# Patient Record
Sex: Male | Born: 1955 | Race: White | Hispanic: No | Marital: Married | Smoking: Former smoker
Health system: Southern US, Community
[De-identification: ages and names within clinical notes are randomized; demographics above are authoritative.]

## PROBLEM LIST (undated history)

## (undated) DIAGNOSIS — C189 Malignant neoplasm of colon, unspecified: Secondary | ICD-10-CM

## (undated) HISTORY — PX: ESOPHAGOGASTRODUODENOSCOPY: SHX1529

## (undated) HISTORY — DX: Malignant neoplasm of colon, unspecified: C18.9

---

## 2000-06-17 ENCOUNTER — Inpatient Hospital Stay (HOSPITAL_COMMUNITY): Admission: EM | Admit: 2000-06-17 | Discharge: 2000-06-20 | Payer: Self-pay | Admitting: *Deleted

## 2000-09-05 ENCOUNTER — Ambulatory Visit (HOSPITAL_BASED_OUTPATIENT_CLINIC_OR_DEPARTMENT_OTHER): Admission: RE | Admit: 2000-09-05 | Discharge: 2000-09-05 | Payer: Self-pay | Admitting: *Deleted

## 2001-08-07 ENCOUNTER — Emergency Department (HOSPITAL_COMMUNITY): Admission: EM | Admit: 2001-08-07 | Discharge: 2001-08-07 | Payer: Self-pay

## 2014-07-25 ENCOUNTER — Emergency Department (HOSPITAL_BASED_OUTPATIENT_CLINIC_OR_DEPARTMENT_OTHER): Payer: Self-pay

## 2014-07-25 ENCOUNTER — Emergency Department (HOSPITAL_BASED_OUTPATIENT_CLINIC_OR_DEPARTMENT_OTHER)
Admission: EM | Admit: 2014-07-25 | Discharge: 2014-07-25 | Disposition: A | Discharge status: Home / Self Care | Payer: Self-pay | Attending: Emergency Medicine | Admitting: Emergency Medicine

## 2014-07-25 ENCOUNTER — Encounter (HOSPITAL_BASED_OUTPATIENT_CLINIC_OR_DEPARTMENT_OTHER): Payer: Self-pay

## 2014-07-25 DIAGNOSIS — E785 Hyperlipidemia, unspecified: Secondary | ICD-10-CM | POA: Insufficient documentation

## 2014-07-25 DIAGNOSIS — D649 Anemia, unspecified: Secondary | ICD-10-CM

## 2014-07-25 DIAGNOSIS — M79621 Pain in right upper arm: Secondary | ICD-10-CM

## 2014-07-25 DIAGNOSIS — Z79899 Other long term (current) drug therapy: Secondary | ICD-10-CM | POA: Insufficient documentation

## 2014-07-25 DIAGNOSIS — R52 Pain, unspecified: Secondary | ICD-10-CM

## 2014-07-25 DIAGNOSIS — I10 Essential (primary) hypertension: Secondary | ICD-10-CM | POA: Insufficient documentation

## 2014-07-25 DIAGNOSIS — F419 Anxiety disorder, unspecified: Secondary | ICD-10-CM | POA: Insufficient documentation

## 2014-07-25 DIAGNOSIS — Z720 Tobacco use: Secondary | ICD-10-CM | POA: Insufficient documentation

## 2014-07-25 LAB — HEPATIC FUNCTION PANEL
ALK PHOS: 99 U/L (ref 39–117)
ALT: 11 U/L (ref 0–53)
AST: 17 U/L (ref 0–37)
Albumin: 3.9 g/dL (ref 3.5–5.2)
Total Bilirubin: 0.4 mg/dL (ref 0.3–1.2)
Total Protein: 7.5 g/dL (ref 6.0–8.3)

## 2014-07-25 LAB — CBC WITH DIFFERENTIAL/PLATELET
BASOS PCT: 0 % (ref 0–1)
Basophils Absolute: 0.1 10*3/uL (ref 0.0–0.1)
Eosinophils Absolute: 0.2 10*3/uL (ref 0.0–0.7)
Eosinophils Relative: 2 % (ref 0–5)
HCT: 27.2 % — ABNORMAL LOW (ref 39.0–52.0)
Hemoglobin: 8.2 g/dL — ABNORMAL LOW (ref 13.0–17.0)
LYMPHS ABS: 2 10*3/uL (ref 0.7–4.0)
Lymphocytes Relative: 17 % (ref 12–46)
MCH: 23.4 pg — AB (ref 26.0–34.0)
MCHC: 30.1 g/dL (ref 30.0–36.0)
MCV: 77.5 fL — AB (ref 78.0–100.0)
MONOS PCT: 9 % (ref 3–12)
Monocytes Absolute: 1 10*3/uL (ref 0.1–1.0)
Neutro Abs: 8.4 10*3/uL — ABNORMAL HIGH (ref 1.7–7.7)
Neutrophils Relative %: 72 % (ref 43–77)
Platelets: 483 10*3/uL — ABNORMAL HIGH (ref 150–400)
RBC: 3.51 MIL/uL — ABNORMAL LOW (ref 4.22–5.81)
RDW: 14.9 % (ref 11.5–15.5)
WBC: 11.6 10*3/uL — ABNORMAL HIGH (ref 4.0–10.5)

## 2014-07-25 LAB — BASIC METABOLIC PANEL
Anion gap: 7 (ref 5–15)
BUN: 10 mg/dL (ref 6–23)
CO2: 30 mmol/L (ref 19–32)
Calcium: 8.8 mg/dL (ref 8.4–10.5)
Chloride: 100 mmol/L (ref 96–112)
Creatinine, Ser: 0.73 mg/dL (ref 0.50–1.35)
GFR calc non Af Amer: >90 mL/min (ref >90)
GLUCOSE: 91 mg/dL (ref 70–99)
Potassium: 2.9 mmol/L — ABNORMAL LOW (ref 3.5–5.1)
SODIUM: 137 mmol/L (ref 135–145)

## 2014-07-25 LAB — BRAIN NATRIURETIC PEPTIDE: B Natriuretic Peptide: 93 pg/mL (ref 0.0–100.0)

## 2014-07-25 LAB — TROPONIN I: Troponin I: <0.03 ng/mL (ref <0.031)

## 2014-07-25 MED ORDER — PREDNISONE 50 MG PO TABS: 50.0000 mg | ORAL_TABLET | Freq: Every day | ORAL | Status: AC

## 2014-07-25 MED ORDER — TRAMADOL HCL 50 MG PO TABS: 50.0000 mg | ORAL_TABLET | Freq: Four times a day (QID) | ORAL | Status: DC | PRN

## 2014-07-25 MED ORDER — PREDNISONE 50 MG PO TABS
60.0000 mg | ORAL_TABLET | ORAL | Status: AC
  Administered 2014-07-25: 60 mg via ORAL
  Filled 2014-07-25 (×2): qty 1

## 2014-07-25 MED ORDER — CENTRUM PO CHEW: 1.0000 | CHEWABLE_TABLET | Freq: Every day | ORAL | Status: DC

## 2014-07-25 MED ORDER — POTASSIUM CHLORIDE CRYS ER 20 MEQ PO TBCR
40.0000 meq | EXTENDED_RELEASE_TABLET | Freq: Once | ORAL | Status: AC
  Administered 2014-07-25: 40 meq via ORAL
  Filled 2014-07-25: qty 2

## 2014-07-25 NOTE — ED Provider Notes (Signed)
CSN: [id number]     Arrival date & time 07/25/14  5462 History   First MD Initiated Contact with Patient 07/25/14 0805     Chief Complaint  Patient presents with  . Shortness of Breath     (Consider location/radiation/quality/duration/timing/severity/associated sxs/prior Treatment) HPI Patient presents with concern of ongoing right-sided chest pain.  Symptoms began slightly more than 1 week ago.  Since onset symptoms of been persistent. The pain is focally in the right inferior axilla, nonradiating. The pain is sharp, severe, worse with supine positioning, and also with motion. Mild associated dyspnea, though no near-syncope, syncope, other chest pain, fever, chills. Mild cough occasionally. Patient smokes, and I counseled the patient on the need for smoking cessation.  Patient denies other substantial medical issues. No relief with anything, no medication used thus far.  Past Medical History  Diagnosis Date  . Hypertension   . High cholesterol   . Anxiety    History reviewed. No pertinent past surgical history. No family history on file. History  Substance Use Topics  . Smoking status: Current Every Day Smoker -- 0.50 packs/day    Types: Cigarettes  . Smokeless tobacco: Not on file  . Alcohol Use: Not on file    Review of Systems  Constitutional:       Per HPI, otherwise negative  HENT:       Per HPI, otherwise negative  Respiratory:       Per HPI, otherwise negative  Cardiovascular:       Per HPI, otherwise negative  Gastrointestinal: Negative for vomiting.  Endocrine:       Negative aside from HPI  Genitourinary:       Neg aside from HPI   Musculoskeletal:       Per HPI, otherwise negative  Skin: Negative.   Neurological: Negative for syncope.      Allergies  Review of patient's allergies indicates not on file.  Home Medications   Prior to Admission medications   Medication Sig Start Date End Date Taking? Authorizing Provider  ALPRAZolam Duanne Moron) 1  MG tablet Take 1 mg by mouth 3 (three) times daily.   Yes Historical Provider, MD  atenolol-chlorthalidone (TENORETIC) 100-25 MG per tablet Take 1 tablet by mouth daily.   Yes Historical Provider, MD  lovastatin (MEVACOR) 40 MG tablet Take 40 mg by mouth at bedtime.   Yes Historical Provider, MD  multivitamin-iron-minerals-folic acid (CENTRUM) chewable tablet Chew 1 tablet by mouth daily.   Yes Historical Provider, MD   BP 134/74 mmHg  Pulse 62  Temp(Src) 97.7 F (36.5 C) (Oral)  Resp 18  Ht 5\' 9"  (1.753 m)  Wt 165 lb (74.844 kg)  BMI 24.36 kg/m2  SpO2 100% Physical Exam  Constitutional: He is oriented to person, place, and time. He appears well-developed. No distress.  HENT:  Head: Normocephalic and atraumatic.  Eyes: Conjunctivae and EOM are normal.  Cardiovascular: Normal rate and regular rhythm.   Pulmonary/Chest: Effort normal. No stridor. No respiratory distress.  Minimal chest wall tenderness on the right inferior axilla  Abdominal: He exhibits no distension.  Musculoskeletal: He exhibits no edema.  Neurological: He is alert and oriented to person, place, and time.  Skin: Skin is warm and dry.  Psychiatric: He has a normal mood and affect.  Nursing note and vitals reviewed.   ED Course  Procedures (including critical care time) Labs Review Labs Reviewed  BASIC METABOLIC PANEL - Abnormal; Notable for the following:    Potassium 2.9 (*)  All other components within normal limits  CBC WITH DIFFERENTIAL/PLATELET - Abnormal; Notable for the following:    WBC 11.6 (*)    RBC 3.51 (*)    Hemoglobin 8.2 (*)    HCT 27.2 (*)    MCV 77.5 (*)    MCH 23.4 (*)    Platelets 483 (*)    Neutro Abs 8.4 (*)    All other components within normal limits  BRAIN NATRIURETIC PEPTIDE  TROPONIN I  HEPATIC FUNCTION PANEL    Imaging Review Dg Chest 2 View  07/25/2014   CLINICAL DATA:  Acute shortness of breath with right chest and rib pain for 1 week. lifting injury.  EXAM: CHEST   2 VIEW  COMPARISON:  None.  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 Normal heart size and vascularity. Minor central bronchitic change without focal pneumonia, collapse or consolidation. No edema, effusion or pneumothorax. Trachea midline. No acute osseous finding.  IMPRESSION: Mild central bronchitic change.  Anterior eventration of the right hemidiaphragm  No superimposed acute process.   Electronically Signed   By: Jerilynn Mages.  Shick M.D.   On: 07/25/2014 09:03     EKG Interpretation   Date/Time:  Sunday July 25 2014 08:44:40 EST Ventricular Rate:  61 PR Interval:  148 QRS Duration: 106 QT Interval:  444 QTC Calculation: 446 R Axis:   72 Text Interpretation:  Normal sinus rhythm Normal ECG Sinus rhythm Normal  ECG Confirmed by Carmin Muskrat  MD ([id number]) on 07/25/2014 9:04:19 AM     Pulse oximetry 99% room air normal  10:12 AM Family not present. There was a specific concern of gallbladder pathology, point out that the patient has had pain in the right upper quadrant. Patient states that this pain has been present, slightly different from his initial history. Ultrasound will be performed.   I discussed all findings this far, including anemia, hypokalemia, concern for bronchitis with the patient and his family.  1:36 PM Patient in no distress. I discussed all findings with the patient and his wife.   Gallstones, but no gallbladder infection.  MDM  Patient presents initially with concern of dyspnea, right lower axillary pain. Here the patient is hemodynamically stable, labs are notable for anemia and hypokalemia. Ultrasound demonstrates gallstones, no cholecystitis. Patient had potassium repletion here, received analgesics,fluids. No evidence for heart failure, ACS, occult infection. Pain is secondary to bronchitis, though the cysts from the liver may also be causing some diaphragmatic irritation. Patient was discharged in stable condition to follow-up  with primary care after initiation of therapy for bronchitis, analgesics for his pain.    Carmin Muskrat, MD 07/25/14 1339

## 2014-07-25 NOTE — Discharge Instructions (Signed)
As discussed, today's evaluation has been somewhat reassuring. There is no evidence for life-threatening condition, but you should monitor your condition carefully, wall you arrange appropriate ongoing evaluation with your primary care physician. Several things were demonstrated on today's evaluation.  There is evidence for anemia, likely due to iron deficiency.  There is evidence for hypokalemia. (You should have your blood levels checked with your physician this week.)  There is evidence for bronchitis. ( please take all medication as directed, try to stop smoking.)  There is evidence for gallstones. ( please discussed this with your primary care physician).  Return here for concerning changes in your condition.

## 2014-07-25 NOTE — ED Notes (Signed)
Patient here with 1 week of right side pain points to ribs and shortness of breath with same. Reports that he smokes and denies trauma. No cough or congestion

## 2014-07-27 ENCOUNTER — Encounter: Payer: Self-pay | Admitting: Gastroenterology

## 2014-07-28 ENCOUNTER — Encounter (HOSPITAL_BASED_OUTPATIENT_CLINIC_OR_DEPARTMENT_OTHER): Payer: Self-pay | Admitting: *Deleted

## 2014-07-28 ENCOUNTER — Inpatient Hospital Stay (HOSPITAL_BASED_OUTPATIENT_CLINIC_OR_DEPARTMENT_OTHER)
Admission: EM | Admit: 2014-07-28 | Discharge: 2014-08-16 | DRG: 329 | Disposition: A | Discharge status: Home Health | Payer: Medicaid Other | Attending: Internal Medicine | Admitting: Internal Medicine

## 2014-07-28 ENCOUNTER — Emergency Department (HOSPITAL_BASED_OUTPATIENT_CLINIC_OR_DEPARTMENT_OTHER): Payer: Medicaid Other

## 2014-07-28 DIAGNOSIS — C7989 Secondary malignant neoplasm of other specified sites: Secondary | ICD-10-CM | POA: Diagnosis present

## 2014-07-28 DIAGNOSIS — Z7952 Long term (current) use of systemic steroids: Secondary | ICD-10-CM | POA: Diagnosis not present

## 2014-07-28 DIAGNOSIS — I10 Essential (primary) hypertension: Secondary | ICD-10-CM | POA: Diagnosis present

## 2014-07-28 DIAGNOSIS — K921 Melena: Secondary | ICD-10-CM | POA: Diagnosis not present

## 2014-07-28 DIAGNOSIS — R609 Edema, unspecified: Secondary | ICD-10-CM | POA: Diagnosis present

## 2014-07-28 DIAGNOSIS — Z803 Family history of malignant neoplasm of breast: Secondary | ICD-10-CM

## 2014-07-28 DIAGNOSIS — D62 Acute posthemorrhagic anemia: Secondary | ICD-10-CM | POA: Diagnosis present

## 2014-07-28 DIAGNOSIS — K838 Other specified diseases of biliary tract: Secondary | ICD-10-CM | POA: Diagnosis not present

## 2014-07-28 DIAGNOSIS — F17210 Nicotine dependence, cigarettes, uncomplicated: Secondary | ICD-10-CM | POA: Diagnosis present

## 2014-07-28 DIAGNOSIS — R1011 Right upper quadrant pain: Secondary | ICD-10-CM | POA: Diagnosis present

## 2014-07-28 DIAGNOSIS — Z79891 Long term (current) use of opiate analgesic: Secondary | ICD-10-CM

## 2014-07-28 DIAGNOSIS — Z6824 Body mass index (BMI) 24.0-24.9, adult: Secondary | ICD-10-CM | POA: Diagnosis not present

## 2014-07-28 DIAGNOSIS — C772 Secondary and unspecified malignant neoplasm of intra-abdominal lymph nodes: Secondary | ICD-10-CM | POA: Diagnosis present

## 2014-07-28 DIAGNOSIS — Z720 Tobacco use: Secondary | ICD-10-CM

## 2014-07-28 DIAGNOSIS — Z800 Family history of malignant neoplasm of digestive organs: Secondary | ICD-10-CM

## 2014-07-28 DIAGNOSIS — Z801 Family history of malignant neoplasm of trachea, bronchus and lung: Secondary | ICD-10-CM

## 2014-07-28 DIAGNOSIS — E876 Hypokalemia: Secondary | ICD-10-CM | POA: Diagnosis not present

## 2014-07-28 DIAGNOSIS — J189 Pneumonia, unspecified organism: Secondary | ICD-10-CM | POA: Diagnosis not present

## 2014-07-28 DIAGNOSIS — K859 Acute pancreatitis, unspecified: Secondary | ICD-10-CM | POA: Diagnosis not present

## 2014-07-28 DIAGNOSIS — K5730 Diverticulosis of large intestine without perforation or abscess without bleeding: Secondary | ICD-10-CM | POA: Diagnosis present

## 2014-07-28 DIAGNOSIS — R109 Unspecified abdominal pain: Secondary | ICD-10-CM

## 2014-07-28 DIAGNOSIS — D509 Iron deficiency anemia, unspecified: Secondary | ICD-10-CM | POA: Diagnosis not present

## 2014-07-28 DIAGNOSIS — J449 Chronic obstructive pulmonary disease, unspecified: Secondary | ICD-10-CM | POA: Diagnosis present

## 2014-07-28 DIAGNOSIS — J90 Pleural effusion, not elsewhere classified: Secondary | ICD-10-CM | POA: Diagnosis not present

## 2014-07-28 DIAGNOSIS — K922 Gastrointestinal hemorrhage, unspecified: Secondary | ICD-10-CM | POA: Diagnosis not present

## 2014-07-28 DIAGNOSIS — R0600 Dyspnea, unspecified: Secondary | ICD-10-CM | POA: Diagnosis not present

## 2014-07-28 DIAGNOSIS — J9601 Acute respiratory failure with hypoxia: Secondary | ICD-10-CM | POA: Diagnosis not present

## 2014-07-28 DIAGNOSIS — C189 Malignant neoplasm of colon, unspecified: Secondary | ICD-10-CM

## 2014-07-28 DIAGNOSIS — F329 Major depressive disorder, single episode, unspecified: Secondary | ICD-10-CM | POA: Diagnosis present

## 2014-07-28 DIAGNOSIS — E780 Pure hypercholesterolemia: Secondary | ICD-10-CM | POA: Diagnosis present

## 2014-07-28 DIAGNOSIS — R509 Fever, unspecified: Secondary | ICD-10-CM | POA: Diagnosis not present

## 2014-07-28 DIAGNOSIS — Z7982 Long term (current) use of aspirin: Secondary | ICD-10-CM | POA: Diagnosis not present

## 2014-07-28 DIAGNOSIS — K839 Disease of biliary tract, unspecified: Secondary | ICD-10-CM | POA: Diagnosis not present

## 2014-07-28 DIAGNOSIS — C183 Malignant neoplasm of hepatic flexure: Secondary | ICD-10-CM | POA: Diagnosis not present

## 2014-07-28 DIAGNOSIS — K8020 Calculus of gallbladder without cholecystitis without obstruction: Secondary | ICD-10-CM | POA: Diagnosis present

## 2014-07-28 DIAGNOSIS — D500 Iron deficiency anemia secondary to blood loss (chronic): Secondary | ICD-10-CM | POA: Diagnosis present

## 2014-07-28 DIAGNOSIS — C779 Secondary and unspecified malignant neoplasm of lymph node, unspecified: Secondary | ICD-10-CM | POA: Diagnosis not present

## 2014-07-28 DIAGNOSIS — Z452 Encounter for adjustment and management of vascular access device: Secondary | ICD-10-CM

## 2014-07-28 DIAGNOSIS — K929 Disease of digestive system, unspecified: Secondary | ICD-10-CM | POA: Diagnosis not present

## 2014-07-28 DIAGNOSIS — F411 Generalized anxiety disorder: Secondary | ICD-10-CM | POA: Diagnosis not present

## 2014-07-28 DIAGNOSIS — Y95 Nosocomial condition: Secondary | ICD-10-CM | POA: Diagnosis not present

## 2014-07-28 DIAGNOSIS — C184 Malignant neoplasm of transverse colon: Secondary | ICD-10-CM | POA: Diagnosis present

## 2014-07-28 DIAGNOSIS — J9801 Acute bronchospasm: Secondary | ICD-10-CM | POA: Diagnosis not present

## 2014-07-28 DIAGNOSIS — K9189 Other postprocedural complications and disorders of digestive system: Secondary | ICD-10-CM

## 2014-07-28 DIAGNOSIS — Z79899 Other long term (current) drug therapy: Secondary | ICD-10-CM | POA: Diagnosis not present

## 2014-07-28 DIAGNOSIS — E46 Unspecified protein-calorie malnutrition: Secondary | ICD-10-CM | POA: Diagnosis present

## 2014-07-28 DIAGNOSIS — R1030 Lower abdominal pain, unspecified: Secondary | ICD-10-CM

## 2014-07-28 DIAGNOSIS — Z95828 Presence of other vascular implants and grafts: Secondary | ICD-10-CM

## 2014-07-28 DIAGNOSIS — E785 Hyperlipidemia, unspecified: Secondary | ICD-10-CM | POA: Diagnosis present

## 2014-07-28 DIAGNOSIS — K6389 Other specified diseases of intestine: Secondary | ICD-10-CM | POA: Diagnosis not present

## 2014-07-28 DIAGNOSIS — K769 Liver disease, unspecified: Secondary | ICD-10-CM | POA: Diagnosis not present

## 2014-07-28 LAB — CBC WITH DIFFERENTIAL/PLATELET
Basophils Absolute: 0.1 10*3/uL (ref 0.0–0.1)
Basophils Relative: 0 % (ref 0–1)
EOS ABS: 0.1 10*3/uL (ref 0.0–0.7)
EOS PCT: 1 % (ref 0–5)
HCT: 26.8 % — ABNORMAL LOW (ref 39.0–52.0)
Hemoglobin: 7.9 g/dL — ABNORMAL LOW (ref 13.0–17.0)
Lymphocytes Relative: 9 % — ABNORMAL LOW (ref 12–46)
Lymphs Abs: 1.6 10*3/uL (ref 0.7–4.0)
MCH: 23.1 pg — ABNORMAL LOW (ref 26.0–34.0)
MCHC: 29.5 g/dL — ABNORMAL LOW (ref 30.0–36.0)
MCV: 78.4 fL (ref 78.0–100.0)
MONO ABS: 1.3 10*3/uL — AB (ref 0.1–1.0)
MONOS PCT: 7 % (ref 3–12)
Neutro Abs: 15.6 10*3/uL — ABNORMAL HIGH (ref 1.7–7.7)
Neutrophils Relative %: 83 % — ABNORMAL HIGH (ref 43–77)
PLATELETS: 526 10*3/uL — AB (ref 150–400)
RBC: 3.42 MIL/uL — ABNORMAL LOW (ref 4.22–5.81)
RDW: 15.6 % — ABNORMAL HIGH (ref 11.5–15.5)
WBC: 18.7 10*3/uL — ABNORMAL HIGH (ref 4.0–10.5)

## 2014-07-28 LAB — COMPREHENSIVE METABOLIC PANEL
ALT: 10 U/L (ref 0–53)
AST: 16 U/L (ref 0–37)
Albumin: 3.4 g/dL — ABNORMAL LOW (ref 3.5–5.2)
Alkaline Phosphatase: 78 U/L (ref 39–117)
Anion gap: 5 (ref 5–15)
BILIRUBIN TOTAL: 0.3 mg/dL (ref 0.3–1.2)
BUN: 17 mg/dL (ref 6–23)
CALCIUM: 8.5 mg/dL (ref 8.4–10.5)
CO2: 31 mmol/L (ref 19–32)
Chloride: 100 mmol/L (ref 96–112)
Creatinine, Ser: 0.7 mg/dL (ref 0.50–1.35)
GFR calc non Af Amer: >90 mL/min (ref >90)
Glucose, Bld: 103 mg/dL — ABNORMAL HIGH (ref 70–99)
Potassium: 3.1 mmol/L — ABNORMAL LOW (ref 3.5–5.1)
Sodium: 136 mmol/L (ref 135–145)
Total Protein: 6.7 g/dL (ref 6.0–8.3)

## 2014-07-28 LAB — URINALYSIS, ROUTINE W REFLEX MICROSCOPIC
Bilirubin Urine: NEGATIVE
Glucose, UA: NEGATIVE mg/dL
HGB URINE DIPSTICK: NEGATIVE
Ketones, ur: NEGATIVE mg/dL
Leukocytes, UA: NEGATIVE
Nitrite: NEGATIVE
PH: 7 (ref 5.0–8.0)
Protein, ur: NEGATIVE mg/dL
Specific Gravity, Urine: 1.006 (ref 1.005–1.030)
Urobilinogen, UA: 0.2 mg/dL (ref 0.0–1.0)

## 2014-07-28 LAB — ABO/RH: ABO/RH(D): A POS

## 2014-07-28 LAB — LIPASE, BLOOD: LIPASE: 20 U/L (ref 11–59)

## 2014-07-28 LAB — OCCULT BLOOD X 1 CARD TO LAB, STOOL: FECAL OCCULT BLD: POSITIVE — AB

## 2014-07-28 LAB — PREPARE RBC (CROSSMATCH)

## 2014-07-28 MED ORDER — SODIUM CHLORIDE 0.9 % IV SOLN
8.0000 mg/h | INTRAVENOUS | Status: DC
  Administered 2014-07-28: 8 mg/h via INTRAVENOUS

## 2014-07-28 MED ORDER — POTASSIUM CHLORIDE CRYS ER 20 MEQ PO TBCR
40.0000 meq | EXTENDED_RELEASE_TABLET | Freq: Once | ORAL | Status: AC
  Administered 2014-07-28: 40 meq via ORAL
  Filled 2014-07-28: qty 2

## 2014-07-28 MED ORDER — SODIUM CHLORIDE 0.9 % IV SOLN: 80.0000 mg | Freq: Once | INTRAVENOUS | Status: DC

## 2014-07-28 MED ORDER — PANTOPRAZOLE SODIUM 40 MG IV SOLR
INTRAVENOUS | Status: AC
  Filled 2014-07-28: qty 80

## 2014-07-28 MED ORDER — IOHEXOL 300 MG/ML  SOLN
100.0000 mL | Freq: Once | INTRAMUSCULAR | Status: AC | PRN
  Administered 2014-07-28: 100 mL via INTRAVENOUS

## 2014-07-28 MED ORDER — ATENOLOL 25 MG PO TABS
100.0000 mg | ORAL_TABLET | Freq: Every day | ORAL | Status: DC
  Filled 2014-07-28 (×2): qty 1

## 2014-07-28 MED ORDER — FERROUS SULFATE 325 (65 FE) MG PO TABS
325.0000 mg | ORAL_TABLET | Freq: Every day | ORAL | Status: DC
  Filled 2014-07-28 (×3): qty 1

## 2014-07-28 MED ORDER — SODIUM CHLORIDE 0.9 % IV SOLN
Freq: Once | INTRAVENOUS | Status: DC
Start: 2014-07-28 — End: 2014-07-30

## 2014-07-28 MED ORDER — SODIUM CHLORIDE 0.9 % IV BOLUS (SEPSIS)
1000.0000 mL | Freq: Once | INTRAVENOUS | Status: AC
  Administered 2014-07-28: 1000 mL via INTRAVENOUS

## 2014-07-28 MED ORDER — NICOTINE 21 MG/24HR TD PT24
21.0000 mg | MEDICATED_PATCH | Freq: Every day | TRANSDERMAL | Status: DC
  Administered 2014-07-28 – 2014-07-30 (×2): 21 mg via TRANSDERMAL
  Filled 2014-07-28 (×3): qty 1

## 2014-07-28 MED ORDER — IOHEXOL 300 MG/ML  SOLN
25.0000 mL | Freq: Once | INTRAMUSCULAR | Status: AC | PRN
  Administered 2014-07-28: 25 mL via ORAL

## 2014-07-28 MED ORDER — PEG-KCL-NACL-NASULF-NA ASC-C 100 G PO SOLR
0.5000 | Freq: Once | ORAL | Status: AC
  Administered 2014-07-28: 100 g via ORAL
  Filled 2014-07-28: qty 1

## 2014-07-28 MED ORDER — PRAVASTATIN SODIUM 20 MG PO TABS
40.0000 mg | ORAL_TABLET | Freq: Every day | ORAL | Status: DC
  Administered 2014-07-29 (×2): 40 mg via ORAL
  Filled 2014-07-28 (×4): qty 1

## 2014-07-28 MED ORDER — METOCLOPRAMIDE HCL 5 MG/ML IJ SOLN
10.0000 mg | INTRAMUSCULAR | Status: AC
  Administered 2014-07-28: 10 mg via INTRAVENOUS
  Filled 2014-07-28: qty 2

## 2014-07-28 MED ORDER — PEG-KCL-NACL-NASULF-NA ASC-C 100 G PO SOLR: 1.0000 | Freq: Once | ORAL | Status: DC

## 2014-07-28 MED ORDER — TRAMADOL HCL 50 MG PO TABS
50.0000 mg | ORAL_TABLET | Freq: Four times a day (QID) | ORAL | Status: DC | PRN
Start: 2014-07-28 — End: 2014-07-30
  Administered 2014-07-29 (×4): 50 mg via ORAL
  Filled 2014-07-28 (×4): qty 1

## 2014-07-28 MED ORDER — PANTOPRAZOLE SODIUM 40 MG IV SOLR
INTRAVENOUS | Status: AC
  Administered 2014-07-28: 80 mg via INTRAVENOUS
  Filled 2014-07-28: qty 80

## 2014-07-28 MED ORDER — ALPRAZOLAM 1 MG PO TABS
1.0000 mg | ORAL_TABLET | Freq: Three times a day (TID) | ORAL | Status: DC | PRN
  Administered 2014-07-28 – 2014-07-29 (×4): 1 mg via ORAL
  Filled 2014-07-28 (×4): qty 1

## 2014-07-28 MED ORDER — PEG-KCL-NACL-NASULF-NA ASC-C 100 G PO SOLR
0.5000 | Freq: Once | ORAL | Status: AC
  Administered 2014-07-29: 100 g via ORAL
  Filled 2014-07-28: qty 1

## 2014-07-28 MED ORDER — METOCLOPRAMIDE HCL 5 MG/5ML PO SOLN
10.0000 mg | Freq: Once | ORAL | Status: AC
  Administered 2014-07-29: 10 mg via ORAL
  Filled 2014-07-28: qty 10

## 2014-07-28 NOTE — ED Notes (Signed)
Patient was here a few days ago and was dx with bronchitis and gallstones. Saw PCP this week as well, but states he continues to have pain in his right side and upper right abd.

## 2014-07-28 NOTE — ED Provider Notes (Signed)
CSN: [id number]     Arrival date & time 07/28/14  0724 History   First MD Initiated Contact with Patient 07/28/14 [phone]     Chief Complaint  Patient presents with  . Abdominal Pain     Patient is a 59 y.o. male presenting with abdominal pain. The history is provided by the patient. No language interpreter was used.  Abdominal Pain  Mr. Diltz presents for evaluation of right flank pain. He reports he's had a week and a half pain was right flank and right upper quadrant. The pain is described as a constant sharp pain that intermittently worse. The pain is worse with laying down and bending over as well as meals. He reports frequent belching and nausea. He denies any fevers, vomiting, diarrhea, melena or hematochezia, dysuria. He was seen in the emergency department 3 days ago and was diagnosed with anemia and gallstones. He followed up with his family doctor 2 days ago and had labs rechecked at that time. He presents today because the side pain is worsening. Symptoms are moderate, constant. He takes a baby aspirin daily. He was started on iron supplements 2 days ago.  Past Medical History  Diagnosis Date  . Hypertension   . High cholesterol   . Anxiety    History reviewed. No pertinent past surgical history. No family history on file. History  Substance Use Topics  . Smoking status: Current Every Day Smoker -- 0.50 packs/day    Types: Cigarettes  . Smokeless tobacco: Not on file  . Alcohol Use: Not on file    Review of Systems  Gastrointestinal: Positive for abdominal pain.  All other systems reviewed and are negative.     Allergies  Review of patient's allergies indicates no known allergies.  Home Medications   Prior to Admission medications   Medication Sig Start Date End Date Taking? Authorizing Provider  atenolol (TENORMIN) 100 MG tablet Take 100 mg by mouth daily.   Yes Historical Provider, MD  ferrous sulfate 325 (65 FE) MG tablet Take 325 mg by mouth daily with  breakfast.   Yes Historical Provider, MD  ALPRAZolam (XANAX) 1 MG tablet Take 1 mg by mouth 3 (three) times daily.    Historical Provider, MD  atenolol-chlorthalidone (TENORETIC) 100-25 MG per tablet Take 1 tablet by mouth daily.    Historical Provider, MD  lovastatin (MEVACOR) 40 MG tablet Take 40 mg by mouth at bedtime.    Historical Provider, MD  multivitamin-iron-minerals-folic acid (CENTRUM) chewable tablet Chew 1 tablet by mouth daily. 07/25/14   Carmin Muskrat, MD  predniSONE (DELTASONE) 50 MG tablet Take 1 tablet (50 mg total) by mouth daily. 07/26/14 07/28/14  Carmin Muskrat, MD  traMADol (ULTRAM) 50 MG tablet Take 1 tablet (50 mg total) by mouth every 6 (six) hours as needed for severe pain. 07/25/14   Carmin Muskrat, MD   BP 122/61 mmHg  Pulse 62  Temp(Src) 97.5 F (36.4 C) (Oral)  Resp 16  Ht 5\' 9"  (1.753 m)  Wt 163 lb (73.936 kg)  BMI 24.06 kg/m2  SpO2 99% Physical Exam  Constitutional: He is oriented to person, place, and time. He appears well-developed and well-nourished.  HENT:  Head: Normocephalic and atraumatic.  Cardiovascular: Normal rate and regular rhythm.   No murmur heard. Pulmonary/Chest: Effort normal and breath sounds normal. No respiratory distress.  Abdominal: Soft. There is no rebound and no guarding.  Mild to moderate right upper quadrant and right side tenderness without guarding or rebound. Negative Murphy's.  Genitourinary:  Rectal exam is nontender with very dark stool and mucus mixed in. There is no gross blood.  Musculoskeletal: He exhibits no edema or tenderness.  Neurological: He is alert and oriented to person, place, and time.  Skin: Skin is warm and dry.  Psychiatric: He has a normal mood and affect. His behavior is normal.  Nursing note and vitals reviewed.   ED Course  Procedures (including critical care time) Labs Review Labs Reviewed  CBC WITH DIFFERENTIAL/PLATELET - Abnormal; Notable for the following:    WBC 18.7 (*)    RBC 3.42  (*)    Hemoglobin 7.9 (*)    HCT 26.8 (*)    MCH 23.1 (*)    MCHC 29.5 (*)    RDW 15.6 (*)    Platelets 526 (*)    Neutrophils Relative % 83 (*)    Neutro Abs 15.6 (*)    Lymphocytes Relative 9 (*)    Monocytes Absolute 1.3 (*)    All other components within normal limits  COMPREHENSIVE METABOLIC PANEL - Abnormal; Notable for the following:    Potassium 3.1 (*)    Glucose, Bld 103 (*)    Albumin 3.4 (*)    All other components within normal limits  OCCULT BLOOD X 1 CARD TO LAB, STOOL - Abnormal; Notable for the following:    Fecal Occult Bld POSITIVE (*)    All other components within normal limits  CBC WITH DIFFERENTIAL/PLATELET  URINALYSIS, ROUTINE W REFLEX MICROSCOPIC  LIPASE, BLOOD    Imaging Review Ct Abdomen Pelvis W Contrast  07/28/2014   ADDENDUM REPORT: 07/28/2014 11:16  ADDENDUM: Study discussed by telephone with Dr. Quintella Reichert on 07/28/2014 at 1105 hours.   Electronically Signed   By: Genevie Ann M.D.   On: 07/28/2014 11:16   07/28/2014   CLINICAL DATA:  59 year old male with 2 weeks of right side abdominal pain. Recently diagnosed with gallstones. Initial encounter.  EXAM: CT ABDOMEN AND PELVIS WITH CONTRAST  TECHNIQUE: Multidetector CT imaging of the abdomen and pelvis was performed using the standard protocol following bolus administration of intravenous contrast.  CONTRAST:  89mL OMNIPAQUE IOHEXOL 300 MG/ML SOLN, 14mL OMNIPAQUE IOHEXOL 300 MG/ML SOLN, 170mL OMNIPAQUE IOHEXOL 300 MG/ML SOLN  COMPARISON:  Abdomen ultrasound 07/25/2014.  FINDINGS: Minor atelectasis at the right lung base. No pericardial or pleural effusion.  No acute osseous abnormality identified.  No pelvic free fluid. Retained stool in the rectum. Negative bladder. Moderately redundant sigmoid colon with retained stool. Negative left colon. Moderately redundant splenic flexure.  Abnormal hepatic flexure with elongated circumferential wall thickening and luminal narrowing, but also extensive surrounding  hepatic flexure mesentery nodularity and irregular intermediate density. Discrete rounded adjacent mesenteric nodes measuring up to 9 mm diameter are present.  The abnormal pericolic fat continues to the gallbladder fossa. However, the gallbladder itself does not appear distended or definitely inflamed. There is cholelithiasis evident at the gallbladder fundus.  The upstream ascending colon and cecum than have a more normal appearance. There is a gas-filled laterally situated appendix which tracks cephalad. The terminal ileum is within normal limits. Oral contrast has not yet reached the distal small bowel. No dilated small bowel loops. Negative stomach. The second portion of the duodenum also is in close proximity to the abnormal hepatic flexure.  Trace perihepatic free fluid. Multiple nonspecific small low-density lesions in liver such as the 14 mm right hepatic lobe entity on series 2, image 27. Also 8 mm lesion in the left lobe on  image 27. Larger 14 mm lesion in the lateral left lobe on image 33.  Spleen, pancreas, right adrenal gland, portal venous system, and kidneys are within normal limits.  There is intermediate density nodularity adjacent to the medial lobe of the left adrenal gland which is nonspecific. To a degree this resembles an enlarged retroperitoneal lymph node. There is a similar prominent node situated between the cava and right corona of the diaphragm measuring 6 mm.  No pneumoperitoneum. Aortoiliac calcified atherosclerosis noted. Major arterial structures in the abdomen and pelvis are patent.  IMPRESSION: 1. Markedly abnormal hepatic flexure with wall thickening along a 10-12 cm length but extensive surrounding mesenteric nodularity, and increased regional lymph nodes. The appearance is atypical for acute colitis and highly suspicious for adenocarcinoma. GI consultation for followup colonoscopy recommended. 2. Small volume perihepatic fluid. Multiple up to 14 mm indeterminate low-density  lesions in the liver. Several prominent but indeterminate retroperitoneal lymph nodes in the upper abdomen. 3. Abnormal fat stranding/nodularity continues to the gallbladder fossa, but the gallbladder does not appear inflamed.  Electronically Signed: By: Genevie Ann M.D. On: 07/28/2014 11:00     EKG Interpretation None      MDM   Final diagnoses:  Lower GI bleed  Right sided abdominal pain    Patient here for evaluation of right-sided abdominal pain, recently diagnosed with gallstones and anemia. Patient does have some tenderness over the right side of the abdomen the clinical picture is not consistent with acute cholecystitis. CT scan is concerning for colon mass. Discussed with gastroenterologist, Dr. Fuller Plan recommends transfer to [facility] long for further evaluation. Discussed with hospitalist regarding admission for further management. Updated patient and family of the findings of lab results and CT scan and need for further workup. Protonix drip discontinued after CT scan findings were made available.    Quintella Reichert, MD 07/28/14 [phone]

## 2014-07-28 NOTE — ED Notes (Signed)
Patient transported to and from radiology. 

## 2014-07-28 NOTE — ED Notes (Signed)
Pt was allowed liquids per EDP-given 2nd diet coke per pt request

## 2014-07-28 NOTE — ED Notes (Signed)
Pt transported to [facility] with technician.

## 2014-07-28 NOTE — ED Notes (Signed)
Pt given Diet Coke per [place] of MD Ralene Bathe

## 2014-07-28 NOTE — H&P (Signed)
History and Physical    Donald Fry VEH:[id number] DOB: 1955/06/17 DOA: 07/28/2014  Referring physician: Dr. Ayesha Rumpf PCP: [name], MD  Specialists: GI, Dr. Fuller Plan  Chief Complaint: Right upper quadrant pain  HPI: Donald Fry is a 59 y.o. male has a past medical history significant for hypertension, hyperlipidemia, tobacco abuse, presents to the emergency room with a chief complaint of right upper quadrant pain. Patient has been having pain on the right side for number of days, he was evaluated in the emergency 3 days ago, and that time a right upper quadrant ultrasound showed cholelithiasis without evidence of cholecystitis. He was found to be mildly anemic with a hemoglobin of 8.2. Patient was sent to follow-up with his primary care provider, which she did, and had a repeat hemoglobin which was even lower. Patient has been having intermittent right upper quadrant abdominal pain which is persistent, and in the light of his new anemia, he presented back to the emergency room. He underwent a CT scan of the abdomen and pelvis which was concerning for malignancy near the hepatic flexure, with surrounding lymphadenopathy. His hemoglobin was found to be even lower at 7.9, and he was admitted to the hospital for further workup. Patient currently denies any chest pain or shortness of breath, however in the last few days he has been having increased dyspnea with exertion and fatigue with little activities. He denies any nausea or vomiting. He denies any diarrhea, denies any blood in his stools, denies any black tarry stools. He denies any fever or chills, no cough or chest congestion. He denies any significant weight loss, he has been losing about 4 pounds since last November, however he blames it on heavy physical activity.  Review of Systems: As per history of present illness, otherwise 10 point review of system negative  Past Medical History  Diagnosis Date  . Hypertension   . High cholesterol    . Anxiety    History reviewed. No pertinent past surgical history. Social History:  reports that he has been smoking Cigarettes.  He has been smoking about 0.50 packs per day. He has never used smokeless tobacco. He reports that he does not drink alcohol or use illicit drugs.  No Known Allergies  Family history positive for pancreatic cancer, lung cancer in the setting of smoking, breast cancer, however no history of colon cancer  Prior to Admission medications   Medication Sig Start Date End Date Taking? Authorizing Provider  ALPRAZolam Duanne Moron) 1 MG tablet Take 1 mg by mouth 3 (three) times daily.   Yes Historical Provider, MD  aspirin 81 MG tablet Take 81 mg by mouth daily.   Yes Historical Provider, MD  atenolol (TENORMIN) 100 MG tablet Take 100 mg by mouth daily.   Yes Historical Provider, MD  ferrous sulfate 325 (65 FE) MG tablet Take 325 mg by mouth daily with breakfast.   Yes Historical Provider, MD  lovastatin (MEVACOR) 40 MG tablet Take 40 mg by mouth at bedtime.   Yes Historical Provider, MD  multivitamin-iron-minerals-folic acid (CENTRUM) chewable tablet Chew 1 tablet by mouth daily. 07/25/14  Yes Carmin Muskrat, MD  traMADol (ULTRAM) 50 MG tablet Take 1 tablet (50 mg total) by mouth every 6 (six) hours as needed for severe pain. 07/25/14  Yes Carmin Muskrat, MD  atenolol-chlorthalidone (TENORETIC) 100-25 MG per tablet Take 1 tablet by mouth daily.    Historical Provider, MD  predniSONE (DELTASONE) 50 MG tablet Take 1 tablet (50 mg total) by mouth  daily. 07/26/14 07/28/14  Carmin Muskrat, MD   Physical Exam: Filed Vitals:   07/28/14 1147 07/28/14 1402 07/28/14 1535 07/28/14 1615  BP: 137/65 112/69 134/72 124/66  Pulse: 58 62 63 57  Temp:    98.6 F (37 C)  TempSrc:      Resp:  18 16 18   Height:      Weight:      SpO2: 98% 98% 94% 97%     General:  No apparent distress, pleasant Caucasian male  Eyes: PERRL, EOMI, no scleral icterus  ENT: moist oropharynx  Neck:  supple, no lymphadenopathy  Cardiovascular: regular rate without MRG; 2+ peripheral pulses, no JVD, no peripheral edema  Respiratory: CTA biL, good air movement without wheezing, rhonchi or crackled  Abdomen: soft, tender to palpation RUQ   Skin: no rashes  Musculoskeletal: normal bulk and tone, no joint swelling  Psychiatric: normal mood and affect  Neurologic: non focal  Labs on Admission:  Basic Metabolic Panel:  Recent Labs Lab 07/25/14 0850 07/28/14 0830  NA 137 136  K 2.9* 3.1*  CL 100 100  CO2 30 31  GLUCOSE 91 103*  BUN 10 17  CREATININE 0.73 0.70  CALCIUM 8.8 8.5   Liver Function Tests:  Recent Labs Lab 07/25/14 0850 07/28/14 0830  AST 17 16  ALT 11 10  ALKPHOS 99 78  BILITOT 0.4 0.3  PROT 7.5 6.7  ALBUMIN 3.9 3.4*    Recent Labs Lab 07/28/14 0830  LIPASE 20   CBC:  Recent Labs Lab 07/25/14 0850 07/28/14 0755 07/28/14 0830  WBC 11.6* SPECIMEN CLOTTED 18.7*  NEUTROABS 8.4* PENDING 15.6*  HGB 8.2* SPECIMEN CLOTTED 7.9*  HCT 27.2* SPECIMEN CLOTTED 26.8*  MCV 77.5* SPECIMEN CLOTTED 78.4  PLT 483* SPECIMEN CLOTTED 526*   Cardiac Enzymes:  Recent Labs Lab 07/25/14 0850  TROPONINI <0.03    BNP (last 3 results)  Recent Labs  07/25/14 0850  BNP 93.0    ProBNP (last 3 results) No results for input(s): PROBNP in the last 8760 hours.  CBG: No results for input(s): GLUCAP in the last 168 hours.  Radiological Exams on Admission: Ct Abdomen Pelvis W Contrast  07/28/2014   ADDENDUM REPORT: 07/28/2014 11:16  ADDENDUM: Study discussed by telephone with Dr. Quintella Reichert on 07/28/2014 at 1105 hours.   Electronically Signed   By: Genevie Ann M.D.   On: 07/28/2014 11:16   07/28/2014   CLINICAL DATA:  59 year old male with 2 weeks of right side abdominal pain. Recently diagnosed with gallstones. Initial encounter.  EXAM: CT ABDOMEN AND PELVIS WITH CONTRAST  TECHNIQUE: Multidetector CT imaging of the abdomen and pelvis was performed using the  standard protocol following bolus administration of intravenous contrast.  CONTRAST:  53mL OMNIPAQUE IOHEXOL 300 MG/ML SOLN, 83mL OMNIPAQUE IOHEXOL 300 MG/ML SOLN, 132mL OMNIPAQUE IOHEXOL 300 MG/ML SOLN  COMPARISON:  Abdomen ultrasound 07/25/2014.  FINDINGS: Minor atelectasis at the right lung base. No pericardial or pleural effusion.  No acute osseous abnormality identified.  No pelvic free fluid. Retained stool in the rectum. Negative bladder. Moderately redundant sigmoid colon with retained stool. Negative left colon. Moderately redundant splenic flexure.  Abnormal hepatic flexure with elongated circumferential wall thickening and luminal narrowing, but also extensive surrounding hepatic flexure mesentery nodularity and irregular intermediate density. Discrete rounded adjacent mesenteric nodes measuring up to 9 mm diameter are present.  The abnormal pericolic fat continues to the gallbladder fossa. However, the gallbladder itself does not appear distended or definitely inflamed. There is cholelithiasis  evident at the gallbladder fundus.  The upstream ascending colon and cecum than have a more normal appearance. There is a gas-filled laterally situated appendix which tracks cephalad. The terminal ileum is within normal limits. Oral contrast has not yet reached the distal small bowel. No dilated small bowel loops. Negative stomach. The second portion of the duodenum also is in close proximity to the abnormal hepatic flexure.  Trace perihepatic free fluid. Multiple nonspecific small low-density lesions in liver such as the 14 mm right hepatic lobe entity on series 2, image 27. Also 8 mm lesion in the left lobe on image 27. Larger 14 mm lesion in the lateral left lobe on image 33.  Spleen, pancreas, right adrenal gland, portal venous system, and kidneys are within normal limits.  There is intermediate density nodularity adjacent to the medial lobe of the left adrenal gland which is nonspecific. To a degree this  resembles an enlarged retroperitoneal lymph node. There is a similar prominent node situated between the cava and right corona of the diaphragm measuring 6 mm.  No pneumoperitoneum. Aortoiliac calcified atherosclerosis noted. Major arterial structures in the abdomen and pelvis are patent.  IMPRESSION: 1. Markedly abnormal hepatic flexure with wall thickening along a 10-12 cm length but extensive surrounding mesenteric nodularity, and increased regional lymph nodes. The appearance is atypical for acute colitis and highly suspicious for adenocarcinoma. GI consultation for followup colonoscopy recommended. 2. Small volume perihepatic fluid. Multiple up to 14 mm indeterminate low-density lesions in the liver. Several prominent but indeterminate retroperitoneal lymph nodes in the upper abdomen. 3. Abnormal fat stranding/nodularity continues to the gallbladder fossa, but the gallbladder does not appear inflamed.  Electronically Signed: By: Genevie Ann M.D. On: 07/28/2014 11:00   Assessment/Plan Active Problems:   GI bleed   Essential hypertension   Hyperlipidemia   Anxiety state   Hypokalemia   Acute blood loss anemia   Tobacco abuse   GI bleed / blood loss anemia - this seems to be a more of an acute on chronic GI bleed with a CT finding probability for colon cancer and progressive symptoms. Patient's hemoglobin is 7.9, not a significant drop from 3 days ago, however he is symptomatic with fatigue and dyspnea on exertion, we'll go ahead and transfuse 1 unit - I have consulted gastroenterology, I talked to Dr. Fuller Plan over the phone, he will see patient in the morning and likely colonoscopy tomorrow, we'll make patient nothing by mouth after midnight, allow clear liquids now, start prepping tonight - Patient never had a colonoscopy or GI evaluation in the past  Hypertension - resume his home atenolol  Hyperlipidemia -resume home statin   Anxiety - resume home medications  Tobacco abuse - counseled for  cessation; nicotine patch  Hypokalemia - replete  Leukocytosis - he is afebrile, no apparent infection, stress response/inflammation more likely   Diet: Clear liquids, nothing by mouth after midnight  Fluids: None  DVT Prophylaxis: SCDs   Code Status: Presumed Full  Family Communication: d/w extended family bedside  Disposition Plan: inpatient  Time spent: 14  Naima Veldhuizen M. Cruzita Lederer, MD [facility] Pager [phone]  If 7PM-7AM, please contact night-coverage [email or web address] Password [facility] 07/28/2014, 4:58 PM

## 2014-07-29 ENCOUNTER — Encounter (HOSPITAL_COMMUNITY): Payer: Self-pay | Admitting: Gastroenterology

## 2014-07-29 ENCOUNTER — Encounter (HOSPITAL_COMMUNITY)
Admission: EM | Disposition: A | Discharge status: Home Health | Payer: Self-pay | Source: Home / Self Care | Attending: Internal Medicine

## 2014-07-29 DIAGNOSIS — E876 Hypokalemia: Secondary | ICD-10-CM

## 2014-07-29 DIAGNOSIS — K922 Gastrointestinal hemorrhage, unspecified: Secondary | ICD-10-CM

## 2014-07-29 DIAGNOSIS — R109 Unspecified abdominal pain: Secondary | ICD-10-CM

## 2014-07-29 HISTORY — PX: COLONOSCOPY: SHX5424

## 2014-07-29 LAB — BASIC METABOLIC PANEL
ANION GAP: 8 (ref 5–15)
BUN: 19 mg/dL (ref 6–23)
CHLORIDE: 100 mmol/L (ref 96–112)
CO2: 29 mmol/L (ref 19–32)
Calcium: 8.5 mg/dL (ref 8.4–10.5)
Creatinine, Ser: 0.68 mg/dL (ref 0.50–1.35)
GFR calc Af Amer: >90 mL/min (ref >90)
GFR calc non Af Amer: >90 mL/min (ref >90)
Glucose, Bld: 98 mg/dL (ref 70–99)
Potassium: 4 mmol/L (ref 3.5–5.1)
Sodium: 137 mmol/L (ref 135–145)

## 2014-07-29 LAB — CBC
HEMATOCRIT: 27.5 % — AB (ref 39.0–52.0)
HEMOGLOBIN: 8 g/dL — AB (ref 13.0–17.0)
MCH: 23.2 pg — ABNORMAL LOW (ref 26.0–34.0)
MCHC: 29.1 g/dL — AB (ref 30.0–36.0)
MCV: 79.7 fL (ref 78.0–100.0)
Platelets: 488 10*3/uL — ABNORMAL HIGH (ref 150–400)
RBC: 3.45 MIL/uL — AB (ref 4.22–5.81)
RDW: 15.7 % — ABNORMAL HIGH (ref 11.5–15.5)
WBC: 15.5 10*3/uL — AB (ref 4.0–10.5)

## 2014-07-29 LAB — IRON AND TIBC
Iron: <10 ug/dL — ABNORMAL LOW (ref 42–165)
UIBC: 317 ug/dL (ref 125–400)

## 2014-07-29 LAB — PROTIME-INR
INR: 1.12 (ref 0.00–1.49)
Prothrombin Time: 14.5 seconds (ref 11.6–15.2)

## 2014-07-29 LAB — FERRITIN: FERRITIN: 8 ng/mL — AB (ref 22–322)

## 2014-07-29 SURGERY — COLONOSCOPY
Anesthesia: Moderate Sedation

## 2014-07-29 MED ORDER — MIDAZOLAM HCL 10 MG/2ML IJ SOLN
INTRAMUSCULAR | Status: AC
  Filled 2014-07-29: qty 2

## 2014-07-29 MED ORDER — MIDAZOLAM HCL 5 MG/5ML IJ SOLN
INTRAMUSCULAR | Status: DC | PRN
  Administered 2014-07-29 (×4): 2 mg via INTRAVENOUS

## 2014-07-29 MED ORDER — DIPHENHYDRAMINE HCL 50 MG/ML IJ SOLN
INTRAMUSCULAR | Status: DC | PRN
  Administered 2014-07-29 (×2): 25 mg via INTRAVENOUS

## 2014-07-29 MED ORDER — DIPHENHYDRAMINE HCL 50 MG/ML IJ SOLN
INTRAMUSCULAR | Status: AC
  Filled 2014-07-29: qty 1

## 2014-07-29 MED ORDER — PIPERACILLIN-TAZOBACTAM 3.375 G IVPB
3.3750 g | Freq: Three times a day (TID) | INTRAVENOUS | Status: DC
  Administered 2014-07-29 – 2014-07-30 (×3): 3.375 g via INTRAVENOUS
  Filled 2014-07-29 (×4): qty 50

## 2014-07-29 MED ORDER — PIPERACILLIN-TAZOBACTAM 3.375 G IVPB 30 MIN
3.3750 g | Freq: Once | INTRAVENOUS | Status: AC
  Administered 2014-07-29: 3.375 g via INTRAVENOUS
  Filled 2014-07-29: qty 50

## 2014-07-29 MED ORDER — FENTANYL CITRATE 0.05 MG/ML IJ SOLN
INTRAMUSCULAR | Status: AC
  Filled 2014-07-29: qty 2

## 2014-07-29 MED ORDER — FENTANYL CITRATE 0.05 MG/ML IJ SOLN
INTRAMUSCULAR | Status: DC | PRN
  Administered 2014-07-29 (×4): 25 ug via INTRAVENOUS

## 2014-07-29 NOTE — Op Note (Signed)
[facility] [place], [address]   COLONOSCOPY PROCEDURE REPORT  PATIENT: Donald Fry, Donald Fry  MR#: [id number] BIRTHDATE: 28-Jan-1956 , 58  yrs. old GENDER: male ENDOSCOPIST: Ladene Artist, MD, [facility] REFERRED NU:[id number] PROCEDURE DATE:  07/29/2014 PROCEDURE:   Colonoscopy with biopsy First Screening Colonoscopy - Avg.  risk and is 50 yrs.  old or older - No.  Prior Negative Screening - Now for repeat screening. N/A  History of Adenoma - Now for follow-up colonoscopy & has been > or = to 3 yrs.  N/A  Polyps Removed Today? Yes. ASA CLASS:   Class III INDICATIONS:iron deficiency anemia, hematochezia, an abnormal CT, and abdominal pain in the upper right quadrant. MEDICATIONS: Benadryl 50 mg IV, Fentanyl 100 mcg IV, and Versed 8 mg IV DESCRIPTION OF PROCEDURE:   After the risks benefits and alternatives of the procedure were thoroughly explained, informed consent was obtained.  The digital rectal exam revealed no abnormalities of the rectum.   The Pentax Ped Colon [id number] endoscope was introduced through the anus and advanced to the hepatic flexure. No adverse events experienced.   Limited by an obstruction at hepatic flexure.   The quality of the prep was good, using MoviPrep  The instrument was then slowly withdrawn as the colon was fully examined.    COLON FINDINGS: A large, circumferential, friable, firm, obstructing and ulcerated mass was found at the hepatic flexure.  Multiple biopsies of the lesion were performed.  There was mild diverticulosis noted in the sigmoid colon and descending colon. Old blood was noted in the colon. The examination was otherwise normal. Retroflexed views revealed no abnormalities. The time to cecum=4 minutes 00 seconds.  Withdrawal time=7 minutes 00 seconds.  The scope was withdrawn and the procedure completed. COMPLICATIONS: There were no immediate complications.  ENDOSCOPIC IMPRESSION: 1.   Large  circumferential obstructing friable mass at the hepatic flexure; multiple biopsies performed 2.   Mild diverticulosis was noted in the sigmoid colon and descending colon  RECOMMENDATIONS: 1.  Await pathology results 2.  Surgery consult  eSigned:  Ladene Artist, MD, [facility] 07/29/2014 10:42 AM   [C

## 2014-07-29 NOTE — Consult Note (Addendum)
Reason for Consult: Large circumferential obstructing friable mass at the hepatic flexure. PCP:  [name], MD Referring Physician: Dr. Kennedy Bucker   Donald Fry is an 59 y.o. male.  HPI: 59 y/o male presents with right abdominal pain for 4-5 days prior to admission on 07/28/14.  He had been seen on 07/25/14 with complaint of right sided chest pain.  The pain was focally in the right inferior axilla, nonradiating.  The pain is sharp, severe, worse with supine positioning, and also with motion. Mild associated dyspnea. He was also anemic.  Abdominal ultrasound showed some gallstones, but no cholecystitis.  Pt followed up with his PCP and anemia found was worse.  He was seen back in our ED and Hbg was 7.9.  CT scan showed Markedly abnormal hepatic flexure with wall thickening along a 10-12 cm length,  extensive surrounding mesenteric nodularity, and increased regional lymph nodes. The appearance is atypical for acute colitis and highly suspicious for adenocarcinoma. Small volume perihepatic fluid. Multiple up to 14 mm indeterminate low-density lesions in the liver. Several prominent but indeterminate retroperitoneal lymph nodes in the upper abdomen. There is also an 8 mm lesion left liver lobe.  Work up since admission by Dr. Fuller Plan with colonoscopy shows a large partially obstructing Large circumferential obstructing friable mass at the hepatic flexure; multiple biopsies performed Mild diverticulosis was noted in the sigmoid colon and descending colon. He is having rectal bleeding and weight loss and well as weakness at work.  Today he still has constant pain right side from xyphoid to right flank, it is all below his ribs.  We are ask to evaluate for the right colon mass.     Past Medical History  Diagnosis Date  Hypertension   High cholesterol   Tobacco use   Hx of ETOH use he quit 4 years ago   Anxiety        History reviewed. No pertinent past surgical history. None  History  reviewed. No pertinent family history.  Father died of small cell lung cancer with Metastasis. Mother died from pulmonary disease.  Siblings have no significant history.  Social History:  reports that he has been smoking Cigarettes.  He has been smoking about 0.50 packs per day. He has never used smokeless tobacco. He reports that he does not drink alcohol or use illicit drugs. Tobacco: 0.5-1.5 packs per day for 39 years ETOH:  Up to a case per week for 10+ years, quit 4 years ago. Drugs:  None Married Works as a Scientist, research (medical), with a truck route.   Allergies: No Known Allergies  Medications:  Prior to Admission:  Prescriptions prior to admission  Medication Sig Dispense Refill Last Dose  . ALPRAZolam (XANAX) 1 MG tablet Take 1 mg by mouth 3 (three) times daily.   07/28/2014 at Unknown time  . aspirin 81 MG tablet Take 81 mg by mouth daily.   07/28/2014 at Unknown time  . atenolol (TENORMIN) 100 MG tablet Take 100 mg by mouth daily.   07/28/2014 at 0500  . ferrous sulfate 325 (65 FE) MG tablet Take 325 mg by mouth daily with breakfast.   07/27/2014 at Unknown time  . lovastatin (MEVACOR) 40 MG tablet Take 40 mg by mouth at bedtime.   07/27/2014 at Unknown time  . multivitamin-iron-minerals-folic acid (CENTRUM) chewable tablet Chew 1 tablet by mouth daily. 30 tablet 0 07/28/2014 at Unknown time  . traMADol (ULTRAM) 50 MG tablet Take 1 tablet (50 mg total) by mouth every 6 (  six) hours as needed for severe pain. 15 tablet 0 07/28/2014 at Unknown time  . atenolol-chlorthalidone (TENORETIC) 100-25 MG per tablet Take 1 tablet by mouth daily.     . [EXPIRED] predniSONE (DELTASONE) 50 MG tablet Take 1 tablet (50 mg total) by mouth daily. 3 tablet 0    Scheduled: . sodium chloride   Intravenous Once  . atenolol  100 mg Oral Daily  . ferrous sulfate  325 mg Oral Q breakfast  . nicotine  21 mg Transdermal Daily  . pantoprazole (PROTONIX) IV  80 mg Intravenous Once  . pravastatin  40 mg Oral q1800    Continuous:  [id number], traMADol Anti-infectives    None      Results for orders placed or performed during the hospital encounter of 07/28/14 (from the past 48 hour(s))  CBC with Differential     Status: None (Preliminary result)   Collection Time: 07/28/14  7:55 AM  Result Value Ref Range   WBC SPECIMEN CLOTTED 4.0 - 10.5 K/uL   RBC SPECIMEN CLOTTED 4.22 - 5.81 MIL/uL   Hemoglobin SPECIMEN CLOTTED 13.0 - 17.0 g/dL   HCT SPECIMEN CLOTTED 39.0 - 52.0 %   MCV SPECIMEN CLOTTED 78.0 - 100.0 fL   MCH SPECIMEN CLOTTED 26.0 - 34.0 pg   MCHC SPECIMEN CLOTTED 30.0 - 36.0 g/dL   RDW SPECIMEN CLOTTED 11.5 - 15.5 %   Platelets SPECIMEN CLOTTED 150 - 400 K/uL   Neutrophils Relative % PENDING 43 - 77 %   Neutro Abs PENDING 1.7 - 7.7 K/uL   Band Neutrophils PENDING 0 - 10 %   Lymphocytes Relative PENDING 12 - 46 %   Lymphs Abs PENDING 0.7 - 4.0 K/uL   Monocytes Relative PENDING 3 - 12 %   Monocytes Absolute PENDING 0.1 - 1.0 K/uL   Eosinophils Relative PENDING 0 - 5 %   Eosinophils Absolute PENDING 0.0 - 0.7 K/uL   Basophils Relative PENDING 0 - 1 %   Basophils Absolute PENDING 0.0 - 0.1 K/uL   LUCs, % PENDING 0 - 4 %   LUC, Absolute PENDING 0.0 - 0.5 K/uL   WBC Morphology PENDING    RBC Morphology PENDING    Smear Review PENDING    Other PENDING %   Other 2 PENDING %   nRBC PENDING 0 /100 WBC   Metamyelocytes Relative PENDING %   Myelocytes PENDING %   Promyelocytes Absolute PENDING %   Blasts PENDING %  Occult blood card to lab, stool Provider will collect     Status: Abnormal   Collection Time: 07/28/14  8:00 AM  Result Value Ref Range   Fecal Occult Bld POSITIVE (A) NEGATIVE  Urinalysis, Routine w reflex microscopic     Status: None   Collection Time: 07/28/14  8:30 AM  Result Value Ref Range   Color, Urine YELLOW YELLOW   APPearance CLEAR CLEAR   Specific Gravity, Urine 1.006 1.005 - 1.030   pH 7.0 5.0 - 8.0   Glucose, UA NEGATIVE NEGATIVE mg/dL   Hgb urine  dipstick NEGATIVE NEGATIVE   Bilirubin Urine NEGATIVE NEGATIVE   Ketones, ur NEGATIVE NEGATIVE mg/dL   Protein, ur NEGATIVE NEGATIVE mg/dL   Urobilinogen, UA 0.2 0.0 - 1.0 mg/dL   Nitrite NEGATIVE NEGATIVE   Leukocytes, UA NEGATIVE NEGATIVE    Comment: MICROSCOPIC NOT DONE ON URINES WITH NEGATIVE PROTEIN, BLOOD, LEUKOCYTES, NITRITE, OR GLUCOSE <1000 mg/dL.  CBC with Differential     Status: Abnormal   Collection Time: 07/28/14  8:30 AM  Result Value Ref Range   WBC 18.7 (H) 4.0 - 10.5 K/uL   RBC 3.42 (L) 4.22 - 5.81 MIL/uL   Hemoglobin 7.9 (L) 13.0 - 17.0 g/dL   HCT 26.8 (L) 39.0 - 52.0 %   MCV 78.4 78.0 - 100.0 fL   MCH 23.1 (L) 26.0 - 34.0 pg   MCHC 29.5 (L) 30.0 - 36.0 g/dL   RDW 15.6 (H) 11.5 - 15.5 %   Platelets 526 (H) 150 - 400 K/uL   Neutrophils Relative % 83 (H) 43 - 77 %   Neutro Abs 15.6 (H) 1.7 - 7.7 K/uL   Lymphocytes Relative 9 (L) 12 - 46 %   Lymphs Abs 1.6 0.7 - 4.0 K/uL   Monocytes Relative 7 3 - 12 %   Monocytes Absolute 1.3 (H) 0.1 - 1.0 K/uL   Eosinophils Relative 1 0 - 5 %   Eosinophils Absolute 0.1 0.0 - 0.7 K/uL   Basophils Relative 0 0 - 1 %   Basophils Absolute 0.1 0.0 - 0.1 K/uL  Lipase, blood     Status: None   Collection Time: 07/28/14  8:30 AM  Result Value Ref Range   Lipase 20 11 - 59 U/L  Comprehensive metabolic panel     Status: Abnormal   Collection Time: 07/28/14  8:30 AM  Result Value Ref Range   Sodium 136 135 - 145 mmol/L   Potassium 3.1 (L) 3.5 - 5.1 mmol/L   Chloride 100 96 - 112 mmol/L   CO2 31 19 - 32 mmol/L   Glucose, Bld 103 (H) 70 - 99 mg/dL   BUN 17 6 - 23 mg/dL   Creatinine, Ser 0.70 0.50 - 1.35 mg/dL   Calcium 8.5 8.4 - 10.5 mg/dL   Total Protein 6.7 6.0 - 8.3 g/dL   Albumin 3.4 (L) 3.5 - 5.2 g/dL   AST 16 0 - 37 U/L   ALT 10 0 - 53 U/L   Alkaline Phosphatase 78 39 - 117 U/L   Total Bilirubin 0.3 0.3 - 1.2 mg/dL   GFR calc non Af Amer >90 >90 mL/min   GFR calc Af Amer >90 >90 mL/min    Comment: (NOTE) The eGFR has  been calculated using the CKD EPI equation. This calculation has not been validated in all clinical situations. eGFR's persistently <90 mL/min signify possible Chronic Kidney Disease.    Anion gap 5 5 - 15  Prepare RBC     Status: None   Collection Time: 07/28/14  5:00 PM  Result Value Ref Range   Order Confirmation ORDER PROCESSED BY BLOOD BANK   Type and screen     Status: None (Preliminary result)   Collection Time: 07/28/14  5:25 PM  Result Value Ref Range   ABO/RH(D) A POS    Antibody Screen NEG    Sample Expiration 07/31/2014    Unit Number [id number]    Blood Component Type RED CELLS,LR    Unit division 00    Status of Unit ISSUED    Transfusion Status OK TO TRANSFUSE    Crossmatch Result Compatible   ABO/Rh     Status: None   Collection Time: 07/28/14  5:25 PM  Result Value Ref Range   ABO/RH(D) A POS   CBC     Status: Abnormal   Collection Time: 07/29/14  5:55 AM  Result Value Ref Range   WBC 15.5 (H) 4.0 - 10.5 K/uL   RBC 3.45 (L) 4.22 - 5.81 MIL/uL  Hemoglobin 8.0 (L) 13.0 - 17.0 g/dL   HCT 27.5 (L) 39.0 - 52.0 %   MCV 79.7 78.0 - 100.0 fL   MCH 23.2 (L) 26.0 - 34.0 pg   MCHC 29.1 (L) 30.0 - 36.0 g/dL   RDW 15.7 (H) 11.5 - 15.5 %   Platelets 488 (H) 150 - 400 K/uL  Basic metabolic panel     Status: None   Collection Time: 07/29/14  5:55 AM  Result Value Ref Range   Sodium 137 135 - 145 mmol/L   Potassium 4.0 3.5 - 5.1 mmol/L   Chloride 100 96 - 112 mmol/L   CO2 29 19 - 32 mmol/L   Glucose, Bld 98 70 - 99 mg/dL   BUN 19 6 - 23 mg/dL   Creatinine, Ser 0.68 0.50 - 1.35 mg/dL   Calcium 8.5 8.4 - 10.5 mg/dL   GFR calc non Af Amer >90 >90 mL/min   GFR calc Af Amer >90 >90 mL/min    Comment: (NOTE) The eGFR has been calculated using the CKD EPI equation. This calculation has not been validated in all clinical situations. eGFR's persistently <90 mL/min signify possible Chronic Kidney Disease.    Anion gap 8 5 - 15    Ct Abdomen Pelvis W  Contrast  07/28/2014   ADDENDUM REPORT: 07/28/2014 11:16  ADDENDUM: Study discussed by telephone with Dr. Quintella Reichert on 07/28/2014 at 1105 hours.   Electronically Signed   By: Genevie Ann M.D.   On: 07/28/2014 11:16   07/28/2014   CLINICAL DATA:  59 year old male with 2 weeks of right side abdominal pain. Recently diagnosed with gallstones. Initial encounter.  EXAM: CT ABDOMEN AND PELVIS WITH CONTRAST  TECHNIQUE: Multidetector CT imaging of the abdomen and pelvis was performed using the standard protocol following bolus administration of intravenous contrast.  CONTRAST:  67m OMNIPAQUE IOHEXOL 300 MG/ML SOLN, 277mOMNIPAQUE IOHEXOL 300 MG/ML SOLN, 10039mMNIPAQUE IOHEXOL 300 MG/ML SOLN  COMPARISON:  Abdomen ultrasound 07/25/2014.  FINDINGS: Minor atelectasis at the right lung base. No pericardial or pleural effusion.  No acute osseous abnormality identified.  No pelvic free fluid. Retained stool in the rectum. Negative bladder. Moderately redundant sigmoid colon with retained stool. Negative left colon. Moderately redundant splenic flexure.  Abnormal hepatic flexure with elongated circumferential wall thickening and luminal narrowing, but also extensive surrounding hepatic flexure mesentery nodularity and irregular intermediate density. Discrete rounded adjacent mesenteric nodes measuring up to 9 mm diameter are present.  The abnormal pericolic fat continues to the gallbladder fossa. However, the gallbladder itself does not appear distended or definitely inflamed. There is cholelithiasis evident at the gallbladder fundus.  The upstream ascending colon and cecum than have a more normal appearance. There is a gas-filled laterally situated appendix which tracks cephalad. The terminal ileum is within normal limits. Oral contrast has not yet reached the distal small bowel. No dilated small bowel loops. Negative stomach. The second portion of the duodenum also is in close proximity to the abnormal hepatic flexure.  Trace  perihepatic free fluid. Multiple nonspecific small low-density lesions in liver such as the 14 mm right hepatic lobe entity on series 2, image 27. Also 8 mm lesion in the left lobe on image 27. Larger 14 mm lesion in the lateral left lobe on image 33.  Spleen, pancreas, right adrenal gland, portal venous system, and kidneys are within normal limits.  There is intermediate density nodularity adjacent to the medial lobe of the left adrenal gland which is nonspecific. To  a degree this resembles an enlarged retroperitoneal lymph node. There is a similar prominent node situated between the cava and right corona of the diaphragm measuring 6 mm.  No pneumoperitoneum. Aortoiliac calcified atherosclerosis noted. Major arterial structures in the abdomen and pelvis are patent.  IMPRESSION: 1. Markedly abnormal hepatic flexure with wall thickening along a 10-12 cm length but extensive surrounding mesenteric nodularity, and increased regional lymph nodes. The appearance is atypical for acute colitis and highly suspicious for adenocarcinoma. GI consultation for followup colonoscopy recommended. 2. Small volume perihepatic fluid. Multiple up to 14 mm indeterminate low-density lesions in the liver. Several prominent but indeterminate retroperitoneal lymph nodes in the upper abdomen. 3. Abnormal fat stranding/nodularity continues to the gallbladder fossa, but the gallbladder does not appear inflamed.  Electronically Signed: By: Genevie Ann M.D. On: 07/28/2014 11:00    Review of Systems  Constitutional: Positive for weight loss (4 pounds since september 2015) and malaise/fatigue. Negative for fever and diaphoresis.  HENT: Negative.        Reading glasses  Eyes: Negative.   Respiratory: Positive for shortness of breath (DOE with exertion is new and started2/15/16.  He is very clear about this.) and wheezing.   Cardiovascular: Negative.   Gastrointestinal: Positive for abdominal pain (Pain is just below the xyphoid going along  the borders fo the upper abdomen to his side.  it is constant and it hurts to lie now on his  right side.), constipation (He has had a low grade constipation for months, he has been on stool softners for this.  He has some days where he may go every other day, and sometimes where he does not go for 3-4 day.) and blood in stool (he has seen some in the past, and allot with bowel prep for colonoscopy.). Negative for heartburn, nausea, vomiting and diarrhea.  Genitourinary: Negative.   Musculoskeletal:       Some right shoulder discomfort.  Skin: Negative.   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 Negative for tingling, tremors, sensory change, speech change, seizures and loss of consciousness.  Endo/Heme/Allergies: Negative.   Psychiatric/Behavioral: The patient is nervous/anxious.    Blood pressure 117/59, pulse 53, temperature 97.7 F (36.5 C), temperature source Oral, resp. rate 16, height _0  (1.753 m), weight 73.936 kg (163 lb), SpO2 99 %. Physical Exam  Constitutional: He is oriented to person, place, and time. He appears well-developed and well-nourished. No distress.  HENT:  Head: Normocephalic and atraumatic.  Nose: Nose normal.  Eyes: Conjunctivae and EOM are normal. Pupils are equal, round, and reactive to light. Right eye exhibits no discharge. Left eye exhibits no discharge. No scleral icterus.  Neck: Normal range of motion. Neck supple. No JVD present. No tracheal deviation present. No thyromegaly present.  Cardiovascular: Normal rate, regular rhythm, normal heart sounds and intact distal pulses.   No murmur heard. Respiratory: Effort normal. No respiratory distress. He has wheezes. He has no rales. He exhibits no tenderness.  GI: Soft. Bowel sounds are normal. He exhibits no distension (Pain from xyphoid along upper chest wall to right flank.  Pain is constant.) and no mass.  There is tenderness. There is no rebound and no guarding.  Musculoskeletal: He exhibits no edema or tenderness.  Lymphadenopathy:    He has no cervical adenopathy.  Neurological: He is alert and oriented to person, place, and time. No cranial nerve deficit.  Skin: Skin is  warm and dry. No rash noted. He is not diaphoretic. No erythema. No pallor.  Psychiatric: He has a normal mood and affect. His behavior is normal. Judgment and thought content normal.    Assessment/Plan: 1. Large circumferential bleeding, partially obstructing friable mass at the Hepatic Flexure of the colon-suspicious for cancer; CT shows inflammatory changes concerning for a microperf.  He also has cholelithiasis 2.  Anemia/Leukocytosis 3.  Hypertension 4.  Tobacco use 39 years 5.  Hx of ETOH use quit 4 years ago 6.  Dyslipidemia    Plan:  Start IV Zosyn.  Laparoscopic assisted partial colectomy, cholecystectomy and possible liver biopsy tomorrow.  I have explained the procedure and risks of colon resection and galllbladder.  Risks include but are not limited to bleeding, infection, wound problems, anesthesia, anastomotic leak, need for colostomy, need for reoperative surgery,  injury to intraabominal organs (such as intestine, spleen, kidney, bladder, ureter, etc.),  irregular bowel habits.  He seems to understand and would like to proceed.  Donald Fry,Donald Fry 07/29/2014, 11:15 AM

## 2014-07-29 NOTE — Consult Note (Signed)
Referring Provider: [facility] Primary Care Physician:  [name], MD Primary Gastroenterologist:  unassigned  Reason for Consultation:   Right sided abdominal pain, GI bleed, blood loss anemia  HPI: Donald Fry is a 59 y.o. male who is followed medically by Dr. Doyle Askew of [facility]. He has past medical history of hyperlipidemia, hypertension, and tobacco abuse. He presented to the [facility] ED yesterday with complaint of right upper quadrant pain that had been present for 4 or 5 days. He had been seen in the emergency room last week at which time he had a right upper quadrant ultrasound that revealed cholelithiasis without evidence of cholecystitis. At that time he was found to have a hemoglobin of 8.2. He was seen in his primary care provider's office for follow-up and had a repeat CBC and was found to have a lower hemoglobin.  Patient states that he has felt tired and has had dyspnea on exertion for the past several months. He has had no change in his bowel habits or stool caliber. He has had no bloody or tarry stools. He has lost 4 pounds over the past several months unintentionally he has had no nausea or vomiting. His appetite has been good.Marland Kitchen He presented to the emergency room yesterday due to his anemia and abdominal pain, and underwent a CT scan of the abdomen and pelvis which revealed a possible malignancy near the hepatic flexure with surrounding lymphadenopathy. He has not had a colonoscopy in the past. He denies a family history of colon cancer, colon polyps, or inflammatory bowel disease. He says there is a family history of pancreatic cancer and lung cancer. Patient received one unit packed red blood cells last evening.   Past Medical History  Diagnosis Date  . Hypertension   . High cholesterol   . Anxiety     History reviewed. No pertinent past surgical history.  Prior to Admission medications   Medication Sig Start Date End Date Taking?  Authorizing Provider  ALPRAZolam Duanne Moron) 1 MG tablet Take 1 mg by mouth 3 (three) times daily.   Yes Historical Provider, MD  aspirin 81 MG tablet Take 81 mg by mouth daily.   Yes Historical Provider, MD  atenolol (TENORMIN) 100 MG tablet Take 100 mg by mouth daily.   Yes Historical Provider, MD  ferrous sulfate 325 (65 FE) MG tablet Take 325 mg by mouth daily with breakfast.   Yes Historical Provider, MD  lovastatin (MEVACOR) 40 MG tablet Take 40 mg by mouth at bedtime.   Yes Historical Provider, MD  multivitamin-iron-minerals-folic acid (CENTRUM) chewable tablet Chew 1 tablet by mouth daily. 07/25/14  Yes Carmin Muskrat, MD  traMADol (ULTRAM) 50 MG tablet Take 1 tablet (50 mg total) by mouth every 6 (six) hours as needed for severe pain. 07/25/14  Yes Carmin Muskrat, MD  atenolol-chlorthalidone (TENORETIC) 100-25 MG per tablet Take 1 tablet by mouth daily.    Historical Provider, MD    Current Facility-Administered Medications  Medication Dose Route Frequency Provider Last Rate Last Dose  . 0.9 %  sodium chloride infusion   Intravenous Once Costin Karlyne Greenspan, MD      . ALPRAZolam Duanne Moron) tablet 1 mg  1 mg Oral TID PRN Caren Griffins, MD   1 mg at 07/29/14 0547  . atenolol (TENORMIN) tablet 100 mg  100 mg Oral Daily Costin Karlyne Greenspan, MD      . ferrous sulfate tablet 325 mg  325 mg Oral Q breakfast Costin  Karlyne Greenspan, MD   325 mg at 07/29/14 0800  . nicotine (NICODERM CQ - dosed in mg/24 hours) patch 21 mg  21 mg Transdermal Daily Costin Karlyne Greenspan, MD   21 mg at 07/28/14 1803  . pantoprazole (PROTONIX) 80 mg in sodium chloride 0.9 % 100 mL IVPB  80 mg Intravenous Once Quintella Reichert, MD      . pravastatin (PRAVACHOL) tablet 40 mg  40 mg Oral q1800 Caren Griffins, MD   40 mg at 07/29/14 0020  . traMADol (ULTRAM) tablet 50 mg  50 mg Oral Q6H PRN Caren Griffins, MD   50 mg at 07/29/14 0546    Allergies as of 07/28/2014  . (No Known Allergies)    History reviewed. No pertinent family  history.  History   Social History  . Marital Status: Married    Spouse Name: N/A  . Number of Children: N/A  . Years of Education: N/A   Occupational History  . Not on file.   Social History Main Topics  . Smoking status: Current Every Day Smoker -- 0.50 packs/day    Types: Cigarettes  . Smokeless tobacco: Never Used  . Alcohol Use: No  . Drug Use: No  . Sexual Activity: No   Other Topics Concern  . Not on file   Social History Narrative    Review of Systems: Gen: Denies any fever, chills, sweats, anorexia, fatigue, weakness, malaise,  and sleep disorder. Has had weight loss. CV: Denies chest pain, angina, palpitations, syncope, orthopnea, PND, peripheral edema, and claudication. Resp: Denies dyspnea at rest, dyspnea with exercise, cough, sputum, wheezing, coughing up blood, and pleurisy. GI: Denies vomiting blood, jaundice, and fecal incontinence.   Denies dysphagia or odynophagia. GU : Denies urinary burning, blood in urine, urinary frequency, urinary hesitancy, nocturnal urination, and urinary incontinence. MS: Denies joint pain, limitation of movement, and swelling, stiffness, low back pain, extremity pain. Denies muscle weakness, cramps, atrophy.  Derm: Denies rash, itching, dry skin, hives, moles, warts, or unhealing ulcers.  Psych: Denies depression, anxiety, memory loss, suicidal ideation, hallucinations, paranoia, and confusion. Heme: Denies bruising, bleeding, and enlarged lymph nodes. Neuro:  Denies any headaches, dizziness, paresthesias. Endo:  Denies any problems with DM, thyroid, adrenal function.  Physical Exam: Vital signs in last 24 hours: Temp:  [97.4 F (36.3 C)-98.8 F (37.1 C)] 97.4 F (36.3 C) (02/25 0600) Pulse Rate:  [52-63] 55 (02/25 0600) Resp:  [16-18] 16 (02/25 0600) BP: (112-148)/(56-76) 148/76 mmHg (02/25 0600) SpO2:  [94 %-100 %] 100 % (02/25 0600) Last BM Date: 07/28/14 General:   Alert,  Well-developed, well-nourished, pleasant and  cooperative in NAD Head:  Normocephalic and atraumatic. Eyes:  Sclera clear, no icterus.   Conjunctiva pink. Ears:  Normal auditory acuity. Nose:  No deformity, discharge,  or lesions. Mouth:  No deformity or lesions.   Neck:  Supple; no masses or thyromegaly. Lungs:  Clear throughout to auscultation. Scattered wheezes cleared with cough  Heart:  Regular rate and rhythm; no murmurs, clicks, rubs,  or gallops. Abdomen:  Soft,tender RUQ with no rebound or guarding BS active,nonpalp mass or hsm.   Rectal:  Deferred  Msk:  Symmetrical without gross deformities. . Pulses:  Normal pulses noted. Extremities:  Without clubbing or edema. Neurologic:  Alert and  oriented x4;  grossly normal neurologically. Skin:  Intact without significant lesions or rashes.. Psych:  Alert and cooperative. Normal mood and affect.  Intake/Output from previous day: 02/24 0701 - 02/25 0700 In: 330 [  Blood:330] Out: 3875 [Urine:1125] Intake/Output this shift:    Lab Results:  Recent Labs  07/28/14 0755 07/28/14 0830 07/29/14 0555  WBC SPECIMEN CLOTTED 18.7* 15.5*  HGB SPECIMEN CLOTTED 7.9* 8.0*  HCT SPECIMEN CLOTTED 26.8* 27.5*  PLT SPECIMEN CLOTTED 526* 488*   BMET  Recent Labs  07/28/14 0830 07/29/14 0555  NA 136 137  K 3.1* 4.0  CL 100 100  CO2 31 29  GLUCOSE 103* 98  BUN 17 19  CREATININE 0.70 0.68  CALCIUM 8.5 8.5   LFT  Recent Labs  07/28/14 0830  PROT 6.7  ALBUMIN 3.4*  AST 16  ALT 10  ALKPHOS 78  BILITOT 0.3      Studies/Results: Ct Abdomen Pelvis W Contrast  07/28/2014   ADDENDUM REPORT: 07/28/2014 11:16  ADDENDUM: Study discussed by telephone with Dr. Quintella Reichert on 07/28/2014 at 1105 hours.   Electronically Signed   By: Genevie Ann M.D.   On: 07/28/2014 11:16   07/28/2014   CLINICAL DATA:  59 year old male with 2 weeks of right side abdominal pain. Recently diagnosed with gallstones. Initial encounter.  EXAM: CT ABDOMEN AND PELVIS WITH CONTRAST  TECHNIQUE: Multidetector  CT imaging of the abdomen and pelvis was performed using the standard protocol following bolus administration of intravenous contrast.  CONTRAST:  16mL OMNIPAQUE IOHEXOL 300 MG/ML SOLN, 46mL OMNIPAQUE IOHEXOL 300 MG/ML SOLN, 178mL OMNIPAQUE IOHEXOL 300 MG/ML SOLN  COMPARISON:  Abdomen ultrasound 07/25/2014.  FINDINGS: Minor atelectasis at the right lung base. No pericardial or pleural effusion.  No acute osseous abnormality identified.  No pelvic free fluid. Retained stool in the rectum. Negative bladder. Moderately redundant sigmoid colon with retained stool. Negative left colon. Moderately redundant splenic flexure.  Abnormal hepatic flexure with elongated circumferential wall thickening and luminal narrowing, but also extensive surrounding hepatic flexure mesentery nodularity and irregular intermediate density. Discrete rounded adjacent mesenteric nodes measuring up to 9 mm diameter are present.  The abnormal pericolic fat continues to the gallbladder fossa. However, the gallbladder itself does not appear distended or definitely inflamed. There is cholelithiasis evident at the gallbladder fundus.  The upstream ascending colon and cecum than have a more normal appearance. There is a gas-filled laterally situated appendix which tracks cephalad. The terminal ileum is within normal limits. Oral contrast has not yet reached the distal small bowel. No dilated small bowel loops. Negative stomach. The second portion of the duodenum also is in close proximity to the abnormal hepatic flexure.  Trace perihepatic free fluid. Multiple nonspecific small low-density lesions in liver such as the 14 mm right hepatic lobe entity on series 2, image 27. Also 8 mm lesion in the left lobe on image 27. Larger 14 mm lesion in the lateral left lobe on image 33.  Spleen, pancreas, right adrenal gland, portal venous system, and kidneys are within normal limits.  There is intermediate density nodularity adjacent to the medial lobe of the  left adrenal gland which is nonspecific. To a degree this resembles an enlarged retroperitoneal lymph node. There is a similar prominent node situated between the cava and right corona of the diaphragm measuring 6 mm.  No pneumoperitoneum. Aortoiliac calcified atherosclerosis noted. Major arterial structures in the abdomen and pelvis are patent.  IMPRESSION: 1. Markedly abnormal hepatic flexure with wall thickening along a 10-12 cm length but extensive surrounding mesenteric nodularity, and increased regional lymph nodes. The appearance is atypical for acute colitis and highly suspicious for adenocarcinoma. GI consultation for followup colonoscopy recommended. 2. Small volume perihepatic  fluid. Multiple up to 14 mm indeterminate low-density lesions in the liver. Several prominent but indeterminate retroperitoneal lymph nodes in the upper abdomen. 3. Abnormal fat stranding/nodularity continues to the gallbladder fossa, but the gallbladder does not appear inflamed.  Electronically Signed: By: Genevie Ann M.D. On: 07/28/2014 11:00    IMPRESSION/PLAN: 59 year old man with a one-week history of right-sided abdominal pain, fatigue, and dyspnea on exertion, found to be anemic and admitted for evaluation. CT scan has revealed marked abnormal thickening of the colon along the hepatic flexure with extensive surrounding mesenteric nodularity concerning for possible adenocarcinoma. Patient is to undergo a colonoscopy for further evaluation of the area today.The risks, benefits, and alternatives to colonoscopy with possible biopsy and possible polypectomy were discussed with the patient and they consent to proceed.    Hvozdovic, Deloris Ping 07/29/2014,  Pager [phone]     Attending physician's note   I have taken a history, examined the patient and reviewed the chart. I agree with the Advanced Practitioner's note, impression and recommendations. RUQ/R flank pain, hematochezia, microcytic anemia, abnormal CT of hepatic  flexure. 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 Send Fe studies. Colonoscopy today. The risks (including bleeding, perforation, infection, missed lesions, medication reactions and possible hospitalization or surgery if complications occur), benefits, and alternatives to colonoscopy with possible biopsy and possible polypectomy were discussed with the patient and they consent to proceed.    Ladene Artist, MD Marval Regal

## 2014-07-29 NOTE — Progress Notes (Signed)
Progress Note   Donald Fry RJJ:[id number] DOB: [birth year] DOA: 07/28/2014 PCP: [name], MD   Brief Narrative:   Donald Fry is an 59 y.o. male with a PMH of hyperlipidemia, anemia, hypertension and tobacco abuse who was admitted 07/28/14 with chief complaint of 4-5 day history of right upper quadrant pain. He also has had a several month history of dyspnea on exertion, unintentional weight loss. A CT scan of the abdomen and pelvis was done on admission which showed a possible colon cancer near the hepatic flexure with surrounding lymphadenopathy. He has not had a colonoscopy in the past. GI has been consulted.  Assessment/Plan:   Active Problems: GI bleed / blood loss anemia - Seems to be a more of an acute on chronic GI bleed with a CT finding probability for colon cancer and progressive symptoms.  - S/P 1 unit of PRBCs 07/28/14. - Being followed by Dr. Fuller Plan, s/p colonoscopy showing a large, circumferential, friable, firm, obstructing and ulcerated mass at the hepatic flexure. Multiple biopsies of the lesion were performed. - Surgery consulted.  Hypertension  - Continue atenolol.  Hyperlipidemia  - Continue home statin.   Anxiety  - Continue home medications.  Tobacco abuse  - Counseled for cessation; continue nicotine patch.  Hypokalemia  - Repleted.  Leukocytosis  - Start on empiric Zosyn given rising WBC and possibility of microperforation from colonic mass.   DVT Prophylaxis -SCDs ordered.  Code Status: Full. Family Communication: Wife and sister updated at the bedside. Disposition Plan: Home when stable.   IV Access:    Peripheral IV   Procedures and diagnostic studies:   Ct Abdomen Pelvis W Contrast 07/28/14: 1. Markedly abnormal hepatic flexure with wall thickening along a 10-12 cm length but extensive surrounding mesenteric nodularity, and increased regional lymph nodes. The appearance is atypical for acute colitis and highly  suspicious for adenocarcinoma. GI consultation for followup colonoscopy recommended. 2. Small volume perihepatic fluid. Multiple up to 14 mm indeterminate low-density lesions in the liver. Several prominent but indeterminate retroperitoneal lymph nodes in the upper abdomen. 3. Abnormal fat stranding/nodularity continues to the gallbladder fossa, but the gallbladder does not appear inflamed.    Colonoscopy 07/29/14: 1. Large circumferential obstructing friable mass at the hepaticflexure; multiple biopsies performed2. Mild diverticulosis was noted in the sigmoid colon anddescending colon.  Medical Consultants:    Dr. Lucio Edward, GI.  Dr. Jackolyn Confer, Surgery  Anti-Infectives:    Zosyn 07/29/14--->  Subjective:   Donald Fry is a bit anxious post colonoscopy. He reports having had worsening left-sided abdominal pain and dark stools for several days to up to 2 weeks. No nausea or vomiting.  Objective:    Filed Vitals:   07/29/14 0040 07/29/14 0055 07/29/14 0325 07/29/14 0600  BP: 139/72 121/66 119/56 148/76  Pulse: 52 58 63 55  Temp:  98.8 F (37.1 C) 98.2 F (36.8 C) 97.4 F (36.3 C)  TempSrc: Oral Oral Oral Oral  Resp: 18 18 18 16   Height:      Weight:      SpO2: 100% 100% 98% 100%    Intake/Output Summary (Last 24 hours) at 07/29/14 0847 Last data filed at 07/29/14 0325  Gross per 24 hour  Intake    330 ml  Output   1125 ml  Net   -795 ml    Exam: Gen:  NAD Cardiovascular:  RRR, No M/R/G Respiratory:  Lungs CTAB Gastrointestinal:  Abdomen soft, tender right upper quadrant without  rebound or guarding and positive bowel sounds Extremities:  No C/E/C   Data Reviewed:    Labs: Basic Metabolic Panel:  Recent Labs Lab 07/25/14 0850 07/28/14 0830 07/29/14 0555  NA 137 136 137  K 2.9* 3.1* 4.0  CL 100 100 100  CO2 30 31 29   GLUCOSE 91 103* 98  BUN 10 17 19   CREATININE 0.73 0.70 0.68  CALCIUM 8.8 8.5 8.5   GFR Estimated Creatinine  Clearance: 98.2 mL/min (by C-G formula based on Cr of 0.68). Liver Function Tests:  Recent Labs Lab 07/25/14 0850 07/28/14 0830  AST 17 16  ALT 11 10  ALKPHOS 99 78  BILITOT 0.4 0.3  PROT 7.5 6.7  ALBUMIN 3.9 3.4*    Recent Labs Lab 07/28/14 0830  LIPASE 20   CBC:  Recent Labs Lab 07/25/14 0850 07/28/14 0755 07/28/14 0830 07/29/14 0555  WBC 11.6* SPECIMEN CLOTTED 18.7* 15.5*  NEUTROABS 8.4* PENDING 15.6*  --   HGB 8.2* SPECIMEN CLOTTED 7.9* 8.0*  HCT 27.2* SPECIMEN CLOTTED 26.8* 27.5*  MCV 77.5* SPECIMEN CLOTTED 78.4 79.7  PLT 483* SPECIMEN CLOTTED 526* 488*   Cardiac Enzymes:  Recent Labs Lab 07/25/14 0850  TROPONINI <0.03    Microbiology No results found for this or any previous visit (from the past 240 hour(s)).   Medications:   . sodium chloride   Intravenous Once  . atenolol  100 mg Oral Daily  . ferrous sulfate  325 mg Oral Q breakfast  . nicotine  21 mg Transdermal Daily  . pantoprazole (PROTONIX) IV  80 mg Intravenous Once  . pravastatin  40 mg Oral q1800   Continuous Infusions:   Time spent: 35 minutes with > 50% of time discussing current diagnostic test results, clinical impression and plan of care.    LOS: 1 day   Neria Procter  [facility] Pager [phone]. If unable to reach me by pager, please call my cell phone at [phone].  *Please refer to [email or web address], password [email or web address] to get updated schedule on who will round on this patient, as hospitalists switch teams weekly. If 7PM-7AM, please contact night-coverage at [email or web address], password [email or web address] for any overnight needs.  07/29/2014, 8:47 AM

## 2014-07-29 NOTE — Interval H&P Note (Signed)
History and Physical Interval Note:  07/29/2014 9:45 AM  Donald Fry  has presented today for surgery, with the diagnosis of abnormal radiologic study  The various methods of treatment have been discussed with the patient and family. After consideration of risks, benefits and other options for treatment, the patient has consented to  Procedure(s): COLONOSCOPY (N/A) as a surgical intervention .  The patient's history has been reviewed, patient examined, no change in status, stable for surgery.  I have reviewed the patient's chart and labs.  Questions were answered to the patient's satisfaction.     Pricilla Riffle. Fuller Plan MD

## 2014-07-29 NOTE — Progress Notes (Signed)
ANTIBIOTIC CONSULT NOTE - INITIAL  Pharmacy Consult for Zosyn Indication:  Intra-abdominal infection  No Known Allergies  Patient Measurements: Height: 5\' 9"  (175.3 cm) Weight: 163 lb (73.936 kg) IBW/kg (Calculated) : 70.7   Vital Signs: Temp: 97.7 F (36.5 C) (02/25 1326) Temp Source: Oral (02/25 1326) BP: 142/66 mmHg (02/25 1326) Pulse Rate: 61 (02/25 1326) Intake/Output from previous day: 02/24 0701 - 02/25 0700 In: 330 [Blood:330] Out: 1125 [[id number]] Intake/Output from this shift:    Labs:  Recent Labs  07/28/14 0755 07/28/14 0830 07/29/14 0555  WBC SPECIMEN CLOTTED 18.7* 15.5*  HGB SPECIMEN CLOTTED 7.9* 8.0*  PLT SPECIMEN CLOTTED 526* 488*  CREATININE  --  0.70 0.68   Estimated Creatinine Clearance: 98.2 mL/min (by C-G formula based on Cr of 0.68).    Medical History: Past Medical History  Diagnosis Date  . Hypertension   . High cholesterol   . Anxiety     Microbiology: No cultures order yet for current admission  Antimicrobials: 2/25>>Zosyn>>  Assessment: 59 y/o M presented to ED with abdominal pain, recent rectal bleeding, and weight loss.  CT suspicious for adenocarcinoma, and subsequent colonoscopy revealed large circumferential obstructing friable mass; biopsies are pending.  Surgery is following.  Orders received to begin Zosyn with pharmacy dosing assistance for intra-abdominal infection.  Goal of Therapy:  Appropriate antibiotic dosing for indication and renal function; eradication of infection.   Plan:  1. Zosyn 3.375 grams IV x 1 over 30 minutes, then 3.375 grams IV q8h by extended infusion (each dose over 4 hours). 2. Follow clinical course, renal function, any available culture results.  Clayburn Pert, PharmD, BCPS Pager: [phone] 07/29/2014  1:37 PM

## 2014-07-29 NOTE — Progress Notes (Signed)
Pt returned from colonoscopy. Vitals taken and will continue to be taken per protocol. Pt currently resting well with no issues noted. Will continue to monitor.

## 2014-07-29 NOTE — H&P (View-Only) (Signed)
Referring Provider: [facility] Primary Care Physician:  [name], MD Primary Gastroenterologist:  unassigned  Reason for Consultation:   Right sided abdominal pain, GI bleed, blood loss anemia  HPI: Donald Fry is a 59 y.o. male who is followed medically by Dr. Doyle Askew of [facility]. He has past medical history of hyperlipidemia, hypertension, and tobacco abuse. He presented to the [place] ED yesterday with complaint of right upper quadrant pain that had been present for 4 or 5 days. He had been seen in the emergency room last week at which time he had a right upper quadrant ultrasound that revealed cholelithiasis without evidence of cholecystitis. At that time he was found to have a hemoglobin of 8.2. He was seen in his primary care provider's office for follow-up and had a repeat CBC and was found to have a lower hemoglobin.  Patient states that he has felt tired and has had dyspnea on exertion for the past several months. He has had no change in his bowel habits or stool caliber. He has had no bloody or tarry stools. He has lost 4 pounds over the past several months unintentionally he has had no nausea or vomiting. His appetite has been good.Marland Kitchen He presented to the emergency room yesterday due to his anemia and abdominal pain, and underwent a CT scan of the abdomen and pelvis which revealed a possible malignancy near the hepatic flexure with surrounding lymphadenopathy. He has not had a colonoscopy in the past. He denies a family history of colon cancer, colon polyps, or inflammatory bowel disease. He says there is a family history of pancreatic cancer and lung cancer. Patient received one unit packed red blood cells last evening.   Past Medical History  Diagnosis Date  . Hypertension   . High cholesterol   . Anxiety     History reviewed. No pertinent past surgical history.  Prior to Admission medications   Medication Sig Start Date End Date Taking?  Authorizing Provider  ALPRAZolam Duanne Moron) 1 MG tablet Take 1 mg by mouth 3 (three) times daily.   Yes Historical Provider, MD  aspirin 81 MG tablet Take 81 mg by mouth daily.   Yes Historical Provider, MD  atenolol (TENORMIN) 100 MG tablet Take 100 mg by mouth daily.   Yes Historical Provider, MD  ferrous sulfate 325 (65 FE) MG tablet Take 325 mg by mouth daily with breakfast.   Yes Historical Provider, MD  lovastatin (MEVACOR) 40 MG tablet Take 40 mg by mouth at bedtime.   Yes Historical Provider, MD  multivitamin-iron-minerals-folic acid (CENTRUM) chewable tablet Chew 1 tablet by mouth daily. 07/25/14  Yes Carmin Muskrat, MD  traMADol (ULTRAM) 50 MG tablet Take 1 tablet (50 mg total) by mouth every 6 (six) hours as needed for severe pain. 07/25/14  Yes Carmin Muskrat, MD  atenolol-chlorthalidone (TENORETIC) 100-25 MG per tablet Take 1 tablet by mouth daily.    Historical Provider, MD    Current Facility-Administered Medications  Medication Dose Route Frequency Provider Last Rate Last Dose  . 0.9 %  sodium chloride infusion   Intravenous Once Costin Karlyne Greenspan, MD      . ALPRAZolam Duanne Moron) tablet 1 mg  1 mg Oral TID PRN Caren Griffins, MD   1 mg at 07/29/14 0547  . atenolol (TENORMIN) tablet 100 mg  100 mg Oral Daily Costin Karlyne Greenspan, MD      . ferrous sulfate tablet 325 mg  325 mg Oral Q breakfast Costin  Karlyne Greenspan, MD   325 mg at 07/29/14 0800  . nicotine (NICODERM CQ - dosed in mg/24 hours) patch 21 mg  21 mg Transdermal Daily Costin Karlyne Greenspan, MD   21 mg at 07/28/14 1803  . pantoprazole (PROTONIX) 80 mg in sodium chloride 0.9 % 100 mL IVPB  80 mg Intravenous Once Quintella Reichert, MD      . pravastatin (PRAVACHOL) tablet 40 mg  40 mg Oral q1800 Caren Griffins, MD   40 mg at 07/29/14 0020  . traMADol (ULTRAM) tablet 50 mg  50 mg Oral Q6H PRN Caren Griffins, MD   50 mg at 07/29/14 0546    Allergies as of 07/28/2014  . (No Known Allergies)    History reviewed. No pertinent family  history.  History   Social History  . Marital Status: Married    Spouse Name: N/A  . Number of Children: N/A  . Years of Education: N/A   Occupational History  . Not on file.   Social History Main Topics  . Smoking status: Current Every Day Smoker -- 0.50 packs/day    Types: Cigarettes  . Smokeless tobacco: Never Used  . Alcohol Use: No  . Drug Use: No  . Sexual Activity: No   Other Topics Concern  . Not on file   Social History Narrative    Review of Systems: Gen: Denies any fever, chills, sweats, anorexia, fatigue, weakness, malaise,  and sleep disorder. Has had weight loss. CV: Denies chest pain, angina, palpitations, syncope, orthopnea, PND, peripheral edema, and claudication. Resp: Denies dyspnea at rest, dyspnea with exercise, cough, sputum, wheezing, coughing up blood, and pleurisy. GI: Denies vomiting blood, jaundice, and fecal incontinence.   Denies dysphagia or odynophagia. GU : Denies urinary burning, blood in urine, urinary frequency, urinary hesitancy, nocturnal urination, and urinary incontinence. MS: Denies joint pain, limitation of movement, and swelling, stiffness, low back pain, extremity pain. Denies muscle weakness, cramps, atrophy.  Derm: Denies rash, itching, dry skin, hives, moles, warts, or unhealing ulcers.  Psych: Denies depression, anxiety, memory loss, suicidal ideation, hallucinations, paranoia, and confusion. Heme: Denies bruising, bleeding, and enlarged lymph nodes. Neuro:  Denies any headaches, dizziness, paresthesias. Endo:  Denies any problems with DM, thyroid, adrenal function.  Physical Exam: Vital signs in last 24 hours: Temp:  [97.4 F (36.3 C)-98.8 F (37.1 C)] 97.4 F (36.3 C) (02/25 0600) Pulse Rate:  [52-63] 55 (02/25 0600) Resp:  [16-18] 16 (02/25 0600) BP: (112-148)/(56-76) 148/76 mmHg (02/25 0600) SpO2:  [94 %-100 %] 100 % (02/25 0600) Last BM Date: 07/28/14 General:   Alert,  Well-developed, well-nourished, pleasant and  cooperative in NAD Head:  Normocephalic and atraumatic. Eyes:  Sclera clear, no icterus.   Conjunctiva pink. Ears:  Normal auditory acuity. Nose:  No deformity, discharge,  or lesions. Mouth:  No deformity or lesions.   Neck:  Supple; no masses or thyromegaly. Lungs:  Clear throughout to auscultation. Scattered wheezes cleared with cough  Heart:  Regular rate and rhythm; no murmurs, clicks, rubs,  or gallops. Abdomen:  Soft,tender RUQ with no rebound or guarding BS active,nonpalp mass or hsm.   Rectal:  Deferred  Msk:  Symmetrical without gross deformities. . Pulses:  Normal pulses noted. Extremities:  Without clubbing or edema. Neurologic:  Alert and  oriented x4;  grossly normal neurologically. Skin:  Intact without significant lesions or rashes.. Psych:  Alert and cooperative. Normal mood and affect.  Intake/Output from previous day: 02/24 0701 - 02/25 0700 In: 330 [  Blood:330] Out: 4268 [Urine:1125] Intake/Output this shift:    Lab Results:  Recent Labs  07/28/14 0755 07/28/14 0830 07/29/14 0555  WBC SPECIMEN CLOTTED 18.7* 15.5*  HGB SPECIMEN CLOTTED 7.9* 8.0*  HCT SPECIMEN CLOTTED 26.8* 27.5*  PLT SPECIMEN CLOTTED 526* 488*   BMET  Recent Labs  07/28/14 0830 07/29/14 0555  NA 136 137  K 3.1* 4.0  CL 100 100  CO2 31 29  GLUCOSE 103* 98  BUN 17 19  CREATININE 0.70 0.68  CALCIUM 8.5 8.5   LFT  Recent Labs  07/28/14 0830  PROT 6.7  ALBUMIN 3.4*  AST 16  ALT 10  ALKPHOS 78  BILITOT 0.3      Studies/Results: Ct Abdomen Pelvis W Contrast  07/28/2014   ADDENDUM REPORT: 07/28/2014 11:16  ADDENDUM: Study discussed by telephone with Dr. Quintella Reichert on 07/28/2014 at 1105 hours.   Electronically Signed   By: Genevie Ann M.D.   On: 07/28/2014 11:16   07/28/2014   CLINICAL DATA:  59 year old male with 2 weeks of right side abdominal pain. Recently diagnosed with gallstones. Initial encounter.  EXAM: CT ABDOMEN AND PELVIS WITH CONTRAST  TECHNIQUE: Multidetector  CT imaging of the abdomen and pelvis was performed using the standard protocol following bolus administration of intravenous contrast.  CONTRAST:  65mL OMNIPAQUE IOHEXOL 300 MG/ML SOLN, 29mL OMNIPAQUE IOHEXOL 300 MG/ML SOLN, 131mL OMNIPAQUE IOHEXOL 300 MG/ML SOLN  COMPARISON:  Abdomen ultrasound 07/25/2014.  FINDINGS: Minor atelectasis at the right lung base. No pericardial or pleural effusion.  No acute osseous abnormality identified.  No pelvic free fluid. Retained stool in the rectum. Negative bladder. Moderately redundant sigmoid colon with retained stool. Negative left colon. Moderately redundant splenic flexure.  Abnormal hepatic flexure with elongated circumferential wall thickening and luminal narrowing, but also extensive surrounding hepatic flexure mesentery nodularity and irregular intermediate density. Discrete rounded adjacent mesenteric nodes measuring up to 9 mm diameter are present.  The abnormal pericolic fat continues to the gallbladder fossa. However, the gallbladder itself does not appear distended or definitely inflamed. There is cholelithiasis evident at the gallbladder fundus.  The upstream ascending colon and cecum than have a more normal appearance. There is a gas-filled laterally situated appendix which tracks cephalad. The terminal ileum is within normal limits. Oral contrast has not yet reached the distal small bowel. No dilated small bowel loops. Negative stomach. The second portion of the duodenum also is in close proximity to the abnormal hepatic flexure.  Trace perihepatic free fluid. Multiple nonspecific small low-density lesions in liver such as the 14 mm right hepatic lobe entity on series 2, image 27. Also 8 mm lesion in the left lobe on image 27. Larger 14 mm lesion in the lateral left lobe on image 33.  Spleen, pancreas, right adrenal gland, portal venous system, and kidneys are within normal limits.  There is intermediate density nodularity adjacent to the medial lobe of the  left adrenal gland which is nonspecific. To a degree this resembles an enlarged retroperitoneal lymph node. There is a similar prominent node situated between the cava and right corona of the diaphragm measuring 6 mm.  No pneumoperitoneum. Aortoiliac calcified atherosclerosis noted. Major arterial structures in the abdomen and pelvis are patent.  IMPRESSION: 1. Markedly abnormal hepatic flexure with wall thickening along a 10-12 cm length but extensive surrounding mesenteric nodularity, and increased regional lymph nodes. The appearance is atypical for acute colitis and highly suspicious for adenocarcinoma. GI consultation for followup colonoscopy recommended. 2. Small volume perihepatic  fluid. Multiple up to 14 mm indeterminate low-density lesions in the liver. Several prominent but indeterminate retroperitoneal lymph nodes in the upper abdomen. 3. Abnormal fat stranding/nodularity continues to the gallbladder fossa, but the gallbladder does not appear inflamed.  Electronically Signed: By: Genevie Ann M.D. On: 07/28/2014 11:00    IMPRESSION/PLAN: 59 year old man with a one-week history of right-sided abdominal pain, fatigue, and dyspnea on exertion, found to be anemic and admitted for evaluation. CT scan has revealed marked abnormal thickening of the colon along the hepatic flexure with extensive surrounding mesenteric nodularity concerning for possible adenocarcinoma. Patient is to undergo a colonoscopy for further evaluation of the area today.The risks, benefits, and alternatives to colonoscopy with possible biopsy and possible polypectomy were discussed with the patient and they consent to proceed.    Hvozdovic, Deloris Ping 07/29/2014,  Pager [phone]     Attending physician's note   I have taken a history, examined the patient and reviewed the chart. I agree with the Advanced Practitioner's note, impression and recommendations. RUQ/R flank pain, hematochezia, microcytic anemia, abnormal CT of hepatic  flexure. 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 Send Fe studies. Colonoscopy today. The risks (including bleeding, perforation, infection, missed lesions, medication reactions and possible hospitalization or surgery if complications occur), benefits, and alternatives to colonoscopy with possible biopsy and possible polypectomy were discussed with the patient and they consent to proceed.    Ladene Artist, MD Marval Regal

## 2014-07-30 ENCOUNTER — Inpatient Hospital Stay (HOSPITAL_COMMUNITY): Payer: Medicaid Other | Admitting: Anesthesiology

## 2014-07-30 ENCOUNTER — Encounter (HOSPITAL_COMMUNITY)
Admission: EM | Disposition: A | Discharge status: Home Health | Payer: Self-pay | Source: Home / Self Care | Attending: Internal Medicine

## 2014-07-30 ENCOUNTER — Encounter (HOSPITAL_COMMUNITY): Payer: Self-pay | Admitting: Gastroenterology

## 2014-07-30 ENCOUNTER — Inpatient Hospital Stay (HOSPITAL_COMMUNITY): Payer: Medicaid Other

## 2014-07-30 DIAGNOSIS — E785 Hyperlipidemia, unspecified: Secondary | ICD-10-CM

## 2014-07-30 DIAGNOSIS — K6389 Other specified diseases of intestine: Secondary | ICD-10-CM

## 2014-07-30 HISTORY — PX: CHOLECYSTECTOMY: SHX55

## 2014-07-30 HISTORY — PX: LAPAROSCOPIC PARTIAL COLECTOMY: SHX5907

## 2014-07-30 LAB — POCT I-STAT 4, (NA,K, GLUC, HGB,HCT)
Glucose, Bld: 150 mg/dL — ABNORMAL HIGH (ref 70–99)
HCT: 27 % — ABNORMAL LOW (ref 39.0–52.0)
HEMOGLOBIN: 9.2 g/dL — AB (ref 13.0–17.0)
POTASSIUM: 3.7 mmol/L (ref 3.5–5.1)
SODIUM: 140 mmol/L (ref 135–145)

## 2014-07-30 LAB — BASIC METABOLIC PANEL
ANION GAP: 7 (ref 5–15)
BUN: 14 mg/dL (ref 6–23)
CO2: 29 mmol/L (ref 19–32)
CREATININE: 0.68 mg/dL (ref 0.50–1.35)
Calcium: 8.1 mg/dL — ABNORMAL LOW (ref 8.4–10.5)
Chloride: 103 mmol/L (ref 96–112)
GFR calc Af Amer: >90 mL/min (ref >90)
Glucose, Bld: 100 mg/dL — ABNORMAL HIGH (ref 70–99)
POTASSIUM: 3.4 mmol/L — AB (ref 3.5–5.1)
Sodium: 139 mmol/L (ref 135–145)

## 2014-07-30 LAB — CBC
HEMATOCRIT: 26.2 % — AB (ref 39.0–52.0)
Hemoglobin: 7.7 g/dL — ABNORMAL LOW (ref 13.0–17.0)
MCH: 23.2 pg — ABNORMAL LOW (ref 26.0–34.0)
MCHC: 29.4 g/dL — ABNORMAL LOW (ref 30.0–36.0)
MCV: 78.9 fL (ref 78.0–100.0)
Platelets: 427 10*3/uL — ABNORMAL HIGH (ref 150–400)
RBC: 3.32 MIL/uL — AB (ref 4.22–5.81)
RDW: 16.2 % — ABNORMAL HIGH (ref 11.5–15.5)
WBC: 13.2 10*3/uL — AB (ref 4.0–10.5)

## 2014-07-30 LAB — PREPARE RBC (CROSSMATCH)

## 2014-07-30 LAB — APTT: APTT: 35 s (ref 24–37)

## 2014-07-30 LAB — GLUCOSE, CAPILLARY: Glucose-Capillary: 150 mg/dL — ABNORMAL HIGH (ref 70–99)

## 2014-07-30 SURGERY — LAPAROSCOPIC PARTIAL COLECTOMY
Anesthesia: General

## 2014-07-30 MED ORDER — PROMETHAZINE HCL 25 MG/ML IJ SOLN: 6.2500 mg | INTRAMUSCULAR | Status: DC | PRN

## 2014-07-30 MED ORDER — LIDOCAINE HCL (CARDIAC) 20 MG/ML IV SOLN
INTRAVENOUS | Status: DC | PRN
  Administered 2014-07-30: 80 mg via INTRAVENOUS

## 2014-07-30 MED ORDER — BUPIVACAINE-EPINEPHRINE 0.5% -1:200000 IJ SOLN
INTRAMUSCULAR | Status: DC | PRN
  Administered 2014-07-30: 30 mL

## 2014-07-30 MED ORDER — NALOXONE HCL 0.4 MG/ML IJ SOLN: 0.4000 mg | INTRAMUSCULAR | Status: DC | PRN

## 2014-07-30 MED ORDER — HYDROMORPHONE HCL 1 MG/ML IJ SOLN
INTRAMUSCULAR | Status: AC
  Filled 2014-07-30: qty 1

## 2014-07-30 MED ORDER — HYDROMORPHONE HCL 1 MG/ML IJ SOLN
INTRAMUSCULAR | Status: DC | PRN
  Administered 2014-07-30 (×2): 1 mg via INTRAVENOUS

## 2014-07-30 MED ORDER — SODIUM CHLORIDE 0.9 % IV SOLN: Freq: Once | INTRAVENOUS | Status: DC

## 2014-07-30 MED ORDER — GLYCOPYRROLATE 0.2 MG/ML IJ SOLN
INTRAMUSCULAR | Status: AC
  Filled 2014-07-30: qty 3

## 2014-07-30 MED ORDER — SUCCINYLCHOLINE CHLORIDE 20 MG/ML IJ SOLN
INTRAMUSCULAR | Status: DC | PRN
  Administered 2014-07-30: 100 mg via INTRAVENOUS

## 2014-07-30 MED ORDER — MORPHINE SULFATE (PF) 1 MG/ML IV SOLN
INTRAVENOUS | Status: AC
  Filled 2014-07-30: qty 25

## 2014-07-30 MED ORDER — SODIUM CHLORIDE 0.9 % IJ SOLN: 9.0000 mL | INTRAMUSCULAR | Status: DC | PRN

## 2014-07-30 MED ORDER — LACTATED RINGERS IR SOLN
Status: DC | PRN
  Administered 2014-07-30: 3000 mL

## 2014-07-30 MED ORDER — LACTATED RINGERS IV SOLN: INTRAVENOUS | Status: DC

## 2014-07-30 MED ORDER — ONDANSETRON HCL 4 MG/2ML IJ SOLN: 4.0000 mg | Freq: Four times a day (QID) | INTRAMUSCULAR | Status: DC | PRN

## 2014-07-30 MED ORDER — DIPHENHYDRAMINE HCL 50 MG/ML IJ SOLN: 12.5000 mg | Freq: Four times a day (QID) | INTRAMUSCULAR | Status: DC | PRN

## 2014-07-30 MED ORDER — ROCURONIUM BROMIDE 100 MG/10ML IV SOLN
INTRAVENOUS | Status: DC | PRN
  Administered 2014-07-30 (×2): 10 mg via INTRAVENOUS
  Administered 2014-07-30: 50 mg via INTRAVENOUS
  Administered 2014-07-30: 20 mg via INTRAVENOUS
  Administered 2014-07-30 (×2): 10 mg via INTRAVENOUS

## 2014-07-30 MED ORDER — DEXTROSE 5 % IV SOLN
2.0000 g | Freq: Two times a day (BID) | INTRAVENOUS | Status: AC
  Administered 2014-07-31: 2 g via INTRAVENOUS
  Filled 2014-07-30: qty 2

## 2014-07-30 MED ORDER — LACTATED RINGERS IV SOLN
INTRAVENOUS | Status: DC
  Administered 2014-07-30: 1000 mL via INTRAVENOUS

## 2014-07-30 MED ORDER — FENTANYL CITRATE 0.05 MG/ML IJ SOLN
INTRAMUSCULAR | Status: DC | PRN
  Administered 2014-07-30 (×5): 50 ug via INTRAVENOUS

## 2014-07-30 MED ORDER — FENTANYL CITRATE 0.05 MG/ML IJ SOLN
INTRAMUSCULAR | Status: AC
  Filled 2014-07-30: qty 5

## 2014-07-30 MED ORDER — GLYCOPYRROLATE 0.2 MG/ML IJ SOLN
INTRAMUSCULAR | Status: DC | PRN
  Administered 2014-07-30: 0.6 mg via INTRAVENOUS

## 2014-07-30 MED ORDER — OXYCODONE HCL 5 MG PO TABS: 5.0000 mg | ORAL_TABLET | Freq: Once | ORAL | Status: DC | PRN

## 2014-07-30 MED ORDER — SODIUM CHLORIDE 0.9 % IV SOLN
INTRAVENOUS | Status: DC | PRN
  Administered 2014-07-30: 16:00:00 via INTRAVENOUS

## 2014-07-30 MED ORDER — DEXTROSE 5 % IV SOLN
10.0000 mg | INTRAVENOUS | Status: DC | PRN
  Administered 2014-07-30: 40 ug/min via INTRAVENOUS

## 2014-07-30 MED ORDER — ROCURONIUM BROMIDE 100 MG/10ML IV SOLN
INTRAVENOUS | Status: AC
  Filled 2014-07-30: qty 1

## 2014-07-30 MED ORDER — DEXAMETHASONE SODIUM PHOSPHATE 10 MG/ML IJ SOLN
INTRAMUSCULAR | Status: DC | PRN
  Administered 2014-07-30: 10 mg via INTRAVENOUS

## 2014-07-30 MED ORDER — ONDANSETRON HCL 4 MG/2ML IJ SOLN
INTRAMUSCULAR | Status: AC
  Filled 2014-07-30: qty 2

## 2014-07-30 MED ORDER — POTASSIUM CHLORIDE IN NACL 20-0.9 MEQ/L-% IV SOLN
INTRAVENOUS | Status: DC
  Filled 2014-07-30 (×2): qty 1000

## 2014-07-30 MED ORDER — MIDAZOLAM HCL 2 MG/2ML IJ SOLN
INTRAMUSCULAR | Status: AC
  Filled 2014-07-30: qty 2

## 2014-07-30 MED ORDER — HYDROMORPHONE HCL 2 MG/ML IJ SOLN
INTRAMUSCULAR | Status: AC
  Filled 2014-07-30: qty 1

## 2014-07-30 MED ORDER — NEOSTIGMINE METHYLSULFATE 10 MG/10ML IV SOLN
INTRAVENOUS | Status: AC
  Filled 2014-07-30: qty 1

## 2014-07-30 MED ORDER — BUPIVACAINE-EPINEPHRINE (PF) 0.5% -1:200000 IJ SOLN
INTRAMUSCULAR | Status: AC
  Filled 2014-07-30: qty 30

## 2014-07-30 MED ORDER — MORPHINE SULFATE (PF) 1 MG/ML IV SOLN
INTRAVENOUS | Status: DC
  Administered 2014-07-30: 1.5 mg via INTRAVENOUS
  Administered 2014-07-30: 4.5 mg via INTRAVENOUS
  Administered 2014-07-31: 12 mg via INTRAVENOUS
  Administered 2014-07-31 (×2): via INTRAVENOUS
  Administered 2014-07-31: 7.5 mg via INTRAVENOUS
  Administered 2014-07-31: 17.39 mg via INTRAVENOUS
  Administered 2014-07-31: 27.4 mg via INTRAVENOUS
  Administered 2014-07-31: 0 mg via INTRAVENOUS
  Administered 2014-07-31: 23:00:00 via INTRAVENOUS
  Administered 2014-08-01: 7.5 mg via INTRAVENOUS
  Administered 2014-08-01: 4.5 mg via INTRAVENOUS
  Administered 2014-08-01: 9.98 mg via INTRAVENOUS
  Administered 2014-08-01: 19.5 mg via INTRAVENOUS
  Administered 2014-08-01: 21:00:00 via INTRAVENOUS
  Administered 2014-08-02: 18.97 mg via INTRAVENOUS
  Administered 2014-08-02: 0 mg via INTRAVENOUS
  Administered 2014-08-02: 3 mg via INTRAVENOUS
  Administered 2014-08-02: 18:00:00 via INTRAVENOUS
  Administered 2014-08-02: 9 mg via INTRAVENOUS
  Administered 2014-08-02: 10.47 mg via INTRAVENOUS
  Administered 2014-08-02: 7.5 mg via INTRAVENOUS
  Administered 2014-08-03: 16:00:00 via INTRAVENOUS
  Administered 2014-08-03: 10.47 mg via INTRAVENOUS
  Administered 2014-08-03: 9.45 mg via INTRAVENOUS
  Administered 2014-08-03: 0 mg via INTRAVENOUS
  Administered 2014-08-03: 06:00:00 via INTRAVENOUS
  Administered 2014-08-04: 3 mg via INTRAVENOUS
  Administered 2014-08-04: 5.73 mg via INTRAVENOUS
  Administered 2014-08-04: 1 mg via INTRAVENOUS
  Administered 2014-08-04: 13.5 mg via INTRAVENOUS
  Administered 2014-08-04: 10.5 mg via INTRAVENOUS
  Administered 2014-08-04: 4.5 mg via INTRAVENOUS
  Administered 2014-08-04: 10.5 mg via INTRAVENOUS
  Administered 2014-08-05: 3 mg via INTRAVENOUS
  Administered 2014-08-05: 10:00:00 via INTRAVENOUS
  Administered 2014-08-05 (×2): 7.5 mg via INTRAVENOUS
  Administered 2014-08-05: 22.19 mg via INTRAVENOUS
  Administered 2014-08-05: 17:00:00 via INTRAVENOUS
  Administered 2014-08-05: 0 mg via INTRAVENOUS
  Administered 2014-08-05: 10.5 mg via INTRAVENOUS
  Administered 2014-08-06: 20:00:00 via INTRAVENOUS
  Administered 2014-08-06: 6 mg via INTRAVENOUS
  Administered 2014-08-06: 1.5 mg via INTRAVENOUS
  Administered 2014-08-06: 0 mg via INTRAVENOUS
  Administered 2014-08-07 (×3): 7.5 mg via INTRAVENOUS
  Filled 2014-07-30 (×15): qty 25

## 2014-07-30 MED ORDER — PIPERACILLIN-TAZOBACTAM 3.375 G IVPB
INTRAVENOUS | Status: AC
  Filled 2014-07-30: qty 50

## 2014-07-30 MED ORDER — PANTOPRAZOLE SODIUM 40 MG IV SOLR
40.0000 mg | INTRAVENOUS | Status: DC
  Administered 2014-07-30 – 2014-08-07 (×9): 40 mg via INTRAVENOUS
  Filled 2014-07-30 (×9): qty 40

## 2014-07-30 MED ORDER — PROPOFOL 10 MG/ML IV BOLUS
INTRAVENOUS | Status: AC
  Filled 2014-07-30: qty 20

## 2014-07-30 MED ORDER — OXYCODONE HCL 5 MG/5ML PO SOLN: 5.0000 mg | Freq: Once | ORAL | Status: DC | PRN

## 2014-07-30 MED ORDER — HYDROMORPHONE HCL 1 MG/ML IJ SOLN
0.2500 mg | INTRAMUSCULAR | Status: DC | PRN
  Administered 2014-07-30: 0.5 mg via INTRAVENOUS
  Administered 2014-07-30: 0.25 mg via INTRAVENOUS
  Administered 2014-07-30: 0.5 mg via INTRAVENOUS

## 2014-07-30 MED ORDER — NEOSTIGMINE METHYLSULFATE 10 MG/10ML IV SOLN
INTRAVENOUS | Status: DC | PRN
  Administered 2014-07-30: 3.5 mg via INTRAVENOUS

## 2014-07-30 MED ORDER — ONDANSETRON HCL 4 MG/2ML IJ SOLN
4.0000 mg | INTRAMUSCULAR | Status: DC | PRN
  Administered 2014-08-09 (×2): 4 mg via INTRAVENOUS
  Filled 2014-07-30 (×2): qty 2

## 2014-07-30 MED ORDER — ONDANSETRON HCL 4 MG PO TABS: 4.0000 mg | ORAL_TABLET | Freq: Four times a day (QID) | ORAL | Status: DC | PRN

## 2014-07-30 MED ORDER — LIDOCAINE HCL (CARDIAC) 20 MG/ML IV SOLN
INTRAVENOUS | Status: AC
  Filled 2014-07-30: qty 5

## 2014-07-30 MED ORDER — DIPHENHYDRAMINE HCL 12.5 MG/5ML PO ELIX: 12.5000 mg | ORAL_SOLUTION | Freq: Four times a day (QID) | ORAL | Status: DC | PRN

## 2014-07-30 MED ORDER — MORPHINE SULFATE 2 MG/ML IJ SOLN
1.0000 mg | INTRAMUSCULAR | Status: DC | PRN
  Administered 2014-07-30 (×2): 2 mg via INTRAVENOUS
  Filled 2014-07-30 (×2): qty 1

## 2014-07-30 MED ORDER — LACTATED RINGERS IV SOLN
INTRAVENOUS | Status: DC | PRN
  Administered 2014-07-30 (×3): via INTRAVENOUS

## 2014-07-30 MED ORDER — KCL IN DEXTROSE-NACL 20-5-0.9 MEQ/L-%-% IV SOLN
INTRAVENOUS | Status: DC
  Administered 2014-07-30 – 2014-08-08 (×15): via INTRAVENOUS
  Filled 2014-07-30 (×23): qty 1000

## 2014-07-30 MED ORDER — LORAZEPAM 2 MG/ML IJ SOLN
1.0000 mg | Freq: Once | INTRAMUSCULAR | Status: AC
  Administered 2014-07-30: 1 mg via INTRAVENOUS
  Filled 2014-07-30: qty 1

## 2014-07-30 MED ORDER — 0.9 % SODIUM CHLORIDE (POUR BTL) OPTIME
TOPICAL | Status: DC | PRN
  Administered 2014-07-30: 5000 mL

## 2014-07-30 MED ORDER — PROPOFOL 10 MG/ML IV BOLUS
INTRAVENOUS | Status: DC | PRN
  Administered 2014-07-30: 150 mg via INTRAVENOUS
  Administered 2014-07-30: 20 mg via INTRAVENOUS

## 2014-07-30 SURGICAL SUPPLY — 92 items
APPLIER CLIP 5 13 M/L LIGAMAX5 (MISCELLANEOUS) ×3
APPLIER CLIP ROT 10 11.4 M/L (STAPLE) ×3
BENZOIN TINCTURE PRP APPL 2/3 (GAUZE/BANDAGES/DRESSINGS) ×3 IMPLANT
BLADE EXTENDED COATED 6.5IN (ELECTRODE) ×3 IMPLANT
BLADE HEX COATED 2.75 (ELECTRODE) IMPLANT
CABLE HIGH FREQUENCY MONO STRZ (ELECTRODE) ×3 IMPLANT
CELLS DAT CNTRL 66122 CELL SVR (MISCELLANEOUS) IMPLANT
CHLORAPREP W/TINT 26ML (MISCELLANEOUS) IMPLANT
CLIP APPLIE 5 13 M/L LIGAMAX5 (MISCELLANEOUS) ×1 IMPLANT
CLIP APPLIE ROT 10 11.4 M/L (STAPLE) ×1 IMPLANT
CLOSURE WOUND 1/2 X4 (GAUZE/BANDAGES/DRESSINGS) ×1
COUNTER NEEDLE 20 DBL MAG RED (NEEDLE) ×3 IMPLANT
COVER BACK TABLE 60X90IN (DRAPES) ×3 IMPLANT
COVER MAYO STAND STRL (DRAPES) ×3 IMPLANT
DECANTER SPIKE VIAL GLASS SM (MISCELLANEOUS) IMPLANT
DEVICE SUTURE ENDOST 10MM (ENDOMECHANICALS) ×3 IMPLANT
DISSECTOR BLUNT TIP ENDO 5MM (MISCELLANEOUS) IMPLANT
DRAIN CHANNEL 19F RND (DRAIN) ×3 IMPLANT
DRAPE C-ARM 42X120 X-RAY (DRAPES) IMPLANT
DRAPE LAPAROSCOPIC ABDOMINAL (DRAPES) ×3 IMPLANT
DRAPE UTILITY XL STRL (DRAPES) IMPLANT
DRSG OPSITE POSTOP 4X10 (GAUZE/BANDAGES/DRESSINGS) IMPLANT
DRSG OPSITE POSTOP 4X6 (GAUZE/BANDAGES/DRESSINGS) IMPLANT
DRSG OPSITE POSTOP 4X8 (GAUZE/BANDAGES/DRESSINGS) IMPLANT
DRSG TEGADERM 2-3/8X2-3/4 SM (GAUZE/BANDAGES/DRESSINGS) IMPLANT
ELECT REM PT RETURN 9FT ADLT (ELECTROSURGICAL) ×3
ELECTRODE REM PT RTRN 9FT ADLT (ELECTROSURGICAL) ×1 IMPLANT
ENDOLOOP SUT PDS II  0 18 (SUTURE)
ENDOLOOP SUT PDS II 0 18 (SUTURE) IMPLANT
EVACUATOR SILICONE 100CC (DRAIN) ×3 IMPLANT
FILTER SMOKE EVAC LAPAROSHD (FILTER) IMPLANT
GAUZE SPONGE 2X2 8PLY STRL LF (GAUZE/BANDAGES/DRESSINGS) ×1 IMPLANT
GAUZE SPONGE 4X4 12PLY STRL (GAUZE/BANDAGES/DRESSINGS) ×3 IMPLANT
GLOVE ECLIPSE 8.0 STRL XLNG CF (GLOVE) ×18 IMPLANT
GLOVE INDICATOR 8.0 STRL GRN (GLOVE) ×18 IMPLANT
GOWN STRL REUS W/TWL XL LVL3 (GOWN DISPOSABLE) ×24 IMPLANT
HEMOSTAT SNOW SURGICEL 2X4 (HEMOSTASIS) IMPLANT
HEMOSTAT SURGICEL 4X8 (HEMOSTASIS) ×3 IMPLANT
KIT BASIN OR (CUSTOM PROCEDURE TRAY) IMPLANT
LEGGING LITHOTOMY PAIR STRL (DRAPES) ×3 IMPLANT
LIGASURE IMPACT 36 18CM CVD LR (INSTRUMENTS) ×3 IMPLANT
LOOP VESSEL MAXI BLUE (MISCELLANEOUS) ×3 IMPLANT
MANIFOLD NEPTUNE II (INSTRUMENTS) ×3 IMPLANT
PACK COLON (CUSTOM PROCEDURE TRAY) ×3 IMPLANT
POUCH SPECIMEN RETRIEVAL 10MM (ENDOMECHANICALS) IMPLANT
RELOAD PROXIMATE 75MM BLUE (ENDOMECHANICALS) ×6 IMPLANT
RTRCTR WOUND ALEXIS 18CM MED (MISCELLANEOUS)
SCISSORS LAP 5X35 DISP (ENDOMECHANICALS) ×3 IMPLANT
SET CHOLANGIOGRAPH MIX (MISCELLANEOUS) ×3 IMPLANT
SET IRRIG TUBING LAPAROSCOPIC (IRRIGATION / IRRIGATOR) ×3 IMPLANT
SHEARS HARMONIC ACE PLUS 36CM (ENDOMECHANICALS) IMPLANT
SHEARS HARMONIC ACE PLUS 45CM (MISCELLANEOUS) IMPLANT
SLEEVE XCEL OPT CAN 5 100 (ENDOMECHANICALS) ×6 IMPLANT
SOLUTION ANTI FOG 6CC (MISCELLANEOUS) IMPLANT
SPONGE DRAIN TRACH 4X4 STRL 2S (GAUZE/BANDAGES/DRESSINGS) ×3 IMPLANT
SPONGE GAUZE 2X2 STER 10/PKG (GAUZE/BANDAGES/DRESSINGS) ×2
SPONGE LAP 18X18 X RAY DECT (DISPOSABLE) ×6 IMPLANT
STAPLER 90 3.5 STAND SLIM (STAPLE) ×3
STAPLER 90 3.5 STD SLIM (STAPLE) ×1 IMPLANT
STAPLER PROXIMATE 75MM BLUE (STAPLE) ×3 IMPLANT
STAPLER VISISTAT 35W (STAPLE) ×3 IMPLANT
STRIP CLOSURE SKIN 1/2X4 (GAUZE/BANDAGES/DRESSINGS) ×2 IMPLANT
SUCTION POOLE TIP (SUCTIONS) IMPLANT
SUT ETHILON 3 0 PS 1 (SUTURE) ×3 IMPLANT
SUT MNCRL AB 4-0 PS2 18 (SUTURE) IMPLANT
SUT PDS AB 1 CTX 36 (SUTURE) IMPLANT
SUT PDS AB 1 TP1 96 (SUTURE) ×6 IMPLANT
SUT PROLENE 2 0 KS (SUTURE) IMPLANT
SUT PROLENE 2 0 SH DA (SUTURE) IMPLANT
SUT PROLENE 4 0 RB 1 (SUTURE) ×8
SUT PROLENE 4-0 RB1 .5 CRCL 36 (SUTURE) ×4 IMPLANT
SUT SILK 2 0 (SUTURE) ×2
SUT SILK 2 0 SH CR/8 (SUTURE) ×3 IMPLANT
SUT SILK 2-0 18XBRD TIE 12 (SUTURE) ×1 IMPLANT
SUT SILK 3 0 (SUTURE) ×2
SUT SILK 3 0 SH CR/8 (SUTURE) ×6 IMPLANT
SUT SILK 3-0 18XBRD TIE 12 (SUTURE) ×1 IMPLANT
SUT SURGIDAC NAB ES-9 0 48 120 (SUTURE) ×9 IMPLANT
SUT VICRYL 0 UR6 27IN ABS (SUTURE) ×3 IMPLANT
SUT VICRYL 2 0 18  UND BR (SUTURE)
SUT VICRYL 2 0 18 UND BR (SUTURE) IMPLANT
SYS LAPSCP GELPORT 120MM (MISCELLANEOUS)
SYSTEM LAPSCP GELPORT 120MM (MISCELLANEOUS) IMPLANT
TAPE CLOTH SURG 4X10 WHT LF (GAUZE/BANDAGES/DRESSINGS) ×3 IMPLANT
TOWEL OR 17X26 10 PK STRL BLUE (TOWEL DISPOSABLE) ×6 IMPLANT
TOWEL OR NON WOVEN STRL DISP B (DISPOSABLE) IMPLANT
TRAY FOLEY CATH 14FRSI W/METER (CATHETERS) IMPLANT
TRAY LAPAROSCOPIC (CUSTOM PROCEDURE TRAY) IMPLANT
TROCAR BLADELESS OPT 5 100 (ENDOMECHANICALS) ×3 IMPLANT
TROCAR XCEL BLUNT TIP 100MML (ENDOMECHANICALS) ×3 IMPLANT
TROCAR XCEL NON-BLD 11X100MML (ENDOMECHANICALS) IMPLANT
TUBING INSUFFLATION 10FT LAP (TUBING) ×3 IMPLANT

## 2014-07-30 NOTE — Transfer of Care (Signed)
Immediate Anesthesia Transfer of Care Note  Patient: Donald Fry  Procedure(s) Performed: Procedure(s) (LRB): LAPAROSCOPY CONVERTED TO EXPLORATORY LAPAROTOMY EXTENDED RIGHT COLECTOMY BIOPSY OF DIAPHRAGMATIC IMPLANT CLOSURE OF DIAPHRAGM CHOLECYSTECTOMY (N/A) LAPAROSCOPIC CHOLECYSTECTOMY WITH INTRAOPERATIVE CHOLANGIOGRAM (N/A)  Patient Location: PACU  Anesthesia Type: General  Level of Consciousness: sedated, patient cooperative and responds to stimulation  Airway & Oxygen Therapy: Patient Spontanous Breathing and Patient connected to face mask oxgen  Post-op Assessment: Report given to PACU RN and Post -op Vital signs reviewed and stable  Post vital signs: Reviewed and stable  Complications: No apparent anesthesia complications

## 2014-07-30 NOTE — Progress Notes (Signed)
1 Day Post-Op  Subjective: He is a nervous wreck.  He takes his Xanax 1 mg TID at home.  So I am going to give him some now IV.  We plan surgery later this AM.  Objective: Vital signs in last 24 hours: Temp:  [97.3 F (36.3 C)-99 F (37.2 C)] 98.3 F (36.8 C) (02/26 0610) Pulse Rate:  [51-66] 66 (02/26 0610) Resp:  [11-21] 16 (02/26 0610) BP: (108-147)/(46-71) 113/54 mmHg (02/26 0610) SpO2:  [96 %-100 %] 97 % (02/26 0610) Weight:  [163 lb (73.936 kg)] 163 lb (73.936 kg) (02/25 0945) Last BM Date: 07/29/14 660 Po yesterday, no BM recorded. Afebrile, VSS K+ 3.4 WBC is better at 13.2 H/H 7.7/26.2 INR 1.12 PTT 35 Intake/Output from previous day: 02/25 0701 - 02/26 0700 In: 660 [P.O.:660] Out: 1550 [Urine:1550] Intake/Output this shift: Total I/O In: -  Out: 300 [Urine:300]  General appearance: alert, cooperative and Very anxious Resp: clear to auscultation bilaterally GI: soft a little tender, + BS.   Lab Results:   Recent Labs  07/29/14 0555 07/30/14 0540  WBC 15.5* 13.2*  HGB 8.0* 7.7*  HCT 27.5* 26.2*  PLT 488* 427*    BMET  Recent Labs  07/29/14 0555 07/30/14 0540  NA 137 139  K 4.0 3.4*  CL 100 103  CO2 29 29  GLUCOSE 98 100*  BUN 19 14  CREATININE 0.68 0.68  CALCIUM 8.5 8.1*   PT/INR  Recent Labs  07/29/14 1325  LABPROT 14.5  INR 1.12     Recent Labs Lab 07/25/14 0850 07/28/14 0830  AST 17 16  ALT 11 10  ALKPHOS 99 78  BILITOT 0.4 0.3  PROT 7.5 6.7  ALBUMIN 3.9 3.4*     Lipase     Component Value Date/Time   LIPASE 20 07/28/2014 0830     Studies/Results: Ct Abdomen Pelvis W Contrast  07/28/2014   ADDENDUM REPORT: 07/28/2014 11:16  ADDENDUM: Study discussed by telephone with Dr. Quintella Reichert on 07/28/2014 at 1105 hours.   Electronically Signed   By: Genevie Ann M.D.   On: 07/28/2014 11:16   07/28/2014   CLINICAL DATA:  59 year old male with 2 weeks of right side abdominal pain. Recently diagnosed with gallstones. Initial  encounter.  EXAM: CT ABDOMEN AND PELVIS WITH CONTRAST  TECHNIQUE: Multidetector CT imaging of the abdomen and pelvis was performed using the standard protocol following bolus administration of intravenous contrast.  CONTRAST:  42mL OMNIPAQUE IOHEXOL 300 MG/ML SOLN, 68mL OMNIPAQUE IOHEXOL 300 MG/ML SOLN, 115mL OMNIPAQUE IOHEXOL 300 MG/ML SOLN  COMPARISON:  Abdomen ultrasound 07/25/2014.  FINDINGS: Minor atelectasis at the right lung base. No pericardial or pleural effusion.  No acute osseous abnormality identified.  No pelvic free fluid. Retained stool in the rectum. Negative bladder. Moderately redundant sigmoid colon with retained stool. Negative left colon. Moderately redundant splenic flexure.  Abnormal hepatic flexure with elongated circumferential wall thickening and luminal narrowing, but also extensive surrounding hepatic flexure mesentery nodularity and irregular intermediate density. Discrete rounded adjacent mesenteric nodes measuring up to 9 mm diameter are present.  The abnormal pericolic fat continues to the gallbladder fossa. However, the gallbladder itself does not appear distended or definitely inflamed. There is cholelithiasis evident at the gallbladder fundus.  The upstream ascending colon and cecum than have a more normal appearance. There is a gas-filled laterally situated appendix which tracks cephalad. The terminal ileum is within normal limits. Oral contrast has not yet reached the distal small bowel. No dilated small bowel  loops. Negative stomach. The second portion of the duodenum also is in close proximity to the abnormal hepatic flexure.  Trace perihepatic free fluid. Multiple nonspecific small low-density lesions in liver such as the 14 mm right hepatic lobe entity on series 2, image 27. Also 8 mm lesion in the left lobe on image 27. Larger 14 mm lesion in the lateral left lobe on image 33.  Spleen, pancreas, right adrenal gland, portal venous system, and kidneys are within normal limits.   There is intermediate density nodularity adjacent to the medial lobe of the left adrenal gland which is nonspecific. To a degree this resembles an enlarged retroperitoneal lymph node. There is a similar prominent node situated between the cava and right corona of the diaphragm measuring 6 mm.  No pneumoperitoneum. Aortoiliac calcified atherosclerosis noted. Major arterial structures in the abdomen and pelvis are patent.  IMPRESSION: 1. Markedly abnormal hepatic flexure with wall thickening along a 10-12 cm length but extensive surrounding mesenteric nodularity, and increased regional lymph nodes. The appearance is atypical for acute colitis and highly suspicious for adenocarcinoma. GI consultation for followup colonoscopy recommended. 2. Small volume perihepatic fluid. Multiple up to 14 mm indeterminate low-density lesions in the liver. Several prominent but indeterminate retroperitoneal lymph nodes in the upper abdomen. 3. Abnormal fat stranding/nodularity continues to the gallbladder fossa, but the gallbladder does not appear inflamed.  Electronically Signed: By: Genevie Ann M.D. On: 07/28/2014 11:00    Medications: . sodium chloride   Intravenous Once  . atenolol  100 mg Oral Daily  . ferrous sulfate  325 mg Oral Q breakfast  . nicotine  21 mg Transdermal Daily  . piperacillin-tazobactam (ZOSYN)  IV  3.375 g Intravenous 3 times per day  . pravastatin  40 mg Oral q1800   . 0.9 % NaCl with KCl 20 mEq / L  100 Ml/hr   Prior to Admission medications   Medication Sig Start Date End Date Taking? Authorizing Provider  ALPRAZolam Duanne Moron) 1 MG tablet Take 1 mg by mouth 3 (three) times daily.   Yes Historical Provider, MD  aspirin 81 MG tablet Take 81 mg by mouth daily.   Yes Historical Provider, MD  atenolol (TENORMIN) 100 MG tablet Take 100 mg by mouth daily.   Yes Historical Provider, MD  ferrous sulfate 325 (65 FE) MG tablet Take 325 mg by mouth daily with breakfast.   Yes Historical Provider, MD   lovastatin (MEVACOR) 40 MG tablet Take 40 mg by mouth at bedtime.   Yes Historical Provider, MD  multivitamin-iron-minerals-folic acid (CENTRUM) chewable tablet Chew 1 tablet by mouth daily. 07/25/14  Yes Carmin Muskrat, MD  traMADol (ULTRAM) 50 MG tablet Take 1 tablet (50 mg total) by mouth every 6 (six) hours as needed for severe pain. 07/25/14  Yes Carmin Muskrat, MD  atenolol-chlorthalidone (TENORETIC) 100-25 MG per tablet Take 1 tablet by mouth daily.    Historical Provider, MD     Assessment/Plan 1. Large circumferential bleeding, partially obstructing friable mass at the Hepatic Flexure of the colon-suspicious for cancer; CT shows inflammatory changes concerning for a microperf. He also has cholelithiasis Path is still pending   2. Anemia/Leukocytosis 3. Hypertension 4. Tobacco use 39 years 5. Hx of ETOH use quit 4 years ago 6. Dyslipidemia  7.  DVT prophylaxis  -  SCD 8.  He had two doses of Zosyn day 1 -  07/29/14. 9.  Chronic benzodiazapine use - very anxious NPO without the drug.  Plan:  Surgery later  this AM, Xanax for chronic use to prevent withdrawal symptoms.  He has blood being set up in labs for use later as needed.    LOS: 2 days    Zakiyyah Savannah 07/30/2014

## 2014-07-30 NOTE — Op Note (Signed)
Operative Note  Donald Fry male 59 y.o. 07/30/2014  PREOPERATIVE DX:  Bleeding right colon mass, cholelithiasis  POSTOPERATIVE DX:  Right colon tumor, peritoneal implants and right upper quadrant, right lateral abdominal wall, and on gallbladder, cholelithiasis  PROCEDURE:   Laparoscopy converted to exploratory laparotomy, extended right colectomy, biopsy of peritoneal nodules on diaphragm with closure of diaphragm, cholecystectomy.         Surgeon: [name]   Assistants: Leighton Ruff, MD  Anesthesia: General endotracheal anesthesia  Indications:   This is a 59 year old male male with right upper quadrant pain and anemia. CT scan demonstrated a large tumor-like mass in the hepatic flexure area. Colonoscopy was performed and demonstrated a partially obstructing tumor at the hepatic flexure. Biopsy was taken but is pending. The lesion is bleeding. He also has cholelithiasis. He is now brought to the operating room.    Procedure Detail:  He was brought to the operating room placed supine on the operating table and a general anesthetic was given. A Foley catheter was inserted. A nasogastric tube was inserted. The hair and the abdominal wall was clipped. The abdominal wall was sterilely prepped and draped.  A small subumbilical incision was made through the skin, subcutaneous tissue, fascia, and peritoneum. The peritoneal cavity was entered under direct vision. A pursestring suture of 0 Vicryl was placed around the edges of the fascia. A Hassan trocar was introduced into the peritoneal cavity and a pneumoperitoneum was created by insufflation of CO2 gas.  Laparoscope was introduced and there is no evidence of underlying bleeding or organ injury. I inspected the surface the liver and no obvious lesions were noted. There are multiple peritoneal implants in involving the diaphragm and the right upper quadrant area as well as the right lateral abdominal wall. There were multiple  omental implants noted as well. There is a firm relatively fixed lesion noted in the right upper quadrant. A 5 mm trochars placed in the right lower quadrant. I was able to successfully manipulate this lesion or mobilize the colon. Thus, I decided to proceed with exploratory laparotomy.  Midline incision was made dividing all layers into the peritoneal cavity. Bookwalter retractor was used for exposure. Examining the mass appeared to be adherent to posterior structures with serosal involvement of the colon as well as omental involvement of that site. I mobilized the distal ileum by dividing attachments in the right lower quadrant area. I divided the distal ileum with the stapler. The right colon was mobilized by dividing lateral attachments. I then carefully began to try to mobilize the tumor free from the duodenum. Part of the tumor was adherent to the serosa of the duodenum and I mobilized it free from this. There is a small defect in the serosa and this was closed with interrupted 3-0 silk sutures. The tumor was adherent to the anterior aspect of the pancreas. I identified transverse colon distally that was not involved with the process. At the mid transverse colon area I divided the transverse colon with the stapler. I then used the LigaSure to mobilize and divide the omentum. I continued to mobilize the tumor off the pancreas gland and there is some bleeding from small vessels on the pancreas gland. I ran into area of significant venous bleeding appeared to be middle colic branches off the superior mesenteric vein. These were oversewn. I use a vascular clamp to occlude these and noted that the bile became slightly pale. I removed the vascular clamp and the bowel was pink and  viable looking. Direct pressure was held at the area and the rest of the bleeding stopped. I then continued to mobilize the tumor off the anterior pancreas and then divided the mesentery until I had freed up the specimen. I then handed  off the specimen which consisted of the distal ileum, right colon, and the proximal half of the transverse colon. Omentum was with this as well.  There appeared to be some tumor left behind on the anterior pancreas. There is no bleeding from this area.  All the bowel appeared pink and viable. I subsequently removed some of the distal ileum that appeared to be slightly thickened. A side-to-side anastomosis between the ileum and transverse colon was then performed with the linear cutting stapler. There is no bleeding from the staple line. The common defect was closed with a linear noncutting stapler. A crotch stitch was placed of 3-0 silk. The anastomosis was patent, viable, and under no tension.  There were tumor implants on the gallbladder. Thus a cholecystectomy was done. Beginning at the fundus side mobilize the gallbladder free from the liver. I then identified the cystic artery and created a window around it. It was divided. The cystic duct was identified and isolated. It was clipped and divided. The gallbladder was then handed off the field. A laparotomy pad was placed in the right upper quadrant and subsequently removed when hemostasis was noted to be adequate in the gallbladder fossa.  The abdominal cavity was then copiously irrigated. I biopsied some of the diaphragm implants in the right upper quadrant then repaired the defect with interrupted 0 Ethibond sutures.the 5 mm trocar from the right lower quadrant was removed and a drain was passed through the trocar site. It was placed anterior to the pancreas gland. Inspection of the abdominal cavity demonstrated no evidence of bleeding or organ injury. Laparotomy sponge counts were reported to be correct.    The midline fascia was then closed with running double looped #1 PDS. The subcutaneous tissue was irrigated and skin was closed with staples. The drain was hooked to bulb suction and anchored to the skin with nylon suture. Sterile dressings were  applied.  He tolerated the procedures well without apparent complications. He was taken the recovery room in satisfactory condition.  Findings:  There is a large bulky tumor adherent to the second portion of duodenum and anterior pancreas as well as the middle colic vein. There were peritoneal implants on the gallbladder in the right upper quadrant and right mid abdominal wall areas. There were multiple omental implants as well  Estimated Blood Loss:  400 ml         Drains: #19 round Blake drain lying anterior to pancreas  Blood Given: 2 units PRBCs          Specimens: right colon and terminal ileum, gallbladder, diaphragm peritoneal implant.        Complications:  * No complications entered in OR log *         Disposition: PACU - hemodynamically stable.         Condition: stable

## 2014-07-30 NOTE — Anesthesia Procedure Notes (Addendum)
Procedure Name: Intubation Date/Time: 07/30/2014 2:13 PM Performed by: Montel Clock Pre-anesthesia Checklist: Patient identified, Emergency Drugs available, Suction available, Patient being monitored and Timeout performed Patient Re-evaluated:Patient Re-evaluated prior to inductionOxygen Delivery Method: Circle system utilized Preoxygenation: Pre-oxygenation with 100% oxygen Intubation Type: IV induction and Rapid sequence Laryngoscope Size: Mac and 3 Grade View: Grade II Tube type: Oral Tube size: 7.5 mm Number of attempts: 1 Airway Equipment and Method: Stylet Placement Confirmation: ETT inserted through vocal cords under direct vision,  positive ETCO2 and breath sounds checked- equal and bilateral Secured at: 23 cm Tube secured with: Tape Dental Injury: Teeth and Oropharynx as per pre-operative assessment    The patient was identified and consent obtained.  TO was performed, and full barrier precautions were used.  The skin was anesthetized with lidocaine.  Once the vein was located with the 22 ga., the wire was inserted into the vein. The insertion site was dilated and the CVL was carefully inserted and sutured in place. The patient tolerated the procedure well.  CXR was ordered for PACU. Start: 3568 End: 6168 J. Tedra Senegal, MD

## 2014-07-30 NOTE — Progress Notes (Addendum)
Progress Note   Donald Fry EVO:[id number] DOB: 06/06/1955 DOA: 07/28/2014 PCP: [name], MD   Brief Narrative:   Donald Fry is an 59 y.o. male with a PMH of hyperlipidemia, anemia, hypertension and tobacco abuse who was admitted 07/28/14 with chief complaint of 4-5 day history of right upper quadrant pain. He also has had a several month history of dyspnea on exertion, unintentional weight loss. A CT scan of the abdomen and pelvis was done on admission which showed a possible colon cancer near the hepatic flexure with surrounding lymphadenopathy. He has not had a colonoscopy in the past. GI has been consulted.  Assessment/Plan:   Principal problem: Chronic lower GI bleed / chronic blood loss anemia secondary to bleeding colonic mass - Seems to be a more of an acute on chronic GI bleed with a CT finding probability for colon cancer and progressive symptoms.  - S/P 1 unit of PRBCs 07/28/14. - Being followed by Dr. Fuller Plan, s/p colonoscopy showing a large, circumferential, friable, firm, obstructing and ulcerated mass at the hepatic flexure. Multiple biopsies of the lesion were performed. - For surgical resection today.  Active problems:  Hypertension  - Continue atenolol.  Hyperlipidemia  - Continue home statin.   Anxiety  - Continue home medications.  Tobacco abuse  - Counseled for cessation; continue nicotine patch.  Hypokalemia  - Add potassium to IV fluids.  Leukocytosis  - Continue empiric Zosyn given rising WBC and possibility of microperforation from colonic mass.   DVT Prophylaxis -SCDs ordered.  Code Status: Full. Family Communication: Wife and sister updated at the bedside. Disposition Plan: Home when stable.   IV Access:    Peripheral IV   Procedures and diagnostic studies:   Ct Abdomen Pelvis W Contrast 07/28/14: 1. Markedly abnormal hepatic flexure with wall thickening along a 10-12 cm length but extensive surrounding mesenteric  nodularity, and increased regional lymph nodes. The appearance is atypical for acute colitis and highly suspicious for adenocarcinoma. GI consultation for followup colonoscopy recommended. 2. Small volume perihepatic fluid. Multiple up to 14 mm indeterminate low-density lesions in the liver. Several prominent but indeterminate retroperitoneal lymph nodes in the upper abdomen. 3. Abnormal fat stranding/nodularity continues to the gallbladder fossa, but the gallbladder does not appear inflamed.    Colonoscopy 07/29/14: 1. Large circumferential obstructing friable mass at the hepaticflexure; multiple biopsies performed2. Mild diverticulosis was noted in the sigmoid colon anddescending colon.  Medical Consultants:    Dr. Lucio Edward, GI.  Dr. Jackolyn Confer, Surgery  Anti-Infectives:    Zosyn 07/29/14--->  Subjective:   Donald Fry is sedated after being given anxiolytics pre-operatively.  He was quite anxious earlier.  Having abdominal pain, per family.  Objective:    Filed Vitals:   07/29/14 1419 07/29/14 1818 07/29/14 2205 07/30/14 0610  BP: 141/68 125/67 108/57 113/54  Pulse: 55 63 61 66  Temp: 97.8 F (36.6 C) 97.7 F (36.5 C) 99 F (37.2 C) 98.3 F (36.8 C)  TempSrc: Oral Oral Oral Oral  Resp: 16 16 16 16   Height:      Weight:      SpO2: 100% 100% 100% 97%    Intake/Output Summary (Last 24 hours) at 07/30/14 2993 Last data filed at 07/30/14 0800  Gross per 24 hour  Intake    660 ml  Output   1850 ml  Net  -1190 ml    Exam: Gen:  NAD Cardiovascular:  RRR, No M/R/G Respiratory:  Lungs CTAB Gastrointestinal:  Abdomen soft, tender, +BS Extremities:  No C/E/C   Data Reviewed:    Labs: Basic Metabolic Panel:  Recent Labs Lab 07/25/14 0850 07/28/14 0830 07/29/14 0555 07/30/14 0540  NA 137 136 137 139  K 2.9* 3.1* 4.0 3.4*  CL 100 100 100 103  CO2 30 31 29 29   GLUCOSE 91 103* 98 100*  BUN 10 17 19 14   CREATININE 0.73 0.70 0.68 0.68  CALCIUM  8.8 8.5 8.5 8.1*   GFR Estimated Creatinine Clearance: 98.2 mL/min (by C-G formula based on Cr of 0.68). Liver Function Tests:  Recent Labs Lab 07/25/14 0850 07/28/14 0830  AST 17 16  ALT 11 10  ALKPHOS 99 78  BILITOT 0.4 0.3  PROT 7.5 6.7  ALBUMIN 3.9 3.4*    Recent Labs Lab 07/28/14 0830  LIPASE 20   CBC:  Recent Labs Lab 07/25/14 0850 07/28/14 0755 07/28/14 0830 07/29/14 0555 07/30/14 0540  WBC 11.6* SPECIMEN CLOTTED 18.7* 15.5* 13.2*  NEUTROABS 8.4* PENDING 15.6*  --   --   HGB 8.2* SPECIMEN CLOTTED 7.9* 8.0* 7.7*  HCT 27.2* SPECIMEN CLOTTED 26.8* 27.5* 26.2*  MCV 77.5* SPECIMEN CLOTTED 78.4 79.7 78.9  PLT 483* SPECIMEN CLOTTED 526* 488* 427*   Cardiac Enzymes:  Recent Labs Lab 07/25/14 0850  TROPONINI <0.03    Microbiology No results found for this or any previous visit (from the past 240 hour(s)).   Medications:   . sodium chloride   Intravenous Once  . sodium chloride   Intravenous Once  . atenolol  100 mg Oral Daily  . ferrous sulfate  325 mg Oral Q breakfast  . nicotine  21 mg Transdermal Daily  . piperacillin-tazobactam (ZOSYN)  IV  3.375 g Intravenous 3 times per day  . pravastatin  40 mg Oral q1800   Continuous Infusions:   Time spent: 25 minutes.    LOS: 2 days   RAMA,CHRISTINA  [facility] Pager [phone]. If unable to reach me by pager, please call my cell phone at [phone].  *Please refer to [email or web address], password [email or web address] to get updated schedule on who will round on this patient, as hospitalists switch teams weekly. If 7PM-7AM, please contact night-coverage at [email or web address], password [email or web address] for any overnight needs.  07/30/2014, 8:22 AM

## 2014-07-30 NOTE — Anesthesia Postprocedure Evaluation (Signed)
Anesthesia Post Note  Patient: Donald Fry  Procedure(s) Performed: Procedure(s) (LRB): LAPAROSCOPY CONVERTED TO EXPLORATORY LAPAROTOMY EXTENDED RIGHT COLECTOMY BIOPSY OF DIAPHRAGMATIC IMPLANT CLOSURE OF DIAPHRAGM CHOLECYSTECTOMY (N/A) LAPAROSCOPIC CHOLECYSTECTOMY WITH INTRAOPERATIVE CHOLANGIOGRAM (N/A)  Anesthesia type: general  Patient location: PACU  Post pain: Pain level controlled  Post assessment: Patient's Cardiovascular Status Stable  Last Vitals:  Filed Vitals:   07/30/14 1904  BP: 133/70  Pulse:   Temp: 36.8 C  Resp:     Post vital signs: Reviewed and stable  Level of consciousness: sedated  Complications: No apparent anesthesia complications

## 2014-07-30 NOTE — Anesthesia Preprocedure Evaluation (Addendum)
Anesthesia Evaluation  Patient identified by MRN, date of birth, ID band Patient awake    Reviewed: Allergy & Precautions, NPO status , Patient's Chart, lab work & pertinent test results  History of Anesthesia Complications (+) history of anesthetic complications  Airway Mallampati: II  TM Distance: >3 FB Neck ROM: Full    Dental  (+) Poor Dentition, Missing, Dental Advisory Given   Pulmonary Current Smoker,    Pulmonary exam normal       Cardiovascular hypertension,     Neuro/Psych PSYCHIATRIC DISORDERS Anxiety negative neurological ROS     GI/Hepatic negative GI ROS, Neg liver ROS,   Endo/Other  negative endocrine ROS  Renal/GU negative Renal ROS     Musculoskeletal   Abdominal   Peds  Hematology   Anesthesia Other Findings   Reproductive/Obstetrics                            Anesthesia Physical Anesthesia Plan  ASA: III  Anesthesia Plan: General   Post-op Pain Management:    Induction: Intravenous and Rapid sequence  Airway Management Planned: Oral ETT  Additional Equipment:   Intra-op Plan:   Post-operative Plan: Possible Post-op intubation/ventilation  Informed Consent: I have reviewed the patients History and Physical, chart, labs and discussed the procedure including the risks, benefits and alternatives for the proposed anesthesia with the patient or authorized representative who has indicated his/her understanding and acceptance.   Dental advisory given  Plan Discussed with: CRNA, Anesthesiologist and Surgeon  Anesthesia Plan Comments:        Anesthesia Quick Evaluation

## 2014-07-30 NOTE — Progress Notes (Signed)
Pathology called with verbal report on colon mass biopsy: poorly differentiated adenocarcinoma.

## 2014-07-31 LAB — CBC
HEMATOCRIT: 28.7 % — AB (ref 39.0–52.0)
Hemoglobin: 8.9 g/dL — ABNORMAL LOW (ref 13.0–17.0)
MCH: 24.4 pg — ABNORMAL LOW (ref 26.0–34.0)
MCHC: 31 g/dL (ref 30.0–36.0)
MCV: 78.6 fL (ref 78.0–100.0)
PLATELETS: 395 10*3/uL (ref 150–400)
RBC: 3.65 MIL/uL — AB (ref 4.22–5.81)
RDW: 16.2 % — ABNORMAL HIGH (ref 11.5–15.5)
WBC: 13.2 10*3/uL — ABNORMAL HIGH (ref 4.0–10.5)

## 2014-07-31 LAB — BASIC METABOLIC PANEL
Anion gap: 4 — ABNORMAL LOW (ref 5–15)
BUN: 10 mg/dL (ref 6–23)
CHLORIDE: 109 mmol/L (ref 96–112)
CO2: 27 mmol/L (ref 19–32)
CREATININE: 0.71 mg/dL (ref 0.50–1.35)
Calcium: 7.4 mg/dL — ABNORMAL LOW (ref 8.4–10.5)
GFR calc Af Amer: >90 mL/min (ref >90)
GLUCOSE: 154 mg/dL — AB (ref 70–99)
POTASSIUM: 3.8 mmol/L (ref 3.5–5.1)
Sodium: 140 mmol/L (ref 135–145)

## 2014-07-31 MED ORDER — LORAZEPAM 2 MG/ML IJ SOLN
0.5000 mg | Freq: Four times a day (QID) | INTRAMUSCULAR | Status: DC | PRN
  Administered 2014-07-31 – 2014-08-05 (×18): 0.5 mg via INTRAVENOUS
  Filled 2014-07-31 (×18): qty 1

## 2014-07-31 NOTE — Progress Notes (Signed)
Patient ID: Donald Fry, male   DOB: 1956-02-03, 59 y.o.   MRN: [id number] 1 Day Post-Op  Subjective: Having incisional pain this morning as he fell asleep and did not use PCA. No other complaints.  Objective: Vital signs in last 24 hours: Temp:  [98 F (36.7 C)-99.2 F (37.3 C)] 98 F (36.7 C) (02/27 0400) Pulse Rate:  [75-93] 79 (02/27 0700) Resp:  [12-22] 20 (02/27 0700) BP: (118-151)/(55-93) 138/57 mmHg (02/27 0700) SpO2:  [93 %-100 %] 97 % (02/27 0700) Arterial Line BP: (141-153)/(63-79) 141/63 mmHg (02/26 1900) Weight:  [167 lb 12.3 oz (76.1 kg)] 167 lb 12.3 oz (76.1 kg) (02/27 0400) Last BM Date: 07/29/14  Intake/Output from previous day: 02/26 0701 - 02/27 0700 In: 5895 [I.V.:5175; Blood:670; IV Piggyback:50] Out: 2525 [Urine:1720; Emesis/NG output:5; Drains:400; Blood:400] Intake/Output this shift:    General appearance: alert, cooperative and mild distress Resp: clear to auscultation bilaterally and no increased work of breathing GI: mild appropriate tenderness. Nondistended. JP drain has what appears to be bilious drainage. Incision/Wound: dressing clean and dry  Lab Results:   Recent Labs  07/30/14 0540 07/30/14 1652 07/31/14 0535  WBC 13.2*  --  13.2*  HGB 7.7* 9.2* 8.9*  HCT 26.2* 27.0* 28.7*  PLT 427*  --  395   BMET  Recent Labs  07/30/14 0540 07/30/14 1652 07/31/14 0535  NA 139 140 140  K 3.4* 3.7 3.8  CL 103  --  109  CO2 29  --  27  GLUCOSE 100* 150* 154*  BUN 14  --  10  CREATININE 0.68  --  0.71  CALCIUM 8.1*  --  7.4*     Studies/Results: Dg Chest Port 1 View  07/30/2014   CLINICAL DATA:  Evaluate PICC line placement  EXAM: PORTABLE CHEST - 1 VIEW  COMPARISON:  07/25/2014  FINDINGS: Low lung volumes. Mild right basilar atelectasis. No focal consolidation. No pleural effusion or pneumothorax.  The heart is normal in size.  Right IJ venous catheter terminates in the mid SVC.  Enteric tube courses into the proximal stomach.   Cholecystectomy clips.  IMPRESSION: Right IJ venous catheter terminates in the mid SVC. No pneumothorax.   Electronically Signed   By: Julian Hy M.D.   On: 07/30/2014 18:34    Anti-infectives: Anti-infectives    Start     Dose/Rate Route Frequency Ordered Stop   07/31/14 0200  cefoTEtan (CEFOTAN) 2 g in dextrose 5 % 50 mL IVPB     2 g 100 mL/hr over 30 Minutes Intravenous Every 12 hours 07/30/14 2109 07/31/14 0326   07/29/14 2200  piperacillin-tazobactam (ZOSYN) IVPB 3.375 g  Status:  Discontinued     3.375 g 12.5 mL/hr over 240 Minutes Intravenous 3 times per day 07/29/14 1328 07/30/14 2109   07/29/14 1500  piperacillin-tazobactam (ZOSYN) IVPB 3.375 g     3.375 g 100 mL/hr over 30 Minutes Intravenous  Once 07/29/14 1328 07/29/14 1549      Assessment/Plan: s/p Procedure(s): LAPAROSCOPY CONVERTED TO EXPLORATORY LAPAROTOMY EXTENDED RIGHT COLECTOMY BIOPSY OF DIAPHRAGMATIC IMPLANT CLOSURE OF DIAPHRAGM CHOLECYSTECTOMY LAPAROSCOPIC CHOLECYSTECTOMY WITH INTRAOPERATIVE CHOLANGIOGRAM Appears stable postoperatively. JP drainage may be bilious. Monitor for now. Pulmonary toilet encouraged. Continue NG.   LOS: 3 days    Donald Fry 07/31/2014

## 2014-07-31 NOTE — Progress Notes (Signed)
Progress Note   JAMEEL QUANT BEE:[id number] DOB: 1955/07/07 DOA: 07/28/2014 PCP: [name], MD   Brief Narrative:   Donald Fry is an 59 y.o. male with a PMH of hyperlipidemia, anemia, hypertension and tobacco abuse who was admitted 07/28/14 with chief complaint of 4-5 day history of right upper quadrant pain. He also has had a several month history of dyspnea on exertion, unintentional weight loss. A CT scan of the abdomen and pelvis was done on admission which showed a possible colon cancer near the hepatic flexure with surrounding lymphadenopathy. He has not had a colonoscopy in the past. Underwent colonoscopy 07/29/14 followed by surgical resection of colon cancer 07/30/14.  Assessment/Plan:   Principal problem: Chronic lower GI bleed / chronic blood loss anemia secondary to bleeding colonic mass - Chronic lower GI bleeding related to large colon mass, confirmed by colonoscopy done 07/29/14.  - S/P 1 unit of PRBCs 07/28/14 and 2 units of PRBCs intraoperatively on 07/30/14. Hemoglobin stable at 8.9 mg/dL. - Status post exploratory laparotomy, extended right colectomy, biopsy of peritoneal nodules and cholecystectomy on 07/30/14. 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 Will need oncology consultation.  Active problems:  Hypertension  - Continue atenolol.  Hyperlipidemia  - Continue home statin.   Anxiety  - Continue home medications.  Tobacco abuse  - Counseled for cessation; continue nicotine patch.  Hypokalemia  - Repleted.  Leukocytosis  - Treated with empiric Zosyn given rising WBC and possibility of microperforation from colonic mass. - Antibiotics discontinued post surgery.   DVT Prophylaxis -SCDs ordered.  Code Status: Full. Family Communication: Wife and sister updated at the bedside 07/30/14. Disposition Plan: Home when stable.   IV Access:    Peripheral IV   Procedures and diagnostic studies:     Ct Abdomen Pelvis W Contrast 07/28/14: 1. Markedly abnormal hepatic flexure with wall thickening along a 10-12 cm length but extensive surrounding mesenteric nodularity, and increased regional lymph nodes. The appearance is atypical for acute colitis and highly suspicious for adenocarcinoma. GI consultation for followup colonoscopy recommended. 2. Small volume perihepatic fluid. Multiple up to 14 mm indeterminate low-density lesions in the liver. Several prominent but indeterminate retroperitoneal lymph nodes in the upper abdomen. 3. Abnormal fat stranding/nodularity continues to the gallbladder fossa, but the gallbladder does not appear inflamed.    Colonoscopy 07/29/14: 1. Large circumferential obstructing friable mass at the hepaticflexure; multiple biopsies performed2. Mild diverticulosis was noted in the sigmoid colon anddescending colon.  Laparoscopy converted to exploratory laparotomy, extended right colectomy, biopsy of peritoneal nodules on diaphragm with closure of diaphragm, cholecystectomy 07/30/14: Done by Dr. Zella Richer.  Medical Consultants:    Dr. Lucio Edward, GI.  Dr. Jackolyn Confer, Surgery  Anti-Infectives:    Zosyn 07/29/14---> 07/30/14  Subjective:   Nicholos Johns is complaining of a good bit of abdominal discomfort.  No flatus.  No nausea/has NG tube in.  Mild dyspnea, no cough.  Objective:    Filed Vitals:   07/31/14 0400 07/31/14 0500 07/31/14 0600 07/31/14 0640  BP: 134/55 131/64 131/62   Pulse: 87 93 84   Temp: 98 F (36.7 C)     TempSrc: Oral     Resp: 19 19 20 20   Height:      Weight: 76.1 kg (167 lb 12.3 oz)     SpO2: 95% 95% 97% 97%    Intake/Output Summary (Last 24 hours) at 07/31/14 0729 Last data filed at 07/31/14 0600  Gross per 24 hour  Intake   5895 ml  Output   2525 ml  Net   3370 ml    Exam: Gen:  NAD, NG tube in Cardiovascular:  RRR, No M/R/G Respiratory:  Lungs CTAB Gastrointestinal:  Abdomen soft, tender,  quiet Extremities:  No C/E/C   Data Reviewed:    Labs: Basic Metabolic Panel:  Recent Labs Lab 07/25/14 0850 07/28/14 0830 07/29/14 0555 07/30/14 0540 07/30/14 1652 07/31/14 0535  NA 137 136 137 139 140 140  K 2.9* 3.1* 4.0 3.4* 3.7 3.8  CL 100 100 100 103  --  109  CO2 30 31 29 29   --  27  GLUCOSE 91 103* 98 100* 150* 154*  BUN 10 17 19 14   --  10  CREATININE 0.73 0.70 0.68 0.68  --  0.71  CALCIUM 8.8 8.5 8.5 8.1*  --  7.4*   GFR Estimated Creatinine Clearance: 98.2 mL/min (by C-G formula based on Cr of 0.71). Liver Function Tests:  Recent Labs Lab 07/25/14 0850 07/28/14 0830  AST 17 16  ALT 11 10  ALKPHOS 99 78  BILITOT 0.4 0.3  PROT 7.5 6.7  ALBUMIN 3.9 3.4*    Recent Labs Lab 07/28/14 0830  LIPASE 20   CBC:  Recent Labs Lab 07/25/14 0850 07/28/14 0755 07/28/14 0830 07/29/14 0555 07/30/14 0540 07/30/14 1652 07/31/14 0535  WBC 11.6* SPECIMEN CLOTTED 18.7* 15.5* 13.2*  --  13.2*  NEUTROABS 8.4* PENDING 15.6*  --   --   --   --   HGB 8.2* SPECIMEN CLOTTED 7.9* 8.0* 7.7* 9.2* 8.9*  HCT 27.2* SPECIMEN CLOTTED 26.8* 27.5* 26.2* 27.0* 28.7*  MCV 77.5* SPECIMEN CLOTTED 78.4 79.7 78.9  --  78.6  PLT 483* SPECIMEN CLOTTED 526* 488* 427*  --  395   Cardiac Enzymes:  Recent Labs Lab 07/25/14 0850  TROPONINI <0.03    Microbiology No results found for this or any previous visit (from the past 240 hour(s)).   Medications:   . morphine   Intravenous 6 times per day  . pantoprazole (PROTONIX) IV  40 mg Intravenous Q24H   Continuous Infusions: . dextrose 5 % and 0.9 % NaCl with KCl 20 mEq/L 125 mL/hr at 07/31/14 0600    Time spent: 35 minutes.  The patient is medically complex and requires high complexity decision making. .    LOS: 3 days   [place] [facility] Pager [phone]. If unable to reach me by pager, please call my cell phone at [phone].  *Please refer to [email or web address], password [email or web address] to get updated schedule on who  will round on this patient, as hospitalists switch teams weekly. If 7PM-7AM, please contact night-coverage at [email or web address], password [email or web address] for any overnight needs.  07/31/2014, 7:29 AM

## 2014-08-01 ENCOUNTER — Inpatient Hospital Stay (HOSPITAL_COMMUNITY): Payer: Medicaid Other

## 2014-08-01 DIAGNOSIS — J9601 Acute respiratory failure with hypoxia: Secondary | ICD-10-CM

## 2014-08-01 LAB — COMPREHENSIVE METABOLIC PANEL
ALK PHOS: 58 U/L (ref 39–117)
ALT: 23 U/L (ref 0–53)
AST: 25 U/L (ref 0–37)
Albumin: 2.2 g/dL — ABNORMAL LOW (ref 3.5–5.2)
Anion gap: 4 — ABNORMAL LOW (ref 5–15)
BUN: 9 mg/dL (ref 6–23)
CO2: 26 mmol/L (ref 19–32)
CREATININE: 0.53 mg/dL (ref 0.50–1.35)
Calcium: 7.2 mg/dL — ABNORMAL LOW (ref 8.4–10.5)
Chloride: 108 mmol/L (ref 96–112)
GFR calc Af Amer: >90 mL/min (ref >90)
GFR calc non Af Amer: >90 mL/min (ref >90)
Glucose, Bld: 138 mg/dL — ABNORMAL HIGH (ref 70–99)
POTASSIUM: 4 mmol/L (ref 3.5–5.1)
Sodium: 138 mmol/L (ref 135–145)
Total Bilirubin: 0.6 mg/dL (ref 0.3–1.2)
Total Protein: 5.2 g/dL — ABNORMAL LOW (ref 6.0–8.3)

## 2014-08-01 LAB — TYPE AND SCREEN
ABO/RH(D): A POS
Antibody Screen: NEGATIVE
UNIT DIVISION: 0
UNIT DIVISION: 0
UNIT DIVISION: 0
UNIT DIVISION: 0
Unit division: 0
Unit division: 0
Unit division: 0

## 2014-08-01 LAB — CBC
HCT: 26.8 % — ABNORMAL LOW (ref 39.0–52.0)
HEMOGLOBIN: 8.1 g/dL — AB (ref 13.0–17.0)
MCH: 24.6 pg — ABNORMAL LOW (ref 26.0–34.0)
MCHC: 30.2 g/dL (ref 30.0–36.0)
MCV: 81.5 fL (ref 78.0–100.0)
Platelets: 359 10*3/uL (ref 150–400)
RBC: 3.29 MIL/uL — ABNORMAL LOW (ref 4.22–5.81)
RDW: 16.8 % — ABNORMAL HIGH (ref 11.5–15.5)
WBC: 21 10*3/uL — AB (ref 4.0–10.5)

## 2014-08-01 LAB — LACTIC ACID, PLASMA: LACTIC ACID, VENOUS: 1.1 mmol/L (ref 0.5–2.0)

## 2014-08-01 MED ORDER — PIPERACILLIN-TAZOBACTAM 3.375 G IVPB
3.3750 g | Freq: Three times a day (TID) | INTRAVENOUS | Status: DC
  Administered 2014-08-01 – 2014-08-08 (×21): 3.375 g via INTRAVENOUS
  Filled 2014-08-01 (×21): qty 50

## 2014-08-01 MED ORDER — PIPERACILLIN-TAZOBACTAM 3.375 G IVPB 30 MIN
3.3750 g | Freq: Once | INTRAVENOUS | Status: AC
  Administered 2014-08-01: 3.375 g via INTRAVENOUS
  Filled 2014-08-01: qty 50

## 2014-08-01 NOTE — Progress Notes (Signed)
ANTIBIOTIC CONSULT NOTE - INITIAL  Pharmacy Consult for Zosyn Indication: intra-abdominal infection vs pneumonia  No Known Allergies  Patient Measurements: Height: 5\' 9"  (175.3 cm) Weight: 167 lb 12.3 oz (76.1 kg) IBW/kg (Calculated) : 70.7  Vital Signs: Temp: 98.3 F (36.8 C) (02/28 1200) Temp Source: Oral (02/28 1200) BP: 153/79 mmHg (02/28 1200) Pulse Rate: 96 (02/28 1200) Intake/Output from previous day: 02/27 0701 - 02/28 0700 In: 3072 [I.V.:3012; NG/GT:60] Out: 2055 [Urine:1460; Emesis/NG output:100; Drains:495] Intake/Output from this shift: Total I/O In: -  Out: 325 [Urine:275; Drains:50]  Labs:  Recent Labs  07/30/14 0540 07/30/14 1652 07/31/14 0535 08/01/14 0545  WBC 13.2*  --  13.2* 21.0*  HGB 7.7* 9.2* 8.9* 8.1*  PLT 427*  --  395 359  CREATININE 0.68  --  0.71 0.53   Estimated Creatinine Clearance: 98.2 mL/min (by C-G formula based on Cr of 0.53).  Medical History: Past Medical History  Diagnosis Date  . Hypertension   . High cholesterol   . Anxiety   . Colon cancer 07/30/2014    Medications:  Scheduled:  . morphine   Intravenous 6 times per day  . pantoprazole (PROTONIX) IV  40 mg Intravenous Q24H  . piperacillin-tazobactam (ZOSYN)  IV  3.375 g Intravenous Q8H   Infusions:  . dextrose 5 % and 0.9 % NaCl with KCl 20 mEq/L 125 mL/hr at 08/01/14 2979   Assessment: 76 yoF admitted 2/24 for evaluation of persistent R flank pain and anemia. Pt was seen in ED 2/21 for bronchitis and RLQ pain, found to have cholelithiasis without evidence of cholecystitis. Colonoscopy performed 2/25 revealed large circumferential obstructing bleeding friable mass at the hepatic flexure. Pt is s/p exploratory laparotomy, extended right colectomy, biopsy of peritoneal nodules and cholecystectomy on 07/30/14.   Pt was originally started on Zosyn per Pharmacy on 2/25 for intra-abdominal infection, which was discontinued post-surgery on 2/26. Pt now with new leukocytosis,  bilateral opacities on chest XRay and Pharmacy is asked to restart Zosyn for pneumonia vs intra-abdominal infection.  Antiinfectives  2/27 >> cefotetan x1 2/26 >> Zosyn >> 2/26 2/28 >> Zosyn restart >>  Labs / vitals Tmax: afebrile WBCs: 21.0, increased Renal: SCr 0.53 (baseline unknown), CrCl 98 ml/min CG, 100N  No microbiologic data available   Goal of Therapy:  Zosyn per indication and renal function  Plan:  - start Zosyn 3.375G IV q8h, each dose to be infused over 4 hours - follow-up clinical course, culture results, renal function - follow-up antibiotic de-escalation and length of therapy  Thank you for the consult.  Currie Paris, PharmD, BCPS Pager: [phone] Pharmacy: [phone] 08/01/2014 1:47 PM

## 2014-08-01 NOTE — Progress Notes (Addendum)
Progress Note   Donald Fry UYQ:[id number] DOB: April 16, 1956 DOA: 07/28/2014 PCP: [name], MD   Brief Narrative:   Donald Fry is an 59 y.o. male with a PMH of hyperlipidemia, anemia, hypertension and tobacco abuse who was admitted 07/28/14 with chief complaint of 4-5 day history of right upper quadrant pain. He also has had a several month history of dyspnea on exertion, unintentional weight loss. A CT scan of the abdomen and pelvis was done on admission which showed a possible colon cancer near the hepatic flexure with surrounding lymphadenopathy. He has not had a colonoscopy in the past. Underwent colonoscopy 07/29/14 followed by surgical resection of colon cancer 07/30/14.  Assessment/Plan:   Principal problem: Chronic lower GI bleed / chronic blood loss anemia secondary to bleeding colonic mass - Chronic lower GI bleeding related to large colon mass, confirmed by colonoscopy done 07/29/14.  - S/P 1 unit of PRBCs 07/28/14 and 2 units of PRBCs intraoperatively on 07/30/14. Hemoglobin stable at 8.9 mg/dL. - Status post exploratory laparotomy, extended right colectomy, biopsy of peritoneal nodules and cholecystectomy on 07/30/14. 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 Will need oncology consultation.  Active problems:  Hypertension  - Continue atenolol.  Hyperlipidemia  - Continue home statin.   Anxiety  - Continue home medications.  Tobacco abuse  - Counseled for cessation; continue nicotine patch.  Hypokalemia  - Repleted.  Leukocytosis /Acute respiratory failure with hypoxia / Probable HCAP  - Treated with empiric Zosyn given rising WBC and possibility of microperforation from colonic mass. - Antibiotics discontinued post surgery. Significant jump in WBC noted this morning. Will resume Zosyn. - CXR shows possible PNA versus fluid.  On empiric Zosyn. - HIDA scan ordered by surgery to assess for bile leak. -  Continue supplemental oxygen.   DVT Prophylaxis -SCDs ordered.  Code Status: Full. Family Communication: Wife and other family updated at bedside. Disposition Plan: Home when stable.   IV Access:    Peripheral IV   Procedures and diagnostic studies:   Ct Abdomen Pelvis W Contrast 07/28/14: 1. Markedly abnormal hepatic flexure with wall thickening along a 10-12 cm length but extensive surrounding mesenteric nodularity, and increased regional lymph nodes. The appearance is atypical for acute colitis and highly suspicious for adenocarcinoma. GI consultation for followup colonoscopy recommended. 2. Small volume perihepatic fluid. Multiple up to 14 mm indeterminate low-density lesions in the liver. Several prominent but indeterminate retroperitoneal lymph nodes in the upper abdomen. 3. Abnormal fat stranding/nodularity continues to the gallbladder fossa, but the gallbladder does not appear inflamed.    Colonoscopy 07/29/14: 1. Large circumferential obstructing friable mass at the hepaticflexure; multiple biopsies performed2. Mild diverticulosis was noted in the sigmoid colon anddescending colon.  Laparoscopy converted to exploratory laparotomy, extended right colectomy, biopsy of peritoneal nodules on diaphragm with closure of diaphragm, cholecystectomy 07/30/14: Done by Dr. Zella Richer.  Dg Chest Port 1 View 08/01/2014: Increased bilateral upper lobe and right middle lobe opacities are noted most consistent with pneumonia or possibly edema. Followup radiographs are recommended.    Medical Consultants:    Dr. Lucio Edward, GI.  Dr. Jackolyn Confer, Surgery  Anti-Infectives:    Zosyn 07/29/14---> 07/30/14, resumed 08/01/14  Subjective:   Donald Fry is having less abdominal discomfort, but is now on a NRM for low oxygen saturation and has had some cough.  He is beginning to pass some flatus.  Objective:    Filed Vitals:   08/01/14 0500  08/01/14 0600 08/01/14 0700 08/01/14 0801   BP: 148/59 143/56 148/59   Pulse: 92 92 91   Temp:      TempSrc:      Resp: 19 23 21 21   Height:      Weight:      SpO2: 92% 89% 90% 93%    Intake/Output Summary (Last 24 hours) at 08/01/14 0806 Last data filed at 08/01/14 0600  Gross per 24 hour  Intake   2792 ml  Output   1930 ml  Net    862 ml    Exam: Gen:  NAD, NG tube in Cardiovascular:  RRR, No M/R/G Respiratory:  Lungs diminished right with scattered rales Gastrointestinal:  Abdomen soft, tender, quiet Extremities:  No C/E/C   Data Reviewed:    Labs: Basic Metabolic Panel:  Recent Labs Lab 07/28/14 0830 07/29/14 0555 07/30/14 0540 07/30/14 1652 07/31/14 0535 08/01/14 0545  NA 136 137 139 140 140 138  K 3.1* 4.0 3.4* 3.7 3.8 4.0  CL 100 100 103  --  109 108  CO2 31 29 29   --  27 26  GLUCOSE 103* 98 100* 150* 154* 138*  BUN 17 19 14   --  10 9  CREATININE 0.70 0.68 0.68  --  0.71 0.53  CALCIUM 8.5 8.5 8.1*  --  7.4* 7.2*   GFR Estimated Creatinine Clearance: 98.2 mL/min (by C-G formula based on Cr of 0.53). Liver Function Tests:  Recent Labs Lab 07/25/14 0850 07/28/14 0830 08/01/14 0545  AST 17 16 25   ALT 11 10 23   ALKPHOS 99 78 58  BILITOT 0.4 0.3 0.6  PROT 7.5 6.7 5.2*  ALBUMIN 3.9 3.4* 2.2*    Recent Labs Lab 07/28/14 0830  LIPASE 20   CBC:  Recent Labs Lab 07/25/14 0850 07/28/14 0755 07/28/14 0830 07/29/14 0555 07/30/14 0540 07/30/14 1652 07/31/14 0535 08/01/14 0545  WBC 11.6* SPECIMEN CLOTTED 18.7* 15.5* 13.2*  --  13.2* 21.0*  NEUTROABS 8.4* PENDING 15.6*  --   --   --   --   --   HGB 8.2* SPECIMEN CLOTTED 7.9* 8.0* 7.7* 9.2* 8.9* 8.1*  HCT 27.2* SPECIMEN CLOTTED 26.8* 27.5* 26.2* 27.0* 28.7* 26.8*  MCV 77.5* SPECIMEN CLOTTED 78.4 79.7 78.9  --  78.6 81.5  PLT 483* SPECIMEN CLOTTED 526* 488* 427*  --  395 359   Cardiac Enzymes:  Recent Labs Lab 07/25/14 0850  TROPONINI <0.03    Microbiology No results found for this or any previous visit (from the past 240  hour(s)).   Medications:   . morphine   Intravenous 6 times per day  . pantoprazole (PROTONIX) IV  40 mg Intravenous Q24H   Continuous Infusions: . dextrose 5 % and 0.9 % NaCl with KCl 20 mEq/L 125 mL/hr at 08/01/14 0734    Time spent: 35 minutes.  The patient is medically complex and requires high complexity decision making.    LOS: 4 days   [place] [facility] Pager [phone]. If unable to reach me by pager, please call my cell phone at [phone].  *Please refer to [email or web address], password [email or web address] to get updated schedule on who will round on this patient, as hospitalists switch teams weekly. If 7PM-7AM, please contact night-coverage at [email or web address], password [email or web address] for any overnight needs.  08/01/2014, 8:06 AM

## 2014-08-01 NOTE — Progress Notes (Signed)
Patient ID: Donald Fry, male   DOB: 1955/11/18, 59 y.o.   MRN: [id number] 2 Days Post-Op  Subjective: Patient feels a little better today, less pain. He is requiring a little more oxygen to maintain saturations. Has some productive cough.  Objective: Vital signs in last 24 hours: Temp:  [98.5 F (36.9 C)-99.8 F (37.7 C)] 99.8 F (37.7 C) (02/28 0400) Pulse Rate:  [81-94] 91 (02/28 0700) Resp:  [12-28] 21 (02/28 0801) BP: (124-160)/(54-73) 148/59 mmHg (02/28 0700) SpO2:  [89 %-98 %] 93 % (02/28 0801) Last BM Date: 07/29/14  Intake/Output from previous day: 02/27 0701 - 02/28 0700 In: 3072 [I.V.:3012; NG/GT:60] Out: 2055 [Urine:1460; Emesis/NG output:100; Drains:495] Intake/Output this shift: Total I/O In: -  Out: 325 [Urine:275; Drains:50]  General appearance: alert, cooperative and no distress Resp: some decreased breath sounds at the bases and  some upper airway congestion Abdomen: Soft and nondistended. Minimal expected tenderness which seems improved from yesterday. Right upper quadrant drain now has clear bilious drainage Incision/Wound: dressing clean and dry  Lab Results:   Recent Labs  07/31/14 0535 08/01/14 0545  WBC 13.2* 21.0*  HGB 8.9* 8.1*  HCT 28.7* 26.8*  PLT 395 359   BMET  Recent Labs  07/31/14 0535 08/01/14 0545  NA 140 138  K 3.8 4.0  CL 109 108  CO2 27 26  GLUCOSE 154* 138*  BUN 10 9  CREATININE 0.71 0.53  CALCIUM 7.4* 7.2*     Studies/Results: Dg Chest Port 1 View  07/30/2014   CLINICAL DATA:  Evaluate PICC line placement  EXAM: PORTABLE CHEST - 1 VIEW  COMPARISON:  07/25/2014  FINDINGS: Low lung volumes. Mild right basilar atelectasis. No focal consolidation. No pleural effusion or pneumothorax.  The heart is normal in size.  Right IJ venous catheter terminates in the mid SVC.  Enteric tube courses into the proximal stomach.  Cholecystectomy clips.  IMPRESSION: Right IJ venous catheter terminates in the mid SVC. No pneumothorax.    Electronically Signed   By: Julian Hy M.D.   On: 07/30/2014 18:34    Anti-infectives: Anti-infectives    Start     Dose/Rate Route Frequency Ordered Stop   08/01/14 1400  piperacillin-tazobactam (ZOSYN) IVPB 3.375 g     3.375 g 12.5 mL/hr over 240 Minutes Intravenous Every 8 hours 08/01/14 0811     08/01/14 0900  piperacillin-tazobactam (ZOSYN) IVPB 3.375 g     3.375 g 100 mL/hr over 30 Minutes Intravenous  Once 08/01/14 0811     07/31/14 0200  cefoTEtan (CEFOTAN) 2 g in dextrose 5 % 50 mL IVPB     2 g 100 mL/hr over 30 Minutes Intravenous Every 12 hours 07/30/14 2109 07/31/14 0326   07/29/14 2200  piperacillin-tazobactam (ZOSYN) IVPB 3.375 g  Status:  Discontinued     3.375 g 12.5 mL/hr over 240 Minutes Intravenous 3 times per day 07/29/14 1328 07/30/14 2109   07/29/14 1500  piperacillin-tazobactam (ZOSYN) IVPB 3.375 g     3.375 g 100 mL/hr over 30 Minutes Intravenous  Once 07/29/14 1328 07/29/14 1549      Assessment/Plan: s/p Procedure(s): LAPAROSCOPY CONVERTED TO EXPLORATORY LAPAROTOMY EXTENDED RIGHT COLECTOMY BIOPSY OF DIAPHRAGMATIC IMPLANT CLOSURE OF DIAPHRAGM, CHOLECYSTECTOMY Has bile in right upper quadrant drain. Abdomen seems benign and less tender although he does have leukocytosis. I'm going to order a hydatid to characterize his bile leak. Continue Zosyn for now. Out of bed today. Pulmonary toilet encouraged. Check chest x-ray.     LOS:  4 days    Briany Aye T 08/01/2014

## 2014-08-02 ENCOUNTER — Inpatient Hospital Stay (HOSPITAL_COMMUNITY): Payer: Medicaid Other

## 2014-08-02 ENCOUNTER — Encounter (HOSPITAL_COMMUNITY): Payer: Self-pay | Admitting: General Surgery

## 2014-08-02 DIAGNOSIS — J9801 Acute bronchospasm: Secondary | ICD-10-CM

## 2014-08-02 DIAGNOSIS — C184 Malignant neoplasm of transverse colon: Principal | ICD-10-CM

## 2014-08-02 LAB — COMPREHENSIVE METABOLIC PANEL
ALBUMIN: 2.1 g/dL — AB (ref 3.5–5.2)
ALT: 17 U/L (ref 0–53)
ANION GAP: 5 (ref 5–15)
AST: 21 U/L (ref 0–37)
Alkaline Phosphatase: 60 U/L (ref 39–117)
BILIRUBIN TOTAL: 0.7 mg/dL (ref 0.3–1.2)
BUN: 11 mg/dL (ref 6–23)
CHLORIDE: 111 mmol/L (ref 96–112)
CO2: 27 mmol/L (ref 19–32)
Calcium: 7.4 mg/dL — ABNORMAL LOW (ref 8.4–10.5)
Creatinine, Ser: 0.55 mg/dL (ref 0.50–1.35)
GFR calc non Af Amer: >90 mL/min (ref >90)
Glucose, Bld: 126 mg/dL — ABNORMAL HIGH (ref 70–99)
Potassium: 3.9 mmol/L (ref 3.5–5.1)
Sodium: 143 mmol/L (ref 135–145)
Total Protein: 5.2 g/dL — ABNORMAL LOW (ref 6.0–8.3)

## 2014-08-02 LAB — CBC
HCT: 23.8 % — ABNORMAL LOW (ref 39.0–52.0)
HEMOGLOBIN: 7.1 g/dL — AB (ref 13.0–17.0)
MCH: 24.4 pg — ABNORMAL LOW (ref 26.0–34.0)
MCHC: 29.8 g/dL — ABNORMAL LOW (ref 30.0–36.0)
MCV: 81.8 fL (ref 78.0–100.0)
Platelets: 326 10*3/uL (ref 150–400)
RBC: 2.91 MIL/uL — AB (ref 4.22–5.81)
RDW: 17.2 % — AB (ref 11.5–15.5)
WBC: 14.1 10*3/uL — ABNORMAL HIGH (ref 4.0–10.5)

## 2014-08-02 LAB — PREPARE RBC (CROSSMATCH)

## 2014-08-02 MED ORDER — SODIUM CHLORIDE 0.9 % IV SOLN
Freq: Once | INTRAVENOUS | Status: AC
Start: 2014-08-02 — End: 2014-08-02
  Administered 2014-08-02: 15:00:00 via INTRAVENOUS

## 2014-08-02 MED ORDER — TECHNETIUM TC 99M MEBROFENIN IV KIT
5.5000 | PACK | Freq: Once | INTRAVENOUS | Status: AC | PRN
  Administered 2014-08-02: 6 via INTRAVENOUS

## 2014-08-02 MED ORDER — ALBUTEROL SULFATE (2.5 MG/3ML) 0.083% IN NEBU
2.5000 mg | INHALATION_SOLUTION | Freq: Four times a day (QID) | RESPIRATORY_TRACT | Status: DC
  Administered 2014-08-02 – 2014-08-05 (×14): 2.5 mg via RESPIRATORY_TRACT
  Filled 2014-08-02 (×14): qty 3

## 2014-08-02 NOTE — Progress Notes (Signed)
Temp pre transfusion 99.3, after 15 mins temp 100.7. Pt states he feels fine, no other indications for a reaction noted. Dr Rama notified. Order received to continue transfusion.

## 2014-08-02 NOTE — Progress Notes (Signed)
3 Days Post-Op  Subjective: HIDA ordered for today.  Drain is clearly bilious.  He is wheezing a good deal, hurts to cough.  His wife is concerned her is not getting enough Ativan.  He is on high dose Xanax, chronically at home.  Abdominal wound looks fine  Objective: Vital signs in last 24 hours: Temp:  [98.2 F (36.8 C)-99.4 F (37.4 C)] 98.2 F (36.8 C) (02/29 0825) Pulse Rate:  [77-96] 77 (02/29 0600) Resp:  [19-27] 19 (02/29 0600) BP: (124-167)/(53-79) 137/56 mmHg (02/29 0600) SpO2:  [92 %-97 %] 95 % (02/29 0600) FiO2 (%):  [50 %] 50 % (02/28 0900) Weight:  [169 lb 1.5 oz (76.7 kg)] 169 lb 1.5 oz (76.7 kg) (02/29 0400) Last BM Date: 07/29/14 400 from NG recorded yesterday ,  Drain:  90 ml recorded. NPO Afebrile, VSS WBC improving, H/H down to 7.1/23.8 Pneumonia/ edema on film yesterday; Increased bilateral upper lobe and right middle lobe opacities are noted most consistent with pneumonia or possibly edema.   Intake/Output from previous day: 02/28 0701 - 02/29 0700 In: 2445 [I.V.:2260; NG/GT:30; IV Piggyback:50] Out: 2255 [Urine:1765; Emesis/NG output:400; Drains:90] Intake/Output this shift:    General appearance: alert, cooperative, no distress and anxious, hurts to cough. Resp: wheezes bilaterally and rales at bases. GI: not distended, tender.  Dessing removed and wound looks good, Jp full of clear bilious fluid. No bowel sounds. Lab Results:   Recent Labs  08/01/14 0545 08/02/14 0500  WBC 21.0* 14.1*  HGB 8.1* 7.1*  HCT 26.8* 23.8*  PLT 359 326    BMET  Recent Labs  08/01/14 0545 08/02/14 0500  NA 138 143  K 4.0 3.9  CL 108 111  CO2 26 27  GLUCOSE 138* 126*  BUN 9 11  CREATININE 0.53 0.55  CALCIUM 7.2* 7.4*   PT/INR No results for input(s): LABPROT, INR in the last 72 hours.   Recent Labs Lab 07/28/14 0830 08/01/14 0545 08/02/14 0500  AST 16 25 21   ALT 10 23 17   ALKPHOS 78 58 60  BILITOT 0.3 0.6 0.7  PROT 6.7 5.2* 5.2*  ALBUMIN  3.4* 2.2* 2.1*     Lipase     Component Value Date/Time   LIPASE 20 07/28/2014 0830     Studies/Results: Dg Chest Port 1 View  08/01/2014   CLINICAL DATA:  Postoperative bile leak.  Hypertension.  EXAM: PORTABLE CHEST - 1 VIEW  COMPARISON:  July 30, 2014.  FINDINGS: Stable cardiomediastinal silhouette. Nasogastric tube is seen entering stomach. Right internal jugular catheter is stable in position with tip overlying expected position of the SVC. No pneumothorax is noted. Increased left perihilar opacity is noted concerning for pneumonia. Significantly increased opacity is noted in the right middle lobe concerning for pneumonia. Right upper lobe opacity is also noted concerning for pneumonia. No significant pleural effusion is noted. Bony thorax is intact.  IMPRESSION: Increased bilateral upper lobe and right middle lobe opacities are noted most consistent with pneumonia or possibly edema. Followup radiographs are recommended.   Electronically Signed   By: Marijo Conception, M.D.   On: 08/01/2014 10:35    Medications: . sodium chloride   Intravenous Once  . morphine   Intravenous 6 times per day  . pantoprazole (PROTONIX) IV  40 mg Intravenous Q24H  . piperacillin-tazobactam (ZOSYN)  IV  3.375 g Intravenous Q8H   . 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 STARK. Assessment/Plan 1.  Right colon tumor, peritoneal implants and right upper quadrant, right lateral abdominal wall, and on gallbladder, cholelithiasis 1A.  Laparoscopy converted to exploratory laparotomy, extended right colectomy, biopsy of peritoneal nodules on diaphragm with closure of diaphragm, cholecystectomy, 07/30/14 Dr. Zella Richer. 1B.  Bile leak 2. Anemia/Leukocytosis 3. Hypertension 4. Tobacco use 39 years 5. Hx of ETOH use quit 4 years ago 6.  Dyslipidemia  7. DVT prophylaxis - SCD 8. He had two doses of Zosyn day 1 - 07/29/14. 9. Chronic benzodiazapine use - very anxious   Plan:  He is set up for a HIDA this AM.  NG is working, will continue for now.  I have talked to GI and they will see again today.  Still on PCA, start dressing changes. Add flutter valve, OOB to chair today.  He still has an A line in, I will discuss with Dr. Redmond Pulling.       LOS: 5 days   Tajanay Hurley 08/02/2014

## 2014-08-02 NOTE — Consult Note (Signed)
Daily Rounding Note  08/02/2014, 9:47 AM  LOS: 5 days   SUBJECTIVE:       Pain persists is upper, right abdomen.  Mostly hurts when he coughs.  Ongoing SOB, on 6 liters [place] oxygen Bilious drainage per JP drain of 400cc on 2/26,  495cc on 2/27, 90cc on 2/28.  OBJECTIVE:         Vital signs in last 24 hours:    Temp:  [98.2 F (36.8 C)-99.4 F (37.4 C)] 98.2 F (36.8 C) (02/29 0825) Pulse Rate:  [75-96] 80 (02/29 0900) Resp:  [19-27] 24 (02/29 0900) BP: (124-167)/(53-79) 136/57 mmHg (02/29 0900) SpO2:  [91 %-97 %] 91 % (02/29 0900) Weight:  [169 lb 1.5 oz (76.7 kg)] 169 lb 1.5 oz (76.7 kg) (02/29 0400) Last BM Date: 07/29/14 Filed Weights   07/29/14 0945 07/31/14 0400 08/02/14 0400  Weight: 163 lb (73.936 kg) 167 lb 12.3 oz (76.1 kg) 169 lb 1.5 oz (76.7 kg)   General: looks pale, unwell.  Not in pain.    Heart: RRR Chest: crackles in right base, audible in front.   Abdomen: soft, JP drain is clear, medium brown/gold in color.   Extremities: no CCE Neuro/Psych:  Cooperative, relaxed, not confused.   Intake/Output from previous day: 02/28 0701 - 02/29 0700 In: 2445 [I.V.:2260; NG/GT:30; IV Piggyback:50] Out: 2255 [Urine:1765; Emesis/NG output:400; Drains:90]  Intake/Output this shift:    Lab Results:  Recent Labs  07/31/14 0535 08/01/14 0545 08/02/14 0500  WBC 13.2* 21.0* 14.1*  HGB 8.9* 8.1* 7.1*  HCT 28.7* 26.8* 23.8*  PLT 395 359 326   BMET  Recent Labs  07/31/14 0535 08/01/14 0545 08/02/14 0500  NA 140 138 143  K 3.8 4.0 3.9  CL 109 108 111  CO2 27 26 27   GLUCOSE 154* 138* 126*  BUN 10 9 11   CREATININE 0.71 0.53 0.55  CALCIUM 7.4* 7.2* 7.4*   LFT  Recent Labs  08/01/14 0545 08/02/14 0500  PROT 5.2* 5.2*  ALBUMIN 2.2* 2.1*  AST 25 21  ALT 23 17  ALKPHOS 58 60  BILITOT 0.6 0.7   PT/INR No results for input(s): LABPROT, INR in the last 72 hours. Hepatitis Panel No results for  input(s): HEPBSAG, HCVAB, HEPAIGM, HEPBIGM in the last 72 hours.  Studies/Results: Dg Chest Port 1 View  08/01/2014   CLINICAL DATA:  Postoperative bile leak.  Hypertension.  EXAM: PORTABLE CHEST - 1 VIEW  COMPARISON:  July 30, 2014.  FINDINGS: Stable cardiomediastinal silhouette. Nasogastric tube is seen entering stomach. Right internal jugular catheter is stable in position with tip overlying expected position of the SVC. No pneumothorax is noted. Increased left perihilar opacity is noted concerning for pneumonia. Significantly increased opacity is noted in the right middle lobe concerning for pneumonia. Right upper lobe opacity is also noted concerning for pneumonia. No significant pleural effusion is noted. Bony thorax is intact.  IMPRESSION: Increased bilateral upper lobe and right middle lobe opacities are noted most consistent with pneumonia or possibly edema. Followup radiographs are recommended.   Electronically Signed   By: Marijo Conception, M.D.   On: 08/01/2014 10:35   Scheduled Meds: . sodium chloride   Intravenous Once  . morphine   Intravenous 6 times per day  . pantoprazole (PROTONIX) IV  40 mg Intravenous Q24H  . piperacillin-tazobactam (ZOSYN)  IV  3.375 g Intravenous Q8H   Continuous Infusions: . dextrose 5 % and 0.9 % NaCl  with KCl 20 mEq/L 125 mL/hr at 08/02/14 0858   PRN Meds:.diphenhydrAMINE **OR** diphenhydrAMINE, LORazepam, naloxone **AND** sodium chloride, ondansetron **OR** ondansetron (ZOFRAN) IV, ondansetron (ZOFRAN) IV   ASSESMENT:   *  Hematochezia, IDA, abnormal CT 07/30/13 Colonoscopy: friable mass at hepatic flexure, sigmoid/descending tics.   *  S/P 07/30/14 right colectomy and TI resection, cholecystectomy, removal of diaphragmatic peritoneal implant.  "large bulky tumor adherent to the second portion of duodenum and anterior pancreas as well as the middle colic vein. There were peritoneal implants on the gallbladder in the right upper quadrant and right  mid abdominal wall areas. There were multiple omental implants as well".  No obvious liver lesions at surgery.   *  Postop bile leak?  For HIDA scan now.     *  Anemia due to chronic and acute  blood loss.  S/p 3 PRBCs thus far.   *  bil upper lobe, RML lobe opacities.  ? PNA vs edema. No echo in records and no CM on xray. On Zosyn.    PLAN   *  Await results of HIDA, if + for leak will need to discuss ERCP/stent placement with pt.   *  One unit blood ordered for today by Dr Rockne Menghini.     Azucena Freed  08/02/2014, 9:47 AM Pager: [phone]  GI Attending Note   Chart was reviewed and patient was examined. X-rays and lab were reviewed.   A bile leak was demonstrated by hepatobiliary scan.  This accounts for copious biliary drainage from the JP drain.  While he will require an ERCP with stent placement, I will defer until his pulmonary status improves a bit.  Sandy Salaam. Deatra Ina, M.D., [facility] Cell [phone]

## 2014-08-02 NOTE — Progress Notes (Signed)
PT demonstrated verbal and hands on understanding of Flutter device. 

## 2014-08-02 NOTE — Progress Notes (Signed)
RT visited PT to find him sleeping- will re-visit for Flutter instruction.

## 2014-08-02 NOTE — Progress Notes (Signed)
Progress Note   WESAM GEARHART TOI:[id number] DOB: [birth year] DOA: 07/28/2014 PCP: [name], MD   Brief Narrative:   Donald Fry is an 59 y.o. male with a PMH of hyperlipidemia, anemia, hypertension and tobacco abuse who was admitted 07/28/14 with chief complaint of 4-5 day history of right upper quadrant pain. He also has had a several month history of dyspnea on exertion, unintentional weight loss. A CT scan of the abdomen and pelvis was done on admission which showed a possible colon cancer near the hepatic flexure with surrounding lymphadenopathy. He has not had a colonoscopy in the past. Underwent colonoscopy 07/29/14 followed by surgical resection of colon cancer 07/30/14.  Assessment/Plan:   Principal problem: Chronic lower GI bleed / chronic blood loss anemia secondary to bleeding colonic mass - Chronic lower GI bleeding related to large colon mass, confirmed by colonoscopy done 07/29/14.  - S/P a total of 3 units of PRBCs. Hemoglobin 7.1 this morning. We'll give 1 additional unit today. - Status post exploratory laparotomy, extended right colectomy, biopsy of peritoneal nodules and cholecystectomy on 07/30/14. 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 Will need oncology consultation when stable.  Active problems: Bile leak - HIDA confirms bile leak.  Surgery aware, consulted GI.   Hypertension  - Atenolol on hold while NPO.  Hyperlipidemia  - Statin on hold while NPO.   Anxiety  - Continue Ativan as needed.  Tobacco abuse  - Counseled for cessation; continue nicotine patch.  Hypokalemia  - Repleted.  Leukocytosis /Acute respiratory failure with hypoxia / Probable HCAP / bronchospasm - Treated with empiric Zosyn given rising WBC and possibility of microperforation from colonic mass. - Antibiotics discontinued post surgery. Significant jump in WBC noted 08/01/14, so Zosyn was resumed. WBC improved on empiric  antibiotics. - CXR shows possible PNA versus fluid. On Zosyn.   - HIDA scan ordered by surgery to assess for bile leak. - Continue supplemental oxygen. - Add albuterol for bronchospasm.   DVT Prophylaxis -SCDs ordered.  Code Status: Full. Family Communication: Wife and other family updated at bedside. Disposition Plan: Home when stable.   IV Access:    Peripheral IV   Procedures and diagnostic studies:   Ct Abdomen Pelvis W Contrast 07/28/14: 1. Markedly abnormal hepatic flexure with wall thickening along a 10-12 cm length but extensive surrounding mesenteric nodularity, and increased regional lymph nodes. The appearance is atypical for acute colitis and highly suspicious for adenocarcinoma. GI consultation for followup colonoscopy recommended. 2. Small volume perihepatic fluid. Multiple up to 14 mm indeterminate low-density lesions in the liver. Several prominent but indeterminate retroperitoneal lymph nodes in the upper abdomen. 3. Abnormal fat stranding/nodularity continues to the gallbladder fossa, but the gallbladder does not appear inflamed.    Colonoscopy 07/29/14: 1. Large circumferential obstructing friable mass at the hepaticflexure; multiple biopsies performed2. Mild diverticulosis was noted in the sigmoid colon anddescending colon.  Laparoscopy converted to exploratory laparotomy, extended right colectomy, biopsy of peritoneal nodules on diaphragm with closure of diaphragm, cholecystectomy 07/30/14: Done by Dr. Zella Richer.  Dg Chest Port 1 View 08/01/2014: Increased bilateral upper lobe and right middle lobe opacities are noted most consistent with pneumonia or possibly edema. Followup radiographs are recommended.    Nm Hepatobiliary Including Gb 08/02/2014: 1. Abnormal radiotracer collection adjacent to the inferior margin of the left hepatic lobe most concerning for a bile leak.   Medical Consultants:    Dr. Lucio Edward, GI.  Dr. Sherren Mocha  [place]osen[place],  Surgery  Anti-Infectives:    Zosyn 07/29/14---> 07/30/14, resumed 08/01/14  Subjective:   Nicholos Johns is still complaining of cough.  He feels like he has a lot of gas. No flatus.  Objective:    Filed Vitals:   08/02/14 0300 08/02/14 0400 08/02/14 0500 08/02/14 0600  BP: 143/68 135/67 124/56 137/56  Pulse: 82 81 82 77  Temp:  98.3 F (36.8 C)    TempSrc:      Resp: 25 24 23 19   Height:      Weight:  76.7 kg (169 lb 1.5 oz)    SpO2: 94% 96% 92% 95%    Intake/Output Summary (Last 24 hours) at 08/02/14 1761 Last data filed at 08/02/14 0600  Gross per 24 hour  Intake 2319.98 ml  Output   1930 ml  Net 389.98 ml    Exam: Gen:  NAD, NG tube in Cardiovascular:  RRR, No M/R/G Respiratory:  Lungs with expiratory wheezes Gastrointestinal:  Abdomen soft, tender, + sluggish bowel sounds Extremities:  No C/E/C   Data Reviewed:    Labs: Basic Metabolic Panel:  Recent Labs Lab 07/29/14 0555 07/30/14 0540 07/30/14 1652 07/31/14 0535 08/01/14 0545 08/02/14 0500  NA 137 139 140 140 138 143  K 4.0 3.4* 3.7 3.8 4.0 3.9  CL 100 103  --  109 108 111  CO2 29 29  --  27 26 27   GLUCOSE 98 100* 150* 154* 138* 126*  BUN 19 14  --  10 9 11   CREATININE 0.68 0.68  --  0.71 0.53 0.55  CALCIUM 8.5 8.1*  --  7.4* 7.2* 7.4*   GFR Estimated Creatinine Clearance: 98.2 mL/min (by C-G formula based on Cr of 0.55). Liver Function Tests:  Recent Labs Lab 07/28/14 0830 08/01/14 0545 08/02/14 0500  AST 16 25 21   ALT 10 23 17   ALKPHOS 78 58 60  BILITOT 0.3 0.6 0.7  PROT 6.7 5.2* 5.2*  ALBUMIN 3.4* 2.2* 2.1*    Recent Labs Lab 07/28/14 0830  LIPASE 20   CBC:  Recent Labs Lab 07/28/14 0755 07/28/14 0830 07/29/14 0555 07/30/14 0540 07/30/14 1652 07/31/14 0535 08/01/14 0545 08/02/14 0500  WBC SPECIMEN CLOTTED 18.7* 15.5* 13.2*  --  13.2* 21.0* 14.1*  NEUTROABS PENDING 15.6*  --   --   --   --   --   --   HGB SPECIMEN CLOTTED 7.9* 8.0* 7.7* 9.2* 8.9* 8.1* 7.1*   HCT SPECIMEN CLOTTED 26.8* 27.5* 26.2* 27.0* 28.7* 26.8* 23.8*  MCV SPECIMEN CLOTTED 78.4 79.7 78.9  --  78.6 81.5 81.8  PLT SPECIMEN CLOTTED 526* 488* 427*  --  395 359 326   Cardiac Enzymes: No results for input(s): CKTOTAL, CKMB, CKMBINDEX, TROPONINI in the last 168 hours.  Microbiology No results found for this or any previous visit (from the past 240 hour(s)).   Medications:   . morphine   Intravenous 6 times per day  . pantoprazole (PROTONIX) IV  40 mg Intravenous Q24H  . piperacillin-tazobactam (ZOSYN)  IV  3.375 g Intravenous Q8H   Continuous Infusions: . dextrose 5 % and 0.9 % NaCl with KCl 20 mEq/L 125 mL/hr at 08/01/14 2347    Time spent: 35 minutes.  The patient is medically complex and requires high complexity decision making.    LOS: 5 days   [facility] Pager [phone]. If unable to reach me by pager, please call my cell phone at [phone].  *Please refer to [email or web address], password  [email or web address] to get updated schedule on who will round on this patient, as hospitalists switch teams weekly. If 7PM-7AM, please contact night-coverage at [email or web address], password [email or web address] for any overnight needs.  08/02/2014, 8:12 AM

## 2014-08-02 NOTE — Care Management Note (Signed)
CARE MANAGEMENT NOTE 08/02/2014  Patient:  Donald Fry, Donald Fry   Account Number:  [id number]  Date Initiated:  08/02/2014  Documentation initiated by:  DAVIS,RHONDA  Subjective/Objective Assessment:   pod 3:Laparoscopy converted to exploratory laparotomy, extended right colectomy, biopsy of peritoneal nodules on diaphragm with closure of diaphragm, cholecystectomy, 07/30/14     Action/Plan:   tbd still has a. line in [id number]/pna on cxr-ng tube to drainage/remains BOF/BP10 at 50%   Anticipated DC Date:  08/05/2014   Anticipated DC Plan:  HOME/SELF CARE  In-house referral  NA      DC Planning Services  NA      [facility] Choice  NA   Choice offered to / List presented to:  NA   DME arranged  NA      DME agency  NA     Centre arranged  NA      [place] agency  NA   Status of service:  In process, will continue to follow Medicare Important Message given?   (If response is "NO", the following Medicare IM given date fields will be blank) Date Medicare IM given:   Medicare IM given by:   Date Additional Medicare IM given:   Additional Medicare IM given by:    Discharge Disposition:    Per UR Regulation:  Reviewed for med. necessity/level of care/duration of stay  If discussed at Accident of Stay Meetings, dates discussed:    Comments:  Feb. 29 2016/Rhonda L. Rosana Hoes, RN, BSN, CCM. Case Management [place] [phone] No discharge needs present of time of review.

## 2014-08-03 DIAGNOSIS — J189 Pneumonia, unspecified organism: Secondary | ICD-10-CM

## 2014-08-03 DIAGNOSIS — K929 Disease of digestive system, unspecified: Secondary | ICD-10-CM

## 2014-08-03 DIAGNOSIS — K838 Other specified diseases of biliary tract: Secondary | ICD-10-CM

## 2014-08-03 LAB — TYPE AND SCREEN
ABO/RH(D): A POS
Antibody Screen: NEGATIVE
UNIT DIVISION: 0

## 2014-08-03 LAB — COMPREHENSIVE METABOLIC PANEL
ALT: 17 U/L (ref 0–53)
ANION GAP: 5 (ref 5–15)
AST: 18 U/L (ref 0–37)
Albumin: 1.9 g/dL — ABNORMAL LOW (ref 3.5–5.2)
Alkaline Phosphatase: 93 U/L (ref 39–117)
BILIRUBIN TOTAL: 1.4 mg/dL — AB (ref 0.3–1.2)
BUN: 11 mg/dL (ref 6–23)
CALCIUM: 7.5 mg/dL — AB (ref 8.4–10.5)
CHLORIDE: 112 mmol/L (ref 96–112)
CO2: 25 mmol/L (ref 19–32)
CREATININE: 0.54 mg/dL (ref 0.50–1.35)
GFR calc Af Amer: >90 mL/min (ref >90)
GFR calc non Af Amer: >90 mL/min (ref >90)
GLUCOSE: 128 mg/dL — AB (ref 70–99)
Potassium: 4 mmol/L (ref 3.5–5.1)
Sodium: 142 mmol/L (ref 135–145)
Total Protein: 5.3 g/dL — ABNORMAL LOW (ref 6.0–8.3)

## 2014-08-03 LAB — CBC
HEMATOCRIT: 25.1 % — AB (ref 39.0–52.0)
Hemoglobin: 7.7 g/dL — ABNORMAL LOW (ref 13.0–17.0)
MCH: 24.8 pg — ABNORMAL LOW (ref 26.0–34.0)
MCHC: 30.7 g/dL (ref 30.0–36.0)
MCV: 80.7 fL (ref 78.0–100.0)
Platelets: 333 10*3/uL (ref 150–400)
RBC: 3.11 MIL/uL — AB (ref 4.22–5.81)
RDW: 17.6 % — ABNORMAL HIGH (ref 11.5–15.5)
WBC: 11.3 10*3/uL — AB (ref 4.0–10.5)

## 2014-08-03 NOTE — Progress Notes (Signed)
Progress Note   ZAVON HYSON ZOX:[id number] DOB: Feb 11, 1956 DOA: 07/28/2014 PCP: [name], MD   Brief Narrative:   Donald Fry is an 59 y.o. male with a PMH of hyperlipidemia, anemia, hypertension and tobacco abuse who was admitted 07/28/14 with chief complaint of 4-5 day history of right upper quadrant pain. He also has had a several month history of dyspnea on exertion, unintentional weight loss. A CT scan of the abdomen and pelvis was done on admission which showed a possible colon cancer near the hepatic flexure with surrounding lymphadenopathy. He has not had a colonoscopy in the past. Underwent colonoscopy 07/29/14 followed by surgical resection of colon cancer 07/30/14. His postoperative course has been complicated by a bile leak and healthcare associated pneumonia.  Assessment/Plan:   Principal problem: Chronic lower GI bleed / chronic blood loss anemia secondary to bleeding colonic mass - Chronic lower GI bleeding related to large colon mass, confirmed by colonoscopy done 07/29/14.  - S/P a total of 4 units of PRBCs. Hemoglobin 7.7 this morning.  - Status post exploratory laparotomy, extended right colectomy, biopsy of peritoneal nodules and cholecystectomy on 07/30/14. 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 Will need oncology consultation when stable, which can be done as an outpatient, given that he will need to recuperate from his surgery before any further therapy can be initiated. - NG tube being clamped today.  Active problems: Bile leak - HIDA confirms bile leak.  Surgery aware, consulted GI for ERCP/stent placement, but no immediate plans, hoping the bile leak seals on its own.   Hypertension  - Atenolol on hold while NPO.  Hyperlipidemia  - Statin on hold while NPO.   Anxiety  - Continue Ativan as needed.  Tobacco abuse  - Counseled for cessation; continue nicotine patch.  Hypokalemia  -  Repleted.  Leukocytosis /Acute respiratory failure with hypoxia / Probable HCAP / bronchospasm - Treated with empiric Zosyn on admission given rising WBC and possibility of microperforation from colonic mass. - Antibiotics discontinued post surgery. Significant jump in WBC noted 08/01/14, so Zosyn was resumed. WBC improved on empiric antibiotics. - CXR shows possible PNA versus fluid. On Zosyn.   - HIDA scan + for bile leak. - Continue supplemental oxygen. - Continue albuterol for bronchospasm.   DVT Prophylaxis -SCDs ordered.  Code Status: Full. Family Communication: Wife updated at bedside. Disposition Plan: Home when stable.   IV Access:    CVC double-lumen placed internal jugular 07/30/14   Procedures and diagnostic studies:   Ct Abdomen Pelvis W Contrast 07/28/14: 1. Markedly abnormal hepatic flexure with wall thickening along a 10-12 cm length but extensive surrounding mesenteric nodularity, and increased regional lymph nodes. The appearance is atypical for acute colitis and highly suspicious for adenocarcinoma. GI consultation for followup colonoscopy recommended. 2. Small volume perihepatic fluid. Multiple up to 14 mm indeterminate low-density lesions in the liver. Several prominent but indeterminate retroperitoneal lymph nodes in the upper abdomen. 3. Abnormal fat stranding/nodularity continues to the gallbladder fossa, but the gallbladder does not appear inflamed.    Colonoscopy 07/29/14: 1. Large circumferential obstructing friable mass at the hepaticflexure; multiple biopsies performed2. Mild diverticulosis was noted in the sigmoid colon anddescending colon.  Laparoscopy converted to exploratory laparotomy, extended right colectomy, biopsy of peritoneal nodules on diaphragm with closure of diaphragm, cholecystectomy 07/30/14: Done by Dr. Zella Richer.  Dg Chest Port 1 View 08/01/2014: Increased bilateral upper lobe and right middle lobe opacities are noted  most consistent with  pneumonia or possibly edema. Followup radiographs are recommended.    Nm Hepatobiliary Including Gb 08/02/2014: 1. Abnormal radiotracer collection adjacent to the inferior margin of the left hepatic lobe most concerning for a bile leak.    Medical Consultants:    Dr. Lucio Edward, GI.  Dr. Jackolyn Confer, Surgery  Anti-Infectives:    Zosyn 07/29/14---> 07/30/14, resumed 08/01/14  Subjective:   Nicholos Johns denies dyspnea at rest, but gets short of breath with activity.  Still complaining of cough, occasionally productive of white mucous.  No flatus today, but tells me "I did the day before yesterday".  Objective:    Filed Vitals:   08/03/14 0200 08/03/14 0400 08/03/14 0500 08/03/14 0600  BP: 142/70 124/53 132/55 135/64  Pulse: 97 86 82 81  Temp:  99.4 F (37.4 C)    TempSrc:  Oral    Resp: 22 26 23 25   Height:      Weight:      SpO2: 96% 90% 96% 98%    Intake/Output Summary (Last 24 hours) at 08/03/14 0719 Last data filed at 08/03/14 0600  Gross per 24 hour  Intake 2978.75 ml  Output   2675 ml  Net 303.75 ml    Exam: Gen:  NAD, NG tube in Cardiovascular:  RRR, No M/R/G Respiratory:  Lungs clear, wheezing has resolved Gastrointestinal:  Abdomen soft, tender, +bowel sounds Extremities:  Trace edema   Data Reviewed:    Labs: Basic Metabolic Panel:  Recent Labs Lab 07/30/14 0540 07/30/14 1652 07/31/14 0535 08/01/14 0545 08/02/14 0500 08/03/14 0530  NA 139 140 140 138 143 142  K 3.4* 3.7 3.8 4.0 3.9 4.0  CL 103  --  109 108 111 112  CO2 29  --  27 26 27 25   GLUCOSE 100* 150* 154* 138* 126* 128*  BUN 14  --  10 9 11 11   CREATININE 0.68  --  0.71 0.53 0.55 0.54  CALCIUM 8.1*  --  7.4* 7.2* 7.4* 7.5*   GFR Estimated Creatinine Clearance: 98.2 mL/min (by C-G formula based on Cr of 0.54). Liver Function Tests:  Recent Labs Lab 07/28/14 0830 08/01/14 0545 08/02/14 0500 08/03/14 0530  AST 16 25 21 18   ALT 10 23 17 17   ALKPHOS 78 58 60 93   BILITOT 0.3 0.6 0.7 1.4*  PROT 6.7 5.2* 5.2* 5.3*  ALBUMIN 3.4* 2.2* 2.1* 1.9*    Recent Labs Lab 07/28/14 0830  LIPASE 20   CBC:  Recent Labs Lab 07/28/14 0755 07/28/14 0830  07/30/14 0540 07/30/14 1652 07/31/14 0535 08/01/14 0545 08/02/14 0500 08/03/14 0530  WBC SPECIMEN CLOTTED 18.7*  < > 13.2*  --  13.2* 21.0* 14.1* 11.3*  NEUTROABS PENDING 15.6*  --   --   --   --   --   --   --   HGB SPECIMEN CLOTTED 7.9*  < > 7.7* 9.2* 8.9* 8.1* 7.1* 7.7*  HCT SPECIMEN CLOTTED 26.8*  < > 26.2* 27.0* 28.7* 26.8* 23.8* 25.1*  MCV SPECIMEN CLOTTED 78.4  < > 78.9  --  78.6 81.5 81.8 80.7  PLT SPECIMEN CLOTTED 526*  < > 427*  --  395 359 326 333  < > = values in this interval not displayed.  Microbiology No results found for this or any previous visit (from the past 240 hour(s)).   Medications:   . albuterol  2.5 mg Nebulization Q6H  . morphine   Intravenous 6 times per day  .  pantoprazole (PROTONIX) IV  40 mg Intravenous Q24H  . piperacillin-tazobactam (ZOSYN)  IV  3.375 g Intravenous Q8H   Continuous Infusions: . dextrose 5 % and 0.9 % NaCl with KCl 20 mEq/L 100 mL/hr at 08/03/14 0544    Time spent: 35 minutes.  The patient is medically complex and requires high complexity decision making.    LOS: 6 days   [facility] Pager [phone]. If unable to reach me by pager, please call my cell phone at [phone].  *Please refer to [email or web address], password [email or web address] to get updated schedule on who will round on this patient, as hospitalists switch teams weekly. If 7PM-7AM, please contact night-coverage at [email or web address], password [email or web address] for any overnight needs.  08/03/2014, 7:19 AM

## 2014-08-03 NOTE — Progress Notes (Signed)
Patient ID: Donald Fry, male   DOB: 1956-03-29, 59 y.o.   MRN: [id number]   HIDA scan was positive 2/29 , however JP drainage is decreasing steadily.   Surgery called and would like to hold off on ercp with stent  For a few days and see if leak will close on its own.  We will be available. Please call back if GI needed.  GI Attending Note  I have personally taken an interval history, reviewed the chart, and examined the patient.  I agree with the extender's note, impression and recommendations.  Sandy Salaam. Deatra Ina, MD, [facility] [phone]

## 2014-08-03 NOTE — Progress Notes (Signed)
4 Days Post-Op  Subjective: Up in the chair feels better some flatus yesterday, Not much from the NG, drain not recorded but PM nurse reports about 30 ml last shift, they do not know what the day staff had for drain output.  He looks better and isn't wheezing like yesterday.    Objective: Vital signs in last 24 hours: Temp:  [98.1 F (36.7 C)-100.9 F (38.3 C)] 99.4 F (37.4 C) (03/01 0400) Pulse Rate:  [79-97] 81 (03/01 0600) Resp:  [16-33] 25 (03/01 0700) BP: (124-152)/(47-70) 136/60 mmHg (03/01 0700) SpO2:  [90 %-98 %] 98 % (03/01 0600) Last BM Date: 07/29/14 250 from the NG Transfused 1 unit yesterday Tm 100.9, RR up, otherwise stable Labs OK, H/H 7.7/25 Intake/Output from previous day: 02/29 0701 - 03/01 0700 In: 3328.8 [I.V.:2378.8; Blood:335; NG/GT:380; IV Piggyback:150] Out: 2675 [Urine:2425; Emesis/NG output:250] Intake/Output this shift:    General appearance: alert, cooperative and no distress Resp: clear to auscultation bilaterally and he has some end expiratory wheezing but nothing like yesterday.  Much better. Cardio: regular rate and rhythm, S1, S2 normal, no murmur, click, rub or gallop GI: He is up in the chair, but comfortable, no distension noted in this position.  few BS, no BM and not much flatus. Extremities: no significant lower leg edema.  Lab Results:   Recent Labs  08/02/14 0500 08/03/14 0530  WBC 14.1* 11.3*  HGB 7.1* 7.7*  HCT 23.8* 25.1*  PLT 326 333    BMET  Recent Labs  08/02/14 0500 08/03/14 0530  NA 143 142  K 3.9 4.0  CL 111 112  CO2 27 25  GLUCOSE 126* 128*  BUN 11 11  CREATININE 0.55 0.54  CALCIUM 7.4* 7.5*   PT/INR No results for input(s): LABPROT, INR in the last 72 hours.   Recent Labs Lab 07/28/14 0830 08/01/14 0545 08/02/14 0500 08/03/14 0530  AST 16 25 21 18   ALT 10 23 17 17   ALKPHOS 78 58 60 93  BILITOT 0.3 0.6 0.7 1.4*  PROT 6.7 5.2* 5.2* 5.3*  ALBUMIN 3.4* 2.2* 2.1* 1.9*     Lipase      Component Value Date/Time   LIPASE 20 07/28/2014 0830     Studies/Results: Nm Hepatobiliary Including Gb  08/02/2014   CLINICAL DATA:  Status post cholecystectomy 07/30/2014  EXAM: NUCLEAR MEDICINE HEPATOBILIARY IMAGING  TECHNIQUE: Sequential images of the abdomen were obtained out to 60 minutes following intravenous administration of radiopharmaceutical.  RADIOPHARMACEUTICALS:  5.5 Millicurie [id number]-[id number] Choletec  COMPARISON:  None.  FINDINGS: Flow study shows uniform distribution of radioactivity in the liver. Sequential imaging of the liver and abdomen over a period of 60 minutes shows uniform uptake of the tracer in the liver.  Prior cholecystectomy. Filling of the common bile duct. Radiotracer uptake is present in the small bowel at 20 minutes. There is abnormal radiotracer collection adjacent to the inferior margin of the left hepatic lobe most concerning for a bile leak.  At the end of 1-hour, there is relatively poor clearance of activity from the liver.  IMPRESSION: 1. Abnormal radiotracer collection adjacent to the inferior margin of the left hepatic lobe most concerning for a bile leak. These results were called by telephone at the time of interpretation on 08/02/2014 at 12:01 pm to Dr. Jacquelynn Cree , who verbally acknowledged these results.   Electronically Signed   By: Kathreen Devoid   On: 08/02/2014 12:18   Dg Chest Port 1 View  08/01/2014   CLINICAL DATA:  Postoperative bile leak.  Hypertension.  EXAM: PORTABLE CHEST - 1 VIEW  COMPARISON:  July 30, 2014.  FINDINGS: Stable cardiomediastinal silhouette. Nasogastric tube is seen entering stomach. Right internal jugular catheter is stable in position with tip overlying expected position of the SVC. No pneumothorax is noted. Increased left perihilar opacity is noted concerning for pneumonia. Significantly increased opacity is noted in the right middle lobe concerning for pneumonia. Right upper lobe opacity is also noted concerning for pneumonia.  No significant pleural effusion is noted. Bony thorax is intact.  IMPRESSION: Increased bilateral upper lobe and right middle lobe opacities are noted most consistent with pneumonia or possibly edema. Followup radiographs are recommended.   Electronically Signed   By: Marijo Conception, M.D.   On: 08/01/2014 10:35    Medications: . albuterol  2.5 mg Nebulization Q6H  . morphine   Intravenous 6 times per day  . pantoprazole (PROTONIX) IV  40 mg Intravenous Q24H  . piperacillin-tazobactam (ZOSYN)  IV  3.375 g Intravenous Q8H    Assessment/Plan 1. Right colon tumor, peritoneal implants and right upper quadrant, right lateral abdominal wall, and on gallbladder, cholelithiasis 1A. Laparoscopy converted to exploratory laparotomy, extended right colectomy, biopsy of peritoneal nodules on diaphragm with closure of diaphragm, cholecystectomy, 07/30/14 Dr. Zella Richer. 1B. Bile leak 2. Anemia/Leukocytosis 3. Hypertension 4. Tobacco use 39 years 5. Hx of ETOH use quit 4 years ago 6. Dyslipidemia  7. DVT prophylaxis - SCD 8. Zosyn day 5 since admission. 9. Chronic benzodiazapine use - very anxious    Plan:  Dr. Redmond Pulling has seen him and would like to hold off on ERCP today.  He would like for this to have a chance to seal on it's own, if possible.  We are going to clamp the NG and see how he does, d/c foley and aline. Surgical path still pending.    LOS: 6 days    Donald Fry 08/03/2014

## 2014-08-04 ENCOUNTER — Telehealth: Payer: Self-pay | Admitting: Oncology

## 2014-08-04 LAB — AMYLASE: Amylase: 26 U/L (ref 0–105)

## 2014-08-04 LAB — BASIC METABOLIC PANEL
Anion gap: 6 (ref 5–15)
BUN: 12 mg/dL (ref 6–23)
CALCIUM: 7.9 mg/dL — AB (ref 8.4–10.5)
CHLORIDE: 112 mmol/L (ref 96–112)
CO2: 26 mmol/L (ref 19–32)
Creatinine, Ser: 0.44 mg/dL — ABNORMAL LOW (ref 0.50–1.35)
GFR calc Af Amer: >90 mL/min (ref >90)
GFR calc non Af Amer: >90 mL/min (ref >90)
GLUCOSE: 121 mg/dL — AB (ref 70–99)
POTASSIUM: 3.9 mmol/L (ref 3.5–5.1)
SODIUM: 144 mmol/L (ref 135–145)

## 2014-08-04 LAB — CBC
HCT: 24.7 % — ABNORMAL LOW (ref 39.0–52.0)
Hemoglobin: 7.7 g/dL — ABNORMAL LOW (ref 13.0–17.0)
MCH: 25.1 pg — AB (ref 26.0–34.0)
MCHC: 31.2 g/dL (ref 30.0–36.0)
MCV: 80.5 fL (ref 78.0–100.0)
PLATELETS: 340 10*3/uL (ref 150–400)
RBC: 3.07 MIL/uL — AB (ref 4.22–5.81)
RDW: 18.2 % — AB (ref 11.5–15.5)
WBC: 9.5 10*3/uL (ref 4.0–10.5)

## 2014-08-04 LAB — MRSA PCR SCREENING: MRSA BY PCR: NEGATIVE

## 2014-08-04 LAB — AMYLASE, PERITONEAL FLUID: AMYLASE, PERITONEAL FLUID: 9 U/L

## 2014-08-04 MED ORDER — ATENOLOL 50 MG PO TABS
100.0000 mg | ORAL_TABLET | Freq: Every day | ORAL | Status: DC
  Administered 2014-08-04 – 2014-08-16 (×12): 100 mg via ORAL
  Filled 2014-08-04 (×4): qty 2
  Filled 2014-08-04: qty 4
  Filled 2014-08-04 (×8): qty 2

## 2014-08-04 MED ORDER — HEPARIN SODIUM (PORCINE) 5000 UNIT/ML IJ SOLN
5000.0000 [IU] | Freq: Three times a day (TID) | INTRAMUSCULAR | Status: DC
  Administered 2014-08-04 – 2014-08-15 (×33): 5000 [IU] via SUBCUTANEOUS
  Filled 2014-08-04 (×33): qty 1

## 2014-08-04 MED ORDER — HYDRALAZINE HCL 20 MG/ML IJ SOLN: 10.0000 mg | Freq: Four times a day (QID) | INTRAMUSCULAR | Status: DC | PRN

## 2014-08-04 NOTE — Progress Notes (Addendum)
Patient ID: Donald Fry, male   DOB: 1956/02/28, 59 y.o.   MRN: [id number] [facility] PROGRESS NOTE  Donald Fry PPJ:[id number] DOB: [birth year] DOA: 07/28/2014 PCP: [name], MD  Brief narrative:    59 y.o. male with a PMH of hyperlipidemia, anemia, hypertension and tobacco abuse who was admitted 07/28/14 with right upper quadrant abdominal pain for couple of days prior to this admission. Patient also reported having ongoing dyspnea on exertion for several months prior to the admission and unintentional weight loss. Evaluation on the admission included CT abdomen which showed a possible colon cancer near the hepatic flexure with surrounding lymphadenopathy. Patient underwent colonoscopy 07/29/2014 followed by surgical resection of the colon cancer 07/30/2014. Postoperative course has been complicated with bile leak and healthcare associated pneumonia.   Assessment/Plan:    Principal problem: Chronic lower GI bleed / chronic blood loss anemia secondary to bleeding colonic mass - Patient found to have colon mass on CT abdomen on this admission. This was confirmed by colonoscopy on 07/29/2014. Bleeding thought to be secondary to this large colon mass. - Patient is status post 4 units of PRBC transfusion during this hospitalization. - Hemoglobin remains stable at 7.7.  Active problems: Colonic mass / colon cancer - Status post colonoscopy on 07/29/2014 followed by surgical resection of the colon cancer 07/30/2014 - Status post exploratory laparotomy, extended right colectomy, biopsy of peritoneal nodules and cholecystectomy on 07/30/14. 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 Will need oncology follow up on discharge.  - Pain controlled currently with morphine PCA  Bile leak - HIDA showed bile leak.  - GI has seen the patient in consultation. No immediate plans for ERCP or stent placement as hope is that bile leak seals on its  own.   Essential hypertension  - Will resume atenolol since blood pressure this morning 174/79.  Hyperlipidemia  - We'll continue to hold statins until patient is on regular diet.  Anxiety  - Continue Ativan as needed.  Tobacco abuse  - Counseled for cessation; continue nicotine patch.  Hypokalemia  - Secondary to GI losses. Supplemented. - Potassium is within normal limits.  Leukocytosis / Acute respiratory failure with hypoxia / Probable HCAP / bronchospasm - Patient was treated with empiric Zosyn on admission given rising WBC and possibility of microperforation from colonic mass. - Antibiotics were discontinued post surgery. Significant jump in WBC noted 08/01/14, so Zosyn was resumed. Chest x-ray showed possible pneumonia. - White blood cell count is within normal limits this morning.   - We will continue Zosyn for now, patient had low-grade fever overnight and 99.21F  - Respiratory status is stable.   DVT Prophylaxis  - Heparin subcutaneous ordered   Code Status: Full.  Family Communication:  plan of care discussed with the patient Disposition Plan: will transfer out of stepdown unit today    IV access:  Peripheral IV Central line   Procedures and diagnostic studies:    Ct Abdomen Pelvis W Contrast 07/28/14: 1. Markedly abnormal hepatic flexure with wall thickening along a 10-12 cm length but extensive surrounding mesenteric nodularity, and increased regional lymph nodes. The appearance is atypical for acute colitis and highly suspicious for adenocarcinoma. GI consultation for followup colonoscopy recommended. 2. Small volume perihepatic fluid. Multiple up to 14 mm indeterminate low-density lesions in the liver. Several prominent but indeterminate retroperitoneal lymph nodes in the upper abdomen. 3. Abnormal fat stranding/nodularity continues to the gallbladder fossa, but the gallbladder does not appear  inflamed.   Colonoscopy 07/29/14: 1. Large circumferential  obstructing friable mass at the hepaticflexure; multiple biopsies performed2. Mild diverticulosis was noted in the sigmoid colon anddescending colon.  Laparoscopy converted to exploratory laparotomy, extended right colectomy, biopsy of peritoneal nodules on diaphragm with closure of diaphragm, cholecystectomy 07/30/14: Done by Dr. Zella Richer.  Dg Chest Port 1 View 08/01/2014: Increased bilateral upper lobe and right middle lobe opacities are noted most consistent with pneumonia or possibly edema. Followup radiographs are recommended.   Nm Hepatobiliary Including Gb 08/02/2014: 1. Abnormal radiotracer collection adjacent to the inferior margin of the left hepatic lobe most concerning for a bile leak.    Medical Consultants:  Dr. Lucio Edward, GI. Dr. Jackolyn Confer, Surgery  Other Consultants:  None   IAnti-Infectives:   Zosyn 07/29/14---> 07/30/14, resumed 08/01/14   Leisa Lenz, MD  [facility] Pager [phone]  If 7PM-7AM, please contact night-coverage [email or web address] Password [email or web address] 08/04/2014, 9:34 AM   LOS: 7 days    HPI/Subjective: No acute overnight events.  Objective: Filed Vitals:   08/04/14 0555 08/04/14 0600 08/04/14 0800 08/04/14 0920  BP:  174/79    Pulse:  95 78   Temp: 99 F (37.2 C)     TempSrc: Oral     Resp:  31 25   Height:      Weight: 75 kg (165 lb 5.5 oz)     SpO2:  92% 95% 96%    Intake/Output Summary (Last 24 hours) at 08/04/14 0934 Last data filed at 08/04/14 0800  Gross per 24 hour  Intake  617.5 ml  Output   2000 ml  Net -1382.5 ml    Exam:   General:  Pt is alert, follows commands appropriately, not in acute distress; has central line in place   Cardiovascular: Regular rate and rhythm, S1/S2, no murmurs  Respiratory: Clear to auscultation bilaterally, no wheezing, no crackles, no rhonchi  Abdomen: Soft, non tender, non distended, bowel sounds present; JP in place with serosanguinous drainage   Extremities: No edema, pulses  DP and PT palpable bilaterally  Neuro: Grossly nonfocal  Data Reviewed: Basic Metabolic Panel:  Recent Labs Lab 07/31/14 0535 08/01/14 0545 08/02/14 0500 08/03/14 0530 08/04/14 0510  NA 140 138 143 142 144  K 3.8 4.0 3.9 4.0 3.9  CL 109 108 111 112 112  CO2 27 26 27 25 26   GLUCOSE 154* 138* 126* 128* 121*  BUN 10 9 11 11 12   CREATININE 0.71 0.53 0.55 0.54 0.44*  CALCIUM 7.4* 7.2* 7.4* 7.5* 7.9*   Liver Function Tests:  Recent Labs Lab 08/01/14 0545 08/02/14 0500 08/03/14 0530  AST 25 21 18   ALT 23 17 17   ALKPHOS 58 60 93  BILITOT 0.6 0.7 1.4*  PROT 5.2* 5.2* 5.3*  ALBUMIN 2.2* 2.1* 1.9*   No results for input(s): LIPASE, AMYLASE in the last 168 hours. No results for input(s): AMMONIA in the last 168 hours. CBC:  Recent Labs Lab 07/31/14 0535 08/01/14 0545 08/02/14 0500 08/03/14 0530 08/04/14 0510  WBC 13.2* 21.0* 14.1* 11.3* 9.5  HGB 8.9* 8.1* 7.1* 7.7* 7.7*  HCT 28.7* 26.8* 23.8* 25.1* 24.7*  MCV 78.6 81.5 81.8 80.7 80.5  PLT 395 359 326 333 340   Cardiac Enzymes: No results for input(s): CKTOTAL, CKMB, CKMBINDEX, TROPONINI in the last 168 hours. BNP: Invalid input(s): POCBNP CBG:  Recent Labs Lab 07/30/14 2130  GLUCAP 150*    Recent Results (from the past 240 hour(s))  MRSA PCR Screening  Status: None   Collection Time: 08/03/14 11:19 PM  Result Value Ref Range Status   MRSA by PCR NEGATIVE NEGATIVE Final     Scheduled Meds: . albuterol  2.5 mg Nebulization Q6H  . heparin subcutaneous  5,000 Units Subcutaneous 3 times per day  . morphine   Intravenous 6 times per day  . pantoprazole (PROTONIX) IV  40 mg Intravenous Q24H  . piperacillin-tazobactam (ZOSYN)  IV  3.375 g Intravenous Q8H   Continuous Infusions: . dextrose 5 % and 0.9 % NaCl with KCl 20 mEq/L 100 mL/hr at 08/03/14 0544

## 2014-08-04 NOTE — Progress Notes (Signed)
5 Days Post-Op  Subjective: He looks pretty good today.  Up in chair, he isn't wheezing at all this AM.  + BS, ? Flatus, NG is out. No nausea or distension. His drainage from the JP has changed from bile colored to a serosanguinous colored clear color.    Objective: Vital signs in last 24 hours: Temp:  [98.3 F (36.8 C)-99.1 F (37.3 C)] 99 F (37.2 C) (03/02 0555) Pulse Rate:  [74-106] 95 (03/02 0600) Resp:  [18-38] 31 (03/02 0600) BP: (125-180)/(54-79) 174/79 mmHg (03/02 0600) SpO2:  [88 %-97 %] 92 % (03/02 0600) Weight:  [165 lb 5.5 oz (75 kg)] 165 lb 5.5 oz (75 kg) (03/02 0555) Last BM Date: 07/29/14 250 from drain, nothing recorded from the NG 1750 urine Afebrile, TM 99.3 RR stays up im mid 20's up to 31 this AM 0300.Sats down some to 91-92 % then, on O2. Labs OK Amylase is  9.  I will check serum also. Intake/Output from previous day: 03/01 0701 - 03/02 0700 In: 605 [I.V.:500; NG/GT:30; IV Piggyback:75] Out: 2000 [Urine:1750; Drains:250] Intake/Output this shift:    General appearance: alert, cooperative and no distress Resp: clear to auscultation bilaterally and down a little in the bases, but the best he has been since I have seen him. GI: soft, sore, up in chair, but no distension. His wound looks fine, and he has bowel sounds.  Lab Results:   Recent Labs  08/03/14 0530 08/04/14 0510  WBC 11.3* 9.5  HGB 7.7* 7.7*  HCT 25.1* 24.7*  PLT 333 340    BMET  Recent Labs  08/03/14 0530 08/04/14 0510  NA 142 144  K 4.0 3.9  CL 112 112  CO2 25 26  GLUCOSE 128* 121*  BUN 11 12  CREATININE 0.54 0.44*  CALCIUM 7.5* 7.9*   PT/INR No results for input(s): LABPROT, INR in the last 72 hours.   Recent Labs Lab 07/28/14 0830 08/01/14 0545 08/02/14 0500 08/03/14 0530  AST 16 25 21 18   ALT 10 23 17 17   ALKPHOS 78 58 60 93  BILITOT 0.3 0.6 0.7 1.4*  PROT 6.7 5.2* 5.2* 5.3*  ALBUMIN 3.4* 2.2* 2.1* 1.9*     Lipase     Component Value Date/Time   LIPASE 20 07/28/2014 0830     Studies/Results: Nm Hepatobiliary Including Gb  08/02/2014   CLINICAL DATA:  Status post cholecystectomy 07/30/2014  EXAM: NUCLEAR MEDICINE HEPATOBILIARY IMAGING  TECHNIQUE: Sequential images of the abdomen were obtained out to 60 minutes following intravenous administration of radiopharmaceutical.  RADIOPHARMACEUTICALS:  5.5 Mill[employer]ie FG-18E Choletec  COMPARISON:  None.  FINDINGS: Flow study shows uniform distribution of radioactivity in the liver. Sequential imaging of the liver and abdomen over a period of 60 minutes shows uniform uptake of the tracer in the liver.  Prior cholecystectomy. Filling of the common bile duct. Radiotracer uptake is present in the small bowel at 20 minutes. There is abnormal radiotracer collection adjacent to the inferior margin of the left hepatic lobe most concerning for a bile leak.  At the end of 1-hour, there is relatively poor clearance of activity from the liver.  IMPRESSION: 1. Abnormal radiotracer collection adjacent to the inferior margin of the left hepatic lobe most concerning for a bile leak. These results were called by telephone at the time of interpretation on 08/02/2014 at 12:01 pm to Dr. Jacquelynn Cree , who verbally acknowledged these results.   Electronically Signed   By: Kathreen Devoid   On:  08/02/2014 12:18    Medications: . albuterol  2.5 mg Nebulization Q6H  . morphine   Intravenous 6 times per day  . pantoprazole (PROTONIX) IV  40 mg Intravenous Q24H  . piperacillin-tazobactam (ZOSYN)  IV  3.375 g Intravenous Q8H    Assessment/Plan 1. Right colon tumor, peritoneal implants and right upper quadrant, right lateral abdominal wall, and on gallbladder, cholelithiasis 1A. Laparoscopy converted to exploratory laparotomy, extended right colectomy, biopsy of peritoneal nodules on diaphragm with closure of diaphragm, cholecystectomy, 07/30/14 Dr. Zella Richer. 1B. Bile leak 2. Anemia/Leukocytosis 3. Hypertension 4.  Tobacco use 39 years 5. Hx of ETOH use quit 4 years ago 6. Dyslipidemia  7. DVT prophylaxis - SCD 8. Zosyn day 5 since admission. 9. Chronic benzodiazapine use - very anxious    Plan:  Mobilize more, clear liquids, serum amylase, he is making good progress.  Add Heparin for DVT.  PT and OT ordered.    LOS: 7 days    Donald Fry 08/04/2014

## 2014-08-04 NOTE — Telephone Encounter (Signed)
Chart delivered 08/04/14

## 2014-08-04 NOTE — Progress Notes (Signed)
NG tube fell out when patient stood to urinate. Forrest Moron, NP notified. No new orders to reinsert tube received.

## 2014-08-05 ENCOUNTER — Encounter: Payer: Self-pay | Admitting: *Deleted

## 2014-08-05 MED ORDER — ALPRAZOLAM 1 MG PO TABS
1.0000 mg | ORAL_TABLET | Freq: Three times a day (TID) | ORAL | Status: DC | PRN
  Administered 2014-08-05 – 2014-08-11 (×18): 1 mg via ORAL
  Filled 2014-08-05 (×18): qty 1

## 2014-08-05 MED ORDER — ALBUTEROL SULFATE (2.5 MG/3ML) 0.083% IN NEBU
2.5000 mg | INHALATION_SOLUTION | RESPIRATORY_TRACT | Status: DC | PRN
  Filled 2014-08-05: qty 3

## 2014-08-05 MED ORDER — OXYCODONE-ACETAMINOPHEN 5-325 MG PO TABS
1.0000 | ORAL_TABLET | ORAL | Status: DC | PRN
  Administered 2014-08-09 – 2014-08-11 (×4): 2 via ORAL
  Administered 2014-08-11: 1 via ORAL
  Administered 2014-08-11 – 2014-08-13 (×4): 2 via ORAL
  Administered 2014-08-14 – 2014-08-15 (×4): 1 via ORAL
  Filled 2014-08-05 (×2): qty 2
  Filled 2014-08-05 (×2): qty 1
  Filled 2014-08-05: qty 2
  Filled 2014-08-05: qty 1
  Filled 2014-08-05 (×3): qty 2
  Filled 2014-08-05: qty 1
  Filled 2014-08-05: qty 2
  Filled 2014-08-05: qty 1
  Filled 2014-08-05: qty 2

## 2014-08-05 MED ORDER — SODIUM CHLORIDE 0.9 % IV SOLN: INTRAVENOUS | Status: DC

## 2014-08-05 MED ORDER — ALBUTEROL SULFATE (2.5 MG/3ML) 0.083% IN NEBU
2.5000 mg | INHALATION_SOLUTION | Freq: Three times a day (TID) | RESPIRATORY_TRACT | Status: DC
  Administered 2014-08-06 – 2014-08-08 (×7): 2.5 mg via RESPIRATORY_TRACT
  Filled 2014-08-05 (×7): qty 3

## 2014-08-05 NOTE — Evaluation (Signed)
Occupational Therapy Evaluation Patient Details Name: Donald Fry MRN: [id number] DOB: Oct 26, 1955 Today's Date: 08/05/2014    History of Present Illness 59 yo male s/p exp lap, colectomy, cholecystectomy 07/30/14. Hx of HTN, anemia.    Clinical Impression   Pt underwent the above surgery due to cancer of transverse coloon with hemorrhage.  He was independent/mod I with adls prior to admission (extra time) and was working.  Pt now needs min A overall.  He will benefit from skilled OT to increase safety/independence with toilet transfers and further educate on energy conservation/work on activity tolerance.  Wife will assist with adls as needed.      Follow Up Recommendations  Supervision/Assistance - 24 hour    Equipment Recommendations  3 in 1 bedside comode (possibly)    Recommendations for Other Services       Precautions / Restrictions Precautions Precautions: Fall Precaution Comments: multiple lines/leads/O2 Restrictions Weight Bearing Restrictions: No      Mobility Bed Mobility Overal bed mobility: Needs Assistance Bed Mobility: Rolling;Sidelying to Sit Rolling: Min assist Sidelying to sit: Min assist       General bed mobility comments: small amount of assist. Increased time. VCs safety, technique.   Transfers Overall transfer level: Needs assistance Equipment used: Rolling walker (2 wheeled) Transfers: Sit to/from Stand Sit to Stand: Min assist;From elevated surface         General transfer comment: assist to rise, stabilize, control descent. VCs safety, hand placement    Balance                                            ADL Overall ADL's : Needs assistance/impaired                                       General ADL Comments: Pt needs overall min A for ADLs due to lines and for sit to stand; he is able to cross legs for ADLs but will needs min A to avoid straining.  He is not interested in AE--wife will assist as  needed.  Discussed possible 3:1 commode for use over toilet to make transfers easier.  Also educated on energy conservation/balancing tasks to regain endurance     Vision     Perception     Praxis      Pertinent Vitals/Pain Pain Assessment: 0-10 Pain Score: 5  Pain Location: abdomen Pain Descriptors / Indicators: Sore Pain Intervention(s): Monitored during session;PCA encouraged;Limited activity within patient's tolerance     Hand Dominance     Extremity/Trunk Assessment Upper Extremity Assessment Upper Extremity Assessment:  (appears wfls--limited by abdominal pain)      Cervical / Trunk Assessment Cervical / Trunk Assessment: Kyphotic   Communication Communication Communication: No difficulties   Cognition Arousal/Alertness: Awake/alert Behavior During Therapy: WFL for tasks assessed/performed Overall Cognitive Status: Within Functional Limits for tasks assessed                     General Comments       Exercises       Shoulder Instructions      Home Living Family/patient expects to be discharged to:: Private residence Living Arrangements: Spouse/significant other Available Help at Discharge: Family Type of Home: House       Home Layout: Two level;1/2  bath on main level         Bathroom Toilet: Standard     Home Equipment: None   Additional Comments: pt plans to put a bed on first floor and use 1/2 bath      Prior Functioning/Environment Level of Independence: Independent        Comments: was SOB last week prior to admission    OT Diagnosis: Generalized weakness   OT Problem List: Decreased strength;Decreased activity tolerance;Decreased knowledge of use of DME or AE;Pain   OT Treatment/Interventions: Self-care/ADL training;DME and/or AE instruction;Patient/family education;Balance training;Energy conservation    OT Goals(Current goals can be found in the care plan section) Acute Rehab OT Goals Patient Stated Goal: regain  independence. less pain OT Goal Formulation: With patient Time For Goal Achievement: 08/12/14 Potential to Achieve Goals: Good ADL Goals Pt Will Transfer to Toilet: with modified independence;ambulating;bedside commode Additional ADL Goal #1: Pt will perform bed mobility at mod I level in preparation for ADLs/toilet transfers Additional ADL Goal #2: Pt will verbalize 3 energy conservation techniques  OT Frequency: Min 2X/week   Barriers to D/C:            Co-evaluation PT/OT/SLP Co-Evaluation/Treatment: Yes Reason for Co-Treatment: For patient/therapist safety PT goals addressed during session: Mobility/safety with mobility OT goals addressed during session: ADL's and self-care      End of Session    Activity Tolerance: Patient tolerated treatment well Patient left: in bed;with call bell/phone within reach;with family/visitor present   Time: 0160-1093 OT Time Calculation (min): 23 min Charges:  OT General Charges $OT Visit: 1 Procedure OT Evaluation $Initial OT Evaluation Tier I: 1 Procedure G-Codes:    Gianny Sabino 08-18-2014, 3:29 PM  Lesle Chris, OTR/L [phone] 2014-08-18

## 2014-08-05 NOTE — Progress Notes (Addendum)
Patient ID: Donald Fry, male   DOB: [birth year], 59 y.o.   MRN: [id number] [facility] PROGRESS NOTE  [name] DOB: Nov 19, 1955 DOA: 07/28/2014 PCP: [name], MD  Brief narrative:    59 y.o. male with a PMH of hyperlipidemia, anemia, hypertension and tobacco abuse who was admitted 07/28/14 with right upper quadrant abdominal pain for couple of days prior to this admission. Patient also reported having ongoing dyspnea on exertion for several months prior to the admission and unintentional weight loss. Evaluation on the admission included CT abdomen which showed a possible colon cancer near the hepatic flexure with surrounding lymphadenopathy. Patient underwent colonoscopy 07/29/2014 followed by surgical resection of the colon cancer 07/30/2014. Postoperative course has been complicated with bile leak and healthcare associated pneumonia.  Assessment/Plan:    Principal problem: Chronic lower GI bleed / chronic blood loss anemia secondary to bleeding colonic mass - Patient found to have colon mass on CT abdomen on this admission. Colon mass confirmed by colonoscopy on 07/29/2014. Bleeding thought to be secondary to this large colon mass. - No further reports of bleeding. - During this hospitalization, patient has received 4 units of PRBC blood transfusion.  - Check CBC in the morning. Hemoglobin is 7.7 on 08/04/2014.   Active problems: Colonic mass / colon cancer - Status post colonoscopy on 07/29/2014 followed by exploratory laparotomy, extended right colectomy, biopsy of peritoneal nodules and cholecystectomy on 07/30/14. 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 Oncology nurse seen the patient at bedside. Patient knows what to follow with on discharge. - Pain controlled currently with morphine PCA. Per surgery we'll stop morphine PCA today and transition to by mouth pain meds.  Bile leak - HIDA showed bile leak.  -  GI has seen the patient in consultation.  - There are no immediate plans for ERCP or stent placement as hope is that bile leak will seal on its own.  Essential hypertension  - Continue atenolol 100 mg daily  Hyperlipidemia  - Statin therapy still on hold until by mouth intake improves.  Anxiety  - Resume Xanax.  Tobacco abuse  - Counseled for cessation; continue nicotine patch.  Hypokalemia  - Secondary to GI losses. Supplemented.  Leukocytosis / Acute respiratory failure with hypoxia / Probable HCAP / bronchospasm - Patient was treated with empiric Zosyn on admission given rising WBC and possibility of microperforation from colonic mass. - Antibiotics were discontinued post surgery. Significant jump in WBC was noted 08/01/14, so Zosyn was resumed. Chest x-ray 2/28 showed possible pneumonia. - White blood cell count is now normalized. Patient is still on Zosyn. We'll continue this antibiotic for now.   DVT Prophylaxis  - Heparin subcutaneous ordered while patient is in hospital.   Code Status: Full.  Family Communication:  plan of care discussed with the patient Disposition Plan: Plan for today is to discontinue PCA morphine and transition to by mouth pain meds, work with physical therapy, ambulate.  IV access:  Peripheral IV Central line   Procedures and diagnostic studies:    Ct Abdomen Pelvis W Contrast 07/28/14: 1. Markedly abnormal hepatic flexure with wall thickening along a 10-12 cm length but extensive surrounding mesenteric nodularity, and increased regional lymph nodes. The appearance is atypical for acute colitis and highly suspicious for adenocarcinoma. GI consultation for followup colonoscopy recommended. 2. Small volume perihepatic fluid. Multiple up to 14 mm indeterminate low-density lesions in the liver. Several prominent but indeterminate retroperitoneal lymph nodes in the  upper abdomen. 3. Abnormal fat stranding/nodularity continues to the gallbladder fossa,  but the gallbladder does not appear inflamed.   Colonoscopy 07/29/14: 1. Large circumferential obstructing friable mass at the hepaticflexure; multiple biopsies performed2. Mild diverticulosis was noted in the sigmoid colon anddescending colon.  Laparoscopy converted to exploratory laparotomy, extended right colectomy, biopsy of peritoneal nodules on diaphragm with closure of diaphragm, cholecystectomy 07/30/14: Done by Dr. Zella Richer.  Dg Chest Port 1 View 08/01/2014: Increased bilateral upper lobe and right middle lobe opacities are noted most consistent with pneumonia or possibly edema. Followup radiographs are recommended.   Nm Hepatobiliary Including Gb 08/02/2014: 1. Abnormal radiotracer collection adjacent to the inferior margin of the left hepatic lobe most concerning for a bile leak.    Medical Consultants:  Dr. Lucio Edward, GI. Dr. Jackolyn Confer, Surgery  Other Consultants:  None   IAnti-Infectives:   Zosyn 07/29/14---> 07/30/14, resumed 08/01/14    Leisa Lenz, MD  [facility] Pager [phone]  If 7PM-7AM, please contact night-coverage [email or web address] Password [facility] 08/05/2014, 10:38 AM   LOS: 8 days    HPI/Subjective: No acute overnight events.  Objective: Filed Vitals:   08/05/14 0759 08/05/14 0800 08/05/14 1010 08/05/14 1016  BP:    114/61  Pulse:    64  Temp:      TempSrc:      Resp:  24 22   Height:      Weight:      SpO2: 95% 95% 95%     Intake/Output Summary (Last 24 hours) at 08/05/14 1038 Last data filed at 08/05/14 1000  Gross per 24 hour  Intake   2250 ml  Output   1225 ml  Net   1025 ml    Exam:   General:  Pt is alert and awake, no distress  Cardiovascular: Regular rate and rhythm, S1/S2 appreciated  Respiratory: No wheezing, no rhonchi  Abdomen: Soft, non tender, JP drain in place with serosanguineous fluid drained  Extremities: No leg swelling, pulses palpable  Neuro: No focal deficits  Data Reviewed: Basic  Metabolic Panel:  Recent Labs Lab 07/31/14 0535 08/01/14 0545 08/02/14 0500 08/03/14 0530 08/04/14 0510  NA 140 138 143 142 144  K 3.8 4.0 3.9 4.0 3.9  CL 109 108 111 112 112  CO2 27 26 27 25 26   GLUCOSE 154* 138* 126* 128* 121*  BUN 10 9 11 11 12   CREATININE 0.71 0.53 0.55 0.54 0.44*  CALCIUM 7.4* 7.2* 7.4* 7.5* 7.9*   Liver Function Tests:  Recent Labs Lab 08/01/14 0545 08/02/14 0500 08/03/14 0530  AST 25 21 18   ALT 23 17 17   ALKPHOS 58 60 93  BILITOT 0.6 0.7 1.4*  PROT 5.2* 5.2* 5.3*  ALBUMIN 2.2* 2.1* 1.9*    Recent Labs Lab 08/04/14 0510  AMYLASE 26   No results for input(s): AMMONIA in the last 168 hours. CBC:  Recent Labs Lab 07/31/14 0535 08/01/14 0545 08/02/14 0500 08/03/14 0530 08/04/14 0510  WBC 13.2* 21.0* 14.1* 11.3* 9.5  HGB 8.9* 8.1* 7.1* 7.7* 7.7*  HCT 28.7* 26.8* 23.8* 25.1* 24.7*  MCV 78.6 81.5 81.8 80.7 80.5  PLT 395 359 326 333 340   Cardiac Enzymes: No results for input(s): CKTOTAL, CKMB, CKMBINDEX, TROPONINI in the last 168 hours. BNP: Invalid input(s): POCBNP CBG:  Recent Labs Lab 07/30/14 2130  GLUCAP 150*    Recent Results (from the past 240 hour(s))  MRSA PCR Screening     Status: None   Collection Time: 08/03/14 11:19 PM  Result Value Ref Range Status   MRSA by PCR NEGATIVE NEGATIVE Final    Comment:        The GeneXpert MRSA Assay (FDA approved for NASAL specimens only), is one component of a comprehensive MRSA colonization surveillance program. It is not intended to diagnose MRSA infection nor to guide or monitor treatment for MRSA infections.      Scheduled Meds: . albuterol  2.5 mg Nebulization Q6H  . atenolol  100 mg Oral Daily  . heparin subcutaneous  5,000 Units Subcutaneous 3 times per day  . morphine   Intravenous 6 times per day  . pantoprazole (PROTONIX) IV  40 mg Intravenous Q24H  . piperacillin-tazobactam (ZOSYN)  IV  3.375 g Intravenous Q8H   Continuous Infusions: . dextrose 5 % and  0.9 % NaCl with KCl 20 mEq/L 100 mL/hr at 08/05/14 0604

## 2014-08-05 NOTE — Progress Notes (Signed)
JP drainage has increase and is bile colored again. Plan ERCP tomorrow for Rx of a presumed biliary leak

## 2014-08-05 NOTE — Progress Notes (Signed)
6 Days Post-Op  Subjective: He actually looks pretty good.  He has had some flatus, hasn't walked outside the room.  It sounds like he's a bit anxious to do that.  He is doing OK with clear liquids.   Drain is serosanguinous I emptied 100 from it this AM.   He is still concerned about his Xanax. Objective: Vital signs in last 24 hours: Temp:  [98.8 F (37.1 C)-100.1 F (37.8 C)] 98.8 F (37.1 C) (03/03 0525) Pulse Rate:  [66-86] 71 (03/03 0525) Resp:  [18-26] 24 (03/03 0800) BP: (127-144)/(64-88) 127/64 mmHg (03/03 0525) SpO2:  [93 %-98 %] 95 % (03/03 0800) Last BM Date: 07/29/14 220 PO Drains 355 Tm 100.1, other VS stable. Serum amylase yesterday was 26/peritoneal fluid 9 No labs today Intake/Output from previous day: 03/02 0701 - 03/03 0700 In: 2682.5 [P.O.:220; I.V.:2300; IV Piggyback:162.5] Out: 8416 [Urine:1100; Drains:355] Intake/Output this shift:    General appearance: alert, cooperative and no distress Resp: clear to auscultation bilaterally and Not wheezing this AM either. GI: soft sore, few BS, some flatus, no BM, still a little distended.  Lab Results:   Recent Labs  08/03/14 0530 08/04/14 0510  WBC 11.3* 9.5  HGB 7.7* 7.7*  HCT 25.1* 24.7*  PLT 333 340    BMET  Recent Labs  08/03/14 0530 08/04/14 0510  NA 142 144  K 4.0 3.9  CL 112 112  CO2 25 26  GLUCOSE 128* 121*  BUN 11 12  CREATININE 0.54 0.44*  CALCIUM 7.5* 7.9*   PT/INR No results for input(s): LABPROT, INR in the last 72 hours.   Recent Labs Lab 08/01/14 0545 08/02/14 0500 08/03/14 0530  AST 25 21 18   ALT 23 17 17   ALKPHOS 58 60 93  BILITOT 0.6 0.7 1.4*  PROT 5.2* 5.2* 5.3*  ALBUMIN 2.2* 2.1* 1.9*     Lipase     Component Value Date/Time   LIPASE 20 07/28/2014 0830     Studies/Results: No results found.  Medications: . albuterol  2.5 mg Nebulization Q6H  . atenolol  100 mg Oral Daily  . heparin subcutaneous  5,000 Units Subcutaneous 3 times per day  .  morphine   Intravenous 6 times per day  . pantoprazole (PROTONIX) IV  40 mg Intravenous Q24H  . piperacillin-tazobactam (ZOSYN)  IV  3.375 g Intravenous Q8H    Assessment/Plan 1. Right colon tumor, peritoneal implants and right upper quadrant, right lateral abdominal wall, and on gallbladder, cholelithiasis 1A. Laparoscopy converted to exploratory laparotomy, extended right colectomy, biopsy of peritoneal nodules on diaphragm with closure of diaphragm, cholecystectomy, 07/30/14 Dr. Zella Richer. 1B. Bile leak 2. Anemia/Leukocytosis 3. Hypertension 4. Tobacco use 39 years 5. Hx of ETOH use quit 4 years ago 6. Dyslipidemia  7. DVT prophylaxis - SCD/heparin 8. Zosyn day 5 since admission. 9. Chronic benzodiazapine use - very anxious    Plan:  Mobilize, I told him I wanted him to walk the halls 4 times today.  I am going to continue clears.  I will ask PT to see and help him, he ask about a walker.  Start PO pain medicine and d/c PCA tomorrow. We can go back to his PO Xanax also if Dr. Charlies Silvers would like to resume that and stop the IV.      LOS: 8 days    [place] 08/05/2014

## 2014-08-05 NOTE — Evaluation (Signed)
Physical Therapy Evaluation Patient Details Name: Donald Fry MRN: [id number] DOB: 11/27/1955 Today's Date: 08/05/2014   History of Present Illness  59 yo male s/p exp lap, colectomy, cholecystectomy 07/30/14. Hx of HTN, anemia.   Clinical Impression  On eval, pt required Min assist for mobility-able to ambulate ~150 feet with RW, O2. Pt tolerated activity well. Pain rated 5/10 with activity. Encouraged pt to walk in hallways with nursing in addition to working with PT/OT (spoke with RN as well-wife states pt will not ask to walk so nursing may have to offer it to him). Recommend HHPT. Will continue to assess need for RW.     Follow Up Recommendations Home health PT    Equipment Recommendations  Rolling walker with 5" wheels (possibly will continue to assess)    Recommendations for Other Services OT consult     Precautions / Restrictions Precautions Precautions: Fall Precaution Comments: multiple lines/leads/O2 Restrictions Weight Bearing Restrictions: No      Mobility  Bed Mobility Overal bed mobility: Needs Assistance Bed Mobility: Rolling;Sidelying to Sit Rolling: Min assist Sidelying to sit: Min assist       General bed mobility comments: small amount of assist. Increased time. VCs safety, technique.   Transfers Overall transfer level: Needs assistance Equipment used: Rolling walker (2 wheeled) Transfers: Sit to/from Stand Sit to Stand: Min assist;From elevated surface         General transfer comment: assist to rise, stabilize, control descent. VCs safety, hand placement  Ambulation/Gait Ambulation/Gait assistance: Min assist Ambulation Distance (Feet): 150 Feet Assistive device: Rolling walker (2 wheeled) Gait Pattern/deviations: Step-through pattern;Trunk flexed;Decreased stride length     General Gait Details: slow gait speed. Noted increased RR with mobilization. Remained on [place] O2-pt denied dyspnea. Tolerated distance well but fatigued once back in  room.   Stairs            Wheelchair Mobility    Modified Rankin (Stroke Patients Only)       Balance                                             Pertinent Vitals/Pain Pain Assessment: 0-10 Pain Score: 5  Pain Location: abdomen Pain Descriptors / Indicators: Sore Pain Intervention(s): Monitored during session;PCA encouraged;Repositioned    Home Living Family/patient expects to be discharged to:: Private residence Living Arrangements: Spouse/significant other Available Help at Discharge: Family Type of Home: House         Home Equipment: None      Prior Function Level of Independence: Independent               Hand Dominance        Extremity/Trunk Assessment   Upper Extremity Assessment: Defer to OT evaluation           Lower Extremity Assessment: Generalized weakness      Cervical / Trunk Assessment: Kyphotic  Communication   Communication: No difficulties  Cognition Arousal/Alertness: Awake/alert Behavior During Therapy: WFL for tasks assessed/performed Overall Cognitive Status: Within Functional Limits for tasks assessed                      General Comments      Exercises        Assessment/Plan    PT Assessment Patient needs continued PT services  PT Diagnosis Difficulty walking;Acute pain;Generalized weakness   PT Problem  List Decreased strength;Decreased activity tolerance;Decreased balance;Decreased mobility;Decreased knowledge of use of DME;Pain  PT Treatment Interventions DME instruction;Gait training;Functional mobility training;Therapeutic activities;Therapeutic exercise;Patient/family education;Balance training   PT Goals (Current goals can be found in the Care Plan section) Acute Rehab PT Goals Patient Stated Goal: regain independence. less pain PT Goal Formulation: With patient/family Time For Goal Achievement: 08/19/14 Potential to Achieve Goals: Good    Frequency Min 3X/week    Barriers to discharge        Co-evaluation               End of Session Equipment Utilized During Treatment: Oxygen Activity Tolerance: Patient tolerated treatment well Patient left: in bed;with call bell/phone within reach;with family/visitor present           Time: 1420-1441 PT Time Calculation (min) (ACUTE ONLY): 21 min   Charges:   PT Evaluation $Initial PT Evaluation Tier I: 1 Procedure     PT G Codes:        Weston Anna, MPT Pager: [phone]

## 2014-08-05 NOTE — [facility] Oncology Navigator Note (Signed)
Met with patient and his sister-in-law and brother-in-law in hospital room. Introduced myself and explained the role of the GI Art therapist. Provided Dr. Gearldine Shown and my contact information and appointment calendar. Left NCI booklet What You Need to Know About Cancer of the Colon and Rectum with him. Encouraged him to write any questions he may have down and bring them to the appointment next week. Stressed that his #1 job now is to eat, push fluids, move his bowels and get more active to build up his strength. He tells RN he is married, and his wife is home. He has step-children, with one being 37 years old. Thinks the teen is handling everything OK. He expresses concern and fear of the diagnosis and RN validated this is normal when faced with a cancer diagnosis. He could not identify any barriers to his care at this time.  Merceda Elks, RN GI Oncology Nurse Naviagator [facility]

## 2014-08-06 ENCOUNTER — Inpatient Hospital Stay (HOSPITAL_COMMUNITY): Payer: Medicaid Other

## 2014-08-06 ENCOUNTER — Inpatient Hospital Stay (HOSPITAL_COMMUNITY): Payer: Medicaid Other | Admitting: Anesthesiology

## 2014-08-06 ENCOUNTER — Encounter (HOSPITAL_COMMUNITY)
Admission: EM | Disposition: A | Discharge status: Home Health | Payer: Self-pay | Source: Home / Self Care | Attending: Internal Medicine

## 2014-08-06 ENCOUNTER — Encounter (HOSPITAL_COMMUNITY): Payer: Self-pay | Admitting: Gastroenterology

## 2014-08-06 HISTORY — PX: ERCP: SHX5425

## 2014-08-06 SURGERY — ERCP, WITH INTERVENTION IF INDICATED
Anesthesia: General

## 2014-08-06 MED ORDER — LACTATED RINGERS IV SOLN
INTRAVENOUS | Status: DC
  Administered 2014-08-06: 1000 mL via INTRAVENOUS

## 2014-08-06 MED ORDER — MIDAZOLAM HCL 2 MG/2ML IJ SOLN
INTRAMUSCULAR | Status: AC
  Filled 2014-08-06: qty 2

## 2014-08-06 MED ORDER — FENTANYL CITRATE 0.05 MG/ML IJ SOLN
INTRAMUSCULAR | Status: AC
  Filled 2014-08-06: qty 5

## 2014-08-06 MED ORDER — LIDOCAINE HCL (CARDIAC) 20 MG/ML IV SOLN
INTRAVENOUS | Status: DC | PRN
  Administered 2014-08-06: 50 mg via INTRAVENOUS

## 2014-08-06 MED ORDER — MEPERIDINE HCL 100 MG/ML IJ SOLN: 6.2500 mg | INTRAMUSCULAR | Status: DC | PRN

## 2014-08-06 MED ORDER — SODIUM CHLORIDE 0.9 % IV SOLN
INTRAVENOUS | Status: DC | PRN
  Administered 2014-08-06: 28 mL

## 2014-08-06 MED ORDER — LIDOCAINE HCL (CARDIAC) 20 MG/ML IV SOLN
INTRAVENOUS | Status: AC
  Filled 2014-08-06: qty 5

## 2014-08-06 MED ORDER — PHENYLEPHRINE HCL 10 MG/ML IJ SOLN
INTRAMUSCULAR | Status: DC | PRN
  Administered 2014-08-06 (×3): 40 ug via INTRAVENOUS

## 2014-08-06 MED ORDER — PROMETHAZINE HCL 25 MG/ML IJ SOLN: 6.2500 mg | INTRAMUSCULAR | Status: DC | PRN

## 2014-08-06 MED ORDER — PROPOFOL 10 MG/ML IV BOLUS
INTRAVENOUS | Status: DC | PRN
  Administered 2014-08-06: 160 mg via INTRAVENOUS

## 2014-08-06 MED ORDER — PROPOFOL 10 MG/ML IV BOLUS
INTRAVENOUS | Status: AC
  Filled 2014-08-06: qty 20

## 2014-08-06 MED ORDER — FENTANYL CITRATE 0.05 MG/ML IJ SOLN
INTRAMUSCULAR | Status: DC | PRN
  Administered 2014-08-06: 25 ug via INTRAVENOUS
  Administered 2014-08-06: 50 ug via INTRAVENOUS

## 2014-08-06 MED ORDER — LACTATED RINGERS IV SOLN: INTRAVENOUS | Status: DC

## 2014-08-06 MED ORDER — LACTATED RINGERS IV SOLN
INTRAVENOUS | Status: DC | PRN
  Administered 2014-08-06 (×2): via INTRAVENOUS

## 2014-08-06 MED ORDER — GLUCAGON HCL RDNA (DIAGNOSTIC) 1 MG IJ SOLR
INTRAMUSCULAR | Status: DC | PRN
  Administered 2014-08-06: 1 mg via INTRAVENOUS

## 2014-08-06 MED ORDER — MIDAZOLAM HCL 5 MG/5ML IJ SOLN
INTRAMUSCULAR | Status: DC | PRN
  Administered 2014-08-06 (×2): 1 mg via INTRAVENOUS

## 2014-08-06 MED ORDER — ONDANSETRON HCL 4 MG/2ML IJ SOLN
INTRAMUSCULAR | Status: DC | PRN
  Administered 2014-08-06: 4 mg via INTRAVENOUS

## 2014-08-06 MED ORDER — INDOMETHACIN 50 MG RE SUPP
100.0000 mg | Freq: Once | RECTAL | Status: AC
  Administered 2014-08-06: 100 mg via RECTAL
  Filled 2014-08-06: qty 2

## 2014-08-06 MED ORDER — PHENYLEPHRINE 40 MCG/ML (10ML) SYRINGE FOR IV PUSH (FOR BLOOD PRESSURE SUPPORT)
PREFILLED_SYRINGE | INTRAVENOUS | Status: AC
  Filled 2014-08-06: qty 10

## 2014-08-06 MED ORDER — SUCCINYLCHOLINE CHLORIDE 20 MG/ML IJ SOLN
INTRAMUSCULAR | Status: DC | PRN
  Administered 2014-08-06: 100 mg via INTRAVENOUS

## 2014-08-06 MED ORDER — ROCURONIUM BROMIDE 100 MG/10ML IV SOLN
INTRAVENOUS | Status: DC | PRN
  Administered 2014-08-06: 15 mg via INTRAVENOUS

## 2014-08-06 NOTE — Progress Notes (Signed)
PT Cancellation Note  Patient Details Name: Donald Fry MRN: [id number] DOB: [birth year]   Cancelled Treatment:    Reason Eval/Treat Not Completed: Patient at procedure or test/unavailable--pt having procedure. Will check back another day.    Weston Anna [facility] 08/06/2014, 12:56 PM

## 2014-08-06 NOTE — Anesthesia Postprocedure Evaluation (Signed)
  Anesthesia Post-op Note  Patient: Donald Fry  Procedure(s) Performed: Procedure(s) (LRB): ENDOSCOPIC RETROGRADE CHOLANGIOPANCREATOGRAPHY (ERCP) (N/A)  Patient Location: PACU  Anesthesia Type: General  Level of Consciousness: awake and alert   Airway and Oxygen Therapy: Patient Spontanous Breathing  Post-op Pain: mild  Post-op Assessment: Post-op Vital signs reviewed, Patient's Cardiovascular Status Stable, Respiratory Function Stable, Patent Airway and No signs of Nausea or vomiting  Last Vitals:  Filed Vitals:   08/06/14 1419  BP: 112/63  Pulse: 51  Temp: 36.6 C  Resp: 18    Post-op Vital Signs: stable   Complications: No apparent anesthesia complications

## 2014-08-06 NOTE — Progress Notes (Signed)
At ERCP there is no obvious leak from the biliary system.  Because of a positive HIDA scan and recent bilious drainage I went ahead and placed a plastic biliary stent.

## 2014-08-06 NOTE — H&P (View-Only) (Signed)
JP drainage has increase and is bile colored again. Plan ERCP tomorrow for Rx of a presumed biliary leak

## 2014-08-06 NOTE — Progress Notes (Signed)
ANTIBIOTIC CONSULT NOTE - Follow up  Pharmacy Consult for Zosyn Indication: intra-abdominal infection vs pneumonia  No Known Allergies  Patient Measurements: Height: 5\' 9"  (175.3 cm) Weight: 163 lb (73.936 kg) IBW/kg (Calculated) : 70.7  Vital Signs: Temp: 98.4 F (36.9 C) (03/04 1055) Temp Source: Oral (03/04 1055) BP: 126/64 mmHg (03/04 1230) Pulse Rate: 59 (03/04 1235) Intake/Output from previous day: 03/03 0701 - 03/04 0700 In: 3299 [I.V.:1600; IV Piggyback:100] Out: 1285 [Urine:800; Drains:485] Intake/Output from this shift: Total I/O In: 1190 [I.V.:1100; Other:90] Out: 700 [Urine:400; Drains:300]  Labs:  Recent Labs  08/04/14 0510  WBC 9.5  HGB 7.7*  PLT 340  CREATININE 0.44*   Estimated Creatinine Clearance: 98.2 mL/min (by C-G formula based on Cr of 0.44).  Medical History: Past Medical History  Diagnosis Date  . Hypertension   . High cholesterol   . Anxiety   . Colon cancer 07/30/2014    Medications:  Scheduled:  . albuterol  2.5 mg Nebulization TID  . atenolol  100 mg Oral Daily  . heparin subcutaneous  5,000 Units Subcutaneous 3 times per day  . morphine   Intravenous 6 times per day  . pantoprazole (PROTONIX) IV  40 mg Intravenous Q24H  . piperacillin-tazobactam (ZOSYN)  IV  3.375 g Intravenous Q8H   Infusions:  . sodium chloride    . dextrose 5 % and 0.9 % NaCl with KCl 20 mEq/L 100 mL/hr at 08/05/14 0604  . lactated ringers 1,000 mL (08/06/14 0854)  . lactated ringers    . lactated ringers     Assessment: 10 yoF admitted 2/24 for evaluation of persistent R flank pain and anemia. Pt was seen in ED 2/21 for bronchitis and RLQ pain, found to have cholelithiasis without evidence of cholecystitis. Colonoscopy performed 2/25 revealed large circumferential obstructing bleeding friable mass at the hepatic flexure. Pt is s/p exploratory laparotomy, extended right colectomy, biopsy of peritoneal nodules and cholecystectomy on 07/30/14.   Pt was  originally started on Zosyn per Pharmacy on 2/25 for intra-abdominal infection, which was discontinued post-surgery on 2/26. Pt now with new leukocytosis, bilateral opacities on chest XRay and Pharmacy is asked to restart Zosyn for pneumonia vs intra-abdominal infection.  Antiinfectives:  2/27 >> Cefotetan x1 2/26 >> Zosyn >> 2/26 2/28 >> Zosyn restart >>  Labs / vitals: Tmax: AF WBCs: improved to WNL(3/2) Renal: SCr 0.44(3/2), CrCl 98CG,   No microbiologic data available  Goal of Therapy:  Zosyn per indication and renal function  Plan:  Cont Zosyn 3.375g IV q8h, each dose to be infused over 4 hours Follow-up clinical course, culture results, renal function Follow-up antibiotic de-escalation and length of therapy  Romeo Rabon, PharmD, pager [phone]. 08/06/2014,1:24 PM.

## 2014-08-06 NOTE — Progress Notes (Signed)
Patient ID: Donald Fry, male   DOB: 1956/02/16, 59 y.o.   MRN: [id number] [facility] PROGRESS NOTE  MARQUELL SAENZ HDQ:[id number] DOB: November 25, 1955 DOA: 07/28/2014 PCP: [name], MD  Brief narrative:    59 y.o. male with a PMH of hyperlipidemia, anemia, hypertension and tobacco abuse who was admitted 07/28/14 with right upper quadrant abdominal pain for couple of days prior to this admission. Patient also reported having ongoing dyspnea on exertion for several months prior to the admission and unintentional weight loss. Evaluation on the admission included CT abdomen which showed a possible colon cancer near the hepatic flexure with surrounding lymphadenopathy. Patient underwent colonoscopy 07/29/2014 followed by surgical resection of the colon cancer 07/30/2014. Postoperative course has been complicated with bile leak and healthcare associated pneumonia. Additionally, JP this am with bile color and has increased in amount so plan is for ERCP today.  Assessment/Plan:     Principal problem: Chronic lower GI bleed / chronic blood loss anemia secondary to bleeding colonic mass - Patient found to have colon mass on CT abdomen on this admission.  - Colon mass confirmed by colonoscopy on 07/29/2014. Bleeding thought to be secondary to this large colon mass. - During this hospitalization, patient has received 4 units of PRBC blood transfusion. He reports of bleeding since. - Hemoglobin 7.7 on 08/04/2014. Check CBC tomorrow am.   Active problems: Colonic mass / colon cancer - Status post colonoscopy on 07/29/2014 followed by exploratory laparotomy, extended right colectomy, biopsy of peritoneal nodules and cholecystectomy on 07/30/14. 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 Patient will follow-up with oncology per scheduled appointment on discharge.  Bile leak - HIDA showed bile leak. JP drain this morning with bile color and increased in  the amount of drainage. - Plan for ERCP this am.  Essential hypertension  - BP at goal, continue atenolol.  Hyperlipidemia  - Statin on hold until PO intake improves.  Anxiety  - Continue Xanax.  Tobacco abuse  - Continue nicotine patch.  Hypokalemia  - Secondary to GI losses. Supplemented. - Check BMP in am.  Leukocytosis / Acute respiratory failure with hypoxia / Probable HCAP / bronchospasm - Patient on empiric Zosyn on admission given rising WBC and possibility of microperforation from colonic mass. - Antibiotics were discontinued post surgery but because of significant jump in WBC 08/01/14 Zosyn was resumed. Chest x-ray 2/28 showed possible pneumonia. - We will continue Zosyn for additional 24 hours. This would complete 7 days of treatment with Zosyn after it was resumed on 08/01/2014.   DVT Prophylaxis  - Heparin subcutaneous ordered.   Code Status: Full.  Family Communication:  Family not at the bedside  Disposition Plan: needs ERCP today    IV access:  Peripheral IV Central line   Procedures and diagnostic studies:    Ct Abdomen Pelvis W Contrast 07/28/14: 1. Markedly abnormal hepatic flexure with wall thickening along a 10-12 cm length but extensive surrounding mesenteric nodularity, and increased regional lymph nodes. The appearance is atypical for acute colitis and highly suspicious for adenocarcinoma. GI consultation for followup colonoscopy recommended. 2. Small volume perihepatic fluid. Multiple up to 14 mm indeterminate low-density lesions in the liver. Several prominent but indeterminate retroperitoneal lymph nodes in the upper abdomen. 3. Abnormal fat stranding/nodularity continues to the gallbladder fossa, but the gallbladder does not appear inflamed.   Colonoscopy 07/29/14: 1. Large circumferential obstructing friable mass at the hepaticflexure; multiple biopsies performed2. Mild diverticulosis was noted in the  sigmoid colon anddescending  colon.  Laparoscopy converted to exploratory laparotomy, extended right colectomy, biopsy of peritoneal nodules on diaphragm with closure of diaphragm, cholecystectomy 07/30/14: Done by Dr. Zella Richer.  Dg Chest Port 1 View 08/01/2014: Increased bilateral upper lobe and right middle lobe opacities are noted most consistent with pneumonia or possibly edema. Followup radiographs are recommended.   Nm Hepatobiliary Including Gb 08/02/2014: 1. Abnormal radiotracer collection adjacent to the inferior margin of the left hepatic lobe most concerning for a bile leak.   ERCP 08/06/2014   Medical Consultants:  Dr. Lucio Edward, GI. Dr. Jackolyn Confer, Surgery  Other Consultants:  None   IAnti-Infectives:   Zosyn 07/29/14---> 07/30/14, resumed 08/01/14     Leisa Lenz, MD  [facility] Pager [phone]  If 7PM-7AM, please contact night-coverage [email or web address] Password [facility] 08/06/2014, 11:37 AM   LOS: 9 days    HPI/Subjective: No acute overnight events.  Objective: Filed Vitals:   08/06/14 0835 08/06/14 1055 08/06/14 1100 08/06/14 1105  BP: 123/65 97/50 114/55   Pulse: 66 67 66 65  Temp: 98.2 F (36.8 C) 98.4 F (36.9 C)    TempSrc: Oral Oral    Resp: 14 23 25 26   Height: 5\' 9"  (1.753 m)     Weight: 73.936 kg (163 lb)     SpO2: 92% 92% 97% 97%    Intake/Output Summary (Last 24 hours) at 08/06/14 1137 Last data filed at 08/06/14 1055  Gross per 24 hour  Intake   2965 ml  Output   1385 ml  Net   1580 ml    Exam:   General:  Pt is not in acute distress  Cardiovascular: Regular rate and rhythm, S1/S2, no murmurs  Respiratory: Clear to auscultation bilaterally, no wheezing, no crackles, no rhonchi  Abdomen: Soft, non tender, non distended, bowel sounds present; JP with bile (+)  Extremities: No edema, pulses DP and PT palpable bilaterally  Neuro: Grossly nonfocal  Data Reviewed: Basic Metabolic Panel:  Recent Labs Lab 07/31/14 0535 08/01/14 0545  08/02/14 0500 08/03/14 0530 08/04/14 0510  NA 140 138 143 142 144  K 3.8 4.0 3.9 4.0 3.9  CL 109 108 111 112 112  CO2 27 26 27 25 26   GLUCOSE 154* 138* 126* 128* 121*  BUN 10 9 11 11 12   CREATININE 0.71 0.53 0.55 0.54 0.44*  CALCIUM 7.4* 7.2* 7.4* 7.5* 7.9*   Liver Function Tests:  Recent Labs Lab 08/01/14 0545 08/02/14 0500 08/03/14 0530  AST 25 21 18   ALT 23 17 17   ALKPHOS 58 60 93  BILITOT 0.6 0.7 1.4*  PROT 5.2* 5.2* 5.3*  ALBUMIN 2.2* 2.1* 1.9*    Recent Labs Lab 08/04/14 0510  AMYLASE 26   No results for input(s): AMMONIA in the last 168 hours. CBC:  Recent Labs Lab 07/31/14 0535 08/01/14 0545 08/02/14 0500 08/03/14 0530 08/04/14 0510  WBC 13.2* 21.0* 14.1* 11.3* 9.5  HGB 8.9* 8.1* 7.1* 7.7* 7.7*  HCT 28.7* 26.8* 23.8* 25.1* 24.7*  MCV 78.6 81.5 81.8 80.7 80.5  PLT 395 359 326 333 340   Cardiac Enzymes: No results for input(s): CKTOTAL, CKMB, CKMBINDEX, TROPONINI in the last 168 hours. BNP: Invalid input(s): POCBNP CBG:  Recent Labs Lab 07/30/14 2130  GLUCAP 150*    MRSA PCR Screening     Status: None   Collection Time: 08/03/14 11:19 PM  Result Value Ref Range Status   MRSA by PCR NEGATIVE NEGATIVE Final     Scheduled Meds: . [facility] Hold]  albuterol  2.5 mg Nebulization TID  . [MAR Hold] atenolol  100 mg Oral Daily  . [MAR Hold] heparin subcutaneous  5,000 Units Subcutaneous 3 times per day  . [MAR Hold] morphine   Intravenous 6 times per day  . [MAR Hold] pantoprazole (PROTONIX) IV  40 mg Intravenous Q24H  . [MAR Hold] piperacillin-tazobactam (ZOSYN)  IV  3.375 g Intravenous Q8H   Continuous Infusions: . sodium chloride    . dextrose 5 % and 0.9 % NaCl with KCl 20 mEq/L 100 mL/hr at 08/05/14 0604  . lactated ringers 1,000 mL (08/06/14 0854)  . lactated ringers    . lactated ringers

## 2014-08-06 NOTE — Interval H&P Note (Signed)
History and Physical Interval Note:  08/06/2014 9:17 AM  Donald Fry  has presented today for surgery, with the diagnosis of bile duct leak  The various methods of treatment have been discussed with the patient and family. After consideration of risks, benefits and other options for treatment, the patient has consented to  Procedure(s): ENDOSCOPIC RETROGRADE CHOLANGIOPANCREATOGRAPHY (ERCP) (N/A) as a surgical intervention .  The patient's history has been reviewed, patient examined, no change in status, stable for surgery.  I have reviewed the patient's chart and labs.  Questions were answered to the patient's satisfaction.     The recent H&P (dated *08/04/14**) was reviewed, the patient was examined and there is no change in the patients condition since that H&P was completed.   Erskine Emery  08/06/2014, 9:17 AM   Erskine Emery

## 2014-08-06 NOTE — Transfer of Care (Signed)
Immediate Anesthesia Transfer of Care Note  Patient: Donald Fry  Procedure(s) Performed: Procedure(s): ENDOSCOPIC RETROGRADE CHOLANGIOPANCREATOGRAPHY (ERCP) (N/A)  Patient Location: PACU and Endoscopy Unit  Anesthesia Type:General  Level of Consciousness: awake, oriented and patient cooperative  Airway & Oxygen Therapy: Patient Spontanous Breathing and Patient connected to face mask oxygen  Post-op Assessment: Report given to RN and Post -op Vital signs reviewed and stable  Post vital signs: Reviewed and stable  Last Vitals:  Filed Vitals:   08/06/14 0835  BP: 123/65  Pulse: 66  Temp: 36.8 C  Resp: 14    Complications: No apparent anesthesia complications

## 2014-08-06 NOTE — Anesthesia Preprocedure Evaluation (Signed)
Anesthesia Evaluation  Patient identified by MRN, date of birth, ID band Patient awake    Reviewed: Allergy & Precautions, NPO status , Patient's Chart, lab work & pertinent test results  Airway Mallampati: II  TM Distance: >3 FB Neck ROM: Full    Dental no notable dental hx.    Pulmonary Current Smoker,  breath sounds clear to auscultation  Pulmonary exam normal       Cardiovascular hypertension, Pt. on medications Rhythm:Regular Rate:Normal     Neuro/Psych negative neurological ROS  negative psych ROS   GI/Hepatic negative GI ROS, Neg liver ROS,   Endo/Other  negative endocrine ROS  Renal/GU negative Renal ROS  negative genitourinary   Musculoskeletal negative musculoskeletal ROS (+)   Abdominal   Peds negative pediatric ROS (+)  Hematology negative hematology ROS (+)   Anesthesia Other Findings   Reproductive/Obstetrics negative OB ROS                             Anesthesia Physical Anesthesia Plan  ASA: II  Anesthesia Plan: General   Post-op Pain Management:    Induction: Intravenous  Airway Management Planned: Oral ETT  Additional Equipment:   Intra-op Plan:   Post-operative Plan: Extubation in OR  Informed Consent: I have reviewed the patients History and Physical, chart, labs and discussed the procedure including the risks, benefits and alternatives for the proposed anesthesia with the patient or authorized representative who has indicated his/her understanding and acceptance.   Dental advisory given  Plan Discussed with: CRNA  Anesthesia Plan Comments:         Anesthesia Quick Evaluation

## 2014-08-06 NOTE — Progress Notes (Signed)
Patient ID: Donald Fry, male   DOB: Jan 23, 1956, 59 y.o.   MRN: [id number] Day of Surgery  Subjective: Pt feels pretty good right now.  Had a good night.  Tolerated clear liquids well last night.  Passing flatus, no BM  Objective: Vital signs in last 24 hours: Temp:  [98.2 F (36.8 C)-99 F (37.2 C)] 98.4 F (36.9 C) (03/04 1055) Pulse Rate:  [58-70] 59 (03/04 1235) Resp:  [14-26] 23 (03/04 1235) BP: (97-131)/(50-73) 126/64 mmHg (03/04 1230) SpO2:  [92 %-100 %] 94 % (03/04 1235) Weight:  [163 lb (73.936 kg)] 163 lb (73.936 kg) (03/04 0835) Last BM Date: 07/29/14  Intake/Output from previous day: 03/03 0701 - 03/04 0700 In: 1775 [I.V.:1600; IV Piggyback:100] Out: 1285 [Urine:800; Drains:485] Intake/Output this shift: Total I/O In: 1190 [I.V.:1100; Other:90] Out: 700 [Urine:400; Drains:300]  PE: Abd: soft, mild distention, +BS, midline wound closed with staples, JP drain with bilious appearing output Lungs: CTAB  Lab Results:   Recent Labs  08/04/14 0510  WBC 9.5  HGB 7.7*  HCT 24.7*  PLT 340   BMET  Recent Labs  08/04/14 0510  NA 144  K 3.9  CL 112  CO2 26  GLUCOSE 121*  BUN 12  CREATININE 0.44*  CALCIUM 7.9*   PT/INR No results for input(s): LABPROT, INR in the last 72 hours. CMP     Component Value Date/Time   NA 144 08/04/2014 0510   K 3.9 08/04/2014 0510   CL 112 08/04/2014 0510   CO2 26 08/04/2014 0510   GLUCOSE 121* 08/04/2014 0510   BUN 12 08/04/2014 0510   CREATININE 0.44* 08/04/2014 0510   CALCIUM 7.9* 08/04/2014 0510   PROT 5.3* 08/03/2014 0530   ALBUMIN 1.9* 08/03/2014 0530   AST 18 08/03/2014 0530   ALT 17 08/03/2014 0530   ALKPHOS 93 08/03/2014 0530   BILITOT 1.4* 08/03/2014 0530   GFRNONAA >90 08/04/2014 0510   GFRAA >90 08/04/2014 0510   Lipase     Component Value Date/Time   LIPASE 20 07/28/2014 0830       Studies/Results: Dg Ercp  08/06/2014   CLINICAL DATA:  Bile leak  EXAM: ERCP  TECHNIQUE: Multiple spot images  obtained with the fluoroscopic device and submitted for interpretation post-procedure.  COMPARISON:  None.  FINDINGS: Images demonstrate cannulation of the common bile duct and contrast filling the biliary tree. A biliary stent is present across the distal common bile duct.  IMPRESSION: Biliary stent placement.  These images were submitted for radiologic interpretation only. Please see the procedural report for the amount of contrast and the fluoroscopy time utilized.   Electronically Signed   By: Marybelle Killings M.D.   On: 08/06/2014 12:46    Anti-infectives: Anti-infectives    Start     Dose/Rate Route Frequency Ordered Stop   08/01/14 1400  piperacillin-tazobactam (ZOSYN) IVPB 3.375 g     3.375 g 12.5 mL/hr over 240 Minutes Intravenous Every 8 hours 08/01/14 0811     08/01/14 0900  piperacillin-tazobactam (ZOSYN) IVPB 3.375 g     3.375 g 100 mL/hr over 30 Minutes Intravenous  Once 08/01/14 0811 08/01/14 0914   07/31/14 0200  cefoTEtan (CEFOTAN) 2 g in dextrose 5 % 50 mL IVPB     2 g 100 mL/hr over 30 Minutes Intravenous Every 12 hours 07/30/14 2109 07/31/14 0326   07/29/14 2200  piperacillin-tazobactam (ZOSYN) IVPB 3.375 g  Status:  Discontinued     3.375 g 12.5 mL/hr over 240 Minutes  Intravenous 3 times per day 07/29/14 1328 07/30/14 2109   07/29/14 1500  piperacillin-tazobactam (ZOSYN) IVPB 3.375 g     3.375 g 100 mL/hr over 30 Minutes Intravenous  Once 07/29/14 1328 07/29/14 1549       Assessment/Plan   1. Right colon tumor, peritoneal implants and right upper quadrant, right lateral abdominal wall, and on gallbladder, cholelithiasis 1A. Laparoscopy converted to exploratory laparotomy, extended right colectomy, biopsy of peritoneal nodules on diaphragm with closure of diaphragm, cholecystectomy, POD 8 Dr. Zella Richer. 1B. Bile leak, no obvious leak seen on ERCP today, but stent place regardless -start clear liquids, hopefully can advance tomorrow 2. Anemia/Leukocytosis 3.  Hypertension 4. Tobacco use 39 years 5. Hx of ETOH use quit 4 years ago 6. Dyslipidemia  7. DVT prophylaxis - SCD/heparin 8. Zosyn day 6  9. Chronic benzodiazapine use - very anxious    LOS: 9 days    Donald Fry E 08/06/2014, 1:30 PM Pager: [phone]

## 2014-08-06 NOTE — Progress Notes (Signed)
OT Cancellation Note  Patient Details Name: Donald Fry MRN: [id number] DOB: 1955-11-30   Cancelled Treatment:    Reason Eval/Treat Not Completed: Other (comment)   Pt had stent placed today.  Will check back Monday as schedule permits.  Jasson Siegmann 08/06/2014, 12:00 PM  Lesle Chris, OTR/L [phone] 08/06/2014

## 2014-08-06 NOTE — Op Note (Signed)
[facility] [place], [address]   ERCP PROCEDURE REPORT        EXAM DATE: Aug 28, 2014  PATIENT NAME:          Donald Fry, Donald Fry          MR #: [id number] BIRTHDATE:       [birth year]     VISIT #:     [phone] ATTENDING:     Inda Castle, MD     STATUS:     inpatient ASSISTANT:      Elicia Lamp, Jenne Campus, [place]  INDICATIONS:  The patient is a 59 yr old male here for an ERCP due to PROCEDURE PERFORMED:     ERCP with stent placement MEDICATIONS:     Per Anesthesia  CONSENT: The patient understands the risks and benefits of the procedure and understands that these risks include, but are not limited to: sedation, allergic reaction, infection, perforation and/or bleeding. Alternative means of evaluation and treatment include, among others: physical exam, x-rays, and/or surgical intervention. The patient elects to proceed with this endoscopic procedure.  DESCRIPTION OF PROCEDURE: During intra-op preparation period all mechanical & medical equipment was checked for proper function. Hand hygiene and appropriate measures for infection prevention was taken. After the risks, benefits and alternatives of the procedure were thoroughly explained, Informed was verified, confirmed and timeout was successfully executed by the treatment team. With the patient in left semi-prone position, medications were administered intravenously.The    was passed from the mouth into the esophagus and further advanced from the esophagus into the stomach. From stomach scope was directed to the second portion of the duodenum. Major papilla was aligned with the duodenoscope. The scope position was confirmed fluoroscopically. Rest of the findings/therapeutics are given below. The scope was then completely withdrawn from the patient and the procedure completed. The pulse, BP, and O2 saturation were monitored and documented by the  physician and the nursing staff throughout the entire procedure. The patient was cared for as planned according to standard protocol. The patient was then discharged to recovery in stable condition and with appropriate post procedure care.  Common bile duct was selectively cannulated with a 0.35 mm wire. Injection was made both with a sphincterotomy cannula and with a balloon stone extractor with the balloon filled to 15 mm.  No frank extravasation of contrast dye was noted.  Because of persistent bilious drainage from the JP site and HIDA scan demonstrating a bile duct leak a #10 French 5 cm plastic biliary stent was placed.  The distal end of the stent was approximately 2 cm protruding from the papilla.    ADVERSE EVENT: IMPRESSIONS:     Co   Presumed bile duct leak - s/p placement of biliary stent  RECOMMENDATIONS: REPEAT EXAM:   ___________________________________ Inda Castle, MD eSigned:  Inda Castle, MD 08-28-2014 10:39 AM   cc:  CPT CODES: ICD9 CODES:  The ICD and CPT codes recommended by this software are interpretations from the data that the clinical staff has captured with the software.  The verification of the translation of this report to the ICD and CPT codes and modifiers is the sole responsibility of the health care institution and practicing physician where this report was generated.  [employer] will not be held responsible for the validity of the ICD and CPT codes included on this report.  AMA assumes no liability for data contained or not contained  herein. CPT is a Designer, television/film set of the [employer].   PATIENT NAME:  Donald Fry, Donald Fry MR#: [id number]

## 2014-08-07 DIAGNOSIS — K839 Disease of biliary tract, unspecified: Secondary | ICD-10-CM

## 2014-08-07 LAB — CBC WITH DIFFERENTIAL/PLATELET
BASOS PCT: 0 % (ref 0–1)
Basophils Absolute: 0 10*3/uL (ref 0.0–0.1)
Eosinophils Absolute: 0.3 10*3/uL (ref 0.0–0.7)
Eosinophils Relative: 3 % (ref 0–5)
HCT: 25.2 % — ABNORMAL LOW (ref 39.0–52.0)
Hemoglobin: 7.5 g/dL — ABNORMAL LOW (ref 13.0–17.0)
LYMPHS ABS: 1 10*3/uL (ref 0.7–4.0)
LYMPHS PCT: 12 % (ref 12–46)
MCH: 24.1 pg — ABNORMAL LOW (ref 26.0–34.0)
MCHC: 29.8 g/dL — AB (ref 30.0–36.0)
MCV: 81 fL (ref 78.0–100.0)
MONOS PCT: 7 % (ref 3–12)
Monocytes Absolute: 0.6 10*3/uL (ref 0.1–1.0)
NEUTROS PCT: 78 % — AB (ref 43–77)
Neutro Abs: 6.7 10*3/uL (ref 1.7–7.7)
PLATELETS: 447 10*3/uL — AB (ref 150–400)
RBC: 3.11 MIL/uL — ABNORMAL LOW (ref 4.22–5.81)
RDW: 18.8 % — ABNORMAL HIGH (ref 11.5–15.5)
WBC: 8.6 10*3/uL (ref 4.0–10.5)

## 2014-08-07 MED ORDER — MORPHINE SULFATE 2 MG/ML IJ SOLN: 1.0000 mg | INTRAMUSCULAR | Status: DC | PRN

## 2014-08-07 MED ORDER — SIMETHICONE 80 MG PO CHEW
80.0000 mg | CHEWABLE_TABLET | Freq: Four times a day (QID) | ORAL | Status: DC | PRN
  Administered 2014-08-07: 80 mg via ORAL
  Filled 2014-08-07: qty 1

## 2014-08-07 NOTE — Progress Notes (Signed)
PROGRESS NOTE  Donald Fry AOZ:[id number] DOB: 11-Oct-1955 DOA: 07/28/2014 PCP: [name], MD  Brief narrative:    59 y.o. male with a PMH of hyperlipidemia, anemia, hypertension and tobacco abuse who was admitted 07/28/14 with right upper quadrant abdominal pain for couple of days prior to this admission. Patient also reported having ongoing dyspnea on exertion for several months prior to the admission and unintentional weight loss. Evaluation on the admission included CT abdomen which showed a possible colon cancer near the hepatic flexure with surrounding lymphadenopathy. Patient underwent colonoscopy 07/29/2014 followed by surgical resection of the colon cancer 07/30/2014. Postoperative course has been complicated with bile leak and healthcare associated pneumonia. Additionally, JP on 08/05/14 with bile color and has increased in amount drainge so plan is for ERCP 08/06/14.  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  - Colon mass confirmed by colonoscopy on 07/29/2014. Bleeding thought to be secondary to this large colon mass. - During this hospitalization, patient has received 4 units of PRBC blood transfusion. He reports of bleeding since. - Hemoglobin 7.7 on 08/04/2014.  -Hgb has largely remained stable;  Baseline Hgb 7-8   Active problems: Colonic mass / colon cancer (adenocarcinoma) - Status post colonoscopy on 07/29/2014  -followed by exploratory laparotomy, extended right colectomy, biopsy of peritoneal nodules and cholecystectomy on 07/30/14. 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 -Patient will follow-up with oncology per scheduled appointment on discharge. -has appt with Dr. Benay Spice on 08/12/14  Bile leak -08/05/14--pt had increase bile color  drainage from JP again - HIDA showed bile leak.  - 08/06/14 ERCP (Dr. Boykin Peek leak from biliary system-->stent placed  Leukocytosis / Acute respiratory failure with hypoxia / HCAP / bronchospasm - Patient on empiric Zosyn on admission given rising WBC and possibility of microperforation from colonic mass. - Antibiotics were discontinued post surgery but because of significant jump in WBC 08/01/14 Zosyn was resumed. Chest x-ray 2/28 showed possible pneumonia. - We will continue Zosyn through 08/07/14-- This would complete 7 days of treatment with Zosyn after it was resumed on 08/01/2014. -clinically improving/stable on 2L [place]  Essential hypertension  - BP at goal, continue atenolol.  Hyperlipidemia  - Statin on hold until PO intake improves.  Anxiety  - Continue Xanax.  Tobacco abuse  - Continue nicotine patch.  Hypokalemia  - Secondary to GI losses. Supplemented. - Check BMP in am.    Hx of ETOH use quit 4 years ago DVT Prophylaxis  - Heparin subcutaneous ordered.   Code Status: Full.  Family Communication: Family updated at the bedside 08/07/14 Disposition Plan: likely home 08/09/14 if stable       IV access:  Peripheral IV Central line   Procedures and diagnostic studies:   Ct Abdomen Pelvis W Contrast 07/28/14: 1. Markedly abnormal hepatic flexure with wall thickening along a 10-12 cm length but extensive surrounding mesenteric nodularity, and increased regional lymph nodes. The appearance is atypical for acute colitis and highly suspicious for adenocarcinoma. GI consultation for followup colonoscopy recommended. 2. Small volume perihepatic fluid. Multiple up to 14 mm indeterminate low-density lesions in the liver. Several prominent but indeterminate retroperitoneal lymph nodes in the upper abdomen. 3. Abnormal fat stranding/nodularity continues to the gallbladder fossa, but the gallbladder does not appear inflamed.  Colonoscopy 07/29/14: 1. Large  circumferential obstructing friable mass at the hepaticflexure; multiple biopsies performed2. Mild diverticulosis was noted in the sigmoid colon anddescending colon.  Laparoscopy converted to exploratory laparotomy, extended right colectomy, biopsy of peritoneal nodules on diaphragm with closure of diaphragm, cholecystectomy 07/30/14: Done by Dr. Zella Richer.  Dg Chest Port 1 View 08/01/2014: Increased bilateral upper lobe and right middle lobe opacities are noted most consistent with pneumonia or possibly edema. Followup radiographs are recommended.   Nm Hepatobiliary Including Gb 08/02/2014: 1. Abnormal radiotracer collection adjacent to the inferior margin of the left hepatic lobe most concerning for a bile leak.   ERCP 08/06/2014   Medical Consultants:  Dr. Lucio Edward, GI. Dr. Jackolyn Confer, Surgery  Other Consultants:  None   IAnti-Infectives:   Zosyn 07/29/14---> 07/30/14, resumed 08/01/14          Procedures/Studies: Dg Chest 2 View  07/25/2014   CLINICAL DATA:  Acute shortness of breath with right chest and rib pain for 1 week. lifting injury.  EXAM: CHEST  2 VIEW  COMPARISON:  None.  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 Normal heart size and vascularity. Minor central bronchitic change without focal pneumonia, collapse or consolidation. No edema, effusion or pneumothorax. Trachea midline. No acute osseous finding.  IMPRESSION: Mild central bronchitic change.  Anterior eventration of the right hemidiaphragm  No superimposed acute process.   Electronically Signed   By: Jerilynn Mages.  Shick M.D.   On: 07/25/2014 09:03   Nm Hepatobiliary Including Gb  08/02/2014   CLINICAL DATA:  Status post cholecystectomy 07/30/2014  EXAM: NUCLEAR MEDICINE HEPATOBILIARY IMAGING  TECHNIQUE: Sequential images of the abdomen were obtained out to 60 minutes following intravenous administration of radiopharmaceutical.  RADIOPHARMACEUTICALS:  5.5 [employer] [id number]  Choletec  COMPARISON:  None.  FINDINGS: Flow study shows uniform distribution of radioactivity in the liver. Sequential imaging of the liver and abdomen over a period of 60 minutes shows uniform uptake of the tracer in the liver.  Prior cholecystectomy. Filling of the common bile duct. Radiotracer uptake is present in the small bowel at 20 minutes. There is abnormal radiotracer collection adjacent to the inferior margin of the left hepatic lobe most concerning for a bile leak.  At the end of 1-hour, there is relatively poor clearance of activity from the liver.  IMPRESSION: 1. Abnormal radiotracer collection adjacent to the inferior margin of the left hepatic lobe most concerning for a bile leak. These results were called by telephone at the time of interpretation on 08/02/2014 at 12:01 pm to Dr. Jacquelynn Cree , who verbally acknowledged these results.   Electronically Signed   By: Kathreen Devoid   On: 08/02/2014 12:18   US Abdomen Complete  07/25/2014   CLINICAL DATA:  Acute right upper quadrant pain for 1 week.  EXAM: ULTRASOUND ABDOMEN COMPLETE  COMPARISON:  None.  FINDINGS: Gallbladder: Intraluminal echogenic mobile abnormalities within the gallbladder without shadowing measure 1 cm in greatest diameter. These likely represent non shadowing stones. Normal wall thickness measuring 2.1 mm. No significant Murphy's sign. Small amount of free fluid along the right liver and gallbladder in the right upper quadrant remains nonspecific.  Common bile duct: Diameter: 2.8 mm  Liver: There are scattered small hypo echoic probable cysts throughout the liver. Left hepatic lobe anterior lateral hypoechoic minimally complex cyst measures 1.4 x 1.1 x 1.1 cm. Patent portal vein with normal hepatopetal flow. No biliary dilatation.  Right hepatic lobe hypoechoic cyst along the lateral inferior margin measures 12  x 13 x 14 mm.  IVC: Not visualized, obscured by bowel gas  Pancreas: Not visualized, obscured by bowel gas  Spleen:  Limited visualization. 5.7 cm length. No definite abnormality.  Right Kidney: Length: 10.9 cm. Echogenicity within normal limits. No mass or hydronephrosis visualized.  Left Kidney: Length: 11.9 cm. Echogenicity within normal limits. No mass or hydronephrosis visualized.  Abdominal aorta: No aneurysm visualized.  Other findings: None.  IMPRESSION: Probable non shadowing gallstones. No definite wall thickening or significant Murphy's sign.  Small amount of free fluid along the right liver margin and gallbladder remains nonspecific.  Obscured IVC, pancreas, and spleen by bowel gas.  Scattered hepatic cysts   Electronically Signed   By: Jerilynn Mages.  Shick M.D.   On: 07/25/2014 13:28   Ct Abdomen Pelvis W Contrast  07/28/2014   ADDENDUM REPORT: 07/28/2014 11:16  ADDENDUM: Study discussed by telephone with Dr. Quintella Reichert on 07/28/2014 at 1105 hours.   Electronically Signed   By: Genevie Ann M.D.   On: 07/28/2014 11:16   07/28/2014   CLINICAL DATA:  59 year old male with 2 weeks of right side abdominal pain. Recently diagnosed with gallstones. Initial encounter.  EXAM: CT ABDOMEN AND PELVIS WITH CONTRAST  TECHNIQUE: Multidetector CT imaging of the abdomen and pelvis was performed using the standard protocol following bolus administration of intravenous contrast.  CONTRAST:  76mL OMNIPAQUE IOHEXOL 300 MG/ML SOLN, 35mL OMNIPAQUE IOHEXOL 300 MG/ML SOLN, 125mL OMNIPAQUE IOHEXOL 300 MG/ML SOLN  COMPARISON:  Abdomen ultrasound 07/25/2014.  FINDINGS: Minor atelectasis at the right lung base. No pericardial or pleural effusion.  No acute osseous abnormality identified.  No pelvic free fluid. Retained stool in the rectum. Negative bladder. Moderately redundant sigmoid colon with retained stool. Negative left colon. Moderately redundant splenic flexure.  Abnormal hepatic flexure with elongated circumferential wall thickening and luminal narrowing, but also extensive surrounding hepatic flexure mesentery nodularity and irregular  intermediate density. Discrete rounded adjacent mesenteric nodes measuring up to 9 mm diameter are present.  The abnormal pericolic fat continues to the gallbladder fossa. However, the gallbladder itself does not appear distended or definitely inflamed. There is cholelithiasis evident at the gallbladder fundus.  The upstream ascending colon and cecum than have a more normal appearance. There is a gas-filled laterally situated appendix which tracks cephalad. The terminal ileum is within normal limits. Oral contrast has not yet reached the distal small bowel. No dilated small bowel loops. Negative stomach. The second portion of the duodenum also is in close proximity to the abnormal hepatic flexure.  Trace perihepatic free fluid. Multiple nonspecific small low-density lesions in liver such as the 14 mm right hepatic lobe entity on series 2, image 27. Also 8 mm lesion in the left lobe on image 27. Larger 14 mm lesion in the lateral left lobe on image 33.  Spleen, pancreas, right adrenal gland, portal venous system, and kidneys are within normal limits.  There is intermediate density nodularity adjacent to the medial lobe of the left adrenal gland which is nonspecific. To a degree this resembles an enlarged retroperitoneal lymph node. There is a similar prominent node situated between the cava and right corona of the diaphragm measuring 6 mm.  No pneumoperitoneum. Aortoiliac calcified atherosclerosis noted. Major arterial structures in the abdomen and pelvis are patent.  IMPRESSION: 1. Markedly abnormal hepatic flexure with wall thickening along a 10-12 cm length but extensive surrounding mesenteric nodularity, and increased regional lymph nodes. The appearance is atypical for acute colitis and highly suspicious for adenocarcinoma. GI  consultation for followup colonoscopy recommended. 2. Small volume perihepatic fluid. Multiple up to 14 mm indeterminate low-density lesions in the liver. Several prominent but  indeterminate retroperitoneal lymph nodes in the upper abdomen. 3. Abnormal fat stranding/nodularity continues to the gallbladder fossa, but the gallbladder does not appear inflamed.  Electronically Signed: By: Genevie Ann M.D. On: 07/28/2014 11:00   Dg Chest Port 1 View  08/01/2014   CLINICAL DATA:  Postoperative bile leak.  Hypertension.  EXAM: PORTABLE CHEST - 1 VIEW  COMPARISON:  July 30, 2014.  FINDINGS: Stable cardiomediastinal silhouette. Nasogastric tube is seen entering stomach. Right internal jugular catheter is stable in position with tip overlying expected position of the SVC. No pneumothorax is noted. Increased left perihilar opacity is noted concerning for pneumonia. Significantly increased opacity is noted in the right middle lobe concerning for pneumonia. Right upper lobe opacity is also noted concerning for pneumonia. No significant pleural effusion is noted. Bony thorax is intact.  IMPRESSION: Increased bilateral upper lobe and right middle lobe opacities are noted most consistent with pneumonia or possibly edema. Followup radiographs are recommended.   Electronically Signed   By: Marijo Conception, M.D.   On: 08/01/2014 10:35   Dg Chest Port 1 View  07/30/2014   CLINICAL DATA:  Evaluate PICC line placement  EXAM: PORTABLE CHEST - 1 VIEW  COMPARISON:  07/25/2014  FINDINGS: Low lung volumes. Mild right basilar atelectasis. No focal consolidation. No pleural effusion or pneumothorax.  The heart is normal in size.  Right IJ venous catheter terminates in the mid SVC.  Enteric tube courses into the proximal stomach.  Cholecystectomy clips.  IMPRESSION: Right IJ venous catheter terminates in the mid SVC. No pneumothorax.   Electronically Signed   By: Julian Hy M.D.   On: 07/30/2014 18:34   Dg Ercp  08/06/2014   CLINICAL DATA:  Bile leak  EXAM: ERCP  TECHNIQUE: Multiple spot images obtained with the fluoroscopic device and submitted for interpretation post-procedure.  COMPARISON:  None.   FINDINGS: Images demonstrate cannulation of the common bile duct and contrast filling the biliary tree. A biliary stent is present across the distal common bile duct.  IMPRESSION: Biliary stent placement.  These images were submitted for radiologic interpretation only. Please see the procedural report for the amount of contrast and the fluoroscopy time utilized.   Electronically Signed   By: Marybelle Killings M.D.   On: 08/06/2014 12:46         Subjective:   Objective: Filed Vitals:   08/07/14 0451 08/07/14 0604 08/07/14 0800 08/07/14 0838  BP:  125/70    Pulse:  64    Temp:  98.3 F (36.8 C)    TempSrc:  Oral    Resp: 14 20 14    Height:      Weight:      SpO2: 97% 97%  96%    Intake/Output Summary (Last 24 hours) at 08/07/14 1105 Last data filed at 08/07/14 1027  Gross per 24 hour  Intake   2840 ml  Output   1260 ml  Net   1580 ml   Weight change:  Exam:   General:  Pt is alert, follows commands appropriately, not in acute distress  HEENT: No icterus, No thrush, No neck mass, [place]/AT  Cardiovascular: RRR, S1/S2, no rubs, no gallops  Respiratory: CTA bilaterally, no wheezing, no crackles, no rhonchi  Abdomen: Soft/+BS, non tender, non distended, no guarding  Extremities: No edema, No lymphangitis, No petechiae, No rashes, no  synovitis  Data Reviewed: Basic Metabolic Panel:  Recent Labs Lab 08/01/14 0545 08/02/14 0500 08/03/14 0530 08/04/14 0510  NA 138 143 142 144  K 4.0 3.9 4.0 3.9  CL 108 111 112 112  CO2 26 27 25 26   GLUCOSE 138* 126* 128* 121*  BUN 9 11 11 12   CREATININE 0.53 0.55 0.54 0.44*  CALCIUM 7.2* 7.4* 7.5* 7.9*   Liver Function Tests:  Recent Labs Lab 08/01/14 0545 08/02/14 0500 08/03/14 0530  AST 25 21 18   ALT 23 17 17   ALKPHOS 58 60 93  BILITOT 0.6 0.7 1.4*  PROT 5.2* 5.2* 5.3*  ALBUMIN 2.2* 2.1* 1.9*    Recent Labs Lab 08/04/14 0510  AMYLASE 26   No results for input(s): AMMONIA in the last 168 hours. CBC:  Recent  Labs Lab 08/01/14 0545 08/02/14 0500 08/03/14 0530 08/04/14 0510 08/07/14 0408  WBC 21.0* 14.1* 11.3* 9.5 8.6  NEUTROABS  --   --   --   --  6.7  HGB 8.1* 7.1* 7.7* 7.7* 7.5*  HCT 26.8* 23.8* 25.1* 24.7* 25.2*  MCV 81.5 81.8 80.7 80.5 81.0  PLT 359 326 333 340 447*   Cardiac Enzymes: No results for input(s): CKTOTAL, CKMB, CKMBINDEX, TROPONINI in the last 168 hours. BNP: Invalid input(s): POCBNP CBG: No results for input(s): GLUCAP in the last 168 hours.  Recent Results (from the past 240 hour(s))  MRSA PCR Screening     Status: None   Collection Time: 08/03/14 11:19 PM  Result Value Ref Range Status   MRSA by PCR NEGATIVE NEGATIVE Final    Comment:        The GeneXpert MRSA Assay (FDA approved for NASAL specimens only), is one component of a comprehensive MRSA colonization surveillance program. It is not intended to diagnose MRSA infection nor to guide or monitor treatment for MRSA infections.      Scheduled Meds: . albuterol  2.5 mg Nebulization TID  . atenolol  100 mg Oral Daily  . heparin subcutaneous  5,000 Units Subcutaneous 3 times per day  . pantoprazole (PROTONIX) IV  40 mg Intravenous Q24H  . piperacillin-tazobactam (ZOSYN)  IV  3.375 g Intravenous Q8H   Continuous Infusions: . sodium chloride    . dextrose 5 % and 0.9 % NaCl with KCl 20 mEq/L 75 mL/hr at 08/07/14 1028  . lactated ringers 1,000 mL (08/06/14 0854)  . lactated ringers       Arnella Pralle, DO  [facility] Pager [phone]  If 7PM-7AM, please contact night-coverage [email or web address] Password [email or web address] 08/07/2014, 11:05 AM   LOS: 10 days

## 2014-08-07 NOTE — Progress Notes (Signed)
General Surgery Note  LOS: 10 days  POD -  1 Day Post-Op  Assessment/Plan: 1.  Laparoscopy converted to exploratory laparotomy, extended right colectomy, biopsy of peritoneal nodules on diaphragm with closure of diaphragm, cholecystectomy - 07/30/2014 - Dr. Zella Richer.  Zosyn -   On clear liquids - will advance diet/stop PCA  Try to ambulate more  2.  ENDOSCOPIC RETROGRADE CHOLANGIOPANCREATOGRAPHY (ERCP) - 08/06/2014 - Donald Fry  For apparent bile leak - but no source identified  Fluid in JP still looks bile stained  3. Hypertension 4. Tobacco use 39 years 5. Hx of ETOH use quit 4 years ago 6. Dyslipidemia  7. DVT prophylaxis - SCD/heparin 8.   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  Last BM 2 days ago.  He admits to being very week.  Wife and daughter in law in room.    He is to see Dr. Benay Spice on Thursday, 3/10. Objective:   Filed Vitals:   08/07/14 0604  BP: 125/70  Pulse: 64  Temp: 98.3 F (36.8 C)  Resp: 20     Intake/Output from previous day:  03/04 0701 - 03/05 0700 In: 4030 [P.O.:1440; I.V.:2300; IV Piggyback:200] Out: 1100 [Urine:600; Drains:500]  Intake/Output this shift:  Total I/O In: -  Out: 350 [Urine:350]   Physical Exam:   General: WN WM who is alert and oriented.    HEENT: Normal. Pupils equal. .   Lungs: clear.   Abdomen: Soft. Has BS   Wound: Midline wound looks good    Drain in RUQ - 500 cc recorded yesterday.  The fluid looks bile stained.   Lab Results:    Recent Labs  08/07/14 0408  WBC 8.6  HGB  7.5*  HCT 25.2*  PLT 447*    BMET  No results for input(s): NA, K, CL, CO2, GLUCOSE, BUN, CREATININE, CALCIUM in the last 72 hours.  PT/INR  No results for input(s): LABPROT, INR in the last 72 hours.  ABG  No results for input(s): PHART, HCO3 in the last 72 hours.  Invalid input(s): PCO2, PO2   Studies/Results:  Dg Ercp  08/06/2014   CLINICAL DATA:  Bile leak  EXAM: ERCP  TECHNIQUE: Multiple spot images obtained with the fluoroscopic device and submitted for interpretation post-procedure.  COMPARISON:  None.  FINDINGS: Images demonstrate cannulation of the common bile duct and contrast filling the biliary tree. A biliary stent is present across the distal common bile duct.  IMPRESSION: Biliary stent placement.  These images were submitted for radiologic interpretation only. Please see the procedural report for the amount of contrast and the fluoroscopy time utilized.   Electronically Signed   By: Marybelle Killings M.D.   On: 08/06/2014 12:46     Anti-infectives:   Anti-infectives    Start     Dose/Rate Route Frequency Ordered Stop   08/01/14 1400  piperacillin-tazobactam (ZOSYN) IVPB 3.375 g     3.375 g 12.5 mL/hr over 240 Minutes Intravenous Every 8 hours 08/01/14 0811     08/01/14  0900  piperacillin-tazobactam (ZOSYN) IVPB 3.375 g     3.375 g 100 mL/hr over 30 Minutes Intravenous  Once 08/01/14 0811 08/01/14 0914   07/31/14 0200  cefoTEtan (CEFOTAN) 2 g in dextrose 5 % 50 mL IVPB     2 g 100 mL/hr over 30 Minutes Intravenous Every 12 hours 07/30/14 2109 07/31/14 0326   07/29/14 2200  piperacillin-tazobactam (ZOSYN) IVPB 3.375 g  Status:  Discontinued     3.375 g 12.5 mL/hr over 240 Minutes Intravenous 3 times per day 07/29/14 1328 07/30/14 2109   07/29/14 1500  piperacillin-tazobactam (ZOSYN) IVPB 3.375 g     3.375 g 100 mL/hr over 30 Minutes Intravenous  Once 07/29/14 1328 07/29/14 1549      Donald Overall, MD, FACS Pager: [facility] Office:  [phone] 08/07/2014

## 2014-08-07 NOTE — Progress Notes (Signed)
    Progress Note   Subjective  Feels okay. Surgeon in room. Family at bedside. He wants solids.   Objective   Vital signs in last 24 hours: Temp:  [97.3 F (36.3 C)-98.5 F (36.9 C)] 98.3 F (36.8 C) (03/05 0604) Pulse Rate:  [51-67] 64 (03/05 0604) Resp:  [14-26] 14 (03/05 0800) BP: (97-131)/(50-73) 125/70 mmHg (03/05 0604) SpO2:  [92 %-100 %] 96 % (03/05 0838) Last BM Date: 07/29/14 General:   pleasant  white male in NAD Abdomen:  Soft, nontender and nondistended. A few bowel sounds. Small amount of thin bile tinged serous drainage in surgical drain. Extremities:  Without edema. Neurologic:  Alert and oriented,  grossly normal neurologically. Psych:  Cooperative. Normal mood and affect.  Intake/Output from previous day: 03/04 0701 - 03/05 0700 In: 4030 [P.O.:1440; I.V.:2300; IV Piggyback:200] Out: 1100 [Urine:600; Drains:500] Intake/Output this shift: Total I/O In: -  Out: 350 [Urine:350]  Lab Results:  Recent Labs  08/07/14 0408  WBC 8.6  HGB 7.5*  HCT 25.2*  PLT 447*    Studies/Results: Dg Ercp  08/06/2014   CLINICAL DATA:  Bile leak  EXAM: ERCP  TECHNIQUE: Multiple spot images obtained with the fluoroscopic device and submitted for interpretation post-procedure.  COMPARISON:  None.  FINDINGS: Images demonstrate cannulation of the common bile duct and contrast filling the biliary tree. A biliary stent is present across the distal common bile duct.  IMPRESSION: Biliary stent placement.  These images were submitted for radiologic interpretation only. Please see the procedural report for the amount of contrast and the fluoroscopy time utilized.   Electronically Signed   By: Marybelle Killings M.D.   On: 08/06/2014 12:46     Assessment / Plan:    64. 59 year old male with right colon cancer infiltrating gallbladder and with peritoneal implants. S/p right colectomy, cholecystectomy and biopsy of peritoneal implants. 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 Patient has appointment with Dr. Benay Spice on Thursday. Surgery advancing diet to fulls today.   2. Bile leak, s/p ERCP with stent placement yesterday. JP drainage 100cc on night shift (mainly serous, slight bile tinge)    LOS: 10 days   Donald Fry  08/07/2014, 9:26 AM   GI Attending Note  I have personally taken an interval history, reviewed the chart, and examined the patient. JP drainage has dramatically decreased.  Plan repeat ERCP in 6-8 weeks.  I would like to see the patient in about 1 month.  Signing off.  Sandy Salaam. Deatra Ina, MD, [facility] [phone]

## 2014-08-08 ENCOUNTER — Inpatient Hospital Stay (HOSPITAL_COMMUNITY): Payer: Medicaid Other

## 2014-08-08 DIAGNOSIS — R509 Fever, unspecified: Secondary | ICD-10-CM

## 2014-08-08 LAB — BASIC METABOLIC PANEL
Anion gap: 9 (ref 5–15)
BUN: 10 mg/dL (ref 6–23)
CO2: 22 mmol/L (ref 19–32)
CREATININE: 0.44 mg/dL — AB (ref 0.50–1.35)
Calcium: 7.8 mg/dL — ABNORMAL LOW (ref 8.4–10.5)
Chloride: 110 mmol/L (ref 96–112)
GFR calc Af Amer: >90 mL/min (ref >90)
GFR calc non Af Amer: >90 mL/min (ref >90)
GLUCOSE: 105 mg/dL — AB (ref 70–99)
POTASSIUM: 3.8 mmol/L (ref 3.5–5.1)
SODIUM: 141 mmol/L (ref 135–145)

## 2014-08-08 LAB — URINALYSIS, ROUTINE W REFLEX MICROSCOPIC
Bilirubin Urine: NEGATIVE
Glucose, UA: NEGATIVE mg/dL
Hgb urine dipstick: NEGATIVE
Ketones, ur: 15 mg/dL — AB
Leukocytes, UA: NEGATIVE
Nitrite: NEGATIVE
PH: 5.5 (ref 5.0–8.0)
Protein, ur: NEGATIVE mg/dL
Specific Gravity, Urine: 1.021 (ref 1.005–1.030)
UROBILINOGEN UA: 1 mg/dL (ref 0.0–1.0)

## 2014-08-08 LAB — LACTIC ACID, PLASMA: Lactic Acid, Venous: 1.3 mmol/L (ref 0.5–2.0)

## 2014-08-08 LAB — CBC
HCT: 24.7 % — ABNORMAL LOW (ref 39.0–52.0)
HEMOGLOBIN: 7.5 g/dL — AB (ref 13.0–17.0)
MCH: 24.5 pg — ABNORMAL LOW (ref 26.0–34.0)
MCHC: 30.4 g/dL (ref 30.0–36.0)
MCV: 80.7 fL (ref 78.0–100.0)
PLATELETS: 567 10*3/uL — AB (ref 150–400)
RBC: 3.06 MIL/uL — ABNORMAL LOW (ref 4.22–5.81)
RDW: 19.7 % — AB (ref 11.5–15.5)
WBC: 11.5 10*3/uL — AB (ref 4.0–10.5)

## 2014-08-08 LAB — CLOSTRIDIUM DIFFICILE BY PCR: Toxigenic C. Difficile by PCR: NEGATIVE

## 2014-08-08 MED ORDER — SODIUM CHLORIDE 0.9 % IV SOLN
500.0000 mg | Freq: Four times a day (QID) | INTRAVENOUS | Status: AC
  Administered 2014-08-08 – 2014-08-15 (×26): 500 mg via INTRAVENOUS
  Filled 2014-08-08 (×26): qty 500

## 2014-08-08 MED ORDER — ALBUTEROL SULFATE (2.5 MG/3ML) 0.083% IN NEBU
2.5000 mg | INHALATION_SOLUTION | Freq: Two times a day (BID) | RESPIRATORY_TRACT | Status: DC
  Administered 2014-08-08 – 2014-08-09 (×2): 2.5 mg via RESPIRATORY_TRACT
  Filled 2014-08-08 (×3): qty 3

## 2014-08-08 MED ORDER — PANTOPRAZOLE SODIUM 40 MG PO TBEC
40.0000 mg | DELAYED_RELEASE_TABLET | Freq: Every day | ORAL | Status: DC
  Administered 2014-08-08 – 2014-08-15 (×8): 40 mg via ORAL
  Filled 2014-08-08 (×9): qty 1

## 2014-08-08 MED ORDER — ACETAMINOPHEN 325 MG PO TABS
650.0000 mg | ORAL_TABLET | Freq: Four times a day (QID) | ORAL | Status: DC | PRN
  Administered 2014-08-08 – 2014-08-10 (×5): 650 mg via ORAL
  Filled 2014-08-08 (×5): qty 2

## 2014-08-08 MED ORDER — VANCOMYCIN HCL IN DEXTROSE 750-5 MG/150ML-% IV SOLN
750.0000 mg | Freq: Three times a day (TID) | INTRAVENOUS | Status: DC
  Administered 2014-08-08 – 2014-08-09 (×5): 750 mg via INTRAVENOUS
  Filled 2014-08-08 (×7): qty 150

## 2014-08-08 MED ORDER — FUROSEMIDE 10 MG/ML IJ SOLN
20.0000 mg | Freq: Once | INTRAMUSCULAR | Status: AC
  Administered 2014-08-08: 20 mg via INTRAVENOUS
  Filled 2014-08-08: qty 2

## 2014-08-08 MED ORDER — SODIUM CHLORIDE 0.9 % IV SOLN: 500.0000 mg | Freq: Three times a day (TID) | INTRAVENOUS | Status: DC

## 2014-08-08 NOTE — Progress Notes (Signed)
General Surgery Note  LOS: 11 days  POD -  2 Days Post-Op  Assessment/Plan: 1.  Laparoscopy converted to exploratory laparotomy, extended right colectomy, biopsy of peritoneal nodules on diaphragm with closure of diaphragm, cholecystectomy - 07/30/2014 - Dr. Zella Richer.  Zosyn -   Doesn't look like he feels as good as yesterday  To advance to reg diet  2.  ENDOSCOPIC RETROGRADE CHOLANGIOPANCREATOGRAPHY (ERCP) - 08/06/2014 - Deatra Ina  For apparent bile leak - but no source identified  3. Hypertension 4. Tobacco use 39 years 5. Hx of ETOH use quit 4 years ago 6. Dyslipidemia  7. DVT prophylaxis - SCD/heparin 8.   Anemia  Hgb - 7.5 - 08/08/2014 9.  Leukocytosis  WBC up some - 11,500 - 08/08/2014 10.   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  Took some of the full liquids.  Wants to try some regular food.  No family in room.  He is to see Dr. Benay Spice on Thursday, 3/10. Objective:   Filed Vitals:   08/08/14 0420  BP: 125/64  Pulse: 82  Temp: 98.8 F (37.1 C)  Resp: 22     Intake/Output from previous day:  03/05 0701 - 03/06 0700 In: 3114.2 [P.O.:1080; I.V.:1884.2; IV Piggyback:150] Out: 1610 [Urine:1225; Drains:20]  Intake/Output this shift:      Physical Exam:   General: WN WM who is alert and oriented.    HEENT: Normal. Pupils equal. .   Lungs: clear.   Abdomen: Soft. Has BS   Wound: Midline wound looks good    Drain in RUQ - 20 cc recorded yesterday.  Dramatic decrease in  amount   Lab Results:     Recent Labs  08/07/14 0408 08/08/14 0515  WBC 8.6 11.5*  HGB 7.5* 7.5*  HCT 25.2* 24.7*  PLT 447* 567*    BMET    Recent Labs  08/08/14 0515  NA 141  K 3.8  CL 110  CO2 22  GLUCOSE 105*  BUN 10  CREATININE 0.44*  CALCIUM 7.8*    PT/INR  No results for input(s): LABPROT, INR in the last 72 hours.  ABG  No results for input(s): PHART, HCO3 in the last 72 hours.  Invalid input(s): PCO2, PO2   Studies/Results:  Dg Ercp  08/06/2014   CLINICAL DATA:  Bile leak  EXAM: ERCP  TECHNIQUE: Multiple spot images obtained with the fluoroscopic device and submitted for interpretation post-procedure.  COMPARISON:  None.  FINDINGS: Images demonstrate cannulation of the common bile duct and contrast filling the biliary tree. A biliary stent is present across the distal common bile duct.  IMPRESSION: Biliary stent placement.  These images were submitted for radiologic interpretation only. Please see the procedural report for the amount of contrast and the fluoroscopy time utilized.   Electronically Signed   By: Marybelle Killings M.D.   On: 08/06/2014 12:46     Anti-infectives:   Anti-infectives    Start  Dose/Rate Route Frequency Ordered Stop   08/01/14 1400  piperacillin-tazobactam (ZOSYN) IVPB 3.375 g     3.375 g 12.5 mL/hr over 240 Minutes Intravenous Every 8 hours 08/01/14 0811     08/01/14 0900  piperacillin-tazobactam (ZOSYN) IVPB 3.375 g     3.375 g 100 mL/hr over 30 Minutes Intravenous  Once 08/01/14 0811 08/01/14 0914   07/31/14 0200  cefoTEtan (CEFOTAN) 2 g in dextrose 5 % 50 mL IVPB     2 g 100 mL/hr over 30 Minutes Intravenous Every 12 hours 07/30/14 2109 07/31/14 0326   07/29/14 2200  piperacillin-tazobactam (ZOSYN) IVPB 3.375 g  Status:  Discontinued     3.375 g 12.5 mL/hr over 240 Minutes Intravenous 3 times per day 07/29/14 1328 07/30/14 2109   07/29/14 1500  piperacillin-tazobactam (ZOSYN) IVPB 3.375 g     3.375 g 100 mL/hr over 30  Minutes Intravenous  Once 07/29/14 1328 07/29/14 1549      Alphonsa Overall, MD, FACS Pager: [facility] Office: [phone] 08/08/2014

## 2014-08-08 NOTE — Progress Notes (Signed)
Key Points: Use following P&T approved IV to PO non-antibiotic change policy.  Description contains the criteria that are approved Note: Policy Excludes:  Esophagectomy patientsPHARMACIST - PHYSICIAN COMMUNICATION DR:   Tat CONCERNING: IV to Oral Route Change Policy  RECOMMENDATION: This patient is receiving Protonix by the intravenous route.  Based on criteria approved by the Pharmacy and Therapeutics Committee, the intravenous medication(s) is/are being converted to the equivalent oral dose form(s).   DESCRIPTION: These criteria include:  The patient is eating (either orally or via tube) and/or has been taking other orally administered medications for a least 24 hours  The patient has no evidence of active gastrointestinal bleeding or impaired GI absorption (gastrectomy, short bowel, patient on TNA or NPO).  If you have questions about this conversion, please contact the Pharmacy Department  []   [phone] )  [facility] []   [phone] )  [facility] Pont[facility]  []   [phone] )  [facility] [x]   [phone] )  [place], [place], [facility] 08/08/2014 12:13 PM

## 2014-08-08 NOTE — Progress Notes (Signed)
ANTIBIOTIC CONSULT NOTE - FOLLOW UP  Pharmacy Consult for Vancomycin Indication: Sepsis  No Known Allergies  Patient Measurements: Height: 5\' 9"  (175.3 cm) Weight: 163 lb (73.936 kg) IBW/kg (Calculated) : 70.7  Vital Signs: Temp: 98.8 F (37.1 C) (03/06 0420) Temp Source: Oral (03/06 0420) BP: 125/64 mmHg (03/06 0420) Pulse Rate: 82 (03/06 0420) Intake/Output from previous day: 03/05 0701 - 03/06 0700 In: 3114.2 [P.O.:1080; I.V.:1884.2; IV Piggyback:150] Out: 0932 [Urine:1225; Drains:20]  Labs:  Recent Labs  08/07/14 0408 08/08/14 0515  WBC 8.6 11.5*  HGB 7.5* 7.5*  PLT 447* 567*  CREATININE  --  0.44*   Estimated Creatinine Clearance: 98.2 mL/min (by C-G formula based on Cr of 0.44). No results for input(s): VANCOTROUGH, VANCOPEAK, VANCORANDOM, GENTTROUGH, GENTPEAK, GENTRANDOM, TOBRATROUGH, TOBRAPEAK, TOBRARND, AMIKACINPEAK, AMIKACINTROU, AMIKACIN in the last 72 hours.   Microbiology: Recent Results (from the past 720 hour(s))  MRSA PCR Screening     Status: None   Collection Time: 08/03/14 11:19 PM  Result Value Ref Range Status   MRSA by PCR NEGATIVE NEGATIVE Final    Comment:        The GeneXpert MRSA Assay (FDA approved for NASAL specimens only), is one component of a comprehensive MRSA colonization surveillance program. It is not intended to diagnose MRSA infection nor to guide or monitor treatment for MRSA infections.   Clostridium Difficile by PCR     Status: None   Collection Time: 08/08/14  6:06 AM  Result Value Ref Range Status   C difficile by pcr NEGATIVE NEGATIVE Final     Assessment: Donald Fry admitted 2/24 for evaluation of persistent R flank pain and anemia. Colonoscopy (2/25) revealed large circumferential obstructing bleeding friable mass at the hepatic flexure. Pt is s/p exploratory laparotomy, extended right colectomy, biopsy of peritoneal nodules and cholecystectomy on 07/30/14.   Pt was originally started on Zosyn per Pharmacy on 2/25  for intra-abdominal infection, which was discontinued post-surgery on 2/26. Zosyn was restarted on 2/28 for pneumonia vs intra-abdominal infection.  Today, c/o chills, N/D, with concern for sepsis.  Zosyn changed to Primaxin per MD and Pharmacy is consulted to start Vancomycin.  2/27 >> Cefotetan x1 2/26 >> Zosyn >> 2/26 2/28 >> Zosyn >> 3/6 3/6 >> Vanc >>  3/6 >> imipenem >>  Today, 08/08/2014:  Tmax: afebrile  WBCs: 11.5  Renal: SCr 0.44, CrCl ~ 98 ml/min  Goal of Therapy:  Vancomycin trough level 15-20 mcg/ml Appropriate abx dosing, eradication of infection.   Plan:   Vancomycin 750 mg IV q8h.  Measure Vanc trough at steady state.  Follow up renal fxn, culture results, and clinical course.   Gretta Arab PharmD, BCPS Pager [phone] 08/08/2014 1:47 PM

## 2014-08-08 NOTE — Progress Notes (Addendum)
PROGRESS NOTE  Donald Fry GUR:[id number] DOB: [birth year] DOA: 07/28/2014 PCP: [name], MD  Brief History 59 y.o. male with a PMH of hyperlipidemia, anemia, hypertension and tobacco abuse who was admitted 07/28/14 with right upper quadrant abdominal pain for couple of days prior to this admission. Patient also reported having ongoing dyspnea on exertion for several months prior to the admission and unintentional weight loss. Evaluation on the admission included CT abdomen which showed a possible colon cancer near the hepatic flexure with surrounding lymphadenopathy. Patient underwent colonoscopy 07/29/2014 followed by surgical resection of the colon cancer 07/30/2014. Postoperative course has been complicated with bile leak and healthcare associated pneumonia. Additionally, JP on 08/05/14 with bile color and has increased in amount drainge so plan is for ERCP 08/06/14.  Interim  -Today, patient feels weaker. He is having chills. Has some nausea and diarrhea. States that his breathing is not any worse than usual. Denies shortness of breath or chest pain. Abdominal pain is controlled. 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  - Colon mass confirmed by colonoscopy on 07/29/2014. Bleeding thought to be secondary to this large colon mass. - During this hospitalization, patient has received 4 units of PRBC blood transfusion. He reports no bleeding since. - Hemoglobin 7.5 on 08/08/2014.  -Hgb has largely remained stable; Baseline Hgb 7-8  New Fever -102.39F@1320  on 08/08/14 -check CXR -blood cultures x 2 sets -UA and urine cultures -08/08/14--Cdiff-- NEG -JP drain--20cc in past 24 hrs -check lactic acid -d/c zosyn--start imipenem -start vancomycin -HR 92--RR18--134/59--91-92% on 2L--vitals at 1320 on  08/08/14 Active problems: Colonic mass / colon cancer (adenocarcinoma) - Status post colonoscopy on 07/29/2014  -followed by exploratory laparotomy, extended right colectomy, biopsy of peritoneal nodules and cholecystectomy on 07/30/14. 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  -Patient will follow-up with oncology per scheduled appointment on discharge. -has appt with Dr. Benay Spice on 08/12/14  Bile leak -08/05/14--pt had increase bile color drainage from JP again - HIDA showed bile leak.  - 08/06/14 ERCP (Dr. Boykin Peek leak from biliary system-->stent placed -JP drain--20cc in past 24 hrs  Leukocytosis / Acute respiratory failure with hypoxia / HCAP / bronchospasm - Patient on empiric Zosyn on admission given rising WBC and possibility of microperforation from colonic mass. - Antibiotics were discontinued post surgery but because of significant jump in WBC 08/01/14 Zosyn was resumed. Chest x-ray 2/28 showed possible pneumonia. - We will continue Zosyn through 08/07/14-- This would complete 7 days of treatment with Zosyn after it was resumed on 08/01/2014. -clinically improving/stable on 2L [place]  Essential hypertension  - BP at goal, continue atenolol.  Hyperlipidemia  - Statin on hold until PO intake improves.  Anxiety  - Continue Xanax.  Tobacco abuse  - Continue nicotine patch.  Hypokalemia  - Secondary to GI losses. Supplemented. - Check BMP in am.    Hx of ETOH use quit 4 years ago DVT Prophylaxis  - Heparin subcutaneous ordered.     Procedures/Studies: Dg Chest 2 View  07/25/2014   CLINICAL DATA:  Acute shortness of breath with right chest and rib pain for 1 week. lifting injury.  EXAM: CHEST  2 VIEW  COMPARISON:  None.  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 Normal heart size and vascularity. Minor  central bronchitic change without focal pneumonia, collapse or consolidation. No edema,  effusion or pneumothorax. Trachea midline. No acute osseous finding.  IMPRESSION: Mild central bronchitic change.  Anterior eventration of the right hemidiaphragm  No superimposed acute process.   Electronically Signed   By: Jerilynn Mages.  Shick M.D.   On: 07/25/2014 09:03   Nm Hepatobiliary Including Gb  08/02/2014   CLINICAL DATA:  Status post cholecystectomy 07/30/2014  EXAM: NUCLEAR MEDICINE HEPATOBILIARY IMAGING  TECHNIQUE: Sequential images of the abdomen were obtained out to 60 minutes following intravenous administration of radiopharmaceutical.  RADIOPHARMACEUTICALS:  5.5 [employer] [id number] Choletec  COMPARISON:  None.  FINDINGS: Flow study shows uniform distribution of radioactivity in the liver. Sequential imaging of the liver and abdomen over a period of 60 minutes shows uniform uptake of the tracer in the liver.  Prior cholecystectomy. Filling of the common bile duct. Radiotracer uptake is present in the small bowel at 20 minutes. There is abnormal radiotracer collection adjacent to the inferior margin of the left hepatic lobe most concerning for a bile leak.  At the end of 1-hour, there is relatively poor clearance of activity from the liver.  IMPRESSION: 1. Abnormal radiotracer collection adjacent to the inferior margin of the left hepatic lobe most concerning for a bile leak. These results were called by telephone at the time of interpretation on 08/02/2014 at 12:01 pm to Dr. Jacquelynn Cree , who verbally acknowledged these results.   Electronically Signed   By: Kathreen Devoid   On: 08/02/2014 12:18   US Abdomen Complete  07/25/2014   CLINICAL DATA:  Acute right upper quadrant pain for 1 week.  EXAM: ULTRASOUND ABDOMEN COMPLETE  COMPARISON:  None.  FINDINGS: Gallbladder: Intraluminal echogenic mobile abnormalities within the gallbladder without shadowing measure 1 cm in greatest diameter. These likely represent non shadowing stones. Normal wall thickness measuring 2.1 mm. No significant Murphy's sign. Small  amount of free fluid along the right liver and gallbladder in the right upper quadrant remains nonspecific.  Common bile duct: Diameter: 2.8 mm  Liver: There are scattered small hypo echoic probable cysts throughout the liver. Left hepatic lobe anterior lateral hypoechoic minimally complex cyst measures 1.4 x 1.1 x 1.1 cm. Patent portal vein with normal hepatopetal flow. No biliary dilatation.  Right hepatic lobe hypoechoic cyst along the lateral inferior margin measures 12 x 13 x 14 mm.  IVC: Not visualized, obscured by bowel gas  Pancreas: Not visualized, obscured by bowel gas  Spleen: Limited visualization. 5.7 cm length. No definite abnormality.  Right Kidney: Length: 10.9 cm. Echogenicity within normal limits. No mass or hydronephrosis visualized.  Left Kidney: Length: 11.9 cm. Echogenicity within normal limits. No mass or hydronephrosis visualized.  Abdominal aorta: No aneurysm visualized.  Other findings: None.  IMPRESSION: Probable non shadowing gallstones. No definite wall thickening or significant Murphy's sign.  Small amount of free fluid along the right liver margin and gallbladder remains nonspecific.  Obscured IVC, pancreas, and spleen by bowel gas.  Scattered hepatic cysts   Electronically Signed   By: Jerilynn Mages.  Shick M.D.   On: 07/25/2014 13:28   Ct Abdomen Pelvis W Contrast  07/28/2014   ADDENDUM REPORT: 07/28/2014 11:16  ADDENDUM: Study discussed by telephone with Dr. Quintella Reichert on 07/28/2014 at 1105 hours.   Electronically Signed   By: Genevie Ann M.D.   On: 07/28/2014 11:16   07/28/2014   CLINICAL DATA:  59 year old male with 2 weeks of right side abdominal pain. Recently diagnosed with gallstones. Initial  encounter.  EXAM: CT ABDOMEN AND PELVIS WITH CONTRAST  TECHNIQUE: Multidetector CT imaging of the abdomen and pelvis was performed using the standard protocol following bolus administration of intravenous contrast.  CONTRAST:  58mL OMNIPAQUE IOHEXOL 300 MG/ML SOLN, 17mL OMNIPAQUE IOHEXOL 300 MG/ML  SOLN, 155mL OMNIPAQUE IOHEXOL 300 MG/ML SOLN  COMPARISON:  Abdomen ultrasound 07/25/2014.  FINDINGS: Minor atelectasis at the right lung base. No pericardial or pleural effusion.  No acute osseous abnormality identified.  No pelvic free fluid. Retained stool in the rectum. Negative bladder. Moderately redundant sigmoid colon with retained stool. Negative left colon. Moderately redundant splenic flexure.  Abnormal hepatic flexure with elongated circumferential wall thickening and luminal narrowing, but also extensive surrounding hepatic flexure mesentery nodularity and irregular intermediate density. Discrete rounded adjacent mesenteric nodes measuring up to 9 mm diameter are present.  The abnormal pericolic fat continues to the gallbladder fossa. However, the gallbladder itself does not appear distended or definitely inflamed. There is cholelithiasis evident at the gallbladder fundus.  The upstream ascending colon and cecum than have a more normal appearance. There is a gas-filled laterally situated appendix which tracks cephalad. The terminal ileum is within normal limits. Oral contrast has not yet reached the distal small bowel. No dilated small bowel loops. Negative stomach. The second portion of the duodenum also is in close proximity to the abnormal hepatic flexure.  Trace perihepatic free fluid. Multiple nonspecific small low-density lesions in liver such as the 14 mm right hepatic lobe entity on series 2, image 27. Also 8 mm lesion in the left lobe on image 27. Larger 14 mm lesion in the lateral left lobe on image 33.  Spleen, pancreas, right adrenal gland, portal venous system, and kidneys are within normal limits.  There is intermediate density nodularity adjacent to the medial lobe of the left adrenal gland which is nonspecific. To a degree this resembles an enlarged retroperitoneal lymph node. There is a similar prominent node situated between the cava and right corona of the diaphragm measuring 6 mm.  No  pneumoperitoneum. Aortoiliac calcified atherosclerosis noted. Major arterial structures in the abdomen and pelvis are patent.  IMPRESSION: 1. Markedly abnormal hepatic flexure with wall thickening along a 10-12 cm length but extensive surrounding mesenteric nodularity, and increased regional lymph nodes. The appearance is atypical for acute colitis and highly suspicious for adenocarcinoma. GI consultation for followup colonoscopy recommended. 2. Small volume perihepatic fluid. Multiple up to 14 mm indeterminate low-density lesions in the liver. Several prominent but indeterminate retroperitoneal lymph nodes in the upper abdomen. 3. Abnormal fat stranding/nodularity continues to the gallbladder fossa, but the gallbladder does not appear inflamed.  Electronically Signed: By: Genevie Ann M.D. On: 07/28/2014 11:00   Dg Chest Port 1 View  08/01/2014   CLINICAL DATA:  Postoperative bile leak.  Hypertension.  EXAM: PORTABLE CHEST - 1 VIEW  COMPARISON:  July 30, 2014.  FINDINGS: Stable cardiomediastinal silhouette. Nasogastric tube is seen entering stomach. Right internal jugular catheter is stable in position with tip overlying expected position of the SVC. No pneumothorax is noted. Increased left perihilar opacity is noted concerning for pneumonia. Significantly increased opacity is noted in the right middle lobe concerning for pneumonia. Right upper lobe opacity is also noted concerning for pneumonia. No significant pleural effusion is noted. Bony thorax is intact.  IMPRESSION: Increased bilateral upper lobe and right middle lobe opacities are noted most consistent with pneumonia or possibly edema. Followup radiographs are recommended.   Electronically Signed   By: Sabino Dick  Brooke Bonito, M.D.   On: 08/01/2014 10:35   Dg Chest Port 1 View  07/30/2014   CLINICAL DATA:  Evaluate PICC line placement  EXAM: PORTABLE CHEST - 1 VIEW  COMPARISON:  07/25/2014  FINDINGS: Low lung volumes. Mild right basilar atelectasis. No focal  consolidation. No pleural effusion or pneumothorax.  The heart is normal in size.  Right IJ venous catheter terminates in the mid SVC.  Enteric tube courses into the proximal stomach.  Cholecystectomy clips.  IMPRESSION: Right IJ venous catheter terminates in the mid SVC. No pneumothorax.   Electronically Signed   By: Julian Hy M.D.   On: 07/30/2014 18:34   Dg Ercp  08/06/2014   CLINICAL DATA:  Bile leak  EXAM: ERCP  TECHNIQUE: Multiple spot images obtained with the fluoroscopic device and submitted for interpretation post-procedure.  COMPARISON:  None.  FINDINGS: Images demonstrate cannulation of the common bile duct and contrast filling the biliary tree. A biliary stent is present across the distal common bile duct.  IMPRESSION: Biliary stent placement.  These images were submitted for radiologic interpretation only. Please see the procedural report for the amount of contrast and the fluoroscopy time utilized.   Electronically Signed   By: Marybelle Killings M.D.   On: 08/06/2014 12:46          Objective: Filed Vitals:   08/08/14 0855 08/08/14 0857 08/08/14 0900 08/08/14 0905  BP:      Pulse:      Temp:      TempSrc:      Resp:      Height:      Weight:      SpO2: 87% 100% 98% 98%    Intake/Output Summary (Last 24 hours) at 08/08/14 1252 Last data filed at 08/08/14 6962  Gross per 24 hour  Intake 2874.17 ml  Output    635 ml  Net 2239.17 ml   Weight change:  Exam:   General:  Pt is alert, follows commands appropriately, not in acute distress  HEENT: No icterus, No thrush,[place]/AT  Cardiovascular: RRR, S1/S2, no rubs, no gallops  Respiratory: Bibasilar rales. No wheezing. Good air movement.  Abdomen: Soft/+BS, non tender, non distended, no guarding  Extremities: No edema, No lymphangitis, No petechiae, No rashes, no synovitis  Data Reviewed: Basic Metabolic Panel:  Recent Labs Lab 08/02/14 0500 08/03/14 0530 08/04/14 0510 08/08/14 0515  NA 143 142 144 141  K  3.9 4.0 3.9 3.8  CL 111 112 112 110  CO2 27 25 26 22   GLUCOSE 126* 128* 121* 105*  BUN 11 11 12 10   CREATININE 0.55 0.54 0.44* 0.44*  CALCIUM 7.4* 7.5* 7.9* 7.8*   Liver Function Tests:  Recent Labs Lab 08/02/14 0500 08/03/14 0530  AST 21 18  ALT 17 17  ALKPHOS 60 93  BILITOT 0.7 1.4*  PROT 5.2* 5.3*  ALBUMIN 2.1* 1.9*    Recent Labs Lab 08/04/14 0510  AMYLASE 26   No results for input(s): AMMONIA in the last 168 hours. CBC:  Recent Labs Lab 08/02/14 0500 08/03/14 0530 08/04/14 0510 08/07/14 0408 08/08/14 0515  WBC 14.1* 11.3* 9.5 8.6 11.5*  NEUTROABS  --   --   --  6.7  --   HGB 7.1* 7.7* 7.7* 7.5* 7.5*  HCT 23.8* 25.1* 24.7* 25.2* 24.7*  MCV 81.8 80.7 80.5 81.0 80.7  PLT 326 333 340 447* 567*   Cardiac Enzymes: No results for input(s): CKTOTAL, CKMB, CKMBINDEX, TROPONINI in the last 168 hours. BNP: Invalid  input(s): POCBNP CBG: No results for input(s): GLUCAP in the last 168 hours.  Recent Results (from the past 240 hour(s))  MRSA PCR Screening     Status: None   Collection Time: 08/03/14 11:19 PM  Result Value Ref Range Status   MRSA by PCR NEGATIVE NEGATIVE Final    Comment:        The GeneXpert MRSA Assay (FDA approved for NASAL specimens only), is one component of a comprehensive MRSA colonization surveillance program. It is not intended to diagnose MRSA infection nor to guide or monitor treatment for MRSA infections.   Clostridium Difficile by PCR     Status: None   Collection Time: 08/08/14  6:06 AM  Result Value Ref Range Status   C difficile by pcr NEGATIVE NEGATIVE Final     Scheduled Meds: . albuterol  2.5 mg Nebulization TID  . atenolol  100 mg Oral Daily  . heparin subcutaneous  5,000 Units Subcutaneous 3 times per day  . pantoprazole  40 mg Oral QHS  . piperacillin-tazobactam (ZOSYN)  IV  3.375 g Intravenous Q8H   Continuous Infusions: . sodium chloride    . dextrose 5 % and 0.9 % NaCl with KCl 20 mEq/L 75 mL/hr at  08/08/14 0218  . lactated ringers 1,000 mL (08/06/14 0854)  . lactated ringers       Jesly Hartmann, DO  [facility] Pager [phone]  If 7PM-7AM, please contact night-coverage [email or web address] Password [email or web address] 08/08/2014, 12:52 PM   LOS: 11 days

## 2014-08-09 ENCOUNTER — Encounter (HOSPITAL_COMMUNITY): Payer: Self-pay | Admitting: Gastroenterology

## 2014-08-09 ENCOUNTER — Telehealth: Payer: Self-pay | Admitting: *Deleted

## 2014-08-09 ENCOUNTER — Other Ambulatory Visit: Payer: Self-pay

## 2014-08-09 DIAGNOSIS — R0600 Dyspnea, unspecified: Secondary | ICD-10-CM

## 2014-08-09 LAB — BASIC METABOLIC PANEL
Anion gap: 6 (ref 5–15)
BUN: 11 mg/dL (ref 6–23)
CO2: 24 mmol/L (ref 19–32)
Calcium: 7.2 mg/dL — ABNORMAL LOW (ref 8.4–10.5)
Chloride: 105 mmol/L (ref 96–112)
Creatinine, Ser: 0.57 mg/dL (ref 0.50–1.35)
GFR calc non Af Amer: >90 mL/min (ref >90)
Glucose, Bld: 101 mg/dL — ABNORMAL HIGH (ref 70–99)
POTASSIUM: 3.3 mmol/L — AB (ref 3.5–5.1)
Sodium: 135 mmol/L (ref 135–145)

## 2014-08-09 LAB — CBC
HEMATOCRIT: 24.6 % — AB (ref 39.0–52.0)
Hemoglobin: 7.5 g/dL — ABNORMAL LOW (ref 13.0–17.0)
MCH: 24.1 pg — AB (ref 26.0–34.0)
MCHC: 30.5 g/dL (ref 30.0–36.0)
MCV: 79.1 fL (ref 78.0–100.0)
PLATELETS: 456 10*3/uL — AB (ref 150–400)
RBC: 3.11 MIL/uL — ABNORMAL LOW (ref 4.22–5.81)
RDW: 19.7 % — AB (ref 11.5–15.5)
WBC: 10.4 10*3/uL (ref 4.0–10.5)

## 2014-08-09 LAB — URINE CULTURE
CULTURE: NO GROWTH
Colony Count: NO GROWTH

## 2014-08-09 LAB — BRAIN NATRIURETIC PEPTIDE: B Natriuretic Peptide: 180.7 pg/mL — ABNORMAL HIGH (ref 0.0–100.0)

## 2014-08-09 MED ORDER — POTASSIUM CHLORIDE CRYS ER 20 MEQ PO TBCR
20.0000 meq | EXTENDED_RELEASE_TABLET | Freq: Once | ORAL | Status: AC
  Administered 2014-08-09: 20 meq via ORAL
  Filled 2014-08-09: qty 1

## 2014-08-09 MED ORDER — ELDERTONIC PO ELIX
15.0000 mL | ORAL_SOLUTION | Freq: Every day | ORAL | Status: DC
  Filled 2014-08-09: qty 15

## 2014-08-09 MED ORDER — ADULT MULTIVITAMIN LIQUID CH
5.0000 mL | Freq: Every day | ORAL | Status: DC
  Administered 2014-08-09 – 2014-08-11 (×3): 5 mL via ORAL
  Filled 2014-08-09 (×7): qty 5

## 2014-08-09 NOTE — Progress Notes (Signed)
Physical Therapy Treatment Patient Details Name: BURDELL PEED MRN: [id number] DOB: 04-26-1956 Today's Date: 08/09/2014    History of Present Illness 59 yo male s/p exp lap, colectomy, cholecystectomy 07/30/14. Hx of HTN, anemia.     PT Comments    Progressing with mobility. Remained on [place] O2 during session. Pt tolerated increased distance well.   Follow Up Recommendations  Home health PT     Equipment Recommendations  Rolling walker with 5" wheels (will continue to assess)    Recommendations for Other Services OT consult     Precautions / Restrictions Precautions Precautions: Fall Precaution Comments: multiple lines/leads/O2 Restrictions Weight Bearing Restrictions: No    Mobility  Bed Mobility               General bed mobility comments: pt OOB in recliner  Transfers Overall transfer level: Needs assistance Equipment used: Rolling walker (2 wheeled) Transfers: Sit to/from Stand Sit to Stand: Min guard         General transfer comment: clsoe guard for safety. VCS hand placement  Ambulation/Gait Ambulation/Gait assistance: Min guard Ambulation Distance (Feet): 500 Feet Assistive device: Rolling walker (2 wheeled) Gait Pattern/deviations: Trunk flexed;Decreased stride length;Step-through pattern     General Gait Details: Remained on [place] O2. Dyspnea 2/4. Pt tolerated increased distance well.    Stairs            Wheelchair Mobility    Modified Rankin (Stroke Patients Only)       Balance                                    Cognition Arousal/Alertness: Awake/alert Behavior During Therapy: WFL for tasks assessed/performed Overall Cognitive Status: Within Functional Limits for tasks assessed                      Exercises      General Comments        Pertinent Vitals/Pain Pain Assessment: No/denies pain    Home Living                      Prior Function            PT Goals (current goals can now  be found in the care plan section) Progress towards PT goals: Progressing toward goals    Frequency  Min 3X/week    PT Plan Current plan remains appropriate    Co-evaluation             End of Session Equipment Utilized During Treatment: Oxygen Activity Tolerance: Patient tolerated treatment well Patient left: in chair;with call bell/phone within reach;with family/visitor present     Time: 1227-1242 PT Time Calculation (min) (ACUTE ONLY): 15 min  Charges:  $Gait Training: 8-22 mins                    G Codes:      Weston Anna, MPT Pager: [phone]

## 2014-08-09 NOTE — Progress Notes (Signed)
PROGRESS NOTE  Donald Fry FTD:[id number] DOB: 1955/07/27 DOA: 07/28/2014 PCP: [name], MD  Brief History 59 y.o. male with a PMH of hyperlipidemia, anemia, hypertension and tobacco abuse who was admitted 07/28/14 with right upper quadrant abdominal pain for couple of days prior to this admission. Patient also reported having ongoing dyspnea on exertion for several months prior to the admission and unintentional weight loss. Evaluation on the admission included CT abdomen which showed a possible colon cancer near the hepatic flexure with surrounding lymphadenopathy. Patient underwent colonoscopy 07/29/2014 followed by surgical resection of the colon cancer 07/30/2014. Postoperative course has been complicated with bile leak and healthcare associated pneumonia. Additionally, JP on 08/05/14 with bile color and has increased in amount drainge so plan is for ERCP 08/06/14.  Interim  -Today, patient feels better, but c/o nausea without emesis.  Denies headache,  f/c, cp, sob, cough.  Abdominal pain is controlled. 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  - Colon mass confirmed by colonoscopy on 07/29/2014. Bleeding thought to be secondary to this large colon mass. - During this hospitalization, patient has received 4 units of PRBC blood transfusion. He reports no bleeding since. - Hemoglobin 7.5 on 08/09/2014.  -Hgb has largely remained stable; Baseline Hgb 7-8  New Fever -102.33F@1320  on 08/08/14 -check CXR--no new infiltrates -blood cultures x 2 sets--pending -UA--no pyuria -08/08/14--Cdiff-- NEG -JP drain--20cc in past 24 hrs -check lactic acid--1.3 -d/c zosyn--started imipenem 08/08/14 -start vancomycin 08/08/14 -continue abx pending culture data  Active problems: Colonic mass / colon cancer  (adenocarcinoma) -Right colon tumor, peritoneal implants and right upper quadrant, right lateral abdominal wall, and on gallbladder, cholelithiasis - Status post colonoscopy on 07/29/2014  -followed by exploratory laparotomy, extended right colectomy, biopsy of peritoneal nodules and cholecystectomy on 07/30/14. 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 -Patient will follow-up with oncology per scheduled appointment on discharge. -has appt with Dr. Benay Spice on 08/12/14  Bile leak -08/05/14--pt had increase bile color drainage from JP again - HIDA showed bile leak.  - 08/06/14 ERCP (Dr. Boykin Peek leak from biliary system-->stent placed -JP drain--20cc in past 24 hrs  Leukocytosis / Acute respiratory failure with hypoxia / HCAP / bronchospasm - Patient on empiric Zosyn on admission given rising WBC and possibility of microperforation from colonic mass. - Antibiotics were discontinued post surgery but because of significant jump in WBC 08/01/14 Zosyn was resumed. Chest x-ray 2/28 showed possible pneumonia. - had completed 7 days of zosyn on 08/07/14 -clinically improving/stable on 2L [place]-->98% saturation  Essential hypertension  - BP at goal, continue atenolol.  Hyperlipidemia  - Statin on hold until PO intake improves.  Anxiety  - Continue Xanax.  Tobacco abuse/COPD - Continue nicotine patch. -at least 30 pack year hx  Hypokalemia  - Secondary to GI losses. Supplemented. - Check BMP in am.  Hx of ETOH use quit 4 years ago DVT Prophylaxis  - Heparin subcutaneous ordered.  Procedures/Studies: Dg Chest 2 View  08/08/2014   CLINICAL DATA:  59 year old male with shortness of breath, tachypnea and weakness for several months. Right colectomy on 07/30/2014 for colon cancer.  EXAM: CHEST  2 VIEW  COMPARISON:  08/01/2014 and prior radiographs dating back to 07/25/2014  FINDINGS: Cardiomediastinal silhouette is unchanged.  Decreased bilateral  interstitial and  airspace opacities noted.  Bibasilar opacities again noted, improved on the right.  This is a low volume film.  There is no evidence of pneumothorax or acute bony abnormality.  Small bilateral pleural effusions are again identified.  IMPRESSION: Decreased bilateral interstitial and airspace opacities which may represent improved edema or infection.  Bibasilar opacities again noted, improved on the right.  Small bilateral pleural effusions again identified.   Electronically Signed   By: Margarette Canada M.D.   On: 08/08/2014 17:11   Dg Chest 2 View  07/25/2014   CLINICAL DATA:  Acute shortness of breath with right chest and rib pain for 1 week. lifting injury.  EXAM: CHEST  2 VIEW  COMPARISON:  None.  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 Normal heart size and vascularity. Minor central bronchitic change without focal pneumonia, collapse or consolidation. No edema, effusion or pneumothorax. Trachea midline. No acute osseous finding.  IMPRESSION: Mild central bronchitic change.  Anterior eventration of the right hemidiaphragm  No superimposed acute process.   Electronically Signed   By: Jerilynn Mages.  Shick M.D.   On: 07/25/2014 09:03   Nm Hepatobiliary Including Gb  08/02/2014   CLINICAL DATA:  Status post cholecystectomy 07/30/2014  EXAM: NUCLEAR MEDICINE HEPATOBILIARY IMAGING  TECHNIQUE: Sequential images of the abdomen were obtained out to 60 minutes following intravenous administration of radiopharmaceutical.  RADIOPHARMACEUTICALS:  5.5 [employer] QV-95G Choletec  COMPARISON:  None.  FINDINGS: Flow study shows uniform distribution of radioactivity in the liver. Sequential imaging of the liver and abdomen over a period of 60 minutes shows uniform uptake of the tracer in the liver.  Prior cholecystectomy. Filling of the common bile duct. Radiotracer uptake is present in the small bowel at 20 minutes. There is abnormal radiotracer collection adjacent to the inferior margin of the  left hepatic lobe most concerning for a bile leak.  At the end of 1-hour, there is relatively poor clearance of activity from the liver.  IMPRESSION: 1. Abnormal radiotracer collection adjacent to the inferior margin of the left hepatic lobe most concerning for a bile leak. These results were called by telephone at the time of interpretation on 08/02/2014 at 12:01 pm to Dr. Jacquelynn Cree , who verbally acknowledged these results.   Electronically Signed   By: Kathreen Devoid   On: 08/02/2014 12:18   US Abdomen Complete  07/25/2014   CLINICAL DATA:  Acute right upper quadrant pain for 1 week.  EXAM: ULTRASOUND ABDOMEN COMPLETE  COMPARISON:  None.  FINDINGS: Gallbladder: Intraluminal echogenic mobile abnormalities within the gallbladder without shadowing measure 1 cm in greatest diameter. These likely represent non shadowing stones. Normal wall thickness measuring 2.1 mm. No significant Murphy's sign. Small amount of free fluid along the right liver and gallbladder in the right upper quadrant remains nonspecific.  Common bile duct: Diameter: 2.8 mm  Liver: There are scattered small hypo echoic probable cysts throughout the liver. Left hepatic lobe anterior lateral hypoechoic minimally complex cyst measures 1.4 x 1.1 x 1.1 cm. Patent portal vein with normal hepatopetal flow. No biliary dilatation.  Right hepatic lobe hypoechoic cyst along the lateral inferior margin measures 12 x 13 x 14 mm.  IVC: Not visualized, obscured by bowel gas  Pancreas: Not visualized, obscured by bowel gas  Spleen: Limited visualization. 5.7 cm length. No definite abnormality.  Right Kidney: Length: 10.9 cm. Echogenicity within normal limits. No mass or hydronephrosis visualized.  Left Kidney: Length: 11.9 cm. Echogenicity within normal limits. No mass or hydronephrosis visualized.  Abdominal aorta: No aneurysm visualized.  Other findings: None.  IMPRESSION: Probable non shadowing gallstones. No definite wall thickening or significant  Murphy's sign.  Small amount of free fluid along the right liver margin and gallbladder remains nonspecific.  Obscured IVC, pancreas, and spleen by bowel gas.  Scattered hepatic cysts   Electronically Signed   By: Jerilynn Mages.  Shick M.D.   On: 07/25/2014 13:28   Ct Abdomen Pelvis W Contrast  07/28/2014   ADDENDUM REPORT: 07/28/2014 11:16  ADDENDUM: Study discussed by telephone with Dr. Quintella Reichert on 07/28/2014 at 1105 hours.   Electronically Signed   By: Genevie Ann M.D.   On: 07/28/2014 11:16   07/28/2014   CLINICAL DATA:  59 year old male with 2 weeks of right side abdominal pain. Recently diagnosed with gallstones. Initial encounter.  EXAM: CT ABDOMEN AND PELVIS WITH CONTRAST  TECHNIQUE: Multidetector CT imaging of the abdomen and pelvis was performed using the standard protocol following bolus administration of intravenous contrast.  CONTRAST:  48mL OMNIPAQUE IOHEXOL 300 MG/ML SOLN, 14mL OMNIPAQUE IOHEXOL 300 MG/ML SOLN, 174mL OMNIPAQUE IOHEXOL 300 MG/ML SOLN  COMPARISON:  Abdomen ultrasound 07/25/2014.  FINDINGS: Minor atelectasis at the right lung base. No pericardial or pleural effusion.  No acute osseous abnormality identified.  No pelvic free fluid. Retained stool in the rectum. Negative bladder. Moderately redundant sigmoid colon with retained stool. Negative left colon. Moderately redundant splenic flexure.  Abnormal hepatic flexure with elongated circumferential wall thickening and luminal narrowing, but also extensive surrounding hepatic flexure mesentery nodularity and irregular intermediate density. Discrete rounded adjacent mesenteric nodes measuring up to 9 mm diameter are present.  The abnormal pericolic fat continues to the gallbladder fossa. However, the gallbladder itself does not appear distended or definitely inflamed. There is cholelithiasis evident at the gallbladder fundus.  The upstream ascending colon and cecum than have a more normal appearance. There is a gas-filled laterally situated  appendix which tracks cephalad. The terminal ileum is within normal limits. Oral contrast has not yet reached the distal small bowel. No dilated small bowel loops. Negative stomach. The second portion of the duodenum also is in close proximity to the abnormal hepatic flexure.  Trace perihepatic free fluid. Multiple nonspecific small low-density lesions in liver such as the 14 mm right hepatic lobe entity on series 2, image 27. Also 8 mm lesion in the left lobe on image 27. Larger 14 mm lesion in the lateral left lobe on image 33.  Spleen, pancreas, right adrenal gland, portal venous system, and kidneys are within normal limits.  There is intermediate density nodularity adjacent to the medial lobe of the left adrenal gland which is nonspecific. To a degree this resembles an enlarged retroperitoneal lymph node. There is a similar prominent node situated between the cava and right corona of the diaphragm measuring 6 mm.  No pneumoperitoneum. Aortoiliac calcified atherosclerosis noted. Major arterial structures in the abdomen and pelvis are patent.  IMPRESSION: 1. Markedly abnormal hepatic flexure with wall thickening along a 10-12 cm length but extensive surrounding mesenteric nodularity, and increased regional lymph nodes. The appearance is atypical for acute colitis and highly suspicious for adenocarcinoma. GI consultation for followup colonoscopy recommended. 2. Small volume perihepatic fluid. Multiple up to 14 mm indeterminate low-density lesions in the liver. Several prominent but indeterminate retroperitoneal lymph nodes in the upper abdomen. 3. Abnormal fat stranding/nodularity continues to the gallbladder fossa, but the gallbladder does not appear inflamed.  Electronically Signed: By: Genevie Ann M.D. On: 07/28/2014 11:00   Dg  Chest Port 1 View  08/01/2014   CLINICAL DATA:  Postoperative bile leak.  Hypertension.  EXAM: PORTABLE CHEST - 1 VIEW  COMPARISON:  July 30, 2014.  FINDINGS: Stable cardiomediastinal  silhouette. Nasogastric tube is seen entering stomach. Right internal jugular catheter is stable in position with tip overlying expected position of the SVC. No pneumothorax is noted. Increased left perihilar opacity is noted concerning for pneumonia. Significantly increased opacity is noted in the right middle lobe concerning for pneumonia. Right upper lobe opacity is also noted concerning for pneumonia. No significant pleural effusion is noted. Bony thorax is intact.  IMPRESSION: Increased bilateral upper lobe and right middle lobe opacities are noted most consistent with pneumonia or possibly edema. Followup radiographs are recommended.   Electronically Signed   By: Marijo Conception, M.D.   On: 08/01/2014 10:35   Dg Chest Port 1 View  07/30/2014   CLINICAL DATA:  Evaluate PICC line placement  EXAM: PORTABLE CHEST - 1 VIEW  COMPARISON:  07/25/2014  FINDINGS: Low lung volumes. Mild right basilar atelectasis. No focal consolidation. No pleural effusion or pneumothorax.  The heart is normal in size.  Right IJ venous catheter terminates in the mid SVC.  Enteric tube courses into the proximal stomach.  Cholecystectomy clips.  IMPRESSION: Right IJ venous catheter terminates in the mid SVC. No pneumothorax.   Electronically Signed   By: Julian Hy M.D.   On: 07/30/2014 18:34   Dg Ercp  08/06/2014   CLINICAL DATA:  Bile leak  EXAM: ERCP  TECHNIQUE: Multiple spot images obtained with the fluoroscopic device and submitted for interpretation post-procedure.  COMPARISON:  None.  FINDINGS: Images demonstrate cannulation of the common bile duct and contrast filling the biliary tree. A biliary stent is present across the distal common bile duct.  IMPRESSION: Biliary stent placement.  These images were submitted for radiologic interpretation only. Please see the procedural report for the amount of contrast and the fluoroscopy time utilized.   Electronically Signed   By: Marybelle Killings M.D.   On: 08/06/2014 12:46          Subjective:   Objective: Filed Vitals:   08/09/14 0444 08/09/14 0554 08/09/14 0838 08/09/14 0854  BP: 121/56     Pulse: 79     Temp: 100.4 F (38 C) 98.8 F (37.1 C)  98.5 F (36.9 C)  TempSrc: Oral Oral  Oral  Resp: 24     Height:      Weight:      SpO2: 95%  97%     Intake/Output Summary (Last 24 hours) at 08/09/14 1246 Last data filed at 08/09/14 0600  Gross per 24 hour  Intake    750 ml  Output    710 ml  Net     40 ml   Weight change:  Exam:   General:  Pt is alert, follows commands appropriately, not in acute distress  HEENT: No icterus, No thrush, English/AT  Cardiovascular: RRR, S1/S2, no rubs, no gallops  Respiratory: bibasilar rales, no wheeze  Abdomen: Soft/+BS, non tender, non distended, no guarding  Extremities: No edema, No lymphangitis, No petechiae, No rashes, no synovitis  Data Reviewed: Basic Metabolic Panel:  Recent Labs Lab 08/03/14 0530 08/04/14 0510 08/08/14 0515 08/09/14 0440  NA 142 144 141 135  K 4.0 3.9 3.8 3.3*  CL 112 112 110 105  CO2 25 26 22 24   GLUCOSE 128* 121* 105* 101*  BUN 11 12 10 11   CREATININE 0.54 0.44*  0.44* 0.57  CALCIUM 7.5* 7.9* 7.8* 7.2*   Liver Function Tests:  Recent Labs Lab 08/03/14 0530  AST 18  ALT 17  ALKPHOS 93  BILITOT 1.4*  PROT 5.3*  ALBUMIN 1.9*    Recent Labs Lab 08/04/14 0510  AMYLASE 26   No results for input(s): AMMONIA in the last 168 hours. CBC:  Recent Labs Lab 08/03/14 0530 08/04/14 0510 08/07/14 0408 08/08/14 0515 08/09/14 0440  WBC 11.3* 9.5 8.6 11.5* 10.4  NEUTROABS  --   --  6.7  --   --   HGB 7.7* 7.7* 7.5* 7.5* 7.5*  HCT 25.1* 24.7* 25.2* 24.7* 24.6*  MCV 80.7 80.5 81.0 80.7 79.1  PLT 333 340 447* 567* 456*   Cardiac Enzymes: No results for input(s): CKTOTAL, CKMB, CKMBINDEX, TROPONINI in the last 168 hours. BNP: Invalid input(s): POCBNP CBG: No results for input(s): GLUCAP in the last 168 hours.  Recent Results (from the past 240  hour(s))  MRSA PCR Screening     Status: None   Collection Time: 08/03/14 11:19 PM  Result Value Ref Range Status   MRSA by PCR NEGATIVE NEGATIVE Final    Comment:        The GeneXpert MRSA Assay (FDA approved for NASAL specimens only), is one component of a comprehensive MRSA colonization surveillance program. It is not intended to diagnose MRSA infection nor to guide or monitor treatment for MRSA infections.   Clostridium Difficile by PCR     Status: None   Collection Time: 08/08/14  6:06 AM  Result Value Ref Range Status   C difficile by pcr NEGATIVE NEGATIVE Final     Scheduled Meds: . albuterol  2.5 mg Nebulization BID  . atenolol  100 mg Oral Daily  . heparin subcutaneous  5,000 Units Subcutaneous 3 times per day  . imipenem-cilastatin  500 mg Intravenous Q6H  . pantoprazole  40 mg Oral QHS  . vancomycin  750 mg Intravenous Q8H   Continuous Infusions:    Jovan Schickling, DO  [facility] Pager [phone]  If 7PM-7AM, please contact night-coverage [email or web address] Password [email or web address] 08/09/2014, 12:46 PM   LOS: 12 days

## 2014-08-09 NOTE — Progress Notes (Signed)
3 Days Post-Op  Subjective: He says he felt terrible all day yesterday.  Having some diarrhea, but not allot, didn't eat anything yesterday because he felt so bad.  Not using IS or flutter valve.  15 times per day. His wound looks good.  Not much in drain, it is a little green and a little cloudy.   He is complaining but looks and feels better. Objective: Vital signs in last 24 hours: Temp:  [98.5 F (36.9 C)-102.9 F (39.4 C)] 98.5 F (36.9 C) (03/07 0854) Pulse Rate:  [70-89] 79 (03/07 0444) Resp:  [20-24] 24 (03/07 0444) BP: (113-129)/(54-64) 121/56 mmHg (03/07 0444) SpO2:  [91 %-99 %] 97 % (03/07 0838) Last BM Date: 08/08/14 10 from drain PO nothing recorded. Fever yesterday up to 102.9.  Last temp at 2100 hours 101, no temp recorded since. K+ 3.3, BNP OK WBC 10.4 H/H 7.5/24.6 stable  CXR:  Decreased bilateral interstitial and airspace opacities which may represent improved edema or infection.  Bibasilar opacities again noted, improved on the right.  Small bilateral pleural effusions again identified. Regular diet   Current VS:  123/57, SAT 97% R/A, P 88 Intake/Output from previous day: 03/06 0701 - 03/07 0700 In: 750 [IV Piggyback:750] Out: 710 [Urine:700; Drains:10] Intake/Output this shift:    General appearance: alert, cooperative, no distress and tired. Resp: decrease in the bases,with rales both bases. Cardio: regular rate and rhythm and rate in in the upper 80's at rest. GI: soft, + BS, wound looks fine, drain intact, light greenish tinge,and it is cloudy. Having loose stools.  Lab Results:   Recent Labs  08/08/14 0515 08/09/14 0440  WBC 11.5* 10.4  HGB 7.5* 7.5*  HCT 24.7* 24.6*  PLT 567* 456*    BMET  Recent Labs  08/08/14 0515 08/09/14 0440  NA 141 135  K 3.8 3.3*  CL 110 105  CO2 22 24  GLUCOSE 105* 101*  BUN 10 11  CREATININE 0.44* 0.57  CALCIUM 7.8* 7.2*   PT/INR No results for input(s): LABPROT, INR in the last 72 hours.   Recent  Labs Lab 08/03/14 0530  AST 18  ALT 17  ALKPHOS 93  BILITOT 1.4*  PROT 5.3*  ALBUMIN 1.9*     Lipase     Component Value Date/Time   LIPASE 20 07/28/2014 0830     Studies/Results: Dg Chest 2 View  08/08/2014   CLINICAL DATA:  59 year old male with shortness of breath, tachypnea and weakness for several months. Right colectomy on 07/30/2014 for colon cancer.  EXAM: CHEST  2 VIEW  COMPARISON:  08/01/2014 and prior radiographs dating back to 07/25/2014  FINDINGS: Cardiomediastinal silhouette is unchanged.  Decreased bilateral interstitial and airspace opacities noted.  Bibasilar opacities again noted, improved on the right.  This is a low volume film.  There is no evidence of pneumothorax or acute bony abnormality.  Small bilateral pleural effusions are again identified.  IMPRESSION: Decreased bilateral interstitial and airspace opacities which may represent improved edema or infection.  Bibasilar opacities again noted, improved on the right.  Small bilateral pleural effusions again identified.   Electronically Signed   By: Donald Fry M.D.   On: 08/08/2014 17:11    Medications: . albuterol  2.5 mg Nebulization BID  . atenolol  100 mg Oral Daily  . heparin subcutaneous  5,000 Units Subcutaneous 3 times per day  . imipenem-cilastatin  500 mg Intravenous Q6H  . pantoprazole  40 mg Oral QHS  . vancomycin  750  mg Intravenous Q8H   Scheduled Meds: . albuterol  2.5 mg Nebulization BID  . atenolol  100 mg Oral Daily  . heparin subcutaneous  5,000 Units Subcutaneous 3 times per day  . imipenem-cilastatin  500 mg Intravenous Q6H  . pantoprazole  40 mg Oral QHS  . vancomycin  750 mg Intravenous Q8H     1. Gallbladder - CHRONIC CHOLECYSTITIS AND CHOLELITHIASIS. 2. 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 3. Colon, segmental resection for tumor, right and distal ileum - INVASIVE POORLY DIFFERENTIATED ADENOCARCINOMA, WITH SIGNET RING CELL FEATURES AND TUMOR NECROSIS, INVADING THOUGH THE MUSCULARIS PROPRIA INTO PERICOLONIC FATTY TISSUE, EXTENDING TO THE SEROSA WITH EXTENSIVE ANGIOLYMPHATIC INVASION AND PERINEURAL INVASION PRESENT. - MULTIPLE EXTRAMURAL SATELLITE TUMOR NODULES. - APPENDIX: APPENDICEAL TISSUE, NO EVIDENCE OF ACTIVE INFLAMMATION OR NEOPLASM. - ELEVEN OF SEVENTEEN LYMPH NODES, POSITIVE FOR METASTATIC CARCINOMA (11/17). Microscopic Comment   Assessment/Plan 1. Right colon tumor, peritoneal implants and right upper quadrant, right lateral abdominal wall, and on gallbladder, cholelithiasis 1A. Laparoscopy converted to exploratory laparotomy, extended right colectomy, biopsy of peritoneal nodules on diaphragm with closure of diaphragm, cholecystectomy, POD 8 Dr. Zella Fry. 1B. Bile leak, no obvious leak seen on ERCP 08/06/14, with stent placement by Dr. Deatra Fry. -start clear liquids, hopefully can advance tomorrow 2. Anemia/Leukocytosis 3. Hypertension 4. Tobacco use 39 years 5. Hx of ETOH use quit 4 years ago 6. Dyslipidemia  7. DVT prophylaxis - SCD/heparin 8. He has had 9 days of Zosyn since admit and is now on Vancomycin and cefotetan, this will be day 2.   9. Chronic benzodiazapine use - very anxious    PLAN:  I am not sure where his fever came from yesterday, blood cultures are pending.  Fever is currently resolved. Dr. Carles Fry has fever work up in progress.  If the diarrhea persist we could get a C Diff, he has been on antibiotics for 10 day, but right now it does not sound like C diff.  He only had about 4 loose stools yesterday.  I told him again to do IS q1h and the flutter valve.  Work on mobilizing him.  He said he was to sick to walk yesterday.       LOS: 12 days    Donald Fry 08/09/2014

## 2014-08-09 NOTE — Progress Notes (Signed)
  Echocardiogram 2D Echocardiogram has been performed.  Donald Fry 08/09/2014, 10:59 AM

## 2014-08-09 NOTE — Telephone Encounter (Signed)
This RN spoke with Angie, step daughter, who was asking several questions regarding 08/12/14 appointment with Dr. Benay Spice. Per Dr. Benay Spice, he will round on the patient at [facility], room # [phone], tomorrow 08/10/14 between 7 -8 am since, he is on - call. At that visit, he will decide if he needs to see the patient on 3/10 in the office. This message was left on Angie's cell phone. Cell # is [phone]. Instructed Angie to call [phone] if she had any further questions.

## 2014-08-10 ENCOUNTER — Inpatient Hospital Stay (HOSPITAL_COMMUNITY): Payer: Medicaid Other

## 2014-08-10 ENCOUNTER — Other Ambulatory Visit: Payer: Self-pay | Admitting: *Deleted

## 2014-08-10 ENCOUNTER — Telehealth: Payer: Self-pay | Admitting: *Deleted

## 2014-08-10 DIAGNOSIS — D509 Iron deficiency anemia, unspecified: Secondary | ICD-10-CM

## 2014-08-10 DIAGNOSIS — C183 Malignant neoplasm of hepatic flexure: Secondary | ICD-10-CM

## 2014-08-10 DIAGNOSIS — F1021 Alcohol dependence, in remission: Secondary | ICD-10-CM

## 2014-08-10 DIAGNOSIS — K769 Liver disease, unspecified: Secondary | ICD-10-CM

## 2014-08-10 DIAGNOSIS — Z803 Family history of malignant neoplasm of breast: Secondary | ICD-10-CM

## 2014-08-10 DIAGNOSIS — C184 Malignant neoplasm of transverse colon: Secondary | ICD-10-CM

## 2014-08-10 DIAGNOSIS — C779 Secondary and unspecified malignant neoplasm of lymph node, unspecified: Secondary | ICD-10-CM

## 2014-08-10 DIAGNOSIS — Z801 Family history of malignant neoplasm of trachea, bronchus and lung: Secondary | ICD-10-CM

## 2014-08-10 DIAGNOSIS — Z87891 Personal history of nicotine dependence: Secondary | ICD-10-CM

## 2014-08-10 LAB — BASIC METABOLIC PANEL
ANION GAP: 4 — AB (ref 5–15)
BUN: 14 mg/dL (ref 6–23)
CALCIUM: 7.4 mg/dL — AB (ref 8.4–10.5)
CO2: 26 mmol/L (ref 19–32)
CREATININE: 0.49 mg/dL — AB (ref 0.50–1.35)
Chloride: 108 mmol/L (ref 96–112)
Glucose, Bld: 88 mg/dL (ref 70–99)
Potassium: 3.4 mmol/L — ABNORMAL LOW (ref 3.5–5.1)
Sodium: 138 mmol/L (ref 135–145)

## 2014-08-10 LAB — HEPATIC FUNCTION PANEL
ALK PHOS: 139 U/L — AB (ref 39–117)
ALT: 23 U/L (ref 0–53)
AST: 19 U/L (ref 0–37)
Albumin: 1.7 g/dL — ABNORMAL LOW (ref 3.5–5.2)
BILIRUBIN DIRECT: 0.2 mg/dL (ref 0.0–0.5)
BILIRUBIN TOTAL: 0.7 mg/dL (ref 0.3–1.2)
Indirect Bilirubin: 0.5 mg/dL (ref 0.3–0.9)
TOTAL PROTEIN: 5.4 g/dL — AB (ref 6.0–8.3)

## 2014-08-10 LAB — CBC
HCT: 25.7 % — ABNORMAL LOW (ref 39.0–52.0)
Hemoglobin: 7.7 g/dL — ABNORMAL LOW (ref 13.0–17.0)
MCH: 23.9 pg — ABNORMAL LOW (ref 26.0–34.0)
MCHC: 30 g/dL (ref 30.0–36.0)
MCV: 79.8 fL (ref 78.0–100.0)
PLATELETS: 451 10*3/uL — AB (ref 150–400)
RBC: 3.22 MIL/uL — AB (ref 4.22–5.81)
RDW: 19.6 % — ABNORMAL HIGH (ref 11.5–15.5)
WBC: 8.1 10*3/uL (ref 4.0–10.5)

## 2014-08-10 LAB — VANCOMYCIN, TROUGH: Vancomycin Tr: 7 ug/mL — ABNORMAL LOW (ref 10.0–20.0)

## 2014-08-10 LAB — MAGNESIUM: Magnesium: 2.1 mg/dL (ref 1.5–2.5)

## 2014-08-10 MED ORDER — ZOLPIDEM TARTRATE 5 MG PO TABS
5.0000 mg | ORAL_TABLET | Freq: Once | ORAL | Status: AC
  Administered 2014-08-10: 5 mg via ORAL
  Filled 2014-08-10: qty 1

## 2014-08-10 MED ORDER — DIPHENHYDRAMINE HCL 50 MG/ML IJ SOLN
25.0000 mg | Freq: Once | INTRAMUSCULAR | Status: AC
  Administered 2014-08-10: 25 mg via INTRAVENOUS
  Filled 2014-08-10: qty 1

## 2014-08-10 MED ORDER — IOHEXOL 300 MG/ML  SOLN
100.0000 mL | Freq: Once | INTRAMUSCULAR | Status: AC | PRN
  Administered 2014-08-10: 100 mL via INTRAVENOUS

## 2014-08-10 MED ORDER — VANCOMYCIN HCL 10 G IV SOLR
1500.0000 mg | Freq: Once | INTRAVENOUS | Status: AC
  Administered 2014-08-10: 1500 mg via INTRAVENOUS
  Filled 2014-08-10: qty 1500

## 2014-08-10 MED ORDER — VANCOMYCIN HCL IN DEXTROSE 1-5 GM/200ML-% IV SOLN
1000.0000 mg | Freq: Three times a day (TID) | INTRAVENOUS | Status: DC
  Administered 2014-08-10 – 2014-08-12 (×6): 1000 mg via INTRAVENOUS
  Filled 2014-08-10 (×7): qty 200

## 2014-08-10 NOTE — Telephone Encounter (Signed)
Met with patient and wife in hospital. Explained the role of the GI Nurse Navigator and provided New Patient Packet with information on: 1.  Colorectal cancer & porta cath 2. Support groups 3. Advanced Directives 4. Fall Safety Plan Answered questions, provided appointment for 08/26/14 with Dr. Benay Spice and provided emotional support. Encouraged him to work on getting more active and advancing his diet as MD ordered to regain his strength. Merceda Elks, RN, BSN GI Oncology [place]

## 2014-08-10 NOTE — Progress Notes (Signed)
ANTIBIOTIC CONSULT NOTE - FOLLOW UP  Pharmacy Consult for vancomycin Indication: sepsis  No Known Allergies  Patient Measurements: Height: 5\' 9"  (175.3 cm) Weight: 163 lb (73.936 kg) IBW/kg (Calculated) : 70.7 Adjusted Body Weight:   Vital Signs: Temp: 97.6 F (36.4 C) (03/08 0453) Temp Source: Oral (03/08 0453) BP: 115/69 mmHg (03/08 0453) Pulse Rate: 63 (03/08 0453) Intake/Output from previous day: 03/07 0701 - 03/08 0700 In: 60 [P.O.:60] Out: 320 [Urine:300; Drains:20] Intake/Output from this shift:    Labs:  Recent Labs  08/08/14 0515 08/09/14 0440 08/10/14 0435  WBC 11.5* 10.4 8.1  HGB 7.5* 7.5* 7.7*  PLT 567* 456* 451*  CREATININE 0.44* 0.57 0.49*   Estimated Creatinine Clearance: 98.2 mL/min (by C-G formula based on Cr of 0.49).  Recent Labs  08/10/14 0435  VANCOTROUGH 7.0*     Microbiology: Recent Results (from the past 720 hour(s))  MRSA PCR Screening     Status: None   Collection Time: 08/03/14 11:19 PM  Result Value Ref Range Status   MRSA by PCR NEGATIVE NEGATIVE Final    Comment:        The GeneXpert MRSA Assay (FDA approved for NASAL specimens only), is one component of a comprehensive MRSA colonization surveillance program. It is not intended to diagnose MRSA infection nor to guide or monitor treatment for MRSA infections.   Clostridium Difficile by PCR     Status: None   Collection Time: 08/08/14  6:06 AM  Result Value Ref Range Status   C difficile by pcr NEGATIVE NEGATIVE Final  Urine culture     Status: None   Collection Time: 08/08/14  5:31 PM  Result Value Ref Range Status   Specimen Description URINE, CLEAN CATCH  Final   Special Requests NONE  Final   Colony Count NO GROWTH Performed at [facility]   Final   Culture NO GROWTH Performed at [facility]   Final   Report Status 08/09/2014 FINAL  Final    Anti-infectives    Start     Dose/Rate Route Frequency Ordered Stop   08/10/14 1400  vancomycin  (VANCOCIN) IVPB 1000 mg/200 mL premix     1,000 mg 200 mL/hr over 60 Minutes Intravenous Every 8 hours 08/10/14 0608     08/10/14 0615  vancomycin (VANCOCIN) 1,500 mg in sodium chloride 0.9 % 500 mL IVPB     1,500 mg 250 mL/hr over 120 Minutes Intravenous  Once 08/10/14 0607     08/08/14 1500  imipenem-cilastatin (PRIMAXIN) 500 mg in sodium chloride 0.9 % 100 mL IVPB     500 mg 200 mL/hr over 30 Minutes Intravenous Every 6 hours 08/08/14 1348     08/08/14 1400  imipenem-cilastatin (PRIMAXIN) 500 mg in sodium chloride 0.9 % 100 mL IVPB  Status:  Discontinued     500 mg 200 mL/hr over 30 Minutes Intravenous 3 times per day 08/08/14 1341 08/08/14 1348   08/08/14 1400  vancomycin (VANCOCIN) IVPB 750 mg/150 ml premix  Status:  Discontinued     750 mg 150 mL/hr over 60 Minutes Intravenous Every 8 hours 08/08/14 1349 08/10/14 0606   08/01/14 1400  piperacillin-tazobactam (ZOSYN) IVPB 3.375 g  Status:  Discontinued     3.375 g 12.5 mL/hr over 240 Minutes Intravenous Every 8 hours 08/01/14 0811 08/08/14 1341   08/01/14 0900  piperacillin-tazobactam (ZOSYN) IVPB 3.375 g     3.375 g 100 mL/hr over 30 Minutes Intravenous  Once 08/01/14 0811 08/01/14 0914  07/31/14 0200  cefoTEtan (CEFOTAN) 2 g in dextrose 5 % 50 mL IVPB     2 g 100 mL/hr over 30 Minutes Intravenous Every 12 hours 07/30/14 2109 07/31/14 0326   07/29/14 2200  piperacillin-tazobactam (ZOSYN) IVPB 3.375 g  Status:  Discontinued     3.375 g 12.5 mL/hr over 240 Minutes Intravenous 3 times per day 07/29/14 1328 07/30/14 2109   07/29/14 1500  piperacillin-tazobactam (ZOSYN) IVPB 3.375 g     3.375 g 100 mL/hr over 30 Minutes Intravenous  Once 07/29/14 1328 07/29/14 1549      Assessment: Vancomycin trough lower than expected.  Prior doses charted.    Goal of Therapy:  Vancomycin trough level 15-20 mcg/ml  Plan:  Measure antibiotic drug levels at steady state Follow up culture results Vancomycin 1500mg  iv x1, then 1gm iv  q8hr  Tyler Deis, Shea Stakes Crowford 08/10/2014,6:14 AM

## 2014-08-10 NOTE — Progress Notes (Signed)
4 Days Post-Op  Subjective: He looks better, wound is healing nicely.  He is starting to eat better.  He had one loose stool yesterday.  He has O2 on and some fluids still going.  Objective: Vital signs in last 24 hours: Temp:  [97.6 F (36.4 C)-100.9 F (38.3 C)] 97.6 F (36.4 C) (03/08 0453) Pulse Rate:  [63-83] 63 (03/08 0453) Resp:  [19-20] 20 (03/08 0453) BP: (107-115)/(47-74) 115/69 mmHg (03/08 0453) SpO2:  [94 %-98 %] 98 % (03/08 0453) Last BM Date: 08/09/14 60 PO recorded   Regular diet 1 BM recorded 20 ml from drain TM 100.9 last PM, otherwise afebrile Labs OK, H/H is stable Intake/Output from previous day: 03/07 0701 - 03/08 0700 In: 60 [P.O.:60] Out: 320 [Urine:300; Drains:20] Intake/Output this shift:    General appearance: alert, cooperative and no distress Resp: still has some rales in the bases, no wheezing. GI: soft, sore, + BS, wound looks fine, still a green tint to drain.    Lab Results:   Recent Labs  08/09/14 0440 08/10/14 0435  WBC 10.4 8.1  HGB 7.5* 7.7*  HCT 24.6* 25.7*  PLT 456* 451*    BMET  Recent Labs  08/09/14 0440 08/10/14 0435  NA 135 138  K 3.3* 3.4*  CL 105 108  CO2 24 26  GLUCOSE 101* 88  BUN 11 14  CREATININE 0.57 0.49*  CALCIUM 7.2* 7.4*   PT/INR No results for input(s): LABPROT, INR in the last 72 hours.   Recent Labs Lab 08/10/14 0435  AST 19  ALT 23  ALKPHOS 139*  BILITOT 0.7  PROT 5.4*  ALBUMIN 1.7*     Lipase     Component Value Date/Time   LIPASE 20 07/28/2014 0830     Studies/Results: Dg Chest 2 View  08/08/2014   CLINICAL DATA:  59 year old male with shortness of breath, tachypnea and weakness for several months. Right colectomy on 07/30/2014 for colon cancer.  EXAM: CHEST  2 VIEW  COMPARISON:  08/01/2014 and prior radiographs dating back to 07/25/2014  FINDINGS: Cardiomediastinal silhouette is unchanged.  Decreased bilateral interstitial and airspace opacities noted.  Bibasilar opacities again  noted, improved on the right.  This is a low volume film.  There is no evidence of pneumothorax or acute bony abnormality.  Small bilateral pleural effusions are again identified.  IMPRESSION: Decreased bilateral interstitial and airspace opacities which may represent improved edema or infection.  Bibasilar opacities again noted, improved on the right.  Small bilateral pleural effusions again identified.   Electronically Signed   By: Margarette Canada M.D.   On: 08/08/2014 17:11    Medications: . atenolol  100 mg Oral Daily  . heparin subcutaneous  5,000 Units Subcutaneous 3 times per day  . imipenem-cilastatin  500 mg Intravenous Q6H  . multivitamin  5 mL Oral QHS  . pantoprazole  40 mg Oral QHS  . vancomycin  1,500 mg Intravenous Once  . vancomycin  1,000 mg Intravenous Q8H   1. Gallbladder - CHRONIC CHOLECYSTITIS AND CHOLELITHIASIS. 2. 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 3. Colon, segmental resection for tumor, right and distal ileum - INVASIVE POORLY DIFFERENTIATED ADENOCARCINOMA, WITH SIGNET RING CELL FEATURES AND TUMOR NECROSIS, INVADING THOUGH THE MUSCULARIS PROPRIA INTO PERICOLONIC FATTY TISSUE, EXTENDING TO THE SEROSA WITH EXTENSIVE ANGIOLYMPHATIC INVASION AND PERINEURAL INVASION PRESENT. -  MULTIPLE EXTRAMURAL SATELLITE TUMOR NODULES. - APPENDIX: APPENDICEAL TISSUE, NO EVIDENCE OF ACTIVE INFLAMMATION OR NEOPLASM. - ELEVEN OF SEVENTEEN LYMPH NODES, POSITIVE FOR METASTATIC CARCINOMA (11/17).   Assessment/Plan 1. Right colon tumor, peritoneal implants and right upper quadrant, right lateral abdominal wall, and on gallbladder, cholelithiasis 1A. Laparoscopy converted to exploratory laparotomy, extended right colectomy, biopsy of peritoneal nodules on diaphragm with closure  of diaphragm, cholecystectomy, POD 8 Dr. Zella Richer. 1B. Bile leak, no obvious leak seen on ERCP 08/06/14, with stent placement by Dr. Deatra Ina. -start clear liquids, hopefully can advance tomorrow 2. Anemia/Leukocytosis 3. Hypertension 4. Tobacco use 39 years 5. Hx of ETOH use quit 4 years ago 6. Dyslipidemia  7. DVT prophylaxis - SCD/heparin 8. He has had 9 days of Zosyn since admit and is now on Vancomycin and cefotetan, this will be day 2.  9. Chronic benzodiazapine use - less anxious today   Plan:  Over all he is doing better.  I would wean off O2, and IV fluids, get him walking more.  I think he may go home with the drain.  If the temp and fever resolves we are close to allowing him to go home.    LOS: 13 days    Donald Fry 08/10/2014

## 2014-08-10 NOTE — Progress Notes (Signed)
PROGRESS NOTE  Donald Fry BWI:[id number] DOB: 06-04-1956 DOA: 07/28/2014 PCP: [name], MD    Brief History 59 y.o. male with a PMH of hyperlipidemia, anemia, hypertension and tobacco abuse who was admitted 07/28/14 with right upper quadrant abdominal pain for couple of days prior to this admission. Patient also reported having ongoing dyspnea on exertion for several months prior to the admission and unintentional weight loss. Evaluation on the admission included CT abdomen which showed a possible colon cancer near the hepatic flexure with surrounding lymphadenopathy. Patient underwent colonoscopy 07/29/2014 followed by surgical resection of the colon cancer 07/30/2014. Postoperative course has been complicated with bile leak and healthcare associated pneumonia. Additionally, JP on 08/05/14 with bile color and has increased in amount drainge so plan is for ERCP 08/06/14.   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  - Colon mass confirmed by colonoscopy on 07/29/2014. Bleeding thought to be secondary to this large colon mass. - During this hospitalization, patient has received 4 units of PRBC blood transfusion. He reports no bleeding since. - Hemoglobin 7.5 on 08/09/2014.  -Hgb has largely remained stable; Baseline Hgb 7-8  New Fever -102.72F@1320  on 08/08/14 -check CXR--no new infiltrates -blood cultures x 2 sets--neg -UA--no pyuria -08/08/14--Cdiff-- NEG -JP drain--20cc in past 24 hrs -check lactic acid--1.3 -d/c zosyn--started imipenem 08/08/14 -start vancomycin 08/08/14 -pt continued to have fever 100.72F--in the setting of slight increase in abdominal pain and increased JP drainage, repeat CT abdomen and pelvis  Active problems: Colonic mass / colon cancer (adenocarcinoma) -Right colon tumor, peritoneal implants and right  upper quadrant, right lateral abdominal wall, and on gallbladder, cholelithiasis - Status post colonoscopy on 07/29/2014  -followed by exploratory laparotomy, extended right colectomy, biopsy of peritoneal nodules and cholecystectomy on 07/30/14. 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 -Patient will follow-up with oncology per scheduled appointment on discharge. -08/10/14--appreciate Dr. Benay Spice consult-->PET after d/c, place port-a-cath, start iron  Bile leak -08/05/14--pt had increase bile color drainage from JP again - HIDA showed bile leak.  - 08/06/14 ERCP (Dr. Boykin Peek leak from biliary system-->stent placed -08/10/14--JP drain--180cc in past shift  Leukocytosis / Acute respiratory failure with hypoxia / HCAP / bronchospasm - Patient on empiric Zosyn on admission given rising WBC and possibility of microperforation from colonic mass. - Antibiotics were discontinued post surgery but because of significant jump in WBC 08/01/14 Zosyn was resumed. Chest x-ray 2/28 showed possible pneumonia. - had completed 7 days of zosyn on 08/07/14 -clinically improving/stable on 2L [place]-->98% saturation  Essential hypertension  - BP at goal, continue atenolol.  Hyperlipidemia  - Statin on hold until PO intake improves.  Anxiety  - Continue Xanax.  Tobacco abuse/COPD - Continue nicotine patch. -at least 30 pack year hx  Hypokalemia  - Secondary to GI losses. Supplemented. - Check BMP in am.  Hx of ETOH use quit 4 years ago DVT Prophylaxis  - Heparin subcutaneous ordered.   Procedures/Studies: Dg Chest 2 View  08/08/2014   CLINICAL DATA:  59 year old male with shortness of breath, tachypnea and weakness for several months. Right colectomy on 07/30/2014 for colon cancer.  EXAM: CHEST  2 VIEW  COMPARISON:  08/01/2014 and prior radiographs dating back to 07/25/2014  FINDINGS: Cardiomediastinal silhouette is unchanged.  Decreased bilateral  interstitial and airspace opacities noted.  Bibasilar opacities again noted, improved on the right.  This is a low volume film.  There is no evidence of pneumothorax or acute bony abnormality.  Small bilateral pleural effusions are again identified.  IMPRESSION: Decreased bilateral interstitial and airspace opacities which may represent improved edema or infection.  Bibasilar opacities again noted, improved on the right.  Small bilateral pleural effusions again identified.   Electronically Signed   By: Margarette Canada M.D.   On: 08/08/2014 17:11   Dg Chest 2 View  07/25/2014   CLINICAL DATA:  Acute shortness of breath with right chest and rib pain for 1 week. lifting injury.  EXAM: CHEST  2 VIEW  COMPARISON:  None.  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 Normal heart size and vascularity. Minor central bronchitic change without focal pneumonia, collapse or consolidation. No edema, effusion or pneumothorax. Trachea midline. No acute osseous finding.  IMPRESSION: Mild central bronchitic change.  Anterior eventration of the right hemidiaphragm  No superimposed acute process.   Electronically Signed   By: Jerilynn Mages.  Shick M.D.   On: 07/25/2014 09:03   Nm Hepatobiliary Including Gb  08/02/2014   CLINICAL DATA:  Status post cholecystectomy 07/30/2014  EXAM: NUCLEAR MEDICINE HEPATOBILIARY IMAGING  TECHNIQUE: Sequential images of the abdomen were obtained out to 60 minutes following intravenous administration of radiopharmaceutical.  RADIOPHARMACEUTICALS:  5.5 [place] YQ-03K Choletec  COMPARISON:  None.  FINDINGS: Flow study shows uniform distribution of radioactivity in the liver. Sequential imaging of the liver and abdomen over a period of 60 minutes shows uniform uptake of the tracer in the liver.  Prior cholecystectomy. Filling of the common bile duct. Radiotracer uptake is present in the small bowel at 20 minutes. There is abnormal radiotracer collection adjacent to the inferior margin of the  left hepatic lobe most concerning for a bile leak.  At the end of 1-hour, there is relatively poor clearance of activity from the liver.  IMPRESSION: 1. Abnormal radiotracer collection adjacent to the inferior margin of the left hepatic lobe most concerning for a bile leak. These results were called by telephone at the time of interpretation on 08/02/2014 at 12:01 pm to Dr. Jacquelynn Cree , who verbally acknowledged these results.   Electronically Signed   By: Kathreen Devoid   On: 08/02/2014 12:18   US Abdomen Complete  07/25/2014   CLINICAL DATA:  Acute right upper quadrant pain for 1 week.  EXAM: ULTRASOUND ABDOMEN COMPLETE  COMPARISON:  None.  FINDINGS: Gallbladder: Intraluminal echogenic mobile abnormalities within the gallbladder without shadowing measure 1 cm in greatest diameter. These likely represent non shadowing stones. Normal wall thickness measuring 2.1 mm. No significant Murphy's sign. Small amount of free fluid along the right liver and gallbladder in the right upper quadrant remains nonspecific.  Common bile duct: Diameter: 2.8 mm  Liver: There are scattered small hypo echoic probable cysts throughout the liver. Left hepatic lobe anterior lateral hypoechoic minimally complex cyst measures 1.4 x 1.1 x 1.1 cm. Patent portal vein with normal hepatopetal flow. No biliary dilatation.  Right hepatic lobe hypoechoic cyst along the lateral inferior margin measures 12 x 13 x 14 mm.  IVC: Not visualized, obscured by bowel gas  Pancreas: Not visualized, obscured by bowel gas  Spleen: Limited visualization. 5.7 cm length. No definite abnormality.  Right Kidney: Length: 10.9 cm. Echogenicity within normal limits. No mass or hydronephrosis visualized.  Left Kidney: Length: 11.9 cm. Echogenicity within normal limits. No mass or hydronephrosis visualized.  Abdominal aorta: No  aneurysm visualized.  Other findings: None.  IMPRESSION: Probable non shadowing gallstones. No definite wall thickening or significant  Murphy's sign.  Small amount of free fluid along the right liver margin and gallbladder remains nonspecific.  Obscured IVC, pancreas, and spleen by bowel gas.  Scattered hepatic cysts   Electronically Signed   By: Jerilynn Mages.  Shick M.D.   On: 07/25/2014 13:28   Ct Abdomen Pelvis W Contrast  07/28/2014   ADDENDUM REPORT: 07/28/2014 11:16  ADDENDUM: Study discussed by telephone with Dr. Quintella Reichert on 07/28/2014 at 1105 hours.   Electronically Signed   By: Genevie Ann M.D.   On: 07/28/2014 11:16   07/28/2014   CLINICAL DATA:  59 year old male with 2 weeks of right side abdominal pain. Recently diagnosed with gallstones. Initial encounter.  EXAM: CT ABDOMEN AND PELVIS WITH CONTRAST  TECHNIQUE: Multidetector CT imaging of the abdomen and pelvis was performed using the standard protocol following bolus administration of intravenous contrast.  CONTRAST:  65mL OMNIPAQUE IOHEXOL 300 MG/ML SOLN, 98mL OMNIPAQUE IOHEXOL 300 MG/ML SOLN, 134mL OMNIPAQUE IOHEXOL 300 MG/ML SOLN  COMPARISON:  Abdomen ultrasound 07/25/2014.  FINDINGS: Minor atelectasis at the right lung base. No pericardial or pleural effusion.  No acute osseous abnormality identified.  No pelvic free fluid. Retained stool in the rectum. Negative bladder. Moderately redundant sigmoid colon with retained stool. Negative left colon. Moderately redundant splenic flexure.  Abnormal hepatic flexure with elongated circumferential wall thickening and luminal narrowing, but also extensive surrounding hepatic flexure mesentery nodularity and irregular intermediate density. Discrete rounded adjacent mesenteric nodes measuring up to 9 mm diameter are present.  The abnormal pericolic fat continues to the gallbladder fossa. However, the gallbladder itself does not appear distended or definitely inflamed. There is cholelithiasis evident at the gallbladder fundus.  The upstream ascending colon and cecum than have a more normal appearance. There is a gas-filled laterally situated  appendix which tracks cephalad. The terminal ileum is within normal limits. Oral contrast has not yet reached the distal small bowel. No dilated small bowel loops. Negative stomach. The second portion of the duodenum also is in close proximity to the abnormal hepatic flexure.  Trace perihepatic free fluid. Multiple nonspecific small low-density lesions in liver such as the 14 mm right hepatic lobe entity on series 2, image 27. Also 8 mm lesion in the left lobe on image 27. Larger 14 mm lesion in the lateral left lobe on image 33.  Spleen, pancreas, right adrenal gland, portal venous system, and kidneys are within normal limits.  There is intermediate density nodularity adjacent to the medial lobe of the left adrenal gland which is nonspecific. To a degree this resembles an enlarged retroperitoneal lymph node. There is a similar prominent node situated between the cava and right corona of the diaphragm measuring 6 mm.  No pneumoperitoneum. Aortoiliac calcified atherosclerosis noted. Major arterial structures in the abdomen and pelvis are patent.  IMPRESSION: 1. Markedly abnormal hepatic flexure with wall thickening along a 10-12 cm length but extensive surrounding mesenteric nodularity, and increased regional lymph nodes. The appearance is atypical for acute colitis and highly suspicious for adenocarcinoma. GI consultation for followup colonoscopy recommended. 2. Small volume perihepatic fluid. Multiple up to 14 mm indeterminate low-density lesions in the liver. Several prominent but indeterminate retroperitoneal lymph nodes in the upper abdomen. 3. Abnormal fat stranding/nodularity continues to the gallbladder fossa, but the gallbladder does not appear inflamed.  Electronically Signed: By: Genevie Ann M.D. On: 07/28/2014 11:00   Dg Chest Port 1  View  08/01/2014   CLINICAL DATA:  Postoperative bile leak.  Hypertension.  EXAM: PORTABLE CHEST - 1 VIEW  COMPARISON:  July 30, 2014.  FINDINGS: Stable cardiomediastinal  silhouette. Nasogastric tube is seen entering stomach. Right internal jugular catheter is stable in position with tip overlying expected position of the SVC. No pneumothorax is noted. Increased left perihilar opacity is noted concerning for pneumonia. Significantly increased opacity is noted in the right middle lobe concerning for pneumonia. Right upper lobe opacity is also noted concerning for pneumonia. No significant pleural effusion is noted. Bony thorax is intact.  IMPRESSION: Increased bilateral upper lobe and right middle lobe opacities are noted most consistent with pneumonia or possibly edema. Followup radiographs are recommended.   Electronically Signed   By: Marijo Conception, M.D.   On: 08/01/2014 10:35   Dg Chest Port 1 View  07/30/2014   CLINICAL DATA:  Evaluate PICC line placement  EXAM: PORTABLE CHEST - 1 VIEW  COMPARISON:  07/25/2014  FINDINGS: Low lung volumes. Mild right basilar atelectasis. No focal consolidation. No pleural effusion or pneumothorax.  The heart is normal in size.  Right IJ venous catheter terminates in the mid SVC.  Enteric tube courses into the proximal stomach.  Cholecystectomy clips.  IMPRESSION: Right IJ venous catheter terminates in the mid SVC. No pneumothorax.   Electronically Signed   By: Julian Hy M.D.   On: 07/30/2014 18:34   Dg Ercp  08/06/2014   CLINICAL DATA:  Bile leak  EXAM: ERCP  TECHNIQUE: Multiple spot images obtained with the fluoroscopic device and submitted for interpretation post-procedure.  COMPARISON:  None.  FINDINGS: Images demonstrate cannulation of the common bile duct and contrast filling the biliary tree. A biliary stent is present across the distal common bile duct.  IMPRESSION: Biliary stent placement.  These images were submitted for radiologic interpretation only. Please see the procedural report for the amount of contrast and the fluoroscopy time utilized.   Electronically Signed   By: Marybelle Killings M.D.   On: 08/06/2014 12:46          Subjective: Patient complaints of increasing abdominal pain today. No vomiting. He was able to tolerate a diet today. Denies fevers, chills, chest pain, hemoptysis. He hasn't shortness of breath with walking. No dysuria.  Objective: Filed Vitals:   08/10/14 1030 08/10/14 1054 08/10/14 1345 08/10/14 1620  BP:   112/60   Pulse:   70   Temp:   98.7 F (37.1 C) 99.2 F (37.3 C)  TempSrc:   Oral Oral  Resp:   20   Height:      Weight:      SpO2: 82% 93% 99%     Intake/Output Summary (Last 24 hours) at 08/10/14 1909 Last data filed at 08/10/14 1741  Gross per 24 hour  Intake    520 ml  Output    680 ml  Net   -160 ml   Weight change:  Exam:   General:  Pt is alert, follows commands appropriately, not in acute distress  HEENT: No icterus, No thrush,  Electra/AT  Cardiovascular: RRR, S1/S2, no rubs, no gallops  Respiratory: Bibasilar rales. No wheeze. Good air movement.  Abdomen: Soft/+BS, non tender, non distended, no guarding  Extremities: No edema, No lymphangitis, No petechiae, No rashes, no synovitis  Data Reviewed: Basic Metabolic Panel:  Recent Labs Lab 08/04/14 0510 08/08/14 0515 08/09/14 0440 08/10/14 0435  NA 144 141 135 138  K 3.9 3.8 3.3* 3.4*  CL 112 110 105 108  CO2 26 22 24 26   GLUCOSE 121* 105* 101* 88  BUN 12 10 11 14   CREATININE 0.44* 0.44* 0.57 0.49*  CALCIUM 7.9* 7.8* 7.2* 7.4*  MG  --   --   --  2.1   Liver Function Tests:  Recent Labs Lab 08/10/14 0435  AST 19  ALT 23  ALKPHOS 139*  BILITOT 0.7  PROT 5.4*  ALBUMIN 1.7*    Recent Labs Lab 08/04/14 0510  AMYLASE 26   No results for input(s): AMMONIA in the last 168 hours. CBC:  Recent Labs Lab 08/04/14 0510 08/07/14 0408 08/08/14 0515 08/09/14 0440 08/10/14 0435  WBC 9.5 8.6 11.5* 10.4 8.1  NEUTROABS  --  6.7  --   --   --   HGB 7.7* 7.5* 7.5* 7.5* 7.7*  HCT 24.7* 25.2* 24.7* 24.6* 25.7*  MCV 80.5 81.0 80.7 79.1 79.8  PLT 340 447* 567* 456* 451*    Cardiac Enzymes: No results for input(s): CKTOTAL, CKMB, CKMBINDEX, TROPONINI in the last 168 hours. BNP: Invalid input(s): POCBNP CBG: No results for input(s): GLUCAP in the last 168 hours.  Recent Results (from the past 240 hour(s))  MRSA PCR Screening     Status: None   Collection Time: 08/03/14 11:19 PM  Result Value Ref Range Status   MRSA by PCR NEGATIVE NEGATIVE Final    Comment:        The GeneXpert MRSA Assay (FDA approved for NASAL specimens only), is one component of a comprehensive MRSA colonization surveillance program. It is not intended to diagnose MRSA infection nor to guide or monitor treatment for MRSA infections.   Clostridium Difficile by PCR     Status: None   Collection Time: 08/08/14  6:06 AM  Result Value Ref Range Status   C difficile by pcr NEGATIVE NEGATIVE Final  Culture, blood (routine x 2)     Status: None (Preliminary result)   Collection Time: 08/08/14  1:47 PM  Result Value Ref Range Status   Specimen Description BLOOD LEFT ASSIST CONTROL  Final   Special Requests BOTTLES DRAWN AEROBIC ONLY 2CC  Final   Culture   Final           BLOOD CULTURE RECEIVED NO GROWTH TO DATE CULTURE WILL BE HELD FOR 5 DAYS BEFORE ISSUING A FINAL NEGATIVE REPORT Performed at [facility]    Report Status PENDING  Incomplete  Culture, blood (routine x 2)     Status: None (Preliminary result)   Collection Time: 08/08/14  2:30 PM  Result Value Ref Range Status   Specimen Description BLOOD LEFT ARM  Final   Special Requests BOTTLES DRAWN AEROBIC ONLY 2CC  Final   Culture   Final           BLOOD CULTURE RECEIVED NO GROWTH TO DATE CULTURE WILL BE HELD FOR 5 DAYS BEFORE ISSUING A FINAL NEGATIVE REPORT Performed at [facility]    Report Status PENDING  Incomplete  Urine culture     Status: None   Collection Time: 08/08/14  5:31 PM  Result Value Ref Range Status   Specimen Description URINE, CLEAN CATCH  Final   Special Requests NONE  Final    Colony Count NO GROWTH Performed at [facility]   Final   Culture NO GROWTH Performed at [facility]   Final   Report Status 08/09/2014 FINAL  Final     Scheduled Meds: . atenolol  100 mg  Oral Daily  . heparin subcutaneous  5,000 Units Subcutaneous 3 times per day  . imipenem-cilastatin  500 mg Intravenous Q6H  . multivitamin  5 mL Oral QHS  . pantoprazole  40 mg Oral QHS  . vancomycin  1,000 mg Intravenous Q8H   Continuous Infusions:    Marlee Trentman, DO  [facility] Pager [phone]  If 7PM-7AM, please contact night-coverage [email or web address] Password [email or web address] 08/10/2014, 7:09 PM   LOS: 13 days

## 2014-08-10 NOTE — Progress Notes (Signed)
On O2 @ 3L [place] at rest 93% OFF O2  At rest  82%.

## 2014-08-10 NOTE — Consult Note (Signed)
[place]  [phone]) [phone]   HEMATOLOGY ONCOLOGY CONSULTATION   Donald MCBRIEN  DOB: 1955/07/13  MR#: [id number]  CSN#: [id number]    Requesting Physician: [facility]    Patient Care Team: [name], MD as PCP - General (Family Medicine)  History of present illness: Colon Cancer        59 y.o.  male recently diagnosed with metastatic adenocarcinoma of the colon, admitted on 3/4 for partial colectomy. In review, he was admitted on 07/28/14 with 1 week history of right sided abdominal pain, fatigue, symptomatic anemia, and GI bleed. He had no nausea or vomiting. His appetite was normal. He had some unintentional weight loss, total 4 lbs in the last 6 months. He denied any cardiac or respiratory complaints. He had some lightheadedness in the setting of symptomatic anemia without any mental status changes.  Of note, he had been seen at the ED one week prior at which time an abdominal ultrasound was significant for cholelithiasis without cholecystitis and labs showed a Hb 8.2, which he was to follow by PCP.  During this admission, a CT abdomen and pelvis was performed on 2/24 which revealed Markedly abnormal hepatic flexure with wall thickening along a 10-12 cm length but extensive surrounding mesenteric nodularity, and increased regional lymph nodes. The appearance was highly suspicious for adenocarcinoma. Also seen, small volume perihepatic fluid, multiple up to 14 mm indeterminate low-density lesions in the liver, several prominent but indeterminate retroperitoneal lymph nodes in the upper abdomen and  abnormal fat stranding/nodularity to the gallbladder fossa. No CT of the chest was performed to date.  Colonoscopy on 2/25 showed a friable mass at the hepatic flexure, and sigmoid/descending colon. On 2/26 he underwent extended right colectomy, biopsy of peritoneal nodules on diaphragm with closure of diaphragm,cholecystectomy, POD 8 Dr. Zella Richer. "large bulky  tumor adherent to the second portion of duodenum and anterior pancreas as well as the middle colic vein. There were peritoneal implants on the gallbladder in the right upper quadrant and right mid abdominal wall areas. There were multiple omental implants as well".No obvious liver lesions at surgery." Path report, case ([id number]) showed Invasive poorly differentiated adenocarcinoma with signet ring cell features and tumor necrosis, invading through the muscularis propria into the pericolonic fatty tissue, extending into the serosa with extensive angiolymphatic invasion and perineural invasion. Multiple extramural satellite tumor nodules. 11/17 lymph nodes positive. Diaphragmatic implant was positive for metastatic adenocarcinoma as well with features infiltrating the skeletal muscle tissue. He is recuperating well, except for bile leak, no obvious leak seen on ERCP 08/06/14, with stent placement by Dr. Deatra Ina. Based on this diagnosis, we were kindly requested to see the patient with recommendations   Past medical history:      Past Medical History  Diagnosis Date  . Hypertension   . High cholesterol   . Anxiety   . Colon cancer-stage IV  07/30/2014    Past surgical history:      Past Surgical History  Procedure Laterality Date  . Colonoscopy N/A 07/29/2014    Procedure: COLONOSCOPY;  Surgeon: Ladene Artist, MD;  Location: [facility];  Service: Endoscopy;  Laterality: N/A;  . Laparoscopic partial colectomy N/A 07/30/2014    Procedure: LAPAROSCOPY CONVERTED TO EXPLORATORY LAPAROTOMY EXTENDED RIGHT COLECTOMY BIOPSY OF DIAPHRAGMATIC IMPLANT CLOSURE OF DIAPHRAGM CHOLECYSTECTOMY;  Surgeon: Jackolyn Confer, MD;  Location: [facility];  Service: General;  Laterality: N/A;  . Cholecystectomy N/A 07/30/2014    Procedure: LAPAROSCOPIC CHOLECYSTECTOMY WITH INTRAOPERATIVE CHOLANGIOGRAM;  Surgeon:  Jackolyn Confer, MD;  Location: [facility];  Service: General;  Laterality: N/A;  . Ercp N/A 08/06/2014    Procedure:  ENDOSCOPIC RETROGRADE CHOLANGIOPANCREATOGRAPHY (ERCP);  Surgeon: Inda Castle, MD;  Location: [facility];  Service: Endoscopy;  Laterality: N/A;    Medications:  Prior to Admission:  Prescriptions prior to admission  Medication Sig Dispense Refill Last Dose  . ALPRAZolam (XANAX) 1 MG tablet Take 1 mg by mouth 3 (three) times daily.   07/28/2014 at Unknown time  . aspirin 81 MG tablet Take 81 mg by mouth daily.   07/28/2014 at Unknown time  . atenolol (TENORMIN) 100 MG tablet Take 100 mg by mouth daily.   07/28/2014 at 0500  . ferrous sulfate 325 (65 FE) MG tablet Take 325 mg by mouth daily with breakfast.   07/27/2014 at Unknown time  . lovastatin (MEVACOR) 40 MG tablet Take 40 mg by mouth at bedtime.   07/27/2014 at Unknown time  . multivitamin-iron-minerals-folic acid (CENTRUM) chewable tablet Chew 1 tablet by mouth daily. 30 tablet 0 07/28/2014 at Unknown time  . traMADol (ULTRAM) 50 MG tablet Take 1 tablet (50 mg total) by mouth every 6 (six) hours as needed for severe pain. 15 tablet 0 07/28/2014 at Unknown time  . [EXPIRED] predniSONE (DELTASONE) 50 MG tablet Take 1 tablet (50 mg total) by mouth daily. 3 tablet 0     ZOX:[id number], albuterol, ALPRAZolam, hydrALAZINE, morphine injection, ondansetron **OR** ondansetron (ZOFRAN) IV, oxyCODONE-acetaminophen, simethicone  Allergies: No Known Allergies  Family history:   Remarkable for Father and brother with lung cancer. Several family members with breast and pancreas cancer on the maternal side.                                        Social history: He lives in [place]. He is a delivery truck driver. Married. He smoked 1/2 ppd for 40 + years , decided to quit after cancer diagnosis. Quit ETOH 4 years ago. No transfusion history prior to hospital admission.   ROS: Constitutional: Denies fevers, chills or abnormal night sweats Eyes: Denies blurriness of vision, double vision or watery eyes Ears, nose, mouth, throat, and face:  Denies mucositis or sore throat Respiratory: Denies cough, he has exertional dyspnea, no wheezes Cardiovascular: Denies palpitation, chest discomfort or lower extremity swelling Gastrointestinal:  As per HPI. He has some incisional tenderness, but pain is overall improving since admission. Right abdominal pain prior to admission, several episodes of blood in the stool Skin: Denies abnormal skin rashes Lymphatics: Denies new lymphadenopathy or easy bruising Neurological: Denies numbness, tingling or new weaknesses Behavioral/Psych: Mood is stable, no new changes  All other systems were reviewed with the patient and are negative.    Physical Exam    ECOG PERFORMANCE STATUS: 2  Filed Vitals:   08/10/14 0453  BP: 115/69  Pulse: 63  Temp: 97.6 F (36.4 C)  Resp: 20   Filed Weights   08/02/14 0400 08/04/14 0555 08/06/14 0835  Weight: 169 lb 1.5 oz (76.7 kg) 165 lb 5.5 oz (75 kg) 163 lb (73.936 kg)    GENERAL:alert, no distress and comfortable SKIN: skin color, texture, turgor are normal, no rashes or significant lesions EYES: normal, conjunctiva are pink and non-injected, sclera clear OROPHARYNX:no exudate, no erythema and lips, buccal mucosa, and tongue normal  NECK: supple, thyroid normal size, non-tender, without nodularity LYMPH:  no palpable lymphadenopathy in the  cervical, supraclavicular, axillary or inguinal LUNGS: trace bibasilar rales, no wheezing or rhonchi.  HEART: regular rate & rhythm and no murmurs  ABDOMEN: abdomen soft, incisional tenderness, and normal bowel sounds, no mass Musculoskeletal:no cyanosis of digits and no clubbing  PSYCH: alert & oriented x 3 with fluent speech NEURO: no focal motor/sensory deficits GU: Testes without mass, nodular mobile lesion in the left epididymis Vascular: Trace extremity edema bilaterally   Lab results:       CBC  Recent Labs Lab 08/04/14 0510 08/07/14 0408 08/08/14 0515 08/09/14 0440 08/10/14 0435  WBC 9.5 8.6  11.5* 10.4 8.1  HGB 7.7* 7.5* 7.5* 7.5* 7.7*  HCT 24.7* 25.2* 24.7* 24.6* 25.7*  PLT 340 447* 567* 456* 451*  MCV 80.5 81.0 80.7 79.1 79.8  MCH 25.1* 24.1* 24.5* 24.1* 23.9*  MCHC 31.2 29.8* 30.4 30.5 30.0  RDW 18.2* 18.8* 19.7* 19.7* 19.6*  LYMPHSABS  --  1.0  --   --   --   MONOABS  --  0.6  --   --   --   EOSABS  --  0.3  --   --   --   BASOSABS  --  0.0  --   --   --     Anemia panel:  No results for input(s): VITAMINB12, FOLATE, FERRITIN, TIBC, IRON, RETICCTPCT in the last 72 hours.   Chemistries   Recent Labs Lab 08/04/14 0510 08/08/14 0515 08/09/14 0440 08/10/14 0435  NA 144 141 135 138  K 3.9 3.8 3.3* 3.4*  CL 112 110 105 108  CO2 _0 GLUCOSE 121* 105* 101* 88  BUN _1 CREATININE 0.44* 0.44* 0.57 0.49*  CALCIUM 7.9* 7.8* 7.2* 7.4*  MG  --   --   --  2.1       Dg Chest 2 View  08/08/2014   CLINICAL DATA:  59 year old male with shortness of breath, tachypnea and weakness for several months. Right colectomy on 07/30/2014 for colon cancer.  EXAM: CHEST  2 VIEW  COMPARISON:  08/01/2014 and prior radiographs dating back to 07/25/2014  FINDINGS: Cardiomediastinal silhouette is unchanged.  Decreased bilateral interstitial and airspace opacities noted.  Bibasilar opacities again noted, improved on the right.  This is a low volume film.  There is no evidence of pneumothorax or acute bony abnormality.  Small bilateral pleural effusions are again identified.  IMPRESSION: Decreased bilateral interstitial and airspace opacities which may represent improved edema or infection.  Bibasilar opacities again noted, improved on the right.  Small bilateral pleural effusions again identified.   Electronically Signed   By: Margarette Canada M.D.   On: 08/08/2014 17:11   Dg Chest 2 View  07/25/2014   CLINICAL DATA:  Acute shortness of breath with right chest and rib pain for 1 week. lifting injury.  EXAM: CHEST  2 VIEW  COMPARISON:  None.  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 Normal heart size and vascularity. Minor central bronchitic change without focal pneumonia, collapse or consolidation. No edema, effusion or pneumothorax. Trachea midline. No acute osseous finding.  IMPRESSION: Mild central bronchitic change.  Anterior eventration of the right hemidiaphragm  No superimposed acute process.   Electronically Signed   By: Jerilynn Mages.  Shick M.D.   On: 07/25/2014 09:03   Nm Hepatobiliary Including Gb  08/02/2014   CLINICAL DATA:  Status post cholecystectomy 07/30/2014  EXAM: NUCLEAR MEDICINE HEPATOBILIARY IMAGING  TECHNIQUE: Sequential images of the abdomen were obtained  out to 60 minutes following intravenous administration of radiopharmaceutical.  RADIOPHARMACEUTICALS:  5.5 [place] [id number] Choletec  COMPARISON:  None.  FINDINGS: Flow study shows uniform distribution of radioactivity in the liver. Sequential imaging of the liver and abdomen over a period of 60 minutes shows uniform uptake of the tracer in the liver.  Prior cholecystectomy. Filling of the common bile duct. Radiotracer uptake is present in the small bowel at 20 minutes. There is abnormal radiotracer collection adjacent to the inferior margin of the left hepatic lobe most concerning for a bile leak.  At the end of 1-hour, there is relatively poor clearance of activity from the liver.  IMPRESSION: 1. Abnormal radiotracer collection adjacent to the inferior margin of the left hepatic lobe most concerning for a bile leak. These results were called by telephone at the time of interpretation on 08/02/2014 at 12:01 pm to Dr. Jacquelynn Cree , who verbally acknowledged these results.   Electronically Signed   By: Kathreen Devoid   On: 08/02/2014 12:18   US Abdomen Complete  07/25/2014   CLINICAL DATA:  Acute right upper quadrant pain for 1 week.  EXAM: ULTRASOUND ABDOMEN COMPLETE  COMPARISON:  None.  FINDINGS: Gallbladder: Intraluminal echogenic mobile abnormalities within the gallbladder without shadowing  measure 1 cm in greatest diameter. These likely represent non shadowing stones. Normal wall thickness measuring 2.1 mm. No significant Murphy's sign. Small amount of free fluid along the right liver and gallbladder in the right upper quadrant remains nonspecific.  Common bile duct: Diameter: 2.8 mm  Liver: There are scattered small hypo echoic probable cysts throughout the liver. Left hepatic lobe anterior lateral hypoechoic minimally complex cyst measures 1.4 x 1.1 x 1.1 cm. Patent portal vein with normal hepatopetal flow. No biliary dilatation.  Right hepatic lobe hypoechoic cyst along the lateral inferior margin measures 12 x 13 x 14 mm.  IVC: Not visualized, obscured by bowel gas  Pancreas: Not visualized, obscured by bowel gas  Spleen: Limited visualization. 5.7 cm length. No definite abnormality.  Right Kidney: Length: 10.9 cm. Echogenicity within normal limits. No mass or hydronephrosis visualized.  Left Kidney: Length: 11.9 cm. Echogenicity within normal limits. No mass or hydronephrosis visualized.  Abdominal aorta: No aneurysm visualized.  Other findings: None.  IMPRESSION: Probable non shadowing gallstones. No definite wall thickening or significant Murphy's sign.  Small amount of free fluid along the right liver margin and gallbladder remains nonspecific.  Obscured IVC, pancreas, and spleen by bowel gas.  Scattered hepatic cysts   Electronically Signed   By: Jerilynn Mages.  Shick M.D.   On: 07/25/2014 13:28   Ct Abdomen Pelvis W Contrast  07/28/2014   ADDENDUM REPORT: 07/28/2014 11:16  ADDENDUM: Study discussed by telephone with Dr. Quintella Reichert on 07/28/2014 at 1105 hours.   Electronically Signed   By: Genevie Ann M.D.   On: 07/28/2014 11:16   07/28/2014   CLINICAL DATA:  59 year old male with 2 weeks of right side abdominal pain. Recently diagnosed with gallstones. Initial encounter.  EXAM: CT ABDOMEN AND PELVIS WITH CONTRAST  TECHNIQUE: Multidetector CT imaging of the abdomen and pelvis was performed using the  standard protocol following bolus administration of intravenous contrast.  CONTRAST:  80m OMNIPAQUE IOHEXOL 300 MG/ML SOLN, 262mOMNIPAQUE IOHEXOL 300 MG/ML SOLN, 10045mMNIPAQUE IOHEXOL 300 MG/ML SOLN  COMPARISON:  Abdomen ultrasound 07/25/2014.  FINDINGS: Minor atelectasis at the right lung base. No pericardial or pleural effusion.  No acute osseous abnormality identified.  No pelvic free fluid. Retained stool in  the rectum. Negative bladder. Moderately redundant sigmoid colon with retained stool. Negative left colon. Moderately redundant splenic flexure.  Abnormal hepatic flexure with elongated circumferential wall thickening and luminal narrowing, but also extensive surrounding hepatic flexure mesentery nodularity and irregular intermediate density. Discrete rounded adjacent mesenteric nodes measuring up to 9 mm diameter are present.  The abnormal pericolic fat continues to the gallbladder fossa. However, the gallbladder itself does not appear distended or definitely inflamed. There is cholelithiasis evident at the gallbladder fundus.  The upstream ascending colon and cecum than have a more normal appearance. There is a gas-filled laterally situated appendix which tracks cephalad. The terminal ileum is within normal limits. Oral contrast has not yet reached the distal small bowel. No dilated small bowel loops. Negative stomach. The second portion of the duodenum also is in close proximity to the abnormal hepatic flexure.  Trace perihepatic free fluid. Multiple nonspecific small low-density lesions in liver such as the 14 mm right hepatic lobe entity on series 2, image 27. Also 8 mm lesion in the left lobe on image 27. Larger 14 mm lesion in the lateral left lobe on image 33.  Spleen, pancreas, right adrenal gland, portal venous system, and kidneys are within normal limits.  There is intermediate density nodularity adjacent to the medial lobe of the left adrenal gland which is nonspecific. To a degree this  resembles an enlarged retroperitoneal lymph node. There is a similar prominent node situated between the cava and right corona of the diaphragm measuring 6 mm.  No pneumoperitoneum. Aortoiliac calcified atherosclerosis noted. Major arterial structures in the abdomen and pelvis are patent.  IMPRESSION: 1. Markedly abnormal hepatic flexure with wall thickening along a 10-12 cm length but extensive surrounding mesenteric nodularity, and increased regional lymph nodes. The appearance is atypical for acute colitis and highly suspicious for adenocarcinoma. GI consultation for followup colonoscopy recommended. 2. Small volume perihepatic fluid. Multiple up to 14 mm indeterminate low-density lesions in the liver. Several prominent but indeterminate retroperitoneal lymph nodes in the upper abdomen. 3. Abnormal fat stranding/nodularity continues to the gallbladder fossa, but the gallbladder does not appear inflamed.  Electronically Signed: By: Genevie Ann M.D. On: 07/28/2014 11:00   Dg Chest Port 1 View  08/01/2014   CLINICAL DATA:  Postoperative bile leak.  Hypertension.  EXAM: PORTABLE CHEST - 1 VIEW  COMPARISON:  July 30, 2014.  FINDINGS: Stable cardiomediastinal silhouette. Nasogastric tube is seen entering stomach. Right internal jugular catheter is stable in position with tip overlying expected position of the SVC. No pneumothorax is noted. Increased left perihilar opacity is noted concerning for pneumonia. Significantly increased opacity is noted in the right middle lobe concerning for pneumonia. Right upper lobe opacity is also noted concerning for pneumonia. No significant pleural effusion is noted. Bony thorax is intact.  IMPRESSION: Increased bilateral upper lobe and right middle lobe opacities are noted most consistent with pneumonia or possibly edema. Followup radiographs are recommended.   Electronically Signed   By: Marijo Conception, M.D.   On: 08/01/2014 10:35   Dg Chest Port 1 View  07/30/2014   CLINICAL  DATA:  Evaluate PICC line placement  EXAM: PORTABLE CHEST - 1 VIEW  COMPARISON:  07/25/2014  FINDINGS: Low lung volumes. Mild right basilar atelectasis. No focal consolidation. No pleural effusion or pneumothorax.  The heart is normal in size.  Right IJ venous catheter terminates in the mid SVC.  Enteric tube courses into the proximal stomach.  Cholecystectomy clips.  IMPRESSION: Right IJ venous  catheter terminates in the mid SVC. No pneumothorax.   Electronically Signed   By: Julian Hy M.D.   On: 07/30/2014 18:34   Dg Ercp  08/06/2014   CLINICAL DATA:  Bile leak  EXAM: ERCP  TECHNIQUE: Multiple spot images obtained with the fluoroscopic device and submitted for interpretation post-procedure.  COMPARISON:  None.  FINDINGS: Images demonstrate cannulation of the common bile duct and contrast filling the biliary tree. A biliary stent is present across the distal common bile duct.  IMPRESSION: Biliary stent placement.  These images were submitted for radiologic interpretation only. Please see the procedural report for the amount of contrast and the fluoroscopy time utilized.   Electronically Signed   By: Marybelle Killings M.D.   On: 08/06/2014 12:46   FINAL for TORBEN, SOLOWAY R [phone]) 1. Gallbladder - CHRONIC CHOLECYSTITIS AND CHOLELITHIASIS. 2. 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 3. Colon, segmental resection for tumor, right and distal ileum - INVASIVE POORLY DIFFERENTIATED ADENOCARCINOMA, WITH SIGNET RING CELL FEATURES AND TUMOR NECROSIS, INVADING THOUGH THE MUSCULARIS PROPRIA INTO PERICOLONIC FATTY TISSUE, EXTENDING TO THE SEROSA WITH EXTENSIVE ANGIOLYMPHATIC INVASION AND PERINEURAL INVASION PRESENT. - MULTIPLE EXTRAMURAL SATELLITE TUMOR NODULES. - APPENDIX: APPENDICEAL TISSUE, NO EVIDENCE OF ACTIVE INFLAMMATION OR NEOPLASM. - ELEVEN OF SEVENTEEN  LYMPH NODES, POSITIVE FOR METASTATIC CARCINOMA (11/17).   Assessment/Plan:58 y.o. male  with   Colon cancer  CT abdomen and pelvis was performed on 2/24 which revealed markedly abnormal hepatic flexure,mesenteric nodularity, and increased regional lymph nodes, liver lesions, and retroperitoneal lymph nodes in the upper abdomen and  abnormal fat stranding/nodularity to the gallbladder fossa.  Colonoscopy on 2/25 showed a friable mass at the hepatic flexure, and sigmoid/descending colon. On 2/26 he underwent extended right colectomy, biopsy of peritoneal nodules on diaphragm with closure of diaphragm, cholecystectomy.Path report, case ([id number]) showed Invasive poorly differentiated adenocarcinoma with signet ring cell features and tumor necrosis, invading through the muscularis propria into the pericolonic fatty tissue, extending into the serosa with extensive angiolymphatic invasion and perineural invasion. Multiple extramural satellite tumor nodules. 11/17 lymph nodes positive. Diaphragmatic implant was positive for metastatic adenocarcinoma as well with features infiltrating the skeletal muscle tissue. He has Stage 4 disease.  Will obtain CEA while in hospital, preferable from blood drawn today.  Consider CT chest to complete staging, rule out occult disease.  An appointment is to be arranged as outpatient as patient will require treatment after discharge in view of metastatic disease.   Anemia -iron deficiency  DVT prophylaxis On heparin and SCDs  History of tobacco abuse History of alcohol use in the remote past  Full Code   Other medical issues as per admitting team.  Rondel Jumbo, PA-C 08/10/2014 Mr. Cerone was interviewed and examined.  Impression: 1. Stage IV colon cancer, status post a right colectomy 07/30/2014. 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 Indeterminate liver lesions noted on a CT 07/28/2014  No loss of mismatch  repair protein expression  2. Microcytic anemia-likely iron deficiency anemia secondary to bleeding from the hepatic flexure mass  3. History of tobacco use  4. Remote history of alcohol use  5. Postoperative bile leak, status post an ERCP and bile duct stent placement 08/06/2014    Mr. Knipple has been diagnosed with metastatic colon cancer. I discussed the diagnosis and treatment options with Mr. Spinnato and his family. He understands  no therapy will be curative. I will recommend systemic chemotherapy when he has recovered from surgery.  Recommendations:  1. Check CEA 2. Request K-ras testing on the resected tumor 3. Outpatient staging PET scan 4. Outpatient follow-up at the [facility] in approximately 2 weeks 5. Port-A-Cath placement per Dr. Lucia Gaskins or Dr. Zella Richer 6. Begin oral iron therapy  I will continue following him in the hospital. We will arrange for an outpatient appointment in approximate 2 weeks

## 2014-08-11 ENCOUNTER — Encounter (HOSPITAL_COMMUNITY): Payer: Self-pay

## 2014-08-11 LAB — CBC
HCT: 24.9 % — ABNORMAL LOW (ref 39.0–52.0)
Hemoglobin: 7.5 g/dL — ABNORMAL LOW (ref 13.0–17.0)
MCH: 23.9 pg — ABNORMAL LOW (ref 26.0–34.0)
MCHC: 30.1 g/dL (ref 30.0–36.0)
MCV: 79.3 fL (ref 78.0–100.0)
Platelets: 392 10*3/uL (ref 150–400)
RBC: 3.14 MIL/uL — ABNORMAL LOW (ref 4.22–5.81)
RDW: 19.4 % — ABNORMAL HIGH (ref 11.5–15.5)
WBC: 9.1 10*3/uL (ref 4.0–10.5)

## 2014-08-11 LAB — BASIC METABOLIC PANEL
Anion gap: 9 (ref 5–15)
BUN: 11 mg/dL (ref 6–23)
CO2: 26 mmol/L (ref 19–32)
Calcium: 7.5 mg/dL — ABNORMAL LOW (ref 8.4–10.5)
Chloride: 102 mmol/L (ref 96–112)
Creatinine, Ser: 0.46 mg/dL — ABNORMAL LOW (ref 0.50–1.35)
GFR calc Af Amer: >90 mL/min (ref >90)
GFR calc non Af Amer: >90 mL/min (ref >90)
GLUCOSE: 93 mg/dL (ref 70–99)
POTASSIUM: 3 mmol/L — AB (ref 3.5–5.1)
Sodium: 137 mmol/L (ref 135–145)

## 2014-08-11 LAB — HEPATIC FUNCTION PANEL
ALK PHOS: 124 U/L — AB (ref 39–117)
ALT: 15 U/L (ref 0–53)
AST: 16 U/L (ref 0–37)
Albumin: 1.7 g/dL — ABNORMAL LOW (ref 3.5–5.2)
Bilirubin, Direct: <0.1 mg/dL (ref 0.0–0.5)
Total Bilirubin: 0.1 mg/dL — ABNORMAL LOW (ref 0.3–1.2)
Total Protein: 5.2 g/dL — ABNORMAL LOW (ref 6.0–8.3)

## 2014-08-11 LAB — CEA: CEA: 3.1 ng/mL (ref 0.0–4.7)

## 2014-08-11 LAB — MAGNESIUM: MAGNESIUM: 2.2 mg/dL (ref 1.5–2.5)

## 2014-08-11 LAB — LIPASE, BLOOD: Lipase: 41 U/L (ref 11–59)

## 2014-08-11 LAB — AMYLASE: Amylase: 45 U/L (ref 0–105)

## 2014-08-11 MED ORDER — ALPRAZOLAM 1 MG PO TABS
1.0000 mg | ORAL_TABLET | Freq: Three times a day (TID) | ORAL | Status: DC
  Administered 2014-08-11 – 2014-08-16 (×15): 1 mg via ORAL
  Filled 2014-08-11 (×15): qty 1

## 2014-08-11 MED ORDER — POTASSIUM CHLORIDE IN NACL 40-0.9 MEQ/L-% IV SOLN
INTRAVENOUS | Status: DC
  Administered 2014-08-11: 125 mL/h via INTRAVENOUS
  Administered 2014-08-14: 10 mL/h via INTRAVENOUS
  Filled 2014-08-11 (×7): qty 1000

## 2014-08-11 MED ORDER — ZOLPIDEM TARTRATE 5 MG PO TABS
5.0000 mg | ORAL_TABLET | Freq: Every evening | ORAL | Status: DC | PRN
Start: 2014-08-11 — End: 2014-08-16
  Administered 2014-08-11 – 2014-08-15 (×5): 5 mg via ORAL
  Filled 2014-08-11 (×5): qty 1

## 2014-08-11 NOTE — Progress Notes (Signed)
[facility] Progress Note  Subjective:  Mr. Gaumer is a 59 year old gentleman who was admitted 07/28/2014. On admission he had a CT of the abdomen which showed a possible colon cancer near the hepatic flexure. Patient had a colonoscopy in February 25 and subsequently had surgical resection of the colon cancer on February 26. Postoperative course complicated with a bile leak. He had an ERCP on 08/06/2014 by Dr. Deatra Ina and had placement of a biliary stent. He was last seen by GI on March 5 it which time his JP drainage had dramatically decreased and the plan was for a follow-up in the GI office in 4 weeks with repeat ERCP in 6-8 weeks. Patient's postop course has also been complicated with pneumonia. On March 7 patient was complaining of nausea without emesis and had a fever of 102.9. On March 8 he continued to have a fever of 100.9 along with a slight increase in abdominal pain and increased JP drainage. He had an abdominal CT earlier today that showed some inflammatory stranding at the celiac axis and rhythm mesentery felt to be due to acute pancreatitis. No pancreatic necrosis or organized fluid collection was seen common bile duct stent in place no evidence of bowel obstruction. Postoperative drain in place with small volume perihepatic and small to moderate volume pelvic free fluid which appears to be simple in nature. Moderate pleural effusions with new lower lobe consolidation and bilateral middle lobe patchy opacity suspicious for pneumonia. White blood cell count 9.1. Lipase 41. Amylase 45. At this time patient is denying abdominal pain. He says he is only nauseous when he smells food otherwise he is not nauseous. He states when he tries to eat everything "tastes like a hospital bed".    Objective:  Vital signs in last 24 hours: Temp:  [97.9 F (36.6 C)-99.9 F (37.7 C)] 97.9 F (36.6 C) (03/09 0540) Pulse Rate:  [62-71] 62 (03/09 0540) Resp:  [18-20] 18 (03/09 0540) BP:  (112-138)/(60-63) 118/63 mmHg (03/09 0540) SpO2:  [97 %-100 %] 100 % (03/09 0540) Last BM Date: 08/09/14 General:   Alert,  Well-developed,  in NAD Heart:  Regular rate and rhythm; no murmurs Pulm;lungs clear Abdomen:  Soft, mild tenderness likely from surgery, nondistended. +BS, drain with milky/yellowish drainage Extremities:  Without edema. Neurologic:  Alert and  oriented x4;  grossly normal neurologically. Psych:  Alert and cooperative. Normal mood and affect.  Intake/Output from previous day: 03/08 0701 - 03/09 0700 In: 1600 [P.O.:600; IV Piggyback:1000] Out: 1740 [Urine:1300; Drains:440] Intake/Output this shift: Total I/O In: -  Out: 325 [Urine:175; Drains:150]  Lab Results:  Recent Labs  08/09/14 0440 08/10/14 0435 08/11/14 0440  WBC 10.4 8.1 9.1  HGB 7.5* 7.7* 7.5*  HCT 24.6* 25.7* 24.9*  PLT 456* 451* 392   BMET  Recent Labs  08/09/14 0440 08/10/14 0435 08/11/14 0440  NA 135 138 137  K 3.3* 3.4* 3.0*  CL 105 108 102  CO2 24 26 26   GLUCOSE 101* 88 93  BUN 11 14 11   CREATININE 0.57 0.49* 0.46*  CALCIUM 7.2* 7.4* 7.5*   LFT  Recent Labs  08/11/14 0440  PROT 5.2*  ALBUMIN 1.7*  AST 16  ALT 15  ALKPHOS 124*  BILITOT 0.1*  BILIDIR <0.1  IBILI NOT CALCULATED      Ct Abdomen Pelvis W Contrast  08/11/2014   ADDENDUM REPORT: 08/11/2014 08:32  ADDENDUM: Study discussed by telephone with Dr. Shanon Brow TAT on 08/11/2014 at 0816 hours.  Electronically Signed   By: Genevie Ann M.D.   On: 08/11/2014 08:32   08/11/2014   CLINICAL DATA:  59 year old male with lower GI bleed, lower abdominal pain. Current history of colon cancer resection 07/30/2014. Postoperative bile leak and pneumonia. Initial encounter.  EXAM: CT ABDOMEN AND PELVIS WITH CONTRAST  TECHNIQUE: Multidetector CT imaging of the abdomen and pelvis was performed using the standard protocol following bolus administration of intravenous contrast.  CONTRAST:  161mL OMNIPAQUE IOHEXOL 300 MG/ML  SOLN   COMPARISON:  Preoperative CT Abdomen and Pelvis 07/28/2014  FINDINGS: New moderate right greater than left layering pleural effusions. New bilateral lower lobe consolidation and compressive atelectasis. Patchy opacity in the lingula and right middle lobe are new. No pericardial effusion.  No acute osseous abnormality identified.  Small to moderate volume of pelvic free fluid with simple fluid densitometry. Unremarkable bladder. Mild presacral stranding. Negative distal colon.  Mild stranding in both pericolic gutters. Negative splenic flexure. Residual transverse colon with retained stool. Small to large bowel anastomosis at the level of the mid transverse with surgical absence of the more proximal colon. Overlying midline ventral abdominal wall incision with skin staples. Right abdominal percutaneous postoperative drain which terminates near the anastomosis. The gallbladder also now is surgically absent. CBD stent in place terminating in the duodenum. Small volume perihepatic free fluid.  Small round hypodense areas in the left and right hepatic lobes are stable. There is a new linear area of hypodensity along the periphery of the right lobe which may be postoperative in nature. Portal venous system is patent. Major arterial structures are patent. Aortoiliac calcified atherosclerosis noted.  The spleen is within normal limits. There is confluent stranding in the lesser sac nearest the pancreas and celiac vasculature. Pancreatic enhancement is preserved. No focal abnormality of the stomach. Stranding continues into the root of the small bowel mesentery. Oral contrast has reached the distal small bowel.  Negative adrenal glands. Renal enhancement and contrast excretion within normal limits. No pneumoperitoneum identified.  IMPRESSION: 1. Inflammatory stranding at the celiac axis and root of the mesentery, favor due to acute pancreatitis. No pancreatic necrosis or organized fluid collection identified. CBD stent in  place. 2. Postoperative changes from right hemicolectomy and cholecystectomy for treatment of hepatic flexure poorly differentiated adenocarcinoma. No evidence of bowel obstruction. Postoperative drain in place with small volume perihepatic and small to moderate volume pelvic free fluid which appears simple in nature. 3. Moderate pleural effusions with new lower lobe consolidation and bilateral middle lobe patchy opacity suspicious for pneumonia.  Electronically Signed: By: Genevie Ann M.D. On: 08/11/2014 08:07    ASSESSMENT/PLAN:   59 year old male admitted with right colon lesion status post exploratory laparotomy, right colectomy, biopsy of peritoneal nodules on diaphragm and cholecystectomy. He was noted to have a bile leak and had an ERCP on March 4 with stent placement. No obvious leak was seen. He developed fevers 2 days ago and CT showed inflammatory stranding at the celiac axis and root of the mesentery possibly due to acute pancreatitis. No pancreatic necrosis or organized fluid collection was seen. The patient's pancreatic enzymes remain normal as does his white blood count, which go against pancreatitis. Question if increased drainage may be lymphatic.? If drainage related to malignancy ?Continue IV antibiotics.Peritonel amylase pending.Will review with Dr Hilarie Fredrickson re: further recommendations.    LOS: 14 days   Yumna Ebers, Vita Barley PA-C 08/11/2014, Pager [phone]

## 2014-08-11 NOTE — Progress Notes (Signed)
OT Cancellation Note  Patient Details Name: Donald Fry MRN: [id number] DOB: 1955-06-18   Cancelled Treatment:    Reason Eval/Treat Not Completed: Other (comment).  Pt doesn't feel up to OT this pm.  Will try to return tomorrow.  Emmy Keng 08/11/2014, 2:01 PM  Lesle Chris, OTR/L [phone] 08/11/2014

## 2014-08-11 NOTE — Progress Notes (Signed)
PROGRESS NOTE  Donald Fry ZTI:[id number] DOB: Jul 26, 1955 DOA: 07/28/2014 PCP: [name], MD    Brief History 59 y.o. male with a PMH of hyperlipidemia, anemia, hypertension and tobacco abuse who was admitted 07/28/14 with right upper quadrant abdominal pain for couple of days prior to this admission. Patient also reported having ongoing dyspnea on exertion for several months prior to the admission and unintentional weight loss. Evaluation on the admission included CT abdomen which showed a possible colon cancer near the hepatic flexure with surrounding lymphadenopathy. Patient underwent colonoscopy 07/29/2014 followed by surgical resection of the colon cancer 07/30/2014. Postoperative course has been complicated with bile leak and healthcare associated pneumonia. Additionally, JP on 08/05/14 with bile color and has increased in amount drainge so ERCP done on 08/06/14 with stent in CBD.  Patient then developed fever of 102.55F on 08/08/2014. The patient was started on empiric antibiotics and cultures were performed. He continued to have low-grade fevers. As result, repeat CT of the abdomen and pelvis was performed on 08/11/14. In addition, the patient has increased drainage from [place] again on 08/10/14.   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  - Colon mass confirmed by colonoscopy on 07/29/2014. Bleeding thought to be secondary to this large colon mass. - During this hospitalization, patient has received 4 units of PRBC blood transfusion. He reports no bleeding since. - Hemoglobin 7.5 on 08/09/2014.  -Hgb has largely remained stable; Baseline Hgb 7-8  New Fever-->HAP/HCAP vs neoplastic fever/Acute respiratory failure -previously finished zosyn on 08/07/14 for HCAP -102.55F@1320  on 08/08/14 -secondary to HAP but question if infiltrates  represent tumor -may need thoracocentesis r/o empyema if fever returns vs malignant effusion -blood cultures x 2 sets--neg -UA--no pyuria -08/08/14--Cdiff-- NEG -JP drain--20cc in past 24 hrs -check lactic acid--1.3 -started imipenem 08/08/14 -start vancomycin 08/08/14-->d/c on 3/9 -pt continued to have fever 100.55F--in the setting of slight increase in abdominal pain and increased JP drainage, repeat CT abdomen and pelvis -3/9 CT abd/pelvs--Inflammatory stranding at the celiac axis and root of the mesentery, favor due to acute pancreatitis; Postoperative changes from right hemicolectomy and cholecystectomy; bilateral pleural effusions with new bilateral lower lobe consolidations -do NOT feel pt has pancreatitis clinically (or biochemically--lipase 41) -GI reconsulted--appreciate input Colonic mass / colon cancer (adenocarcinoma) -Right colon tumor, peritoneal implants and right upper quadrant, right lateral abdominal wall, and on gallbladder, cholelithiasis - Status post colonoscopy on 07/29/2014  -followed by exploratory laparotomy, extended right colectomy, biopsy of peritoneal nodules and cholecystectomy on 07/30/14. 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 -Patient will follow-up with oncology per scheduled appointment on discharge. -08/10/14--appreciate Dr. Benay Spice consult-->PET after d/c, place port-a-cath, start iron Bile leak -08/05/14--pt had increase bile color drainage from JP again - HIDA showed bile leak.  - 08/06/14 ERCP (Dr. Boykin Peek leak from biliary system-->stent placed -08/10/14--JP drain--440cc -GI reconsulted again 3/9 Essential hypertension  - BP at goal, continue atenolol.  Hyperlipidemia  - Statin on hold until PO intake improves.  Anxiety  - Continue Xanax.  Tobacco abuse/COPD - Continue nicotine patch. -at least 30 pack year hx  Hypokalemia  - Secondary to GI losses. Supplemented. - Check BMP in am.  Hx  of ETOH use quit 4 years ago DVT Prophylaxis  - Heparin subcutaneous ordered.  Family communication--wife updated at bedside  3/9 Dispo--home when stable--not ready    Procedures/Studies: Dg Chest 2 View  08/08/2014   CLINICAL DATA:  59 year old male with shortness of breath, tachypnea and weakness for several months. Right colectomy on 07/30/2014 for colon cancer.  EXAM: CHEST  2 VIEW  COMPARISON:  08/01/2014 and prior radiographs dating back to 07/25/2014  FINDINGS: Cardiomediastinal silhouette is unchanged.  Decreased bilateral interstitial and airspace opacities noted.  Bibasilar opacities again noted, improved on the right.  This is a low volume film.  There is no evidence of pneumothorax or acute bony abnormality.  Small bilateral pleural effusions are again identified.  IMPRESSION: Decreased bilateral interstitial and airspace opacities which may represent improved edema or infection.  Bibasilar opacities again noted, improved on the right.  Small bilateral pleural effusions again identified.   Electronically Signed   By: Margarette Canada M.D.   On: 08/08/2014 17:11   Dg Chest 2 View  07/25/2014   CLINICAL DATA:  Acute shortness of breath with right chest and rib pain for 1 week. lifting injury.  EXAM: CHEST  2 VIEW  COMPARISON:  None.  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 Normal heart size and vascularity. Minor central bronchitic change without focal pneumonia, collapse or consolidation. No edema, effusion or pneumothorax. Trachea midline. No acute osseous finding.  IMPRESSION: Mild central bronchitic change.  Anterior eventration of the right hemidiaphragm  No superimposed acute process.   Electronically Signed   By: Jerilynn Mages.  Shick M.D.   On: 07/25/2014 09:03   Nm Hepatobiliary Including Gb  08/02/2014   CLINICAL DATA:  Status post cholecystectomy 07/30/2014  EXAM: NUCLEAR MEDICINE HEPATOBILIARY IMAGING  TECHNIQUE: Sequential images of the abdomen were obtained out to 60  minutes following intravenous administration of radiopharmaceutical.  RADIOPHARMACEUTICALS:  5.5 [employer] [id number] Choletec  COMPARISON:  None.  FINDINGS: Flow study shows uniform distribution of radioactivity in the liver. Sequential imaging of the liver and abdomen over a period of 60 minutes shows uniform uptake of the tracer in the liver.  Prior cholecystectomy. Filling of the common bile duct. Radiotracer uptake is present in the small bowel at 20 minutes. There is abnormal radiotracer collection adjacent to the inferior margin of the left hepatic lobe most concerning for a bile leak.  At the end of 1-hour, there is relatively poor clearance of activity from the liver.  IMPRESSION: 1. Abnormal radiotracer collection adjacent to the inferior margin of the left hepatic lobe most concerning for a bile leak. These results were called by telephone at the time of interpretation on 08/02/2014 at 12:01 pm to Dr. Jacquelynn Cree , who verbally acknowledged these results.   Electronically Signed   By: Kathreen Devoid   On: 08/02/2014 12:18   US Abdomen Complete  07/25/2014   CLINICAL DATA:  Acute right upper quadrant pain for 1 week.  EXAM: ULTRASOUND ABDOMEN COMPLETE  COMPARISON:  None.  FINDINGS: Gallbladder: Intraluminal echogenic mobile abnormalities within the gallbladder without shadowing measure 1 cm in greatest diameter. These likely represent non shadowing stones. Normal wall thickness measuring 2.1 mm. No significant Murphy's sign. Small amount of free fluid along the right liver and gallbladder in the right upper quadrant remains nonspecific.  Common bile duct: Diameter: 2.8 mm  Liver: There are scattered small hypo echoic probable cysts throughout the liver. Left hepatic lobe anterior lateral hypoechoic minimally complex cyst measures 1.4 x 1.1 x 1.1 cm. Patent portal vein with normal hepatopetal flow. No biliary dilatation.  Right hepatic lobe hypoechoic cyst along  the lateral inferior margin measures 12 x 13  x 14 mm.  IVC: Not visualized, obscured by bowel gas  Pancreas: Not visualized, obscured by bowel gas  Spleen: Limited visualization. 5.7 cm length. No definite abnormality.  Right Kidney: Length: 10.9 cm. Echogenicity within normal limits. No mass or hydronephrosis visualized.  Left Kidney: Length: 11.9 cm. Echogenicity within normal limits. No mass or hydronephrosis visualized.  Abdominal aorta: No aneurysm visualized.  Other findings: None.  IMPRESSION: Probable non shadowing gallstones. No definite wall thickening or significant Murphy's sign.  Small amount of free fluid along the right liver margin and gallbladder remains nonspecific.  Obscured IVC, pancreas, and spleen by bowel gas.  Scattered hepatic cysts   Electronically Signed   By: Jerilynn Mages.  Shick M.D.   On: 07/25/2014 13:28   Ct Abdomen Pelvis W Contrast  08/11/2014   ADDENDUM REPORT: 08/11/2014 08:32  ADDENDUM: Study discussed by telephone with Dr. Shanon Brow Estelene Carmack on 08/11/2014 at 0816 hours.   Electronically Signed   By: Genevie Ann M.D.   On: 08/11/2014 08:32   08/11/2014   CLINICAL DATA:  59 year old male with lower GI bleed, lower abdominal pain. Current history of colon cancer resection 07/30/2014. Postoperative bile leak and pneumonia. Initial encounter.  EXAM: CT ABDOMEN AND PELVIS WITH CONTRAST  TECHNIQUE: Multidetector CT imaging of the abdomen and pelvis was performed using the standard protocol following bolus administration of intravenous contrast.  CONTRAST:  163mL OMNIPAQUE IOHEXOL 300 MG/ML  SOLN  COMPARISON:  Preoperative CT Abdomen and Pelvis 07/28/2014  FINDINGS: New moderate right greater than left layering pleural effusions. New bilateral lower lobe consolidation and compressive atelectasis. Patchy opacity in the lingula and right middle lobe are new. No pericardial effusion.  No acute osseous abnormality identified.  Small to moderate volume of pelvic free fluid with simple fluid densitometry. Unremarkable bladder. Mild presacral stranding.  Negative distal colon.  Mild stranding in both pericolic gutters. Negative splenic flexure. Residual transverse colon with retained stool. Small to large bowel anastomosis at the level of the mid transverse with surgical absence of the more proximal colon. Overlying midline ventral abdominal wall incision with skin staples. Right abdominal percutaneous postoperative drain which terminates near the anastomosis. The gallbladder also now is surgically absent. CBD stent in place terminating in the duodenum. Small volume perihepatic free fluid.  Small round hypodense areas in the left and right hepatic lobes are stable. There is a new linear area of hypodensity along the periphery of the right lobe which may be postoperative in nature. Portal venous system is patent. Major arterial structures are patent. Aortoiliac calcified atherosclerosis noted.  The spleen is within normal limits. There is confluent stranding in the lesser sac nearest the pancreas and celiac vasculature. Pancreatic enhancement is preserved. No focal abnormality of the stomach. Stranding continues into the root of the small bowel mesentery. Oral contrast has reached the distal small bowel.  Negative adrenal glands. Renal enhancement and contrast excretion within normal limits. No pneumoperitoneum identified.  IMPRESSION: 1. Inflammatory stranding at the celiac axis and root of the mesentery, favor due to acute pancreatitis. No pancreatic necrosis or organized fluid collection identified. CBD stent in place. 2. Postoperative changes from right hemicolectomy and cholecystectomy for treatment of hepatic flexure poorly differentiated adenocarcinoma. No evidence of bowel obstruction. Postoperative drain in place with small volume perihepatic and small to moderate volume pelvic free fluid which appears simple in nature. 3. Moderate pleural effusions with new lower lobe consolidation and bilateral middle lobe patchy opacity suspicious  for pneumonia.   Electronically Signed: By: Genevie Ann M.D. On: 08/11/2014 08:07   Ct Abdomen Pelvis W Contrast  07/28/2014   ADDENDUM REPORT: 07/28/2014 11:16  ADDENDUM: Study discussed by telephone with Dr. Quintella Reichert on 07/28/2014 at 1105 hours.   Electronically Signed   By: Genevie Ann M.D.   On: 07/28/2014 11:16   07/28/2014   CLINICAL DATA:  59 year old male with 2 weeks of right side abdominal pain. Recently diagnosed with gallstones. Initial encounter.  EXAM: CT ABDOMEN AND PELVIS WITH CONTRAST  TECHNIQUE: Multidetector CT imaging of the abdomen and pelvis was performed using the standard protocol following bolus administration of intravenous contrast.  CONTRAST:  41mL OMNIPAQUE IOHEXOL 300 MG/ML SOLN, 64mL OMNIPAQUE IOHEXOL 300 MG/ML SOLN, 111mL OMNIPAQUE IOHEXOL 300 MG/ML SOLN  COMPARISON:  Abdomen ultrasound 07/25/2014.  FINDINGS: Minor atelectasis at the right lung base. No pericardial or pleural effusion.  No acute osseous abnormality identified.  No pelvic free fluid. Retained stool in the rectum. Negative bladder. Moderately redundant sigmoid colon with retained stool. Negative left colon. Moderately redundant splenic flexure.  Abnormal hepatic flexure with elongated circumferential wall thickening and luminal narrowing, but also extensive surrounding hepatic flexure mesentery nodularity and irregular intermediate density. Discrete rounded adjacent mesenteric nodes measuring up to 9 mm diameter are present.  The abnormal pericolic fat continues to the gallbladder fossa. However, the gallbladder itself does not appear distended or definitely inflamed. There is cholelithiasis evident at the gallbladder fundus.  The upstream ascending colon and cecum than have a more normal appearance. There is a gas-filled laterally situated appendix which tracks cephalad. The terminal ileum is within normal limits. Oral contrast has not yet reached the distal small bowel. No dilated small bowel loops. Negative stomach. The second  portion of the duodenum also is in close proximity to the abnormal hepatic flexure.  Trace perihepatic free fluid. Multiple nonspecific small low-density lesions in liver such as the 14 mm right hepatic lobe entity on series 2, image 27. Also 8 mm lesion in the left lobe on image 27. Larger 14 mm lesion in the lateral left lobe on image 33.  Spleen, pancreas, right adrenal gland, portal venous system, and kidneys are within normal limits.  There is intermediate density nodularity adjacent to the medial lobe of the left adrenal gland which is nonspecific. To a degree this resembles an enlarged retroperitoneal lymph node. There is a similar prominent node situated between the cava and right corona of the diaphragm measuring 6 mm.  No pneumoperitoneum. Aortoiliac calcified atherosclerosis noted. Major arterial structures in the abdomen and pelvis are patent.  IMPRESSION: 1. Markedly abnormal hepatic flexure with wall thickening along a 10-12 cm length but extensive surrounding mesenteric nodularity, and increased regional lymph nodes. The appearance is atypical for acute colitis and highly suspicious for adenocarcinoma. GI consultation for followup colonoscopy recommended. 2. Small volume perihepatic fluid. Multiple up to 14 mm indeterminate low-density lesions in the liver. Several prominent but indeterminate retroperitoneal lymph nodes in the upper abdomen. 3. Abnormal fat stranding/nodularity continues to the gallbladder fossa, but the gallbladder does not appear inflamed.  Electronically Signed: By: Genevie Ann M.D. On: 07/28/2014 11:00   Dg Chest Port 1 View  08/01/2014   CLINICAL DATA:  Postoperative bile leak.  Hypertension.  EXAM: PORTABLE CHEST - 1 VIEW  COMPARISON:  July 30, 2014.  FINDINGS: Stable cardiomediastinal silhouette. Nasogastric tube is seen entering stomach. Right internal jugular catheter is stable in position with tip overlying expected position of  the SVC. No pneumothorax is noted. Increased  left perihilar opacity is noted concerning for pneumonia. Significantly increased opacity is noted in the right middle lobe concerning for pneumonia. Right upper lobe opacity is also noted concerning for pneumonia. No significant pleural effusion is noted. Bony thorax is intact.  IMPRESSION: Increased bilateral upper lobe and right middle lobe opacities are noted most consistent with pneumonia or possibly edema. Followup radiographs are recommended.   Electronically Signed   By: Marijo Conception, M.D.   On: 08/01/2014 10:35   Dg Chest Port 1 View  07/30/2014   CLINICAL DATA:  Evaluate PICC line placement  EXAM: PORTABLE CHEST - 1 VIEW  COMPARISON:  07/25/2014  FINDINGS: Low lung volumes. Mild right basilar atelectasis. No focal consolidation. No pleural effusion or pneumothorax.  The heart is normal in size.  Right IJ venous catheter terminates in the mid SVC.  Enteric tube courses into the proximal stomach.  Cholecystectomy clips.  IMPRESSION: Right IJ venous catheter terminates in the mid SVC. No pneumothorax.   Electronically Signed   By: Julian Hy M.D.   On: 07/30/2014 18:34   Dg Ercp  08/06/2014   CLINICAL DATA:  Bile leak  EXAM: ERCP  TECHNIQUE: Multiple spot images obtained with the fluoroscopic device and submitted for interpretation post-procedure.  COMPARISON:  None.  FINDINGS: Images demonstrate cannulation of the common bile duct and contrast filling the biliary tree. A biliary stent is present across the distal common bile duct.  IMPRESSION: Biliary stent placement.  These images were submitted for radiologic interpretation only. Please see the procedural report for the amount of contrast and the fluoroscopy time utilized.   Electronically Signed   By: Marybelle Killings M.D.   On: 08/06/2014 12:46         Subjective: Patient's feels generally weak. He denies any fevers, chills, chest pain, nausea, vomiting, diarrhea. He has some epigastric abdominal pain. No hemoptysis.    Objective: Filed Vitals:   08/10/14 2007 08/10/14 2026 08/11/14 0540 08/11/14 1439  BP:  138/63 118/63 136/63  Pulse:  71 62 64  Temp: 99.9 F (37.7 C) 98.6 F (37 C) 97.9 F (36.6 C) 98 F (36.7 C)  TempSrc: Oral Oral Oral Oral  Resp:  20 18 18   Height:      Weight:      SpO2:  97% 100% 100%    Intake/Output Summary (Last 24 hours) at 08/11/14 1921 Last data filed at 08/11/14 1844  Gross per 24 hour  Intake   2380 ml  Output   1655 ml  Net    725 ml   Weight change:  Exam:   General:  Pt is alert, follows commands appropriately, not in acute distress  HEENT: No icterus, No thrush,  [place]/AT  Cardiovascular: RRR, S1/S2, no rubs, no gallops  Respiratory: Bibasilar crackles. No wheezing. Good air movement.  Abdomen: Soft/+BS, epigastric tenderness without any rebound non distended, no guarding  Extremities: No edema, No lymphangitis, No petechiae, No rashes, no synovitis  Data Reviewed: Basic Metabolic Panel:  Recent Labs Lab 08/08/14 0515 08/09/14 0440 08/10/14 0435 08/11/14 0440  NA 141 135 138 137  K 3.8 3.3* 3.4* 3.0*  CL 110 105 108 102  CO2 22 24 26 26   GLUCOSE 105* 101* 88 93  BUN 10 11 14 11   CREATININE 0.44* 0.57 0.49* 0.46*  CALCIUM 7.8* 7.2* 7.4* 7.5*  MG  --   --  2.1 2.2   Liver Function Tests:  Recent  Labs Lab 08/10/14 0435 08/11/14 0440  AST 19 16  ALT 23 15  ALKPHOS 139* 124*  BILITOT 0.7 0.1*  PROT 5.4* 5.2*  ALBUMIN 1.7* 1.7*    Recent Labs Lab 08/11/14 0440  LIPASE 41  AMYLASE 45   No results for input(s): AMMONIA in the last 168 hours. CBC:  Recent Labs Lab 08/07/14 0408 08/08/14 0515 08/09/14 0440 08/10/14 0435 08/11/14 0440  WBC 8.6 11.5* 10.4 8.1 9.1  NEUTROABS 6.7  --   --   --   --   HGB 7.5* 7.5* 7.5* 7.7* 7.5*  HCT 25.2* 24.7* 24.6* 25.7* 24.9*  MCV 81.0 80.7 79.1 79.8 79.3  PLT 447* 567* 456* 451* 392   Cardiac Enzymes: No results for input(s): CKTOTAL, CKMB, CKMBINDEX, TROPONINI in the last  168 hours. BNP: Invalid input(s): POCBNP CBG: No results for input(s): GLUCAP in the last 168 hours.  Recent Results (from the past 240 hour(s))  MRSA PCR Screening     Status: None   Collection Time: 08/03/14 11:19 PM  Result Value Ref Range Status   MRSA by PCR NEGATIVE NEGATIVE Final    Comment:        The GeneXpert MRSA Assay (FDA approved for NASAL specimens only), is one component of a comprehensive MRSA colonization surveillance program. It is not intended to diagnose MRSA infection nor to guide or monitor treatment for MRSA infections.   Clostridium Difficile by PCR     Status: None   Collection Time: 08/08/14  6:06 AM  Result Value Ref Range Status   C difficile by pcr NEGATIVE NEGATIVE Final  Culture, blood (routine x 2)     Status: None (Preliminary result)   Collection Time: 08/08/14  1:47 PM  Result Value Ref Range Status   Specimen Description BLOOD LEFT ASSIST CONTROL  Final   Special Requests BOTTLES DRAWN AEROBIC ONLY 2CC  Final   Culture   Final           BLOOD CULTURE RECEIVED NO GROWTH TO DATE CULTURE WILL BE HELD FOR 5 DAYS BEFORE ISSUING A FINAL NEGATIVE REPORT Performed at [facility]    Report Status PENDING  Incomplete  Culture, blood (routine x 2)     Status: None (Preliminary result)   Collection Time: 08/08/14  2:30 PM  Result Value Ref Range Status   Specimen Description BLOOD LEFT ARM  Final   Special Requests BOTTLES DRAWN AEROBIC ONLY 2CC  Final   Culture   Final           BLOOD CULTURE RECEIVED NO GROWTH TO DATE CULTURE WILL BE HELD FOR 5 DAYS BEFORE ISSUING A FINAL NEGATIVE REPORT Performed at [facility]    Report Status PENDING  Incomplete  Urine culture     Status: None   Collection Time: 08/08/14  5:31 PM  Result Value Ref Range Status   Specimen Description URINE, CLEAN CATCH  Final   Special Requests NONE  Final   Colony Count NO GROWTH Performed at [facility]   Final   Culture NO  GROWTH Performed at [facility]   Final   Report Status 08/09/2014 FINAL  Final     Scheduled Meds: . ALPRAZolam  1 mg Oral TID  . atenolol  100 mg Oral Daily  . heparin subcutaneous  5,000 Units Subcutaneous 3 times per day  . imipenem-cilastatin  500 mg Intravenous Q6H  . multivitamin  5 mL Oral QHS  . pantoprazole  40  mg Oral QHS  . vancomycin  1,000 mg Intravenous Q8H   Continuous Infusions: . 0.9 % NaCl with KCl 40 mEq / L 125 mL/hr (08/11/14 1042)     Zoelle Markus, DO  [facility] Pager [phone]  If 7PM-7AM, please contact night-coverage [email or web address] Password [email or web address] 08/11/2014, 7:21 PM   LOS: 14 days

## 2014-08-11 NOTE — Progress Notes (Signed)
PT Cancellation Note  Patient Details Name: Donald Fry MRN: [id number] DOB: 09-02-1955   Cancelled Treatment:    Reason Eval/Treat Not Completed: Other (comment)  Pt declines at this time, waiting on anxiety meds (RN notified).  Pt and spouse report pt up ambulating in hallway earlier this morning.   [name],[name] 08/11/2014, 2:00 PM Carmelia Bake, PT, DPT 08/11/2014 Pager: [phone]

## 2014-08-11 NOTE — Progress Notes (Signed)
5 Days Post-Op  Subjective: CT yesterday show pancreatitis, fluid from drain has increased markedly.  It is now milky white color.  He says he had a rough night and they gave him tylenol and benadryl,  He isn't really sure what he was treated for.  His Sats did drop to 82 % off O2 so he is back on O2.  Objective: Vital signs in last 24 hours: Temp:  [97.9 F (36.6 C)-99.9 F (37.7 C)] 97.9 F (36.6 C) (03/09 0540) Pulse Rate:  [62-71] 62 (03/09 0540) Resp:  [18-20] 18 (03/09 0540) BP: (112-138)/(60-63) 118/63 mmHg (03/09 0540) SpO2:  [82 %-100 %] 100 % (03/09 0540) Last BM Date: 08/09/14 440 from the drain yesterday 600 Po recorded Afebrile, VSS  K+ 3.0, WBC normal at 9.1.   CT scan abdomin yesterday shows stranding celiac axis, mesentery, probable pancreatitis, no necrosis, CBD stent, normal post op changes with drain in place.  Pleural effusions bilat middle  lobe consolidation with probable pneumonia. Intake/Output from previous day: 03/08 0701 - 03/09 0700 In: 1600 [P.O.:600; IV Piggyback:1000] Out: 1740 [Urine:1300; Drains:440] Intake/Output this shift: Total I/O In: -  Out: 190 [Urine:175; Drains:15]  General appearance: alert, cooperative, no distress and comfortable on O2, no abdominal complaints. Resp: rales both bases, no wheezing GI: soft, + BS, sore from surgery, but no real abdominal pain, no distension, + BS  Incision is healing well.  Drain has changed to a milky white color. Extremities:  No pain, no real edema.  Lab Results:   Recent Labs  08/10/14 0435 08/11/14 0440  WBC 8.1 9.1  HGB 7.7* 7.5*  HCT 25.7* 24.9*  PLT 451* 392    BMET  Recent Labs  08/10/14 0435 08/11/14 0440  NA 138 137  K 3.4* 3.0*  CL 108 102  CO2 26 26  GLUCOSE 88 93  BUN 14 11  CREATININE 0.49* 0.46*  CALCIUM 7.4* 7.5*   PT/INR No results for input(s): LABPROT, INR in the last 72 hours.   Recent Labs Lab 08/10/14 0435  AST 19  ALT 23  ALKPHOS 139*  BILITOT 0.7   PROT 5.4*  ALBUMIN 1.7*     Lipase     Component Value Date/Time   LIPASE 20 07/28/2014 0830     Studies/Results: Ct Abdomen Pelvis W Contrast  08/11/2014   ADDENDUM REPORT: 08/11/2014 08:32  ADDENDUM: Study discussed by telephone with Dr. Shanon Brow TAT on 08/11/2014 at 0816 hours.   Electronically Signed   By: Genevie Ann M.D.   On: 08/11/2014 08:32   08/11/2014   CLINICAL DATA:  59 year old male with lower GI bleed, lower abdominal pain. Current history of colon cancer resection 07/30/2014. Postoperative bile leak and pneumonia. Initial encounter.  EXAM: CT ABDOMEN AND PELVIS WITH CONTRAST  TECHNIQUE: Multidetector CT imaging of the abdomen and pelvis was performed using the standard protocol following bolus administration of intravenous contrast.  CONTRAST:  125mL OMNIPAQUE IOHEXOL 300 MG/ML  SOLN  COMPARISON:  Preoperative CT Abdomen and Pelvis 07/28/2014  FINDINGS: New moderate right greater than left layering pleural effusions. New bilateral lower lobe consolidation and compressive atelectasis. Patchy opacity in the lingula and right middle lobe are new. No pericardial effusion.  No acute osseous abnormality identified.  Small to moderate volume of pelvic free fluid with simple fluid densitometry. Unremarkable bladder. Mild presacral stranding. Negative distal colon.  Mild stranding in both pericolic gutters. Negative splenic flexure. Residual transverse colon with retained stool. Small to large bowel anastomosis at the  level of the mid transverse with surgical absence of the more proximal colon. Overlying midline ventral abdominal wall incision with skin staples. Right abdominal percutaneous postoperative drain which terminates near the anastomosis. The gallbladder also now is surgically absent. CBD stent in place terminating in the duodenum. Small volume perihepatic free fluid.  Small round hypodense areas in the left and right hepatic lobes are stable. There is a new linear area of hypodensity along the  periphery of the right lobe which may be postoperative in nature. Portal venous system is patent. Major arterial structures are patent. Aortoiliac calcified atherosclerosis noted.  The spleen is within normal limits. There is confluent stranding in the lesser sac nearest the pancreas and celiac vasculature. Pancreatic enhancement is preserved. No focal abnormality of the stomach. Stranding continues into the root of the small bowel mesentery. Oral contrast has reached the distal small bowel.  Negative adrenal glands. Renal enhancement and contrast excretion within normal limits. No pneumoperitoneum identified.  IMPRESSION: 1. Inflammatory stranding at the celiac axis and root of the mesentery, favor due to acute pancreatitis. No pancreatic necrosis or organized fluid collection identified. CBD stent in place. 2. Postoperative changes from right hemicolectomy and cholecystectomy for treatment of hepatic flexure poorly differentiated adenocarcinoma. No evidence of bowel obstruction. Postoperative drain in place with small volume perihepatic and small to moderate volume pelvic free fluid which appears simple in nature. 3. Moderate pleural effusions with new lower lobe consolidation and bilateral middle lobe patchy opacity suspicious for pneumonia.  Electronically Signed: By: Genevie Ann M.D. On: 08/11/2014 08:07    Medications: . atenolol  100 mg Oral Daily  . heparin subcutaneous  5,000 Units Subcutaneous 3 times per day  . imipenem-cilastatin  500 mg Intravenous Q6H  . multivitamin  5 mL Oral QHS  . pantoprazole  40 mg Oral QHS  . vancomycin  1,000 mg Intravenous Q8H     Assessment/Plan 1. Right colon tumor, peritoneal implants and right upper quadrant, right lateral abdominal wall, and on gallbladder, cholelithiasis 1A. Laparoscopy converted to exploratory laparotomy, extended right colectomy, biopsy of peritoneal nodules on diaphragm with closure of diaphragm, cholecystectomy, POD 8 Dr.  Zella Richer. 1B. Bile leak, no obvious leak seen on ERCP 08/06/14, with stent placement by Dr. Deatra Ina. -start clear liquids, hopefully can advance tomorrow 2. Anemia/Leukocytosis/fevers 3. Hypertension 4. Tobacco use 39 years 5. Hx of ETOH use quit 4 years ago 6. Dyslipidemia  7. DVT prophylaxis - SCD/heparin 8. He has had 9 days of Zosyn since admit and is now on Vancomycin and cefotetan, this will be day 2.  9. Chronic benzodiazapine use - less anxious today 10. Pancreatitis/Pneumonia post op  11.  Hypokalemia will replace with IV fluids  Plan:  Dr. Carles Collet has made him NPO, I have added serum and peritoneal amylase to labs.  We checked this on 3/1 and it was normal.  Place him back on IV fluids, and will discuss need for nutrition with Dr. Carles Collet and Dr. Hassell Done.  Replace K+, and continue to follow.  I will let GI know also. Recheck labs in AM.    LOS: 14 days    Ludmilla Mcgillis 08/11/2014

## 2014-08-11 NOTE — Progress Notes (Signed)
When emptying patient's drain, noted to be disconnected from tube.  Large amount of drainage was on the bed sheet.  Cleaned and reconnected, measurable output from drain has been charted.  Will continue to monitor.

## 2014-08-12 ENCOUNTER — Ambulatory Visit: Payer: Self-pay

## 2014-08-12 ENCOUNTER — Ambulatory Visit: Payer: Self-pay | Admitting: Oncology

## 2014-08-12 LAB — COMPREHENSIVE METABOLIC PANEL
ALBUMIN: 1.8 g/dL — AB (ref 3.5–5.2)
ALK PHOS: 148 U/L — AB (ref 39–117)
ALT: 18 U/L (ref 0–53)
AST: 22 U/L (ref 0–37)
Anion gap: 8 (ref 5–15)
BUN: 8 mg/dL (ref 6–23)
CALCIUM: 7.7 mg/dL — AB (ref 8.4–10.5)
CHLORIDE: 107 mmol/L (ref 96–112)
CO2: 26 mmol/L (ref 19–32)
CREATININE: 0.46 mg/dL — AB (ref 0.50–1.35)
GFR calc non Af Amer: >90 mL/min (ref >90)
Glucose, Bld: 76 mg/dL (ref 70–99)
Potassium: 4 mmol/L (ref 3.5–5.1)
SODIUM: 141 mmol/L (ref 135–145)
TOTAL PROTEIN: 5.3 g/dL — AB (ref 6.0–8.3)
Total Bilirubin: 0.6 mg/dL (ref 0.3–1.2)

## 2014-08-12 LAB — CBC
HEMATOCRIT: 25.5 % — AB (ref 39.0–52.0)
HEMOGLOBIN: 7.6 g/dL — AB (ref 13.0–17.0)
MCH: 23.7 pg — ABNORMAL LOW (ref 26.0–34.0)
MCHC: 29.8 g/dL — ABNORMAL LOW (ref 30.0–36.0)
MCV: 79.4 fL (ref 78.0–100.0)
Platelets: 473 10*3/uL — ABNORMAL HIGH (ref 150–400)
RBC: 3.21 MIL/uL — ABNORMAL LOW (ref 4.22–5.81)
RDW: 19.8 % — AB (ref 11.5–15.5)
WBC: 10.6 10*3/uL — AB (ref 4.0–10.5)

## 2014-08-12 LAB — VANCOMYCIN, TROUGH: VANCOMYCIN TR: 9.5 ug/mL — AB (ref 10.0–20.0)

## 2014-08-12 LAB — LIPASE, BLOOD: Lipase: 27 U/L (ref 11–59)

## 2014-08-12 MED ORDER — VANCOMYCIN HCL 10 G IV SOLR
1250.0000 mg | Freq: Three times a day (TID) | INTRAVENOUS | Status: AC
  Administered 2014-08-12 – 2014-08-14 (×8): 1250 mg via INTRAVENOUS
  Filled 2014-08-12 (×8): qty 1250

## 2014-08-12 NOTE — Progress Notes (Signed)
PT Cancellation Note  Patient Details Name: LYNELL KUSSMAN MRN: [id number] DOB: [birth year]   Cancelled Treatment:    Reason Eval/Treat Not Completed: Pain limiting ability to participate Observed pt ambulating in hallway with NT earlier this morning.  OT just finished working with pt and pt requesting pain meds.   Perris Conwell,[name] 08/12/2014, 11:51 AM Carmelia Bake, PT, DPT 08/12/2014 Pager: [phone]

## 2014-08-12 NOTE — Progress Notes (Signed)
Dear Doctor: Rama This patient has been identified as a candidate for PICC for the following reason (s): drug pH or osmolality (causing phlebitis, infiltration in 24 hours) If you agree, please write an order for the indicated device. For any questions contact the Vascular Access Team at [phone] if no answer, please leave a message.  Thank you for supporting the early vascular access assessment program.

## 2014-08-12 NOTE — Progress Notes (Signed)
6 Days Post-Op  Subjective: Sounds like he had a better night, last PM.  Some back pain, no appetite, blames the smell of the hospital.  No pain or discomfort with eating.  Over all he looks good.  I encouraged him to walk more.    Objective: Vital signs in last 24 hours: Temp:  [98 F (36.7 C)-98.8 F (37.1 C)] 98 F (36.7 C) (03/10 0523) Pulse Rate:  [61-65] 65 (03/10 0523) Resp:  [18-20] 18 (03/10 0523) BP: (125-136)/(63-72) 135/72 mmHg (03/10 0523) SpO2:  [99 %-100 %] 100 % (03/10 0523) Last BM Date: 08/09/14 410 from the drain yesterday Afebrile, VSS Labs show elevated alk phos, but lipase and amylase are normal I don't see amylase from drain peritoneal fluid. Regular diet Intake/Output from previous day: 03/09 0701 - 03/10 0700 In: 2212.5 [I.V.:1412.5; IV Piggyback:800] Out: 3094 [Urine:975; Drains:410] Intake/Output this shift: Total I/O In: -  Out: 200 [Urine:200]  General appearance: alert, cooperative, no distress and tired Resp: rales both bases, left more than right. GI: soft, incision looks good, wound is healing nicely.  drain is back to a serous color,but still cloudy..  Bulb is currently almost empty.    Lab Results:   Recent Labs  08/11/14 0440 08/12/14 0545  WBC 9.1 10.6*  HGB 7.5* 7.6*  HCT 24.9* 25.5*  PLT 392 473*    BMET  Recent Labs  08/11/14 0440 08/12/14 0545  NA 137 141  K 3.0* 4.0  CL 102 107  CO2 26 26  GLUCOSE 93 76  BUN 11 8  CREATININE 0.46* 0.46*  CALCIUM 7.5* 7.7*   PT/INR No results for input(s): LABPROT, INR in the last 72 hours.   Recent Labs Lab 08/10/14 0435 08/11/14 0440 08/12/14 0545  AST 19 16 22   ALT 23 15 18   ALKPHOS 139* 124* 148*  BILITOT 0.7 0.1* 0.6  PROT 5.4* 5.2* 5.3*  ALBUMIN 1.7* 1.7* 1.8*     Lipase     Component Value Date/Time   LIPASE 27 08/12/2014 0545     Studies/Results: Ct Abdomen Pelvis W Contrast  08/11/2014   ADDENDUM REPORT: 08/11/2014 08:32  ADDENDUM: Study discussed by  telephone with Dr. Shanon Brow TAT on 08/11/2014 at 0816 hours.   Electronically Signed   By: Genevie Ann M.D.   On: 08/11/2014 08:32   08/11/2014   CLINICAL DATA:  59 year old male with lower GI bleed, lower abdominal pain. Current history of colon cancer resection 07/30/2014. Postoperative bile leak and pneumonia. Initial encounter.  EXAM: CT ABDOMEN AND PELVIS WITH CONTRAST  TECHNIQUE: Multidetector CT imaging of the abdomen and pelvis was performed using the standard protocol following bolus administration of intravenous contrast.  CONTRAST:  175m OMNIPAQUE IOHEXOL 300 MG/ML  SOLN  COMPARISON:  Preoperative CT Abdomen and Pelvis 07/28/2014  FINDINGS: New moderate right greater than left layering pleural effusions. New bilateral lower lobe consolidation and compressive atelectasis. Patchy opacity in the lingula and right middle lobe are new. No pericardial effusion.  No acute osseous abnormality identified.  Small to moderate volume of pelvic free fluid with simple fluid densitometry. Unremarkable bladder. Mild presacral stranding. Negative distal colon.  Mild stranding in both pericolic gutters. Negative splenic flexure. Residual transverse colon with retained stool. Small to large bowel anastomosis at the level of the mid transverse with surgical absence of the more proximal colon. Overlying midline ventral abdominal wall incision with skin staples. Right abdominal percutaneous postoperative drain which terminates near the anastomosis. The gallbladder also now is surgically  absent. CBD stent in place terminating in the duodenum. Small volume perihepatic free fluid.  Small round hypodense areas in the left and right hepatic lobes are stable. There is a new linear area of hypodensity along the periphery of the right lobe which may be postoperative in nature. Portal venous system is patent. Major arterial structures are patent. Aortoiliac calcified atherosclerosis noted.  The spleen is within normal limits. There is  confluent stranding in the lesser sac nearest the pancreas and celiac vasculature. Pancreatic enhancement is preserved. No focal abnormality of the stomach. Stranding continues into the root of the small bowel mesentery. Oral contrast has reached the distal small bowel.  Negative adrenal glands. Renal enhancement and contrast excretion within normal limits. No pneumoperitoneum identified.  IMPRESSION: 1. Inflammatory stranding at the celiac axis and root of the mesentery, favor due to acute pancreatitis. No pancreatic necrosis or organized fluid collection identified. CBD stent in place. 2. Postoperative changes from right hemicolectomy and cholecystectomy for treatment of hepatic flexure poorly differentiated adenocarcinoma. No evidence of bowel obstruction. Postoperative drain in place with small volume perihepatic and small to moderate volume pelvic free fluid which appears simple in nature. 3. Moderate pleural effusions with new lower lobe consolidation and bilateral middle lobe patchy opacity suspicious for pneumonia.  Electronically Signed: By: Genevie Ann M.D. On: 08/11/2014 08:07    Medications: . ALPRAZolam  1 mg Oral TID  . atenolol  100 mg Oral Daily  . heparin subcutaneous  5,000 Units Subcutaneous 3 times per day  . imipenem-cilastatin  500 mg Intravenous Q6H  . multivitamin  5 mL Oral QHS  . pantoprazole  40 mg Oral QHS  . vancomycin  1,000 mg Intravenous Q8H   . 0.9 % NaCl with KCl 40 mEq / L 125 mL/hr (08/11/14 1042)   Assessment/Plan 1. Right colon tumor, peritoneal implants and right upper quadrant, right lateral abdominal wall, and on gallbladder, cholelithiasis 1A. Laparoscopy converted to exploratory laparotomy, extended right colectomy, biopsy of peritoneal nodules on diaphragm with closure of diaphragm, cholecystectomy,07/30/12,  POD 13, Dr. Zella Richer. 1B. Bile leak, no obvious leak seen on ERCP 08/06/14, with stent placement by Dr. Deatra Ina. -start clear liquids, hopefully can  advance tomorrow 2. Anemia/Leukocytosis/fevers 3. Hypertension 4. Tobacco use 39 years 5. Hx of ETOH use quit 4 years ago 6. Dyslipidemia  7. DVT prophylaxis - SCD/heparin 8. He has had 9 days of Zosyn since admit and is now on Vancomycin and cefotetan, this will be day 5. 9. Chronic benzodiazapine use - less anxious today 10. Pneumonia post op 11. Hypokalemia will replace with IV fluids 12.  Concern for pancreatitis based on CT scan 08/10/14.  Not really consistent with exam and chemical studies.  Fluid is clearer today.  Plan:  He is on a regular diet, K+  Replaced.  Day 5 of treatment for pneumonia.  Drain fluid is clearer.  Peritoneal fluid results still not back.  I will decrease his IV, continue to mobilize.  I think we can get staples out tomorrow, and steri strip wound.      LOS: 15 days    Pacen Watford 08/12/2014

## 2014-08-12 NOTE — Progress Notes (Signed)
Progress Note   NYZIER BOIVIN MEQ:[id number] DOB: 1955/09/24 DOA: 07/28/2014 PCP: [name], MD   Brief Narrative:   CALUB TARNOW is an 59 y.o. male with a PMH of hyperlipidemia, anemia, hypertension and tobacco abuse who was admitted 07/28/14 with a chief complaint of a 2 day history of right upper quadrant abdominal pain. Patient also reported having ongoing dyspnea on exertion for several months prior to the admission and unintentional weight loss. Evaluation on the admission included CT abdomen which showed a possible colon cancer near the hepatic flexure with surrounding lymphadenopathy. Patient underwent colonoscopy 07/29/2014 followed by surgical resection of the colon cancer 07/30/2014. Postoperative course has been complicated with bile leak and healthcare associated pneumonia. Additionally, JP on 08/05/14 with bile color and has increased in amount drainge so ERCP done on 08/06/14 with stent in CBD. Patient then developed fever of 102.56F on 08/08/2014. The patient was started on empiric antibiotics and cultures were performed. He continued to have low-grade fevers. As result, repeat CT of the abdomen and pelvis was performed on 08/11/14. In addition, the patient has increased drainage from [place] again on 08/10/14.  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  - Status post exploratory laparotomy, extended right colectomy, biopsy of peritoneal nodules and cholecystectomy on 07/30/14. 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 - During this hospitalization, patient has received 4 units of PRBC blood transfusion. He reports no bleeding since. - Hemoglobin 7.5 on 08/09/2014.  -Hgb has largely remained stable; Baseline Hgb 7-8. -Patient will follow-up with oncology per scheduled  appointment on discharge. -08/10/14--appreciate Dr. Benay Spice consult-->PET after d/c, place port-a-cath, start iron.  Active Problems: New Fever-->HAP/HCAP vs neoplastic fever/Acute respiratory failure -Previously finished zosyn on 08/07/14 for HCAP but spiked fever of 102.56F@1320  on 08/08/14, thought to be from HAP. -May need thoracocentesis r/o empyema if fever returns vs malignant effusion. -Blood cultures x 2 sets--neg; UA--no pyuria; 08/08/14--Cdiff-- NEG.  No elevation of lactic acid. -Imipenem and vancomycin started 08/08/14, vancomycin discontinued 08/11/14. -pt continued to have fever 100.56F--in the setting of slight increase in abdominal pain and increased JP drainage, repeat CT abdomen and pelvis 3/9 showed Inflammatory stranding at the celiac axis and root of the mesentery, favor due to acute pancreatitis; Postoperative changes from right hemicolectomy and cholecystectomy; bilateral pleural effusions with new bilateral lower lobe consolidations. -Patient not felt to have pancreatitis clinically (or biochemically--lipase 41). -GI reconsulted--appreciate input, peritonel amylase pending.  Bile leak -08/05/14--pt had increase bile color drainage from JP again - HIDA showed bile leak.  - 08/06/14 ERCP (Dr. Boykin Peek leak from biliary system-->stent placed. -08/10/14--JP drain--440cc -GI reconsulted again 08/11/14.  Essential hypertension  - BP at goal, continue atenolol.  Hyperlipidemia  - Statin on hold until PO intake improves.  Anxiety  - Continue Xanax.  Wife thinks patient is depressed and may benefit from an antidepressant. We'll discuss with him.  Tobacco abuse/COPD -Continue nicotine patch.  Hypokalemia  - Secondary to GI losses. Supplemented.  Hx of ETOH use quit 4 years ago - Not active problem at present.  DVT Prophylaxis  - Heparin subcutaneous ordered.  Code Status: Full. Family Communication: Wife updated at the bedside. Disposition Plan: Home when  stable.   IV Access:    Peripheral IV   Procedures and diagnostic studies:  Dg Chest 2 View  08/08/2014   CLINICAL DATA:  59 year old male with shortness of breath, tachypnea and weakness for several months. Right colectomy on 07/30/2014 for colon cancer.  EXAM: CHEST  2 VIEW  COMPARISON:  08/01/2014 and prior radiographs dating back to 07/25/2014  FINDINGS: Cardiomediastinal silhouette is unchanged.  Decreased bilateral interstitial and airspace opacities noted.  Bibasilar opacities again noted, improved on the right.  This is a low volume film.  There is no evidence of pneumothorax or acute bony abnormality.  Small bilateral pleural effusions are again identified.  IMPRESSION: Decreased bilateral interstitial and airspace opacities which may represent improved edema or infection.  Bibasilar opacities again noted, improved on the right.  Small bilateral pleural effusions again identified.   Electronically Signed   By: Margarette Canada M.D.   On: 08/08/2014 17:11   Dg Chest 2 View  07/25/2014   CLINICAL DATA:  Acute shortness of breath with right chest and rib pain for 1 week. lifting injury.  EXAM: CHEST  2 VIEW  COMPARISON:  None.  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 Normal heart size and vascularity. Minor central bronchitic change without focal pneumonia, collapse or consolidation. No edema, effusion or pneumothorax. Trachea midline. No acute osseous finding.  IMPRESSION: Mild central bronchitic change.  Anterior eventration of the right hemidiaphragm  No superimposed acute process.   Electronically Signed   By: Jerilynn Mages.  Shick M.D.   On: 07/25/2014 09:03   Nm Hepatobiliary Including Gb  08/02/2014   CLINICAL DATA:  Status post cholecystectomy 07/30/2014  EXAM: NUCLEAR MEDICINE HEPATOBILIARY IMAGING  TECHNIQUE: Sequential images of the abdomen were obtained out to 60 minutes following intravenous administration of radiopharmaceutical.  RADIOPHARMACEUTICALS:  5.5 Millicurie  [id number] Choletec  COMPARISON:  None.  FINDINGS: Flow study shows uniform distribution of radioactivity in the liver. Sequential imaging of the liver and abdomen over a period of 60 minutes shows uniform uptake of the tracer in the liver.  Prior cholecystectomy. Filling of the common bile duct. Radiotracer uptake is present in the small bowel at 20 minutes. There is abnormal radiotracer collection adjacent to the inferior margin of the left hepatic lobe most concerning for a bile leak.  At the end of 1-hour, there is relatively poor clearance of activity from the liver.  IMPRESSION: 1. Abnormal radiotracer collection adjacent to the inferior margin of the left hepatic lobe most concerning for a bile leak. These results were called by telephone at the time of interpretation on 08/02/2014 at 12:01 pm to Dr. Jacquelynn Cree , who verbally acknowledged these results.   Electronically Signed   By: Kathreen Devoid   On: 08/02/2014 12:18   US Abdomen Complete  07/25/2014   CLINICAL DATA:  Acute right upper quadrant pain for 1 week.  EXAM: ULTRASOUND ABDOMEN COMPLETE  COMPARISON:  None.  FINDINGS: Gallbladder: Intraluminal echogenic mobile abnormalities within the gallbladder without shadowing measure 1 cm in greatest diameter. These likely represent non shadowing stones. Normal wall thickness measuring 2.1 mm. No significant Murphy's sign. Small amount of free fluid along the right liver and gallbladder in the right upper quadrant remains nonspecific.  Common bile duct: Diameter: 2.8 mm  Liver: There are scattered small hypo echoic probable cysts throughout the liver. Left hepatic lobe anterior lateral hypoechoic minimally complex cyst measures 1.4 x 1.1 x 1.1 cm. Patent portal vein with normal hepatopetal flow. No biliary dilatation.  Right hepatic lobe hypoechoic cyst along the lateral inferior margin measures 12 x 13 x  14 mm.  IVC: Not visualized, obscured by bowel gas  Pancreas: Not visualized, obscured by bowel gas   Spleen: Limited visualization. 5.7 cm length. No definite abnormality.  Right Kidney: Length: 10.9 cm. Echogenicity within normal limits. No mass or hydronephrosis visualized.  Left Kidney: Length: 11.9 cm. Echogenicity within normal limits. No mass or hydronephrosis visualized.  Abdominal aorta: No aneurysm visualized.  Other findings: None.  IMPRESSION: Probable non shadowing gallstones. No definite wall thickening or significant Murphy's sign.  Small amount of free fluid along the right liver margin and gallbladder remains nonspecific.  Obscured IVC, pancreas, and spleen by bowel gas.  Scattered hepatic cysts   Electronically Signed   By: Jerilynn Mages.  Shick M.D.   On: 07/25/2014 13:28   Ct Abdomen Pelvis W Contrast  08/11/2014   ADDENDUM REPORT: 08/11/2014 08:32  ADDENDUM: Study discussed by telephone with Dr. Shanon Brow TAT on 08/11/2014 at 0816 hours.   Electronically Signed   By: Genevie Ann M.D.   On: 08/11/2014 08:32   08/11/2014   CLINICAL DATA:  59 year old male with lower GI bleed, lower abdominal pain. Current history of colon cancer resection 07/30/2014. Postoperative bile leak and pneumonia. Initial encounter.  EXAM: CT ABDOMEN AND PELVIS WITH CONTRAST  TECHNIQUE: Multidetector CT imaging of the abdomen and pelvis was performed using the standard protocol following bolus administration of intravenous contrast.  CONTRAST:  152mL OMNIPAQUE IOHEXOL 300 MG/ML  SOLN  COMPARISON:  Preoperative CT Abdomen and Pelvis 07/28/2014  FINDINGS: New moderate right greater than left layering pleural effusions. New bilateral lower lobe consolidation and compressive atelectasis. Patchy opacity in the lingula and right middle lobe are new. No pericardial effusion.  No acute osseous abnormality identified.  Small to moderate volume of pelvic free fluid with simple fluid densitometry. Unremarkable bladder. Mild presacral stranding. Negative distal colon.  Mild stranding in both pericolic gutters. Negative splenic flexure. Residual  transverse colon with retained stool. Small to large bowel anastomosis at the level of the mid transverse with surgical absence of the more proximal colon. Overlying midline ventral abdominal wall incision with skin staples. Right abdominal percutaneous postoperative drain which terminates near the anastomosis. The gallbladder also now is surgically absent. CBD stent in place terminating in the duodenum. Small volume perihepatic free fluid.  Small round hypodense areas in the left and right hepatic lobes are stable. There is a new linear area of hypodensity along the periphery of the right lobe which may be postoperative in nature. Portal venous system is patent. Major arterial structures are patent. Aortoiliac calcified atherosclerosis noted.  The spleen is within normal limits. There is confluent stranding in the lesser sac nearest the pancreas and celiac vasculature. Pancreatic enhancement is preserved. No focal abnormality of the stomach. Stranding continues into the root of the small bowel mesentery. Oral contrast has reached the distal small bowel.  Negative adrenal glands. Renal enhancement and contrast excretion within normal limits. No pneumoperitoneum identified.  IMPRESSION: 1. Inflammatory stranding at the celiac axis and root of the mesentery, favor due to acute pancreatitis. No pancreatic necrosis or organized fluid collection identified. CBD stent in place. 2. Postoperative changes from right hemicolectomy and cholecystectomy for treatment of hepatic flexure poorly differentiated adenocarcinoma. No evidence of bowel obstruction. Postoperative drain in place with small volume perihepatic and small to moderate volume pelvic free fluid which appears simple in nature. 3. Moderate pleural effusions with new lower lobe consolidation and bilateral middle lobe patchy opacity suspicious for pneumonia.  Electronically Signed: By: Genevie Ann  M.D. On: 08/11/2014 08:07   Ct Abdomen Pelvis W Contrast  07/28/2014    ADDENDUM REPORT: 07/28/2014 11:16  ADDENDUM: Study discussed by telephone with Dr. Quintella Reichert on 07/28/2014 at 1105 hours.   Electronically Signed   By: Genevie Ann M.D.   On: 07/28/2014 11:16   07/28/2014   CLINICAL DATA:  59 year old male with 2 weeks of right side abdominal pain. Recently diagnosed with gallstones. Initial encounter.  EXAM: CT ABDOMEN AND PELVIS WITH CONTRAST  TECHNIQUE: Multidetector CT imaging of the abdomen and pelvis was performed using the standard protocol following bolus administration of intravenous contrast.  CONTRAST:  51mL OMNIPAQUE IOHEXOL 300 MG/ML SOLN, 15mL OMNIPAQUE IOHEXOL 300 MG/ML SOLN, 177mL OMNIPAQUE IOHEXOL 300 MG/ML SOLN  COMPARISON:  Abdomen ultrasound 07/25/2014.  FINDINGS: Minor atelectasis at the right lung base. No pericardial or pleural effusion.  No acute osseous abnormality identified.  No pelvic free fluid. Retained stool in the rectum. Negative bladder. Moderately redundant sigmoid colon with retained stool. Negative left colon. Moderately redundant splenic flexure.  Abnormal hepatic flexure with elongated circumferential wall thickening and luminal narrowing, but also extensive surrounding hepatic flexure mesentery nodularity and irregular intermediate density. Discrete rounded adjacent mesenteric nodes measuring up to 9 mm diameter are present.  The abnormal pericolic fat continues to the gallbladder fossa. However, the gallbladder itself does not appear distended or definitely inflamed. There is cholelithiasis evident at the gallbladder fundus.  The upstream ascending colon and cecum than have a more normal appearance. There is a gas-filled laterally situated appendix which tracks cephalad. The terminal ileum is within normal limits. Oral contrast has not yet reached the distal small bowel. No dilated small bowel loops. Negative stomach. The second portion of the duodenum also is in close proximity to the abnormal hepatic flexure.  Trace perihepatic free fluid.  Multiple nonspecific small low-density lesions in liver such as the 14 mm right hepatic lobe entity on series 2, image 27. Also 8 mm lesion in the left lobe on image 27. Larger 14 mm lesion in the lateral left lobe on image 33.  Spleen, pancreas, right adrenal gland, portal venous system, and kidneys are within normal limits.  There is intermediate density nodularity adjacent to the medial lobe of the left adrenal gland which is nonspecific. To a degree this resembles an enlarged retroperitoneal lymph node. There is a similar prominent node situated between the cava and right corona of the diaphragm measuring 6 mm.  No pneumoperitoneum. Aortoiliac calcified atherosclerosis noted. Major arterial structures in the abdomen and pelvis are patent.  IMPRESSION: 1. Markedly abnormal hepatic flexure with wall thickening along a 10-12 cm length but extensive surrounding mesenteric nodularity, and increased regional lymph nodes. The appearance is atypical for acute colitis and highly suspicious for adenocarcinoma. GI consultation for followup colonoscopy recommended. 2. Small volume perihepatic fluid. Multiple up to 14 mm indeterminate low-density lesions in the liver. Several prominent but indeterminate retroperitoneal lymph nodes in the upper abdomen. 3. Abnormal fat stranding/nodularity continues to the gallbladder fossa, but the gallbladder does not appear inflamed.  Electronically Signed: By: Genevie Ann M.D. On: 07/28/2014 11:00   Dg Chest Port 1 View  08/01/2014   CLINICAL DATA:  Postoperative bile leak.  Hypertension.  EXAM: PORTABLE CHEST - 1 VIEW  COMPARISON:  July 30, 2014.  FINDINGS: Stable cardiomediastinal silhouette. Nasogastric tube is seen entering stomach. Right internal jugular catheter is stable in position with tip overlying expected position of the SVC. No pneumothorax is noted. Increased left perihilar  opacity is noted concerning for pneumonia. Significantly increased opacity is noted in the right  middle lobe concerning for pneumonia. Right upper lobe opacity is also noted concerning for pneumonia. No significant pleural effusion is noted. Bony thorax is intact.  IMPRESSION: Increased bilateral upper lobe and right middle lobe opacities are noted most consistent with pneumonia or possibly edema. Followup radiographs are recommended.   Electronically Signed   By: Marijo Conception, M.D.   On: 08/01/2014 10:35   Dg Chest Port 1 View  07/30/2014   CLINICAL DATA:  Evaluate PICC line placement  EXAM: PORTABLE CHEST - 1 VIEW  COMPARISON:  07/25/2014  FINDINGS: Low lung volumes. Mild right basilar atelectasis. No focal consolidation. No pleural effusion or pneumothorax.  The heart is normal in size.  Right IJ venous catheter terminates in the mid SVC.  Enteric tube courses into the proximal stomach.  Cholecystectomy clips.  IMPRESSION: Right IJ venous catheter terminates in the mid SVC. No pneumothorax.   Electronically Signed   By: Julian Hy M.D.   On: 07/30/2014 18:34   Dg Ercp  08/06/2014   CLINICAL DATA:  Bile leak  EXAM: ERCP  TECHNIQUE: Multiple spot images obtained with the fluoroscopic device and submitted for interpretation post-procedure.  COMPARISON:  None.  FINDINGS: Images demonstrate cannulation of the common bile duct and contrast filling the biliary tree. A biliary stent is present across the distal common bile duct.  IMPRESSION: Biliary stent placement.  These images were submitted for radiologic interpretation only. Please see the procedural report for the amount of contrast and the fluoroscopy time utilized.   Electronically Signed   By: Marybelle Killings M.D.   On: 08/06/2014 12:46     Medical Consultants:    Dr. Lucio Edward, GI.  Dr. Jackolyn Confer, Surgery  Anti-Infectives:   Anti-infectives    Start     Dose/Rate Route Frequency Ordered Stop   08/12/14 1200  vancomycin (VANCOCIN) 1,250 mg in sodium chloride 0.9 % 250 mL IVPB     1,250 mg 166.7 mL/hr over 90 Minutes  Intravenous Every 8 hours 08/12/14 1050     08/10/14 1400  vancomycin (VANCOCIN) IVPB 1000 mg/200 mL premix  Status:  Discontinued     1,000 mg 200 mL/hr over 60 Minutes Intravenous Every 8 hours 08/10/14 0608 08/12/14 1050   08/10/14 0615  vancomycin (VANCOCIN) 1,500 mg in sodium chloride 0.9 % 500 mL IVPB     1,500 mg 250 mL/hr over 120 Minutes Intravenous  Once 08/10/14 0607 08/10/14 0853   08/08/14 1500  imipenem-cilastatin (PRIMAXIN) 500 mg in sodium chloride 0.9 % 100 mL IVPB     500 mg 200 mL/hr over 30 Minutes Intravenous Every 6 hours 08/08/14 1348     08/08/14 1400  imipenem-cilastatin (PRIMAXIN) 500 mg in sodium chloride 0.9 % 100 mL IVPB  Status:  Discontinued     500 mg 200 mL/hr over 30 Minutes Intravenous 3 times per day 08/08/14 1341 08/08/14 1348   08/08/14 1400  vancomycin (VANCOCIN) IVPB 750 mg/150 ml premix  Status:  Discontinued     750 mg 150 mL/hr over 60 Minutes Intravenous Every 8 hours 08/08/14 1349 08/10/14 0606   08/01/14 1400  piperacillin-tazobactam (ZOSYN) IVPB 3.375 g  Status:  Discontinued     3.375 g 12.5 mL/hr over 240 Minutes Intravenous Every 8 hours 08/01/14 0811 08/08/14 1341   08/01/14 0900  piperacillin-tazobactam (ZOSYN) IVPB 3.375 g     3.375 g 100 mL/hr over 30 Minutes  Intravenous  Once 08/01/14 0811 08/01/14 0914   07/31/14 0200  cefoTEtan (CEFOTAN) 2 g in dextrose 5 % 50 mL IVPB     2 g 100 mL/hr over 30 Minutes Intravenous Every 12 hours 07/30/14 2109 07/31/14 0326   07/29/14 2200  piperacillin-tazobactam (ZOSYN) IVPB 3.375 g  Status:  Discontinued     3.375 g 12.5 mL/hr over 240 Minutes Intravenous 3 times per day 07/29/14 1328 07/30/14 2109   07/29/14 1500  piperacillin-tazobactam (ZOSYN) IVPB 3.375 g     3.375 g 100 mL/hr over 30 Minutes Intravenous  Once 07/29/14 1328 07/29/14 1549      Subjective:    Nicholos Johns denies any pain. Complains of some discomfort in his right forearm where his IV is infusing. No dyspnea or cough.  Appetite remains poor. Last bowel movement was 08/10/14.  Objective:    Filed Vitals:   08/12/14 0232 08/12/14 0523 08/12/14 1035 08/12/14 1440  BP: 128/65 135/72 122/63 125/64  Pulse: 61 65 66 68  Temp: 98.1 F (36.7 C) 98 F (36.7 C) 98.7 F (37.1 C) 98.6 F (37 C)  TempSrc: Oral Oral Oral Oral  Resp: 20 18 18 18   Height:      Weight:      SpO2: 100% 100% 99% 96%    Intake/Output Summary (Last 24 hours) at 08/12/14 1647 Last data filed at 08/12/14 1500  Gross per 24 hour  Intake 3738.75 ml  Output   1530 ml  Net 2208.75 ml    Exam: Gen:  NAD Cardiovascular:  RRR, No M/R/G Respiratory:  Lungs CTAB Gastrointestinal:  Abdomen soft, NT/ND, + BS Extremities:  No C/E/C, right forearm with slight induration/erythema at IV infusion site   Data Reviewed:    Labs: Basic Metabolic Panel:  Recent Labs Lab 08/08/14 0515 08/09/14 0440 08/10/14 0435 08/11/14 0440 08/12/14 0545  NA 141 135 138 137 141  K 3.8 3.3* 3.4* 3.0* 4.0  CL 110 105 108 102 107  CO2 22 24 26 26 26   GLUCOSE 105* 101* 88 93 76  BUN 10 11 14 11 8   CREATININE 0.44* 0.57 0.49* 0.46* 0.46*  CALCIUM 7.8* 7.2* 7.4* 7.5* 7.7*  MG  --   --  2.1 2.2  --    GFR Estimated Creatinine Clearance: 98.2 mL/min (by C-G formula based on Cr of 0.46). Liver Function Tests:  Recent Labs Lab 08/10/14 0435 08/11/14 0440 08/12/14 0545  AST 19 16 22   ALT 23 15 18   ALKPHOS 139* 124* 148*  BILITOT 0.7 0.1* 0.6  PROT 5.4* 5.2* 5.3*  ALBUMIN 1.7* 1.7* 1.8*    Recent Labs Lab 08/11/14 0440 08/12/14 0545  LIPASE 41 27  AMYLASE 45  --    CBC:  Recent Labs Lab 08/07/14 0408 08/08/14 0515 08/09/14 0440 08/10/14 0435 08/11/14 0440 08/12/14 0545  WBC 8.6 11.5* 10.4 8.1 9.1 10.6*  NEUTROABS 6.7  --   --   --   --   --   HGB 7.5* 7.5* 7.5* 7.7* 7.5* 7.6*  HCT 25.2* 24.7* 24.6* 25.7* 24.9* 25.5*  MCV 81.0 80.7 79.1 79.8 79.3 79.4  PLT 447* 567* 456* 451* 392 473*   Sepsis Labs:  Recent Labs Lab  08/08/14 1430 08/09/14 0440 08/10/14 0435 08/11/14 0440 08/12/14 0545  WBC  --  10.4 8.1 9.1 10.6*  LATICACIDVEN 1.3  --   --   --   --    Microbiology Recent Results (from the past 240 hour(s))  MRSA PCR  Screening     Status: None   Collection Time: 08/03/14 11:19 PM  Result Value Ref Range Status   MRSA by PCR NEGATIVE NEGATIVE Final    Comment:        The GeneXpert MRSA Assay (FDA approved for NASAL specimens only), is one component of a comprehensive MRSA colonization surveillance program. It is not intended to diagnose MRSA infection nor to guide or monitor treatment for MRSA infections.   Clostridium Difficile by PCR     Status: None   Collection Time: 08/08/14  6:06 AM  Result Value Ref Range Status   C difficile by pcr NEGATIVE NEGATIVE Final  Culture, blood (routine x 2)     Status: None (Preliminary result)   Collection Time: 08/08/14  1:47 PM  Result Value Ref Range Status   Specimen Description BLOOD LEFT ASSIST CONTROL  Final   Special Requests BOTTLES DRAWN AEROBIC ONLY 2CC  Final   Culture   Final           BLOOD CULTURE RECEIVED NO GROWTH TO DATE CULTURE WILL BE HELD FOR 5 DAYS BEFORE ISSUING A FINAL NEGATIVE REPORT Performed at [facility]    Report Status PENDING  Incomplete  Culture, blood (routine x 2)     Status: None (Preliminary result)   Collection Time: 08/08/14  2:30 PM  Result Value Ref Range Status   Specimen Description BLOOD LEFT ARM  Final   Special Requests BOTTLES DRAWN AEROBIC ONLY 2CC  Final   Culture   Final           BLOOD CULTURE RECEIVED NO GROWTH TO DATE CULTURE WILL BE HELD FOR 5 DAYS BEFORE ISSUING A FINAL NEGATIVE REPORT Performed at [facility]    Report Status PENDING  Incomplete  Urine culture     Status: None   Collection Time: 08/08/14  5:31 PM  Result Value Ref Range Status   Specimen Description URINE, CLEAN CATCH  Final   Special Requests NONE  Final   Colony Count NO GROWTH Performed at  [facility]   Final   Culture NO GROWTH Performed at [facility]   Final   Report Status 08/09/2014 FINAL  Final     Medications:   . ALPRAZolam  1 mg Oral TID  . atenolol  100 mg Oral Daily  . heparin subcutaneous  5,000 Units Subcutaneous 3 times per day  . imipenem-cilastatin  500 mg Intravenous Q6H  . multivitamin  5 mL Oral QHS  . pantoprazole  40 mg Oral QHS  . vancomycin  1,250 mg Intravenous Q8H   Continuous Infusions: . 0.9 % NaCl with KCl 40 mEq / L 50 mL/hr (08/12/14 1306)    Time spent: 25 minutes.   LOS: 15 days   [facility] Pager [phone]. If unable to reach me by pager, please call my cell phone at [phone].  *Please refer to [email or web address], password [email or web address] to get updated schedule on who will round on this patient, as hospitalists switch teams weekly. If 7PM-7AM, please contact night-coverage at [email or web address], password [email or web address] for any overnight needs.  08/12/2014, 4:47 PM

## 2014-08-12 NOTE — Progress Notes (Signed)
ANTIBIOTIC CONSULT NOTE - FOLLOW UP  Pharmacy Consult for Vancomycin Indication: Sepsis  No Known Allergies  Patient Measurements: Height: 5\' 9"  (175.3 cm) Weight: 163 lb (73.936 kg) IBW/kg (Calculated) : 70.7  Vital Signs: Temp: 98.7 F (37.1 C) (03/10 1035) Temp Source: Oral (03/10 1035) BP: 122/63 mmHg (03/10 1035) Pulse Rate: 66 (03/10 1035) Intake/Output from previous day: 03/09 0701 - 03/10 0700 In: 2212.5 [I.V.:1412.5; IV Piggyback:800] Out: 0932 [Urine:975; Drains:410]  Labs:  Recent Labs  08/10/14 0435 08/11/14 0440 08/12/14 0545  WBC 8.1 9.1 10.6*  HGB 7.7* 7.5* 7.6*  PLT 451* 392 473*  CREATININE 0.49* 0.46* 0.46*   Estimated Creatinine Clearance: 98.2 mL/min (by C-G formula based on Cr of 0.46).  Recent Labs  08/10/14 0435 08/12/14 0545  VANCOTROUGH 7.0* 9.5*     Microbiology: Recent Results (from the past 720 hour(s))  MRSA PCR Screening     Status: None   Collection Time: 08/03/14 11:19 PM  Result Value Ref Range Status   MRSA by PCR NEGATIVE NEGATIVE Final    Comment:        The GeneXpert MRSA Assay (FDA approved for NASAL specimens only), is one component of a comprehensive MRSA colonization surveillance program. It is not intended to diagnose MRSA infection nor to guide or monitor treatment for MRSA infections.   Clostridium Difficile by PCR     Status: None   Collection Time: 08/08/14  6:06 AM  Result Value Ref Range Status   C difficile by pcr NEGATIVE NEGATIVE Final  Culture, blood (routine x 2)     Status: None (Preliminary result)   Collection Time: 08/08/14  1:47 PM  Result Value Ref Range Status   Specimen Description BLOOD LEFT ASSIST CONTROL  Final   Special Requests BOTTLES DRAWN AEROBIC ONLY 2CC  Final   Culture   Final           BLOOD CULTURE RECEIVED NO GROWTH TO DATE CULTURE WILL BE HELD FOR 5 DAYS BEFORE ISSUING A FINAL NEGATIVE REPORT Performed at [facility]    Report Status PENDING  Incomplete   Culture, blood (routine x 2)     Status: None (Preliminary result)   Collection Time: 08/08/14  2:30 PM  Result Value Ref Range Status   Specimen Description BLOOD LEFT ARM  Final   Special Requests BOTTLES DRAWN AEROBIC ONLY 2CC  Final   Culture   Final           BLOOD CULTURE RECEIVED NO GROWTH TO DATE CULTURE WILL BE HELD FOR 5 DAYS BEFORE ISSUING A FINAL NEGATIVE REPORT Performed at [facility]    Report Status PENDING  Incomplete  Urine culture     Status: None   Collection Time: 08/08/14  5:31 PM  Result Value Ref Range Status   Specimen Description URINE, CLEAN CATCH  Final   Special Requests NONE  Final   Colony Count NO GROWTH Performed at [facility]   Final   Culture NO GROWTH Performed at [facility]   Final   Report Status 08/09/2014 FINAL  Final     Assessment: 35 yoF admitted 2/24 for evaluation of persistent R flank pain and anemia. Pt was seen in ED 2/21 for bronchitis and RLQ pain, found to have cholelithiasis without evidence of cholecystitis. Colonoscopy performed 2/25 revealed large circumferential obstructing bleeding friable mass at the hepatic flexure. Pt is s/p exploratory laparotomy, extended right colectomy, biopsy of peritoneal nodules and cholecystectomy on 07/30/14.  2/26 R colectomy 3/4 ERCP with stent for bile leak. 3/8 CT = pancreatitis.   Pt was originally started on Zosyn per Pharmacy on 2/25 for intra-abdominal infection, which was discontinued post-surgery on 2/26. Pt now with new leukocytosis, bilateral opacities on chest XRay and Pharmacy is asked to restart Zosyn for pneumonia vs intra-abdominal infection.  2/27 >> Cefotetan x1 2/26 >> Zosyn >> 2/26 2/28 >> Zosyn restart >> 3/6 3/6 >> Vanc >>  3/6 >> imipenem (MD)>>  Tmax: AF now WBCs: slightly elevated now Renal: SCr low/stable, Norm CrCl ~ 98 ml/min  3/6 Urine: NG 3/6 blood x2: NGTD 3/6 C. Difficile: neg  3/9 0530 VT = 9.5 mcg/ml on Vanc 1g q8h  (prior to 6th dose), increase to 1250mg  q8h   Goal of Therapy:  Vancomycin trough level 15-20 mcg/ml Appropriate abx dosing, eradication of infection.   Plan:   Increase Vancomycin to 1250mg  IV q8h and give next dose 2 hours earlier.   Agree with Primaxin 500mg  IV q6h.   Measure Vanc trough at steady state.  Follow up renal fxn, culture results, and clinical course.  Romeo Rabon, PharmD, pager [phone]. 08/12/2014,10:55 AM.

## 2014-08-12 NOTE — Progress Notes (Signed)
Occupational Therapy Treatment Patient Details Name: Donald Fry MRN: [id number] DOB: June 14, 1955 Today's Date: 08/12/2014    History of present illness 59 yo male s/p exp lap, colectomy, cholecystectomy 07/30/14. Hx of HTN, anemia.    OT comments  Pt was in pain during OT session:  Wanted to stand to urinate and get back into bed.  Ambulated around side of bed with supervision to min guard.  Did not try 3:1 on this visit due to pain  Follow Up Recommendations  Supervision/Assistance - 24 hour    Equipment Recommendations  3 in 1 bedside comode    Recommendations for Other Services      Precautions / Restrictions Precautions Precautions: Fall Precaution Comments: drain Restrictions Weight Bearing Restrictions: No       Mobility Bed Mobility             Sit to sidelying: Min assist General bed mobility comments: light assist for bil LEs   Transfers   Equipment used: Rolling walker (2 wheeled) Transfers: Sit to/from Stand Sit to Stand: Supervision         General transfer comment: cues for UE placement.  Pt left walker at bathroom door and walked over to bed with min guard for safety    Balance                                   ADL Overall ADL's : Needs assistance/impaired                                       General ADL Comments: Pt ambulated around bed with min guard and stood to urinate with min guard.  Pain too high to assess 3:1 commode transfer this session.      Vision                     Perception     Praxis      Cognition   Behavior During Therapy: WFL for tasks assessed/performed Overall Cognitive Status: Within Functional Limits for tasks assessed                       Extremity/Trunk Assessment               Exercises     Shoulder Instructions       General Comments      Pertinent Vitals/ Pain       Pain Assessment: Faces Faces Pain Scale: Hurts whole lot Pain  Location: abdomen Pain Descriptors / Indicators: Aching Pain Intervention(s): Limited activity within patient's tolerance;Monitored during session;Patient requesting pain meds-RN notified;Repositioned  Home Living                                          Prior Functioning/Environment              Frequency Min 2X/week     Progress Toward Goals  OT Goals(current goals can now be found in the care plan section)  Progress towards OT goals: Progressing toward goals     Plan Discharge plan remains appropriate    Co-evaluation                 End of Session  Activity Tolerance Patient limited by pain   Patient Left in bed;with call bell/phone within reach   Nurse Communication Patient requests pain meds        Time: 1694-5038 OT Time Calculation (min): 11 min  Charges: OT General Charges $OT Visit: 1 Procedure OT Treatments $Self Care/Home Management : 8-22 mins  Dijuan Sleeth 08/12/2014, 11:49 AM  Lesle Chris, OTR/L [phone] 08/12/2014

## 2014-08-12 NOTE — Progress Notes (Signed)
Family called and shows concern about patient's lack of appetite and his lack of desire to walk. Familty wants MD to withhold pain meds until he walks and eats more. Will pass on to day shift to report to MD.

## 2014-08-13 DIAGNOSIS — R630 Anorexia: Secondary | ICD-10-CM

## 2014-08-13 DIAGNOSIS — F329 Major depressive disorder, single episode, unspecified: Secondary | ICD-10-CM

## 2014-08-13 LAB — CBC
HCT: 26.5 % — ABNORMAL LOW (ref 39.0–52.0)
Hemoglobin: 7.8 g/dL — ABNORMAL LOW (ref 13.0–17.0)
MCH: 23.5 pg — AB (ref 26.0–34.0)
MCHC: 29.4 g/dL — ABNORMAL LOW (ref 30.0–36.0)
MCV: 79.8 fL (ref 78.0–100.0)
PLATELETS: 579 10*3/uL — AB (ref 150–400)
RBC: 3.32 MIL/uL — ABNORMAL LOW (ref 4.22–5.81)
RDW: 19.8 % — ABNORMAL HIGH (ref 11.5–15.5)
WBC: 15.1 10*3/uL — AB (ref 4.0–10.5)

## 2014-08-13 MED ORDER — ENSURE COMPLETE PO LIQD
237.0000 mL | Freq: Three times a day (TID) | ORAL | Status: DC
  Administered 2014-08-13: 237 mL via ORAL

## 2014-08-13 MED ORDER — MIRTAZAPINE 15 MG PO TABS
7.5000 mg | ORAL_TABLET | Freq: Every day | ORAL | Status: DC
  Administered 2014-08-13 – 2014-08-15 (×3): 7.5 mg via ORAL
  Filled 2014-08-13 (×3): qty 1

## 2014-08-13 MED ORDER — BOOST / RESOURCE BREEZE PO LIQD
1.0000 | Freq: Three times a day (TID) | ORAL | Status: DC
  Administered 2014-08-13 – 2014-08-14 (×3): 1 via ORAL

## 2014-08-13 NOTE — Progress Notes (Addendum)
Progress Note   Donald Fry KGM:[id number] DOB: [birth year] DOA: 07/28/2014 PCP: [name], MD   Brief Narrative:   Donald Fry is an 59 y.o. male with a PMH of hyperlipidemia, anemia, hypertension and tobacco abuse who was admitted 07/28/14 with a chief complaint of a 2 day history of right upper quadrant abdominal pain. Patient also reported having ongoing dyspnea on exertion for several months prior to the admission and unintentional weight loss. Evaluation on the admission included CT abdomen which showed a possible colon cancer near the hepatic flexure with surrounding lymphadenopathy. Patient underwent colonoscopy 07/29/2014 followed by surgical resection of the colon cancer 07/30/2014. Postoperative course has been complicated with bile leak and healthcare associated pneumonia. Additionally, JP on 08/05/14 with bile color and has increased in amount drainge so ERCP done on 08/06/14 with stent in CBD. Patient then developed fever of 102.11F on 08/08/2014. The patient was started on empiric antibiotics and cultures were performed. He continued to have low-grade fevers. As result, repeat CT of the abdomen and pelvis was performed on 08/11/14. In addition, the patient has increased drainage from Chambers again on 08/10/14.  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  - Status post exploratory laparotomy, extended right colectomy, biopsy of peritoneal nodules and cholecystectomy on 07/30/14. 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 - During this hospitalization, patient has received 4 units of PRBC blood transfusion. He reports no bleeding since. - Hemoglobin 7.5 on 08/09/2014.  -Hgb has largely remained stable; Baseline Hgb 7-8. -Patient will follow-up with oncology per scheduled  appointment on discharge. -08/10/14--appreciate Dr. Benay Spice consult-->PET after d/c, place port-a-cath, start iron.  Active Problems: Depression - Admits to depressed mood.  Wife reports irritability, change in personality. - Remeron started.  Malnutrition / Poor appetite - Dietician consult for assessment of nutritional status. - Remeron for appetite stimulation. - Continue Ensure TID.  New Fever-->HAP/HCAP vs neoplastic fever/Acute respiratory failure -Previously finished zosyn on 08/07/14 for HCAP but spiked fever of 102.11F@1320  on 08/08/14, thought to be from HAP. -May need thoracocentesis r/o empyema if fever returns vs malignant effusion. -Blood cultures x 2 sets--neg; UA--no pyuria; 08/08/14--Cdiff-- NEG.  No elevation of lactic acid. -Imipenem and vancomycin started 08/08/14.   -pt continued to have fever 100.11F--in the setting of slight increase in abdominal pain and increased JP drainage, repeat CT abdomen and pelvis 3/9 showed Inflammatory stranding at the celiac axis and root of the mesentery, favor due to acute pancreatitis; Postoperative changes from right hemicolectomy and cholecystectomy; bilateral pleural effusions with new bilateral lower lobe consolidations. -Patient not felt to have pancreatitis clinically (or biochemically--lipase 41). -GI reconsulted, no new recommendations.  Postoperative bile leak - HIDA done 08/02/14 showed bile leak.  - 08/06/14 ERCP (Dr. Boykin Peek leak from biliary system-->stent placed. - Seems to be resolving.  Essential hypertension  - BP at goal, continue atenolol.  Hyperlipidemia  - Statin on hold until PO intake improves.  Anxiety  - Continue Xanax.    Tobacco abuse/COPD -Continue nicotine patch.  Hypokalemia  - Secondary to GI losses. Supplemented.  Hx of ETOH use quit 4 years ago - Not active problem at present.  DVT Prophylaxis  - Heparin subcutaneous ordered.  Code Status: Full. Family Communication: Wife updated at  the bedside. Disposition Plan: Home when stable.  IV Access:    Peripheral IV   Procedures and diagnostic studies:   Ct Abdomen Pelvis W Contrast 07/28/14: 1. Markedly abnormal hepatic flexure with wall thickening along a 10-12 cm length but extensive surrounding mesenteric nodularity, and increased regional lymph nodes. The appearance is atypical for acute colitis and highly suspicious for adenocarcinoma. GI consultation for followup colonoscopy recommended. 2. Small volume perihepatic fluid. Multiple up to 14 mm indeterminate low-density lesions in the liver. Several prominent but indeterminate retroperitoneal lymph nodes in the upper abdomen. 3. Abnormal fat stranding/nodularity continues to the gallbladder fossa, but the gallbladder does not appear inflamed.   Colonoscopy 07/29/14: 1. Large circumferential obstructing friable mass at the hepaticflexure; multiple biopsies performed2. Mild diverticulosis was noted in the sigmoid colon anddescending colon.  Laparoscopy converted to exploratory laparotomy, extended right colectomy, biopsy of peritoneal nodules on diaphragm with closure of diaphragm, cholecystectomy 07/30/14: Done by Dr. Zella Richer.  Dg Chest Port 1 View 07/30/2014: Right IJ venous catheter terminates in the mid SVC. No pneumothorax.   Dg Chest Port 1 View 08/01/2014: Increased bilateral upper lobe and right middle lobe opacities are noted most consistent with pneumonia or possibly edema. Followup radiographs are recommended.   Nm Hepatobiliary Including Gb 08/02/2014: 1. Abnormal radiotracer collection adjacent to the inferior margin of the left hepatic lobe most concerning for a bile leak.   Dg Ercp 08/06/2014: Biliary stent placement.  These images were submitted for radiologic interpretation only. Please see the procedural report for the amount of contrast and the fluoroscopy time utilized.     Dg Chest 2 View 08/08/2014: Decreased bilateral interstitial and airspace  opacities which may represent improved edema or infection.  Bibasilar opacities again noted, improved on the right.  Small bilateral pleural effusions again identified.     Ct Abdomen Pelvis W Contrast 08/11/2014: 1. Inflammatory stranding at the celiac axis and root of the mesentery, favor due to acute pancreatitis. No pancreatic necrosis or organized fluid collection identified. CBD stent in place. 2. Postoperative changes from right hemicolectomy and cholecystectomy for treatment of hepatic flexure poorly differentiated adenocarcinoma. No evidence of bowel obstruction. Postoperative drain in place with small volume perihepatic and small to moderate volume pelvic free fluid which appears simple in nature. 3. Moderate pleural effusions with new lower lobe consolidation and bilateral middle lobe patchy opacity suspicious for pneumonia.     Medical Consultants:    Dr. Lucio Edward, GI.  Dr. Jackolyn Confer, Surgery  Anti-Infectives:    Zosyn 07/29/14---> 07/30/14, resumed 08/01/14--->08/08/14  Imipenem 08/08/14--->  Vancomycin 08/10/14--->  Subjective:   Donald Fry denies any pain. Appetite remains poor. Last bowel movement was 08/10/14.  Ambulating.  Wants to go home.  Objective:    Filed Vitals:   08/12/14 1035 08/12/14 1440 08/12/14 2132 08/13/14 0517  BP: 122/63 125/64 135/63 118/60  Pulse: 66 68 67 70  Temp: 98.7 F (37.1 C) 98.6 F (37 C) 98.8 F (37.1 C) 98.4 F (36.9 C)  TempSrc: Oral Oral Oral Oral  Resp: 18 18 18 16   Height:      Weight:      SpO2: 99% 96% 100% 96%    Intake/Output Summary (Last 24 hours) at 08/13/14 4650 Last data filed at 08/13/14 0701  Gross per 24 hour  Intake 3132.5 ml  Output   1810 ml  Net 1322.5 ml    Exam: Gen:  NAD Psych: Depressed affect Cardiovascular:  RRR, No M/R/G Respiratory:  Lungs CTAB Gastrointestinal:  Abdomen soft, NT/ND, + BS Extremities:  No C/E/C   Data Reviewed:    Labs: Basic Metabolic Panel:  Recent  Labs Lab 08/08/14 0515 08/09/14 0440 08/10/14 0435 08/11/14 0440 08/12/14 0545  NA 141 135 138 137 141  K 3.8 3.3* 3.4* 3.0* 4.0  CL 110 105 108 102 107  CO2 22 24 26 26 26   GLUCOSE 105* 101* 88 93 76  BUN 10 11 14 11 8   CREATININE 0.44* 0.57 0.49* 0.46* 0.46*  CALCIUM 7.8* 7.2* 7.4* 7.5* 7.7*  MG  --   --  2.1 2.2  --    GFR Estimated Creatinine Clearance: 98.2 mL/min (by C-G formula based on Cr of 0.46). Liver Function Tests:  Recent Labs Lab 08/10/14 0435 08/11/14 0440 08/12/14 0545  AST 19 16 22   ALT 23 15 18   ALKPHOS 139* 124* 148*  BILITOT 0.7 0.1* 0.6  PROT 5.4* 5.2* 5.3*  ALBUMIN 1.7* 1.7* 1.8*    Recent Labs Lab 08/11/14 0440 08/12/14 0545  LIPASE 41 27  AMYLASE 45  --    CBC:  Recent Labs Lab 08/07/14 0408 08/08/14 0515 08/09/14 0440 08/10/14 0435 08/11/14 0440 08/12/14 0545  WBC 8.6 11.5* 10.4 8.1 9.1 10.6*  NEUTROABS 6.7  --   --   --   --   --   HGB 7.5* 7.5* 7.5* 7.7* 7.5* 7.6*  HCT 25.2* 24.7* 24.6* 25.7* 24.9* 25.5*  MCV 81.0 80.7 79.1 79.8 79.3 79.4  PLT 447* 567* 456* 451* 392 473*   Sepsis Labs:  Recent Labs Lab 08/08/14 1430 08/09/14 0440 08/10/14 0435 08/11/14 0440 08/12/14 0545  WBC  --  10.4 8.1 9.1 10.6*  LATICACIDVEN 1.3  --   --   --   --    Microbiology Recent Results (from the past 240 hour(s))  MRSA PCR Screening     Status: None   Collection Time: 08/03/14 11:19 PM  Result Value Ref Range Status   MRSA by PCR NEGATIVE NEGATIVE Final    Comment:        The GeneXpert MRSA Assay (FDA approved for NASAL specimens only), is one component of a comprehensive MRSA colonization surveillance program. It is not intended to diagnose MRSA infection nor to guide or monitor treatment for MRSA infections.   Clostridium Difficile by PCR     Status: None   Collection Time: 08/08/14  6:06 AM  Result Value Ref Range Status   C difficile by pcr NEGATIVE NEGATIVE Final  Culture, blood (routine x 2)     Status: None  (Preliminary result)   Collection Time: 08/08/14  1:47 PM  Result Value Ref Range Status   Specimen Description BLOOD LEFT ASSIST CONTROL  Final   Special Requests BOTTLES DRAWN AEROBIC ONLY 2CC  Final   Culture   Final           BLOOD CULTURE RECEIVED NO GROWTH TO DATE CULTURE WILL BE HELD FOR 5 DAYS BEFORE ISSUING A FINAL NEGATIVE REPORT Performed at [facility]    Report Status PENDING  Incomplete  Culture, blood (routine x 2)     Status: None (Preliminary result)   Collection Time: 08/08/14  2:30 PM  Result Value Ref Range Status   Specimen Description BLOOD LEFT ARM  Final   Special Requests BOTTLES DRAWN AEROBIC ONLY 2CC  Final   Culture   Final           BLOOD CULTURE RECEIVED NO GROWTH TO DATE CULTURE WILL BE HELD FOR 5 DAYS BEFORE ISSUING A FINAL  NEGATIVE REPORT Performed at [facility]    Report Status PENDING  Incomplete  Urine culture     Status: None   Collection Time: 08/08/14  5:31 PM  Result Value Ref Range Status   Specimen Description URINE, CLEAN CATCH  Final   Special Requests NONE  Final   Colony Count NO GROWTH Performed at [facility]   Final   Culture NO GROWTH Performed at [facility]   Final   Report Status 08/09/2014 FINAL  Final     Medications:   . ALPRAZolam  1 mg Oral TID  . atenolol  100 mg Oral Daily  . heparin subcutaneous  5,000 Units Subcutaneous 3 times per day  . imipenem-cilastatin  500 mg Intravenous Q6H  . multivitamin  5 mL Oral QHS  . pantoprazole  40 mg Oral QHS  . vancomycin  1,250 mg Intravenous Q8H   Continuous Infusions: . 0.9 % NaCl with KCl 40 mEq / L 50 mL/hr (08/12/14 1306)    Time spent: 25 minutes.   LOS: 16 days   [facility] Pager [phone]. If unable to reach me by pager, please call my cell phone at [phone].  *Please refer to [email or web address], password [email or web address] to get updated schedule on who will round on this patient, as hospitalists switch teams  weekly. If 7PM-7AM, please contact night-coverage at [email or web address], password [email or web address] for any overnight needs.  08/13/2014, 7:28 AM

## 2014-08-13 NOTE — Progress Notes (Deleted)
Lab unable to draw most of blood due to hard stick. Blaine Hamper says no to PICC until nephrology has seen patient. Recommends a central line.

## 2014-08-13 NOTE — Progress Notes (Signed)
INITIAL NUTRITION ASSESSMENT  DOCUMENTATION CODES Per approved criteria  -Not Applicable   INTERVENTION: D/C Ensure  Provide Resource Breeze TID, each provides 250 kcal, 9 grams of protein, 197 ml of free water  Encourage PO intake  NUTRITION DIAGNOSIS: Increased nutrient needs related to chronic illness as evidenced by estimated energy needs.   Goal: Pt to meet >/= 90% of estimated energy needs  Monitor:  PO intake, weight trends, labs, I/O's  Reason for Assessment: C/s for assessment of nutrition requirements/status  59 y.o. male  Admitting Dx: Cancer of transverse colon  ASSESSMENT: 59 y/o male with PMH of HTN, hyperlipidemia and tobacco abuse. He was admitted 2/24 with a chief complaint of a 2 day history of right upper quadrant abdominal pain. Pt is s/p exp lap, colectomy, cholecystectomy 2/26.   Labs- low calcium Currently receiving MVI Wife at bedside upon assessment. Per wife, pt has not lost any wt; however, he has not been eating well since admission. Pt has no interest in food, and did not tolerate Ensure. His current PO intake is 25%. Talked with wife about [employer], and continuing to encourage PO intake and staying hydrated.  NFPE did not reveal subcutaneous body fat or muscle mass depletion; however, based on cancer, he is at risk for malnutrition if his PO intake does not improve. Continue encouraging PO intake.  Height: Ht Readings from Last 1 Encounters:  08/06/14 5\' 9"  (1.753 m)    Weight: Wt Readings from Last 1 Encounters:  08/06/14 163 lb (73.936 kg)    Ideal Body Weight: 160 lb (72.7 kg)  % Ideal Body Weight: 102%  Wt Readings from Last 10 Encounters:  08/06/14 163 lb (73.936 kg)  07/25/14 165 lb (74.844 kg)    Usual Body Weight: unknown  % Usual Body Weight: -  BMI:  Body mass index is 24.06 kg/(m^2).  Estimated Nutritional Needs: [id number]:[id number] Protein: 100-115 grams Fluid: >/=1.7L daily  Skin: intact  Diet Order:  Diet regular  EDUCATION NEEDS: -No education needs identified at this time   Intake/Output Summary (Last 24 hours) at 08/13/14 1242 Last data filed at 08/13/14 0701  Gross per 24 hour  Intake 3132.5 ml  Output   1610 ml  Net 1522.5 ml    Last BM: 3/7  Labs:   Recent Labs Lab 08/10/14 0435 08/11/14 0440 08/12/14 0545  NA 138 137 141  K 3.4* 3.0* 4.0  CL 108 102 107  CO2 26 26 26   BUN 14 11 8   CREATININE 0.49* 0.46* 0.46*  CALCIUM 7.4* 7.5* 7.7*  MG 2.1 2.2  --   GLUCOSE 88 93 76    CBG (last 3)  No results for input(s): GLUCAP in the last 72 hours.  Scheduled Meds: . ALPRAZolam  1 mg Oral TID  . atenolol  100 mg Oral Daily  . feeding supplement (ENSURE COMPLETE)  237 mL Oral TID BM  . heparin subcutaneous  5,000 Units Subcutaneous 3 times per day  . imipenem-cilastatin  500 mg Intravenous Q6H  . mirtazapine  7.5 mg Oral QHS  . multivitamin  5 mL Oral QHS  . pantoprazole  40 mg Oral QHS  . vancomycin  1,250 mg Intravenous Q8H    Continuous Infusions: . 0.9 % NaCl with KCl 40 mEq / L 50 mL/hr (08/12/14 1306)    Past Medical History  Diagnosis Date  . Hypertension   . High cholesterol   . Anxiety   . Colon cancer 07/30/2014    Past Surgical  History  Procedure Laterality Date  . Colonoscopy N/A 07/29/2014    Procedure: COLONOSCOPY;  Surgeon: Ladene Artist, MD;  Location: [facility];  Service: Endoscopy;  Laterality: N/A;  . Laparoscopic partial colectomy N/A 07/30/2014    Procedure: LAPAROSCOPY CONVERTED TO EXPLORATORY LAPAROTOMY EXTENDED RIGHT COLECTOMY BIOPSY OF DIAPHRAGMATIC IMPLANT CLOSURE OF DIAPHRAGM CHOLECYSTECTOMY;  Surgeon: Jackolyn Confer, MD;  Location: [facility];  Service: General;  Laterality: N/A;  . Cholecystectomy N/A 07/30/2014    Procedure: LAPAROSCOPIC CHOLECYSTECTOMY WITH INTRAOPERATIVE CHOLANGIOGRAM;  Surgeon: Jackolyn Confer, MD;  Location: [facility];  Service: General;  Laterality: N/A;  . Ercp N/A 08/06/2014    Procedure: ENDOSCOPIC  RETROGRADE CHOLANGIOPANCREATOGRAPHY (ERCP);  Surgeon: Inda Castle, MD;  Location: [facility];  Service: Endoscopy;  Laterality: N/A;    Wynona Dove, MS Dietetic Intern Pager: [phone]

## 2014-08-13 NOTE — Progress Notes (Signed)
Patient ID: Donald Fry, male   DOB: [birth year], 59 y.o.   MRN: [id number] 7 Days Post-Op  Subjective: Pt feels well except no appetite.  He is walking better.  No new abdominal pain  Objective: Vital signs in last 24 hours: Temp:  [98.4 F (36.9 C)-98.8 F (37.1 C)] 98.4 F (36.9 C) (03/11 0517) Pulse Rate:  [66-70] 70 (03/11 0517) Resp:  [16-18] 16 (03/11 0517) BP: (118-135)/(60-64) 118/60 mmHg (03/11 0517) SpO2:  [96 %-100 %] 96 % (03/11 0517) Last BM Date: 08/09/14  Intake/Output from previous day: 03/10 0701 - 03/11 0700 In: 3132.5 [I.V.:2332.5; IV Piggyback:800] Out: 1324 [Urine:1550; Drains:10] Intake/Output this shift: Total I/O In: -  Out: 250 [Urine:250]  PE: Abd: soft, appropriately tender, +BS, ND, incision c/d/i with staples, JP drain now with much less output that is clear yellow serous fluid. (20cc/24hr)  Lab Results:   Recent Labs  08/12/14 0545 08/13/14 0523  WBC 10.6* 15.1*  HGB 7.6* 7.8*  HCT 25.5* 26.5*  PLT 473* 579*   BMET  Recent Labs  08/11/14 0440 08/12/14 0545  NA 137 141  K 3.0* 4.0  CL 102 107  CO2 26 26  GLUCOSE 93 76  BUN 11 8  CREATININE 0.46* 0.46*  CALCIUM 7.5* 7.7*   PT/INR No results for input(s): LABPROT, INR in the last 72 hours. CMP     Component Value Date/Time   NA 141 08/12/2014 0545   K 4.0 08/12/2014 0545   CL 107 08/12/2014 0545   CO2 26 08/12/2014 0545   GLUCOSE 76 08/12/2014 0545   BUN 8 08/12/2014 0545   CREATININE 0.46* 08/12/2014 0545   CALCIUM 7.7* 08/12/2014 0545   PROT 5.3* 08/12/2014 0545   ALBUMIN 1.8* 08/12/2014 0545   AST 22 08/12/2014 0545   ALT 18 08/12/2014 0545   ALKPHOS 148* 08/12/2014 0545   BILITOT 0.6 08/12/2014 0545   GFRNONAA >90 08/12/2014 0545   GFRAA >90 08/12/2014 0545   Lipase     Component Value Date/Time   LIPASE 27 08/12/2014 0545       Studies/Results: No results found.  Anti-infectives: Anti-infectives    Start     Dose/Rate Route Frequency Ordered  Stop   08/12/14 1200  vancomycin (VANCOCIN) 1,250 mg in sodium chloride 0.9 % 250 mL IVPB     1,250 mg 166.7 mL/hr over 90 Minutes Intravenous Every 8 hours 08/12/14 1050     08/10/14 1400  vancomycin (VANCOCIN) IVPB 1000 mg/200 mL premix  Status:  Discontinued     1,000 mg 200 mL/hr over 60 Minutes Intravenous Every 8 hours 08/10/14 0608 08/12/14 1050   08/10/14 0615  vancomycin (VANCOCIN) 1,500 mg in sodium chloride 0.9 % 500 mL IVPB     1,500 mg 250 mL/hr over 120 Minutes Intravenous  Once 08/10/14 0607 08/10/14 0853   08/08/14 1500  imipenem-cilastatin (PRIMAXIN) 500 mg in sodium chloride 0.9 % 100 mL IVPB     500 mg 200 mL/hr over 30 Minutes Intravenous Every 6 hours 08/08/14 1348     08/08/14 1400  imipenem-cilastatin (PRIMAXIN) 500 mg in sodium chloride 0.9 % 100 mL IVPB  Status:  Discontinued     500 mg 200 mL/hr over 30 Minutes Intravenous 3 times per day 08/08/14 1341 08/08/14 1348   08/08/14 1400  vancomycin (VANCOCIN) IVPB 750 mg/150 ml premix  Status:  Discontinued     750 mg 150 mL/hr over 60 Minutes Intravenous Every 8 hours 08/08/14 1349 08/10/14 0606  08/01/14 1400  piperacillin-tazobactam (ZOSYN) IVPB 3.375 g  Status:  Discontinued     3.375 g 12.5 mL/hr over 240 Minutes Intravenous Every 8 hours 08/01/14 0811 08/08/14 1341   08/01/14 0900  piperacillin-tazobactam (ZOSYN) IVPB 3.375 g     3.375 g 100 mL/hr over 30 Minutes Intravenous  Once 08/01/14 0811 08/01/14 0914   07/31/14 0200  cefoTEtan (CEFOTAN) 2 g in dextrose 5 % 50 mL IVPB     2 g 100 mL/hr over 30 Minutes Intravenous Every 12 hours 07/30/14 2109 07/31/14 0326   07/29/14 2200  piperacillin-tazobactam (ZOSYN) IVPB 3.375 g  Status:  Discontinued     3.375 g 12.5 mL/hr over 240 Minutes Intravenous 3 times per day 07/29/14 1328 07/30/14 2109   07/29/14 1500  piperacillin-tazobactam (ZOSYN) IVPB 3.375 g     3.375 g 100 mL/hr over 30 Minutes Intravenous  Once 07/29/14 1328 07/29/14 1549        Assessment/Plan   1. Right colon tumor, peritoneal implants and right upper quadrant, right lateral abdominal wall, and on gallbladder, cholelithiasis POD 14, s/p Laparoscopy converted to exploratory laparotomy, extended right colectomy, biopsy of peritoneal nodules on diaphragm with closure of diaphragm, cholecystectomy,07/30/12, Dr. Zella Richer. 1B. Bile leak, no obvious leak seen on ERCP 08/06/14, with stent placement by Dr. Deatra Ina. -on regular diet, but has no appetite.  -will add ensure TID today -dc staples today -Dc JP drain Anemia -stable Leukocytosis -increased to 15K today DVT prophylaxis -SCD/heparin ID -Vancomycin and cefotetan, D 6 for PNA  Patient is surgically stable.  He has minimal appetite.  Unfortunately, this is very common in people after surgery and especially in people with metastatic cancer.  Add Ensure to help with caloric intake.  May have food from home.  Encourage diet, but this is likely not going to be resolved prior to discharge.  LOS: 16 days    Michaele Amundson E 08/13/2014, 9:45 AM Pager: [phone]

## 2014-08-13 NOTE — Progress Notes (Signed)
Physical Therapy Treatment Patient Details Name: Donald Fry MRN: [id number] DOB: 1956/05/28 Today's Date: 08/13/2014    History of Present Illness 59 yo male s/p exp lap, colectomy, cholecystectomy 07/30/14. Hx of HTN, anemia.     PT Comments    Pt OOB in recliner on 2 lts O2 at 100%.  Removed oxygen and amb in hallway on RA lowest sat 90%.  Mild dyspnea.  Tolerated well.   Follow Up Recommendations  Home health PT     Equipment Recommendations  Rolling walker with 5" wheels    Recommendations for Other Services       Precautions / Restrictions Precautions Precautions: Fall Precaution Comments: drain Restrictions Weight Bearing Restrictions: No    Mobility  Bed Mobility               General bed mobility comments: Pt OOB in recliner  Transfers Overall transfer level: Needs assistance Equipment used: Rolling walker (2 wheeled) Transfers: Sit to/from Stand Sit to Stand: Supervision         General transfer comment: good safety cognition and use of hands  Ambulation/Gait Ambulation/Gait assistance: Supervision Ambulation Distance (Feet): 450 Feet Assistive device: Rolling walker (2 wheeled) Gait Pattern/deviations: Step-through pattern Gait velocity: WFL   General Gait Details: amb on RA lowest sats 90%.  Mild dyspnea 1/4.     Stairs            Wheelchair Mobility    Modified Rankin (Stroke Patients Only)       Balance                                    Cognition Arousal/Alertness: Awake/alert Behavior During Therapy: WFL for tasks assessed/performed Overall Cognitive Status: Within Functional Limits for tasks assessed                      Exercises      General Comments        Pertinent Vitals/Pain Faces Pain Scale: Hurts a little bit Pain Location: ABD Pain Descriptors / Indicators: Sore Pain Intervention(s): Repositioned    Home Living                      Prior Function             PT Goals (current goals can now be found in the care plan section) Progress towards PT goals: Progressing toward goals    Frequency  Min 3X/week    PT Plan      Co-evaluation             End of Session Equipment Utilized During Treatment: Gait belt Activity Tolerance: Patient tolerated treatment well Patient left: in chair;with call bell/phone within reach;with family/visitor present     Time: 9211-9417 PT Time Calculation (min) (ACUTE ONLY): 25 min  Charges:  $Gait Training: 8-22 mins $Therapeutic Activity: 8-22 mins                    G Codes:      Rica Koyanagi  PTA [facility]  Acute  Rehab Pager      [phone]

## 2014-08-14 LAB — CULTURE, BLOOD (ROUTINE X 2)
Culture: NO GROWTH
Culture: NO GROWTH

## 2014-08-14 MED ORDER — ENSURE PUDDING PO PUDG
1.0000 | Freq: Three times a day (TID) | ORAL | Status: DC
  Administered 2014-08-14 (×2): 1 via ORAL
  Filled 2014-08-14 (×9): qty 1

## 2014-08-14 MED ORDER — PRAVASTATIN SODIUM 40 MG PO TABS
40.0000 mg | ORAL_TABLET | Freq: Every day | ORAL | Status: DC
  Administered 2014-08-14 – 2014-08-15 (×2): 40 mg via ORAL
  Filled 2014-08-14 (×2): qty 1

## 2014-08-14 MED ORDER — ADULT MULTIVITAMIN W/MINERALS CH
1.0000 | ORAL_TABLET | Freq: Every day | ORAL | Status: DC
  Administered 2014-08-14 – 2014-08-16 (×3): 1 via ORAL
  Filled 2014-08-14 (×3): qty 1

## 2014-08-14 MED ORDER — POLYSACCHARIDE IRON COMPLEX 150 MG PO CAPS
150.0000 mg | ORAL_CAPSULE | Freq: Every day | ORAL | Status: DC
  Administered 2014-08-14 – 2014-08-16 (×3): 150 mg via ORAL
  Filled 2014-08-14 (×3): qty 1

## 2014-08-14 NOTE — Progress Notes (Signed)
Progress Note   Donald Fry XLK:[id number] DOB: Mar 14, 1956 DOA: 07/28/2014 PCP: [name], MD   Brief Narrative:   Donald Fry is an 59 y.o. male with a PMH of hyperlipidemia, anemia, hypertension and tobacco abuse who was admitted 07/28/14 with a chief complaint of a 2 day history of right upper quadrant abdominal pain. Patient also reported having ongoing dyspnea on exertion for several months prior to the admission and unintentional weight loss. Evaluation on the admission included CT abdomen which showed a possible colon cancer near the hepatic flexure with surrounding lymphadenopathy. Patient underwent colonoscopy 07/29/2014 followed by surgical resection of the colon cancer 07/30/2014. Postoperative course has been complicated with bile leak and healthcare associated pneumonia. Additionally, JP on 08/05/14 with bile color and has increased in amount drainge so ERCP done on 08/06/14 with stent in CBD. Patient then developed fever of 102.28F on 08/08/2014. The patient was started on empiric antibiotics and cultures were performed. He continued to have low-grade fevers. As result, repeat CT of the abdomen and pelvis was performed on 08/11/14. In addition, the patient has increased drainage from [place] again on 08/10/14.  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  - Status post exploratory laparotomy, extended right colectomy, biopsy of peritoneal nodules and cholecystectomy on 07/30/14. 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 - During this hospitalization, patient has received 4 units of PRBC blood transfusion. He reports no bleeding since. - Hemoglobin 7.5 on 08/09/2014.  -Hgb has largely remained stable; Baseline Hgb 7-8. -Patient will follow-up with oncology per scheduled  appointment on discharge. -08/10/14--appreciate Dr. Benay Spice consult-->PET after d/c, place port-a-cath (order placed for IR consult), start oral iron per recommendations.  Active Problems: Depression - Admits to depressed mood.  Wife reports irritability, change in personality. - Remeron started 08/13/14.  Malnutrition / Poor appetite - Dietician consult for assessment of nutritional status. - Remeron for appetite stimulation. - Continue Ensure TID.  New Fever-->HAP/HCAP vs neoplastic fever/Acute respiratory failure -Previously finished zosyn on 08/07/14 for HCAP but spiked fever of 102.28F@1320  on 08/08/14, thought to be from HAP. -May need thoracocentesis r/o empyema if fever returns vs malignant effusion. -Blood cultures x 2 sets--neg; UA--no pyuria; 08/08/14--Cdiff-- NEG.  No elevation of lactic acid. -Imipenem and vancomycin started 08/08/14, discontinue after today's doses.   -pt continued to have fever 100.28F--in the setting of slight increase in abdominal pain and increased JP drainage, repeat CT abdomen and pelvis 3/9 showed Inflammatory stranding at the celiac axis and root of the mesentery, favor due to acute pancreatitis; Postoperative changes from right hemicolectomy and cholecystectomy; bilateral pleural effusions with new bilateral lower lobe consolidations. -Patient not felt to have pancreatitis clinically (or biochemically--lipase 41). -GI reconsulted, no new recommendations.  Postoperative bile leak - HIDA done 08/02/14 showed bile leak.  - 08/06/14 ERCP (Dr. Boykin Peek leak from biliary system-->stent placed. - Seems to be resolving.  Essential hypertension  - BP at goal, continue atenolol.  Hyperlipidemia  - Resume statin.  Anxiety  - Continue Xanax.    Tobacco abuse/COPD -Continue nicotine patch.  Hypokalemia  - Secondary to GI losses. Supplemented.  Hx of ETOH use quit 4 years ago - Not active problem at present.  DVT Prophylaxis  - Heparin subcutaneous  ordered.  Code Status: Full. Family Communication: Wife updated at  the bedside. Disposition Plan: Home when stable.   IV Access:    Peripheral IV   Procedures and diagnostic studies:   Ct Abdomen Pelvis W Contrast 07/28/14: 1. Markedly abnormal hepatic flexure with wall thickening along a 10-12 cm length but extensive surrounding mesenteric nodularity, and increased regional lymph nodes. The appearance is atypical for acute colitis and highly suspicious for adenocarcinoma. GI consultation for followup colonoscopy recommended. 2. Small volume perihepatic fluid. Multiple up to 14 mm indeterminate low-density lesions in the liver. Several prominent but indeterminate retroperitoneal lymph nodes in the upper abdomen. 3. Abnormal fat stranding/nodularity continues to the gallbladder fossa, but the gallbladder does not appear inflamed.   Colonoscopy 07/29/14: 1. Large circumferential obstructing friable mass at the hepaticflexure; multiple biopsies performed2. Mild diverticulosis was noted in the sigmoid colon anddescending colon.  Laparoscopy converted to exploratory laparotomy, extended right colectomy, biopsy of peritoneal nodules on diaphragm with closure of diaphragm, cholecystectomy 07/30/14: Done by Dr. Zella Richer.  Dg Chest Port 1 View 07/30/2014: Right IJ venous catheter terminates in the mid SVC. No pneumothorax.   Dg Chest Port 1 View 08/01/2014: Increased bilateral upper lobe and right middle lobe opacities are noted most consistent with pneumonia or possibly edema. Followup radiographs are recommended.   Nm Hepatobiliary Including Gb 08/02/2014: 1. Abnormal radiotracer collection adjacent to the inferior margin of the left hepatic lobe most concerning for a bile leak.   Dg Ercp 08/06/2014: Biliary stent placement.  These images were submitted for radiologic interpretation only. Please see the procedural report for the amount of contrast and the fluoroscopy time utilized.     Dg Chest  2 View 08/08/2014: Decreased bilateral interstitial and airspace opacities which may represent improved edema or infection.  Bibasilar opacities again noted, improved on the right.  Small bilateral pleural effusions again identified.     Ct Abdomen Pelvis W Contrast 08/11/2014: 1. Inflammatory stranding at the celiac axis and root of the mesentery, favor due to acute pancreatitis. No pancreatic necrosis or organized fluid collection identified. CBD stent in place. 2. Postoperative changes from right hemicolectomy and cholecystectomy for treatment of hepatic flexure poorly differentiated adenocarcinoma. No evidence of bowel obstruction. Postoperative drain in place with small volume perihepatic and small to moderate volume pelvic free fluid which appears simple in nature. 3. Moderate pleural effusions with new lower lobe consolidation and bilateral middle lobe patchy opacity suspicious for pneumonia.     Medical Consultants:    Dr. Lucio Edward, GI.  Dr. Jackolyn Confer, Surgery  Anti-Infectives:    Zosyn 07/29/14---> 07/30/14, resumed 08/01/14--->08/08/14  Imipenem 08/08/14--->  Vancomycin 08/10/14--->  Subjective:   Nicholos Johns denies any pain. Appetite remains poor, but wife thinks he may have eaten a bit better today. Last bowel movement was 08/10/14.  Does not feel constipated.  Ambulated twice this a.m.  Objective:    Filed Vitals:   08/13/14 0517 08/13/14 1400 08/13/14 1910 08/14/14 0600  BP: 118/60 133/66 133/57 130/60  Pulse: 70 71 73 70  Temp: 98.4 F (36.9 C) 98.9 F (37.2 C) 98.6 F (37 C) 98.3 F (36.8 C)  TempSrc: Oral Oral Oral Oral  Resp: 16 16 18 19   Height:      Weight:      SpO2: 96% 100% 99% 100%    Intake/Output Summary (Last 24 hours) at 08/14/14 0817 Last data filed at 08/14/14 0700  Gross per 24 hour  Intake   1730 ml  Output   1600 ml  Net    130  ml    Exam: Gen:  NAD Psych: Depressed affect Cardiovascular:  RRR, No M/R/G Respiratory:  Lungs  CTAB Gastrointestinal:  Abdomen soft, NT/ND, + BS Extremities:  No C/E/C   Data Reviewed:    Labs: Basic Metabolic Panel:  Recent Labs Lab 08/08/14 0515 08/09/14 0440 08/10/14 0435 08/11/14 0440 08/12/14 0545  NA 141 135 138 137 141  K 3.8 3.3* 3.4* 3.0* 4.0  CL 110 105 108 102 107  CO2 22 24 26 26 26   GLUCOSE 105* 101* 88 93 76  BUN 10 11 14 11 8   CREATININE 0.44* 0.57 0.49* 0.46* 0.46*  CALCIUM 7.8* 7.2* 7.4* 7.5* 7.7*  MG  --   --  2.1 2.2  --    GFR Estimated Creatinine Clearance: 98.2 mL/min (by C-G formula based on Cr of 0.46). Liver Function Tests:  Recent Labs Lab 08/10/14 0435 08/11/14 0440 08/12/14 0545  AST 19 16 22   ALT 23 15 18   ALKPHOS 139* 124* 148*  BILITOT 0.7 0.1* 0.6  PROT 5.4* 5.2* 5.3*  ALBUMIN 1.7* 1.7* 1.8*    Recent Labs Lab 08/11/14 0440 08/12/14 0545  LIPASE 41 27  AMYLASE 45  --    CBC:  Recent Labs Lab 08/09/14 0440 08/10/14 0435 08/11/14 0440 08/12/14 0545 08/13/14 0523  WBC 10.4 8.1 9.1 10.6* 15.1*  HGB 7.5* 7.7* 7.5* 7.6* 7.8*  HCT 24.6* 25.7* 24.9* 25.5* 26.5*  MCV 79.1 79.8 79.3 79.4 79.8  PLT 456* 451* 392 473* 579*   Sepsis Labs:  Recent Labs Lab 08/08/14 1430  08/10/14 0435 08/11/14 0440 08/12/14 0545 08/13/14 0523  WBC  --   < > 8.1 9.1 10.6* 15.1*  LATICACIDVEN 1.3  --   --   --   --   --   < > = values in this interval not displayed. Microbiology Recent Results (from the past 240 hour(s))  Clostridium Difficile by PCR     Status: None   Collection Time: 08/08/14  6:06 AM  Result Value Ref Range Status   C difficile by pcr NEGATIVE NEGATIVE Final  Culture, blood (routine x 2)     Status: None   Collection Time: 08/08/14  1:47 PM  Result Value Ref Range Status   Specimen Description BLOOD LEFT ASSIST CONTROL  Final   Special Requests BOTTLES DRAWN AEROBIC ONLY 2CC  Final   Culture   Final    NO GROWTH 5 DAYS Performed at [facility]    Report Status 08/14/2014 FINAL  Final   Culture, blood (routine x 2)     Status: None   Collection Time: 08/08/14  2:30 PM  Result Value Ref Range Status   Specimen Description BLOOD LEFT ARM  Final   Special Requests BOTTLES DRAWN AEROBIC ONLY 2CC  Final   Culture   Final    NO GROWTH 5 DAYS Performed at [facility]    Report Status 08/14/2014 FINAL  Final  Urine culture     Status: None   Collection Time: 08/08/14  5:31 PM  Result Value Ref Range Status   Specimen Description URINE, CLEAN CATCH  Final   Special Requests NONE  Final   Colony Count NO GROWTH Performed at [facility]   Final   Culture NO GROWTH Performed at [facility]   Final   Report Status 08/09/2014 FINAL  Final     Medications:   . ALPRAZolam  1 mg Oral TID  . atenolol  100 mg  Oral Daily  . feeding supplement (RESOURCE BREEZE)  1 Container Oral TID BM  . heparin subcutaneous  5,000 Units Subcutaneous 3 times per day  . imipenem-cilastatin  500 mg Intravenous Q6H  . mirtazapine  7.5 mg Oral QHS  . multivitamin  5 mL Oral QHS  . pantoprazole  40 mg Oral QHS  . vancomycin  1,250 mg Intravenous Q8H   Continuous Infusions: . 0.9 % NaCl with KCl 40 mEq / L 10 mL/hr (08/13/14 1330)    Time spent: 25 minutes.   LOS: 17 days   [facility] Pager [phone]. If unable to reach me by pager, please call my cell phone at [phone].  *Please refer to [email or web address], password [email or web address] to get updated schedule on who will round on this patient, as hospitalists switch teams weekly. If 7PM-7AM, please contact night-coverage at [email or web address], password [email or web address] for any overnight needs.  08/14/2014, 8:17 AM

## 2014-08-14 NOTE — Progress Notes (Signed)
Patient ID: Donald Fry, male   DOB: 1955-07-02, 59 y.o.   MRN: [id number] 8 Days Post-Op  Subjective: Pt feels well except no appetite.  He is walking better.  No new abdominal pain  Objective: Vital signs in last 24 hours: Temp:  [98.3 F (36.8 C)-98.9 F (37.2 C)] 98.3 F (36.8 C) (03/12 0600) Pulse Rate:  [70-73] 70 (03/12 0600) Resp:  [16-19] 19 (03/12 0600) BP: (130-133)/(57-66) 130/60 mmHg (03/12 0600) SpO2:  [99 %-100 %] 100 % (03/12 0600) Last BM Date: 08/09/14  Intake/Output from previous day: 03/11 0701 - 03/12 0700 In: 1730 [P.O.:360; I.V.:820; IV Piggyback:550] Out: 1850 [Urine:1850] Intake/Output this shift:    PE: Abd: soft, nontender, incision c/d/i with steristrips  Lab Results:   Recent Labs  08/12/14 0545 08/13/14 0523  WBC 10.6* 15.1*  HGB 7.6* 7.8*  HCT 25.5* 26.5*  PLT 473* 579*   BMET  Recent Labs  08/12/14 0545  NA 141  K 4.0  CL 107  CO2 26  GLUCOSE 76  BUN 8  CREATININE 0.46*  CALCIUM 7.7*   PT/INR No results for input(s): LABPROT, INR in the last 72 hours. CMP     Component Value Date/Time   NA 141 08/12/2014 0545   K 4.0 08/12/2014 0545   CL 107 08/12/2014 0545   CO2 26 08/12/2014 0545   GLUCOSE 76 08/12/2014 0545   BUN 8 08/12/2014 0545   CREATININE 0.46* 08/12/2014 0545   CALCIUM 7.7* 08/12/2014 0545   PROT 5.3* 08/12/2014 0545   ALBUMIN 1.8* 08/12/2014 0545   AST 22 08/12/2014 0545   ALT 18 08/12/2014 0545   ALKPHOS 148* 08/12/2014 0545   BILITOT 0.6 08/12/2014 0545   GFRNONAA >90 08/12/2014 0545   GFRAA >90 08/12/2014 0545   Lipase     Component Value Date/Time   LIPASE 27 08/12/2014 0545       Studies/Results: No results found.  Anti-infectives: Anti-infectives    Start     Dose/Rate Route Frequency Ordered Stop   08/12/14 1200  vancomycin (VANCOCIN) 1,250 mg in sodium chloride 0.9 % 250 mL IVPB     1,250 mg 166.7 mL/hr over 90 Minutes Intravenous Every 8 hours 08/12/14 1050 08/14/14 2359   08/10/14 1400  vancomycin (VANCOCIN) IVPB 1000 mg/200 mL premix  Status:  Discontinued     1,000 mg 200 mL/hr over 60 Minutes Intravenous Every 8 hours 08/10/14 0608 08/12/14 1050   08/10/14 0615  vancomycin (VANCOCIN) 1,500 mg in sodium chloride 0.9 % 500 mL IVPB     1,500 mg 250 mL/hr over 120 Minutes Intravenous  Once 08/10/14 0607 08/10/14 0853   08/08/14 1500  imipenem-cilastatin (PRIMAXIN) 500 mg in sodium chloride 0.9 % 100 mL IVPB     500 mg 200 mL/hr over 30 Minutes Intravenous Every 6 hours 08/08/14 1348 08/14/14 2359   08/08/14 1400  imipenem-cilastatin (PRIMAXIN) 500 mg in sodium chloride 0.9 % 100 mL IVPB  Status:  Discontinued     500 mg 200 mL/hr over 30 Minutes Intravenous 3 times per day 08/08/14 1341 08/08/14 1348   08/08/14 1400  vancomycin (VANCOCIN) IVPB 750 mg/150 ml premix  Status:  Discontinued     750 mg 150 mL/hr over 60 Minutes Intravenous Every 8 hours 08/08/14 1349 08/10/14 0606   08/01/14 1400  piperacillin-tazobactam (ZOSYN) IVPB 3.375 g  Status:  Discontinued     3.375 g 12.5 mL/hr over 240 Minutes Intravenous Every 8 hours 08/01/14 0811 08/08/14 1341   08/01/14  0900  piperacillin-tazobactam (ZOSYN) IVPB 3.375 g     3.375 g 100 mL/hr over 30 Minutes Intravenous  Once 08/01/14 0811 08/01/14 0914   07/31/14 0200  cefoTEtan (CEFOTAN) 2 g in dextrose 5 % 50 mL IVPB     2 g 100 mL/hr over 30 Minutes Intravenous Every 12 hours 07/30/14 2109 07/31/14 0326   07/29/14 2200  piperacillin-tazobactam (ZOSYN) IVPB 3.375 g  Status:  Discontinued     3.375 g 12.5 mL/hr over 240 Minutes Intravenous 3 times per day 07/29/14 1328 07/30/14 2109   07/29/14 1500  piperacillin-tazobactam (ZOSYN) IVPB 3.375 g     3.375 g 100 mL/hr over 30 Minutes Intravenous  Once 07/29/14 1328 07/29/14 1549       Assessment/Plan   1. Right colon tumor, peritoneal implants and right upper quadrant, right lateral abdominal wall, and on gallbladder, cholelithiasis POD 15, s/p Laparoscopy  converted to exploratory laparotomy, extended right colectomy, biopsy of peritoneal nodules on diaphragm with closure of diaphragm, cholecystectomy,07/30/12, Dr. Zella Richer. 1B. Bile leak, no obvious leak seen on ERCP 08/06/14, with stent placement by Dr. Deatra Ina. -on regular diet, but has no appetite.  -will add ensure pudding TID today Anemia -stable Leukocytosis -increased to 15K yesterday.  Will recheck DVT prophylaxis -SCD/heparin ID -Vancomycin and cefotetan, D7 for PNA  Patient is surgically stable.  He has minimal appetite. Encourage protein intake  LOS: 17 days    Donald Fry C. 6/96/7893, 8:10 AM

## 2014-08-15 LAB — BASIC METABOLIC PANEL
Anion gap: 8 (ref 5–15)
BUN: <5 mg/dL — ABNORMAL LOW (ref 6–23)
CHLORIDE: 104 mmol/L (ref 96–112)
CO2: 29 mmol/L (ref 19–32)
Calcium: 7.8 mg/dL — ABNORMAL LOW (ref 8.4–10.5)
Creatinine, Ser: 0.46 mg/dL — ABNORMAL LOW (ref 0.50–1.35)
GFR calc non Af Amer: >90 mL/min (ref >90)
GLUCOSE: 97 mg/dL (ref 70–99)
Potassium: 3.3 mmol/L — ABNORMAL LOW (ref 3.5–5.1)
Sodium: 141 mmol/L (ref 135–145)

## 2014-08-15 LAB — CBC
HEMATOCRIT: 24.8 % — AB (ref 39.0–52.0)
HEMOGLOBIN: 7.4 g/dL — AB (ref 13.0–17.0)
MCH: 23.5 pg — AB (ref 26.0–34.0)
MCHC: 29.8 g/dL — ABNORMAL LOW (ref 30.0–36.0)
MCV: 78.7 fL (ref 78.0–100.0)
PLATELETS: 610 10*3/uL — AB (ref 150–400)
RBC: 3.15 MIL/uL — AB (ref 4.22–5.81)
RDW: 19.7 % — ABNORMAL HIGH (ref 11.5–15.5)
WBC: 9.6 10*3/uL (ref 4.0–10.5)

## 2014-08-15 MED ORDER — SENNA 8.6 MG PO TABS: 1.0000 | ORAL_TABLET | Freq: Every day | ORAL | Status: DC | PRN

## 2014-08-15 MED ORDER — CEFAZOLIN SODIUM-DEXTROSE 2-3 GM-% IV SOLR: 2.0000 g | INTRAVENOUS | Status: DC

## 2014-08-15 MED ORDER — POTASSIUM CHLORIDE CRYS ER 20 MEQ PO TBCR
20.0000 meq | EXTENDED_RELEASE_TABLET | Freq: Two times a day (BID) | ORAL | Status: DC
  Administered 2014-08-15 – 2014-08-16 (×3): 20 meq via ORAL
  Filled 2014-08-15 (×3): qty 1

## 2014-08-15 MED ORDER — SENNA 8.6 MG PO TABS
1.0000 | ORAL_TABLET | Freq: Once | ORAL | Status: AC
  Administered 2014-08-15: 8.6 mg via ORAL
  Filled 2014-08-15: qty 1

## 2014-08-15 NOTE — Progress Notes (Signed)
Progress Note   ZAKAR BROSCH ZOX:[id number] DOB: December 14, 1955 DOA: 07/28/2014 PCP: [name], MD   Brief Narrative:   Donald Fry is an 59 y.o. male with a PMH of hyperlipidemia, anemia, hypertension and tobacco abuse who was admitted 07/28/14 with a chief complaint of a 2 day history of right upper quadrant abdominal pain. Patient also reported having ongoing dyspnea on exertion for several months prior to the admission and unintentional weight loss. Evaluation on the admission included CT abdomen which showed a possible colon cancer near the hepatic flexure with surrounding lymphadenopathy. Patient underwent colonoscopy 07/29/2014 followed by surgical resection of the colon cancer 07/30/2014. Postoperative course has been complicated with bile leak and healthcare associated pneumonia. Additionally, JP on 08/05/14 with bile color and has increased in amount drainge so ERCP done on 08/06/14 with stent in CBD. Patient then developed fever of 102.90F on 08/08/2014. The patient was started on empiric antibiotics and cultures were performed. He continued to have low-grade fevers. As result, repeat CT of the abdomen and pelvis was performed on 08/11/14. In addition, the patient has increased drainage from [place] again on 08/10/14.  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  - Status post exploratory laparotomy, extended right colectomy, biopsy of peritoneal nodules and cholecystectomy on 07/30/14. 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 - During this hospitalization, patient has received 4 units of PRBC blood transfusion. He reports no bleeding since. - Hemoglobin 7.5 on 08/09/2014.  - Hgb has largely remained stable; Baseline Hgb 7-8. - Patient will follow-up with oncology per scheduled  appointment on discharge. - 08/10/14--appreciate Dr. Benay Spice consult-->PET after d/c, place port-a-cath (order placed for IR consult), start oral iron per recommendations.  Active Problems: Depression - Admits to depressed mood.  Wife reports irritability, change in personality. - Remeron started 08/13/14.  Some improvement in appetite.  Malnutrition / Poor appetite - Dietician consult for assessment of nutritional status. - Remeron for appetite stimulation added 08/13/14. - Continue Ensure TID.  New Fever-->HAP/HCAP vs neoplastic fever/Acute respiratory failure -Previously finished zosyn on 08/07/14 for HCAP but spiked fever of 102.90F@1320  on 08/08/14, thought to be from HAP. -May need thoracocentesis r/o empyema if fever returns vs malignant effusion. -Blood cultures x 2 sets--neg; UA--no pyuria; 08/08/14--Cdiff-- NEG.  No elevation of lactic acid. -Completed course of Imipenem and vancomycin 08/14/14.   -pt continued to have fever 100.90F--in the setting of slight increase in abdominal pain and increased JP drainage, repeat CT abdomen and pelvis 3/9 showed Inflammatory stranding at the celiac axis and root of the mesentery, favor due to acute pancreatitis; Postoperative changes from right hemicolectomy and cholecystectomy; bilateral pleural effusions with new bilateral lower lobe consolidations. -Patient not felt to have pancreatitis clinically (or biochemically--lipase 41). -GI reconsulted, no new recommendations.  Postoperative bile leak - HIDA done 08/02/14 showed bile leak.  - 08/06/14 ERCP (Dr. Boykin Peek leak from biliary system-->stent placed. - Seems to be resolving.  Essential hypertension  - BP at goal, continue atenolol.  Hyperlipidemia  - Resume statin.  Anxiety  - Continue Xanax.    Tobacco abuse/COPD -Continue nicotine patch.  Hypokalemia  - Secondary to GI losses. Supplemented.  Hx of ETOH use quit 4 years ago - Not active problem at present.  DVT Prophylaxis   - Heparin subcutaneous ordered.  Code Status: Full. Family Communication: Wife updated at the bedside. Disposition Plan: Home when stable.   IV Access:    Peripheral IV   Procedures and diagnostic studies:   Ct Abdomen Pelvis W Contrast 07/28/14: 1. Markedly abnormal hepatic flexure with wall thickening along a 10-12 cm length but extensive surrounding mesenteric nodularity, and increased regional lymph nodes. The appearance is atypical for acute colitis and highly suspicious for adenocarcinoma. GI consultation for followup colonoscopy recommended. 2. Small volume perihepatic fluid. Multiple up to 14 mm indeterminate low-density lesions in the liver. Several prominent but indeterminate retroperitoneal lymph nodes in the upper abdomen. 3. Abnormal fat stranding/nodularity continues to the gallbladder fossa, but the gallbladder does not appear inflamed.   Colonoscopy 07/29/14: 1. Large circumferential obstructing friable mass at the hepaticflexure; multiple biopsies performed2. Mild diverticulosis was noted in the sigmoid colon anddescending colon.  Laparoscopy converted to exploratory laparotomy, extended right colectomy, biopsy of peritoneal nodules on diaphragm with closure of diaphragm, cholecystectomy 07/30/14: Done by Dr. Zella Richer.  Dg Chest Port 1 View 07/30/2014: Right IJ venous catheter terminates in the mid SVC. No pneumothorax.   Dg Chest Port 1 View 08/01/2014: Increased bilateral upper lobe and right middle lobe opacities are noted most consistent with pneumonia or possibly edema. Followup radiographs are recommended.   Nm Hepatobiliary Including Gb 08/02/2014: 1. Abnormal radiotracer collection adjacent to the inferior margin of the left hepatic lobe most concerning for a bile leak.   Dg Ercp 08/06/2014: Biliary stent placement.  These images were submitted for radiologic interpretation only. Please see the procedural report for the amount of contrast and the fluoroscopy  time utilized.     Dg Chest 2 View 08/08/2014: Decreased bilateral interstitial and airspace opacities which may represent improved edema or infection.  Bibasilar opacities again noted, improved on the right.  Small bilateral pleural effusions again identified.     Ct Abdomen Pelvis W Contrast 08/11/2014: 1. Inflammatory stranding at the celiac axis and root of the mesentery, favor due to acute pancreatitis. No pancreatic necrosis or organized fluid collection identified. CBD stent in place. 2. Postoperative changes from right hemicolectomy and cholecystectomy for treatment of hepatic flexure poorly differentiated adenocarcinoma. No evidence of bowel obstruction. Postoperative drain in place with small volume perihepatic and small to moderate volume pelvic free fluid which appears simple in nature. 3. Moderate pleural effusions with new lower lobe consolidation and bilateral middle lobe patchy opacity suspicious for pneumonia.     Medical Consultants:    Dr. Lucio Edward, GI.  Dr. Jackolyn Confer, Surgery  Anti-Infectives:    Zosyn 07/29/14---> 07/30/14, resumed 08/01/14--->08/08/14  Imipenem 08/08/14--->08/14/14  Vancomycin 08/10/14--->08/14/14  Subjective:   Nicholos Johns denies any pain. Appetite beginning to improve. Last bowel movement was 08/10/14, agreeable to take a laxative now.  Ambulated twice this a.m.  Objective:    Filed Vitals:   08/13/14 1910 08/14/14 0600 08/14/14 1500 08/14/14 2000  BP: 133/57 130/60 131/64 141/73  Pulse: 73 70 75 79  Temp: 98.6 F (37 C) 98.3 F (36.8 C) 98.1 F (36.7 C) 98.1 F (36.7 C)  TempSrc: Oral Oral Axillary Axillary  Resp: 18 19 18 18   Height:      Weight:      SpO2: 99% 100% 97% 95%    Intake/Output Summary (Last 24 hours) at 08/15/14 0732 Last data filed at 08/15/14 0700  Gross per 24 hour  Intake   1250 ml  Output   1850 ml  Net   -600 ml  Exam: Gen:  NAD Psych: Depressed affect Cardiovascular:  RRR, No M/R/G Respiratory:   Lungs CTAB Gastrointestinal:  Abdomen soft, NT/ND, + BS Extremities:  No C/E/C   Data Reviewed:    Labs: Basic Metabolic Panel:  Recent Labs Lab 08/09/14 0440 08/10/14 0435 08/11/14 0440 08/12/14 0545 08/15/14 0539  NA 135 138 137 141 141  K 3.3* 3.4* 3.0* 4.0 3.3*  CL 105 108 102 107 104  CO2 24 26 26 26 29   GLUCOSE 101* 88 93 76 97  BUN 11 14 11 8  <5*  CREATININE 0.57 0.49* 0.46* 0.46* 0.46*  CALCIUM 7.2* 7.4* 7.5* 7.7* 7.8*  MG  --  2.1 2.2  --   --    GFR Estimated Creatinine Clearance: 98.2 mL/min (by C-G formula based on Cr of 0.46). Liver Function Tests:  Recent Labs Lab 08/10/14 0435 08/11/14 0440 08/12/14 0545  AST 19 16 22   ALT 23 15 18   ALKPHOS 139* 124* 148*  BILITOT 0.7 0.1* 0.6  PROT 5.4* 5.2* 5.3*  ALBUMIN 1.7* 1.7* 1.8*    Recent Labs Lab 08/11/14 0440 08/12/14 0545  LIPASE 41 27  AMYLASE 45  --    CBC:  Recent Labs Lab 08/10/14 0435 08/11/14 0440 08/12/14 0545 08/13/14 0523 08/15/14 0539  WBC 8.1 9.1 10.6* 15.1* 9.6  HGB 7.7* 7.5* 7.6* 7.8* 7.4*  HCT 25.7* 24.9* 25.5* 26.5* 24.8*  MCV 79.8 79.3 79.4 79.8 78.7  PLT 451* 392 473* 579* 610*   Sepsis Labs:  Recent Labs Lab 08/08/14 1430  08/11/14 0440 08/12/14 0545 08/13/14 0523 08/15/14 0539  WBC  --   < > 9.1 10.6* 15.1* 9.6  LATICACIDVEN 1.3  --   --   --   --   --   < > = values in this interval not displayed. Microbiology Recent Results (from the past 240 hour(s))  Clostridium Difficile by PCR     Status: None   Collection Time: 08/08/14  6:06 AM  Result Value Ref Range Status   C difficile by pcr NEGATIVE NEGATIVE Final  Culture, blood (routine x 2)     Status: None   Collection Time: 08/08/14  1:47 PM  Result Value Ref Range Status   Specimen Description BLOOD LEFT ASSIST CONTROL  Final   Special Requests BOTTLES DRAWN AEROBIC ONLY 2CC  Final   Culture   Final    NO GROWTH 5 DAYS Performed at [facility]    Report Status 08/14/2014 FINAL  Final    Culture, blood (routine x 2)     Status: None   Collection Time: 08/08/14  2:30 PM  Result Value Ref Range Status   Specimen Description BLOOD LEFT ARM  Final   Special Requests BOTTLES DRAWN AEROBIC ONLY 2CC  Final   Culture   Final    NO GROWTH 5 DAYS Performed at [facility]    Report Status 08/14/2014 FINAL  Final  Urine culture     Status: None   Collection Time: 08/08/14  5:31 PM  Result Value Ref Range Status   Specimen Description URINE, CLEAN CATCH  Final   Special Requests NONE  Final   Colony Count NO GROWTH Performed at [facility]   Final   Culture NO GROWTH Performed at [facility]   Final   Report Status 08/09/2014 FINAL  Final     Medications:   . ALPRAZolam  1 mg Oral TID  . atenolol  100 mg Oral Daily  .  feeding supplement (ENSURE)  1 Container Oral TID BM  . feeding supplement (RESOURCE BREEZE)  1 Container Oral TID BM  . heparin subcutaneous  5,000 Units Subcutaneous 3 times per day  . iron polysaccharides  150 mg Oral Daily  . mirtazapine  7.5 mg Oral QHS  . multivitamin with minerals  1 tablet Oral Daily  . pantoprazole  40 mg Oral QHS  . pravastatin  40 mg Oral q1800   Continuous Infusions: . 0.9 % NaCl with KCl 40 mEq / L 10 mL/hr (08/14/14 1720)    Time spent: 25 minutes.   LOS: 18 days   [facility] Pager [phone]. If unable to reach me by pager, please call my cell phone at [phone].  *Please refer to [email or web address], password [email or web address] to get updated schedule on who will round on this patient, as hospitalists switch teams weekly. If 7PM-7AM, please contact night-coverage at [email or web address], password [email or web address] for any overnight needs.  08/15/2014, 7:32 AM

## 2014-08-15 NOTE — Progress Notes (Signed)
Patient ID: Donald Fry, male   DOB: 1956-03-21, 59 y.o.   MRN: [id number] [facility] Progress Note:   9 Days Post-Op  Subjective: Mental status is clear.  Feeling much better and appetite is coming back Objective: Vital signs in last 24 hours: Temp:  [98.1 F (36.7 C)] 98.1 F (36.7 C) (03/12 2000) Pulse Rate:  [68-79] 68 (03/13 1000) Resp:  [18] 18 (03/12 2000) BP: (131-141)/(64-73) 134/67 mmHg (03/13 1000) SpO2:  [90 %-100 %] 93 % (03/13 1013) Weight:  [157 lb 4.8 oz (71.351 kg)] 157 lb 4.8 oz (71.351 kg) (03/13 0940)  Intake/Output from previous day: 03/12 0701 - 03/13 0700 In: 1250 [P.O.:360; I.V.:240; IV Piggyback:650] Out: 1850 [Urine:1850] Intake/Output this shift: Total I/O In: 120 [P.O.:120] Out: 150 [Urine:150]  Physical Exam: Work of breathing is normal.  No pain  Lab Results:  Results for orders placed or performed during the hospital encounter of 07/28/14 (from the past 48 hour(s))  CBC     Status: Abnormal   Collection Time: 08/15/14  5:39 AM  Result Value Ref Range   WBC 9.6 4.0 - 10.5 K/uL   RBC 3.15 (L) 4.22 - 5.81 MIL/uL   Hemoglobin 7.4 (L) 13.0 - 17.0 g/dL   HCT 24.8 (L) 39.0 - 52.0 %   MCV 78.7 78.0 - 100.0 fL   MCH 23.5 (L) 26.0 - 34.0 pg   MCHC 29.8 (L) 30.0 - 36.0 g/dL   RDW 19.7 (H) 11.5 - 15.5 %   Platelets 610 (H) 150 - 400 K/uL  Basic metabolic panel     Status: Abnormal   Collection Time: 08/15/14  5:39 AM  Result Value Ref Range   Sodium 141 135 - 145 mmol/L   Potassium 3.3 (L) 3.5 - 5.1 mmol/L   Chloride 104 96 - 112 mmol/L   CO2 29 19 - 32 mmol/L   Glucose, Bld 97 70 - 99 mg/dL   BUN <5 (L) 6 - 23 mg/dL   Creatinine, Ser 0.46 (L) 0.50 - 1.35 mg/dL   Calcium 7.8 (L) 8.4 - 10.5 mg/dL   GFR calc non Af Amer >90 >90 mL/min   GFR calc Af Amer >90 >90 mL/min    Comment: (NOTE) The eGFR has been calculated using the CKD EPI equation. This calculation has not been validated in all clinical situations. eGFR's persistently  <90 mL/min signify possible Chronic Kidney Disease.    Anion gap 8 5 - 15    Radiology/Results: No results found.  Anti-infectives: Anti-infectives    Start     Dose/Rate Route Frequency Ordered Stop   08/12/14 1200  vancomycin (VANCOCIN) 1,250 mg in sodium chloride 0.9 % 250 mL IVPB     1,250 mg 166.7 mL/hr over 90 Minutes Intravenous Every 8 hours 08/12/14 1050 08/14/14 2130   08/10/14 1400  vancomycin (VANCOCIN) IVPB 1000 mg/200 mL premix  Status:  Discontinued     1,000 mg 200 mL/hr over 60 Minutes Intravenous Every 8 hours 08/10/14 0608 08/12/14 1050   08/10/14 0615  vancomycin (VANCOCIN) 1,500 mg in sodium chloride 0.9 % 500 mL IVPB     1,500 mg 250 mL/hr over 120 Minutes Intravenous  Once 08/10/14 0607 08/10/14 0853   08/08/14 1500  imipenem-cilastatin (PRIMAXIN) 500 mg in sodium chloride 0.9 % 100 mL IVPB     500 mg 200 mL/hr over 30 Minutes Intravenous Every 6 hours 08/08/14 1348 08/15/14 0354   08/08/14 1400  imipenem-cilastatin (PRIMAXIN) 500 mg in sodium chloride 0.9 %  100 mL IVPB  Status:  Discontinued     500 mg 200 mL/hr over 30 Minutes Intravenous 3 times per day 08/08/14 1341 08/08/14 1348   08/08/14 1400  vancomycin (VANCOCIN) IVPB 750 mg/150 ml premix  Status:  Discontinued     750 mg 150 mL/hr over 60 Minutes Intravenous Every 8 hours 08/08/14 1349 08/10/14 0606   08/01/14 1400  piperacillin-tazobactam (ZOSYN) IVPB 3.375 g  Status:  Discontinued     3.375 g 12.5 mL/hr over 240 Minutes Intravenous Every 8 hours 08/01/14 0811 08/08/14 1341   08/01/14 0900  piperacillin-tazobactam (ZOSYN) IVPB 3.375 g     3.375 g 100 mL/hr over 30 Minutes Intravenous  Once 08/01/14 0811 08/01/14 0914   07/31/14 0200  cefoTEtan (CEFOTAN) 2 g in dextrose 5 % 50 mL IVPB     2 g 100 mL/hr over 30 Minutes Intravenous Every 12 hours 07/30/14 2109 07/31/14 0326   07/29/14 2200  piperacillin-tazobactam (ZOSYN) IVPB 3.375 g  Status:  Discontinued     3.375 g 12.5 mL/hr over 240  Minutes Intravenous 3 times per day 07/29/14 1328 07/30/14 2109   07/29/14 1500  piperacillin-tazobactam (ZOSYN) IVPB 3.375 g     3.375 g 100 mL/hr over 30 Minutes Intravenous  Once 07/29/14 1328 07/29/14 1549      Assessment/Plan: Problem List: Patient Active Problem List   Diagnosis Date Noted  . Depression 08/13/2014  . Poor appetite 08/13/2014  . Fever 08/08/2014  . Bronchospasm 08/02/2014  . Bile leak, postoperative 08/02/2014  . HCAP (healthcare-associated pneumonia) 08/02/2014  . Acute respiratory failure with hypoxia 08/01/2014  . Cancer of transverse colon with hemorrhage s/p colectomy 07/30/2014 07/30/2014  . Lower GI bleed   . Right sided abdominal pain   . GI bleed 07/28/2014  . Essential hypertension 07/28/2014  . Hyperlipidemia 07/28/2014  . Anxiety state 07/28/2014  . Hypokalemia 07/28/2014  . Acute blood loss anemia 07/28/2014  . Tobacco abuse 07/28/2014    Appetite has returned. Hopefully he can go home soon.   9 Days Post-Op    LOS: 18 days   Matt B. Hassell Done, MD, [facility] [phone] beeper [phone]  08/15/2014 10:17 AM

## 2014-08-16 ENCOUNTER — Encounter (HOSPITAL_COMMUNITY): Payer: Self-pay | Admitting: Certified Registered Nurse Anesthetist

## 2014-08-16 ENCOUNTER — Telehealth: Payer: Self-pay | Admitting: Hematology & Oncology

## 2014-08-16 ENCOUNTER — Inpatient Hospital Stay (HOSPITAL_COMMUNITY): Payer: Medicaid Other | Admitting: Certified Registered Nurse Anesthetist

## 2014-08-16 ENCOUNTER — Encounter (HOSPITAL_COMMUNITY)
Admission: EM | Disposition: A | Discharge status: Home Health | Payer: Self-pay | Source: Home / Self Care | Attending: Internal Medicine

## 2014-08-16 ENCOUNTER — Telehealth: Payer: Self-pay | Admitting: *Deleted

## 2014-08-16 ENCOUNTER — Inpatient Hospital Stay (HOSPITAL_COMMUNITY): Payer: Medicaid Other

## 2014-08-16 HISTORY — PX: PORTACATH PLACEMENT: SHX2246

## 2014-08-16 LAB — CBC
HCT: 26.7 % — ABNORMAL LOW (ref 39.0–52.0)
HEMOGLOBIN: 7.9 g/dL — AB (ref 13.0–17.0)
MCH: 23.2 pg — ABNORMAL LOW (ref 26.0–34.0)
MCHC: 29.6 g/dL — ABNORMAL LOW (ref 30.0–36.0)
MCV: 78.5 fL (ref 78.0–100.0)
PLATELETS: 672 10*3/uL — AB (ref 150–400)
RBC: 3.4 MIL/uL — AB (ref 4.22–5.81)
RDW: 19.5 % — ABNORMAL HIGH (ref 11.5–15.5)
WBC: 8.1 10*3/uL (ref 4.0–10.5)

## 2014-08-16 LAB — BASIC METABOLIC PANEL
Anion gap: 11 (ref 5–15)
BUN: 5 mg/dL — AB (ref 6–23)
CALCIUM: 8.3 mg/dL — AB (ref 8.4–10.5)
CO2: 30 mmol/L (ref 19–32)
CREATININE: 0.5 mg/dL (ref 0.50–1.35)
Chloride: 102 mmol/L (ref 96–112)
GFR calc Af Amer: >90 mL/min (ref >90)
GFR calc non Af Amer: >90 mL/min (ref >90)
GLUCOSE: 96 mg/dL (ref 70–99)
Potassium: 3.9 mmol/L (ref 3.5–5.1)
Sodium: 143 mmol/L (ref 135–145)

## 2014-08-16 LAB — APTT: aPTT: 48 seconds — ABNORMAL HIGH (ref 24–37)

## 2014-08-16 LAB — PROTIME-INR
INR: 1.29 (ref 0.00–1.49)
Prothrombin Time: 16.2 seconds — ABNORMAL HIGH (ref 11.6–15.2)

## 2014-08-16 SURGERY — INSERTION, TUNNELED CENTRAL VENOUS DEVICE, WITH PORT
Anesthesia: General | Site: Chest | Laterality: Right

## 2014-08-16 MED ORDER — LIDOCAINE HCL (CARDIAC) 20 MG/ML IV SOLN
INTRAVENOUS | Status: AC
  Filled 2014-08-16: qty 5

## 2014-08-16 MED ORDER — MIDAZOLAM HCL 2 MG/2ML IJ SOLN
INTRAMUSCULAR | Status: AC
  Filled 2014-08-16: qty 2

## 2014-08-16 MED ORDER — PROPOFOL 10 MG/ML IV BOLUS
INTRAVENOUS | Status: DC | PRN
Start: 2014-08-16 — End: 2014-08-16
  Administered 2014-08-16: 150 mg via INTRAVENOUS

## 2014-08-16 MED ORDER — LIDOCAINE HCL (CARDIAC) 20 MG/ML IV SOLN
INTRAVENOUS | Status: DC | PRN
  Administered 2014-08-16: 100 mg via INTRAVENOUS

## 2014-08-16 MED ORDER — SENNA 8.6 MG PO TABS: 1.0000 | ORAL_TABLET | Freq: Every day | ORAL | Status: AC | PRN

## 2014-08-16 MED ORDER — LACTATED RINGERS IV SOLN
INTRAVENOUS | Status: DC
  Administered 2014-08-16: 1000 mL via INTRAVENOUS

## 2014-08-16 MED ORDER — BUPIVACAINE-EPINEPHRINE 0.25% -1:200000 IJ SOLN
INTRAMUSCULAR | Status: AC
  Filled 2014-08-16: qty 1

## 2014-08-16 MED ORDER — HEPARIN SOD (PORK) LOCK FLUSH 100 UNIT/ML IV SOLN
INTRAVENOUS | Status: AC
  Filled 2014-08-16: qty 5

## 2014-08-16 MED ORDER — ONDANSETRON HCL 4 MG/2ML IJ SOLN
INTRAMUSCULAR | Status: DC | PRN
  Administered 2014-08-16: 4 mg via INTRAVENOUS

## 2014-08-16 MED ORDER — EPHEDRINE SULFATE 50 MG/ML IJ SOLN
INTRAMUSCULAR | Status: AC
  Filled 2014-08-16: qty 1

## 2014-08-16 MED ORDER — PROPOFOL 10 MG/ML IV BOLUS
INTRAVENOUS | Status: AC
  Filled 2014-08-16: qty 20

## 2014-08-16 MED ORDER — ONDANSETRON HCL 4 MG/2ML IJ SOLN
INTRAMUSCULAR | Status: AC
  Filled 2014-08-16: qty 2

## 2014-08-16 MED ORDER — HEPARIN SOD (PORK) LOCK FLUSH 100 UNIT/ML IV SOLN
INTRAVENOUS | Status: DC | PRN
  Administered 2014-08-16: 500 [IU] via INTRAVENOUS

## 2014-08-16 MED ORDER — OXYCODONE HCL 5 MG PO TABS: 5.0000 mg | ORAL_TABLET | Freq: Once | ORAL | Status: DC | PRN

## 2014-08-16 MED ORDER — BUPIVACAINE-EPINEPHRINE 0.25% -1:200000 IJ SOLN
INTRAMUSCULAR | Status: DC | PRN
  Administered 2014-08-16: 8 mL

## 2014-08-16 MED ORDER — 0.9 % SODIUM CHLORIDE (POUR BTL) OPTIME
TOPICAL | Status: DC | PRN
  Administered 2014-08-16: 1000 mL

## 2014-08-16 MED ORDER — MIDAZOLAM HCL 5 MG/5ML IJ SOLN
INTRAMUSCULAR | Status: DC | PRN
  Administered 2014-08-16: 2 mg via INTRAVENOUS

## 2014-08-16 MED ORDER — FENTANYL CITRATE 0.05 MG/ML IJ SOLN
INTRAMUSCULAR | Status: DC | PRN
  Administered 2014-08-16: 25 ug via INTRAVENOUS
  Administered 2014-08-16: 50 ug via INTRAVENOUS
  Administered 2014-08-16: 25 ug via INTRAVENOUS

## 2014-08-16 MED ORDER — OXYCODONE HCL 5 MG/5ML PO SOLN
5.0000 mg | Freq: Once | ORAL | Status: DC | PRN
  Filled 2014-08-16: qty 5

## 2014-08-16 MED ORDER — EPHEDRINE SULFATE 50 MG/ML IJ SOLN
INTRAMUSCULAR | Status: DC | PRN
  Administered 2014-08-16: 10 mg via INTRAVENOUS

## 2014-08-16 MED ORDER — ENSURE PUDDING PO PUDG: 1.0000 | Freq: Three times a day (TID) | ORAL | Status: DC

## 2014-08-16 MED ORDER — SODIUM CHLORIDE 0.9 % IR SOLN
Freq: Once | Status: AC
  Administered 2014-08-16: 2 mL
  Filled 2014-08-16: qty 1.2

## 2014-08-16 MED ORDER — PROMETHAZINE HCL 25 MG/ML IJ SOLN: 6.2500 mg | INTRAMUSCULAR | Status: DC | PRN

## 2014-08-16 MED ORDER — PHENYLEPHRINE HCL 10 MG/ML IJ SOLN
INTRAMUSCULAR | Status: DC | PRN
  Administered 2014-08-16 (×3): 80 ug via INTRAVENOUS

## 2014-08-16 MED ORDER — MIRTAZAPINE 15 MG PO TABS: 7.5000 mg | ORAL_TABLET | Freq: Every day | ORAL | Status: DC

## 2014-08-16 MED ORDER — SODIUM CHLORIDE 0.9 % IJ SOLN
INTRAMUSCULAR | Status: AC
  Filled 2014-08-16: qty 10

## 2014-08-16 MED ORDER — CEFAZOLIN SODIUM-DEXTROSE 2-3 GM-% IV SOLR
2.0000 g | INTRAVENOUS | Status: AC
  Administered 2014-08-16 (×2): 2 g via INTRAVENOUS
  Filled 2014-08-16: qty 50

## 2014-08-16 MED ORDER — HYDROMORPHONE HCL 1 MG/ML IJ SOLN: 0.2500 mg | INTRAMUSCULAR | Status: DC | PRN

## 2014-08-16 MED ORDER — FENTANYL CITRATE 0.05 MG/ML IJ SOLN
INTRAMUSCULAR | Status: AC
  Filled 2014-08-16: qty 2

## 2014-08-16 SURGICAL SUPPLY — 33 items
BAG DECANTER FOR FLEXI CONT (MISCELLANEOUS) ×2 IMPLANT
BENZOIN TINCTURE PRP APPL 2/3 (GAUZE/BANDAGES/DRESSINGS) ×2 IMPLANT
BLADE HEX COATED 2.75 (ELECTRODE) ×2 IMPLANT
BLADE SURG 15 STRL LF DISP TIS (BLADE) ×1 IMPLANT
BLADE SURG 15 STRL SS (BLADE) ×1
DECANTER SPIKE VIAL GLASS SM (MISCELLANEOUS) ×2 IMPLANT
DRAPE C-ARM 42X120 X-RAY (DRAPES) ×2 IMPLANT
DRAPE LAPAROTOMY TRNSV 102X78 (DRAPE) ×2 IMPLANT
ELECT REM PT RETURN 9FT ADLT (ELECTROSURGICAL) ×2
ELECTRODE REM PT RTRN 9FT ADLT (ELECTROSURGICAL) ×1 IMPLANT
GAUZE SPONGE 4X4 12PLY STRL (GAUZE/BANDAGES/DRESSINGS) IMPLANT
GAUZE SPONGE 4X4 16PLY XRAY LF (GAUZE/BANDAGES/DRESSINGS) IMPLANT
GLOVE BIOGEL PI IND STRL 7.0 (GLOVE) ×1 IMPLANT
GLOVE BIOGEL PI INDICATOR 7.0 (GLOVE) ×1
GLOVE ECLIPSE 7.5 STRL STRAW (GLOVE) ×2 IMPLANT
GLOVE SURG SS PI 6.5 STRL IVOR (GLOVE) ×2 IMPLANT
GOWN STRL REUS W/TWL XL LVL3 (GOWN DISPOSABLE) ×6 IMPLANT
KIT BASIN OR (CUSTOM PROCEDURE TRAY) ×2 IMPLANT
KIT PORT POWER 8FR ISP CVUE (Catheter) ×2 IMPLANT
KIT PORT POWER ISP 8FR (Catheter) ×2 IMPLANT
LIQUID BAND (GAUZE/BANDAGES/DRESSINGS) ×2 IMPLANT
MARKER SKIN DUAL TIP RULER LAB (MISCELLANEOUS) ×2 IMPLANT
NEEDLE HYPO 22GX1.5 SAFETY (NEEDLE) IMPLANT
NEEDLE HYPO 25X1 1.5 SAFETY (NEEDLE) ×2 IMPLANT
NS IRRIG 1000ML POUR BTL (IV SOLUTION) ×2 IMPLANT
PACK BASIC VI WITH GOWN DISP (CUSTOM PROCEDURE TRAY) ×2 IMPLANT
PENCIL BUTTON HOLSTER BLD 10FT (ELECTRODE) ×2 IMPLANT
STRIP CLOSURE SKIN 1/2X4 (GAUZE/BANDAGES/DRESSINGS) IMPLANT
SUT MNCRL AB 4-0 PS2 18 (SUTURE) ×2 IMPLANT
SUT PROLENE 2 0 CT2 30 (SUTURE) ×2 IMPLANT
SYR CONTROL 10ML LL (SYRINGE) ×2 IMPLANT
SYRINGE 10CC LL (SYRINGE) ×4 IMPLANT
TOWEL OR 17X26 10 PK STRL BLUE (TOWEL DISPOSABLE) ×2 IMPLANT

## 2014-08-16 NOTE — Telephone Encounter (Signed)
Estill Bamberg RN will give pt 3-18 appointment time and information

## 2014-08-16 NOTE — Transfer of Care (Signed)
Immediate Anesthesia Transfer of Care Note  Patient: Donald Fry  Procedure(s) Performed: Procedure(s) (LRB): INSERTION PORT-A-CATH (Right)  Patient Location: PACU  Anesthesia Type: General  Level of Consciousness: sedated, patient cooperative and responds to stimulation  Airway & Oxygen Therapy: Patient Spontanous Breathing and Patient connected to face mask oxgen  Post-op Assessment: Report given to PACU RN and Post -op Vital signs reviewed and stable  Post vital signs: Reviewed and stable  Complications: No apparent anesthesia complications

## 2014-08-16 NOTE — Discharge Instructions (Signed)

## 2014-08-16 NOTE — Anesthesia Procedure Notes (Signed)
Procedure Name: LMA Insertion Date/Time: 08/16/2014 1:54 PM Performed by: Maxwell Caul Pre-anesthesia Checklist: Patient identified, Emergency Drugs available, Suction available and Patient being monitored Patient Re-evaluated:Patient Re-evaluated prior to inductionOxygen Delivery Method: Circle system utilized Preoxygenation: Pre-oxygenation with 100% oxygen Intubation Type: IV induction LMA: LMA inserted LMA Size: 4.0 Number of attempts: 1 Placement Confirmation: positive ETCO2 and breath sounds checked- equal and bilateral Tube secured with: Tape Dental Injury: Teeth and Oropharynx as per pre-operative assessment

## 2014-08-16 NOTE — Telephone Encounter (Signed)
Call from wife requesting to transfer his cancer care to Dr. Burney Gauze in [facility] since his office is closer to home. Made her aware that the referral will be sent to Dr. Marin Olp and she will be called at home with the new appointment date/time. Assured her that it is best to have his care where he will be most comfortable.

## 2014-08-16 NOTE — Op Note (Signed)
Preoperative diagnosis: Cancer of the colon  Postoperative diagnosis: Same  Procedure: Placement of ClearVue subcutaneous venous port  Surgeon: Excell Seltzer M.D.  Anesthesia: LMA General  Description of procedure: Patient is brought to the operating room and placed in the supine position on the operating table. IV sedation was administered. The entire upper chest and neck were widely sterilely prepped and draped. Local anesthesia was used to infiltrate the insertion of port site. The right subclavian vein was cannulated with a needle and guidewire without difficulty and position in the superior vena cava was confirmed by fluoroscopy. The introducer was then placed over the guidewire and the flushed catheter placed via the introducer which was stripped away and the tip of the catheter positioned near the cavoatrial junction. A small transverse incision was made in the anterior chest wall and subcutaneous pocket created. The catheter was tunneled into the pocket, trimmed to length, and attached to the flushed port which was positioned in the pocket. The port was sutured to the chest wall with interrupted 2-0 Prolene. The incisions were closed with subcutaneous interrupted Monocryl and the skin incisions closed with subcuticular Monocryl and Dermabond. The port was accessed and flushed and aspirated easily and was left flushed with concentrated heparin solution. Sponge needle as the counts were correct. The patient was taken to recovery in good condition.  Laverta Harnisch T  08/16/2014

## 2014-08-16 NOTE — Progress Notes (Signed)
10 Days Post-Op  Subjective: Comfortable in bed still on O2.  Wounds healing well, dressing for drain removed and it looks fine.  He is ambulating and taking regular diet, + BM.    Objective: Vital signs in last 24 hours: Temp:  [98.1 F (36.7 C)-98.3 F (36.8 C)] 98.2 F (36.8 C) (03/14 0530) Pulse Rate:  [70-84] 70 (03/14 0530) Resp:  [17-19] 19 (03/14 0530) BP: (129-144)/(66-71) 129/71 mmHg (03/14 0530) SpO2:  [90 %-94 %] 92 % (03/14 0530) Last BM Date: 08/10/14 840 PO recorded Afebrile, VSS Labs stable Intake/Output from previous day: 03/13 0701 - 03/14 0700 In: 840 [P.O.:840] Out: 600 [Urine:600] Intake/Output this shift:    General appearance: alert, cooperative and no distress GI: soft, not really tender, wounds healing well.  + BS, and +BM  Lab Results:   Recent Labs  08/15/14 0539 08/16/14 0512  WBC 9.6 8.1  HGB 7.4* 7.9*  HCT 24.8* 26.7*  PLT 610* 672*    BMET  Recent Labs  08/15/14 0539 08/16/14 0512  NA 141 143  K 3.3* 3.9  CL 104 102  CO2 29 30  GLUCOSE 97 96  BUN <5* 5*  CREATININE 0.46* 0.50  CALCIUM 7.8* 8.3*   PT/INR  Recent Labs  08/16/14 0512  LABPROT 16.2*  INR 1.29     Recent Labs Lab 08/10/14 0435 08/11/14 0440 08/12/14 0545  AST 19 16 22   ALT 23 15 18   ALKPHOS 139* 124* 148*  BILITOT 0.7 0.1* 0.6  PROT 5.4* 5.2* 5.3*  ALBUMIN 1.7* 1.7* 1.8*     Lipase     Component Value Date/Time   LIPASE 27 08/12/2014 0545     Studies/Results: Dg Chest Port 1 View  08/16/2014   CLINICAL DATA:  Bilateral pleural effusion  EXAM: PORTABLE CHEST - 1 VIEW  COMPARISON:  08/08/2014  FINDINGS: Diffuse interstitial thickening is present, worsened from 08/08/2014 and likely representing interstitial fluid. Mild airspace opacity is also present in the central and basilar regions. The findings most likely represent congestive heart failure. Heart size appears unchanged. No large effusions are evident but there probably is at least a  small right effusion.  IMPRESSION: Probable congestive heart failure.   Electronically Signed   By: Andreas Newport M.D.   On: 08/16/2014 06:11    Medications: . ALPRAZolam  1 mg Oral TID  . atenolol  100 mg Oral Daily  .  ceFAZolin (ANCEF) IV  2 g Intravenous On Call  . feeding supplement (ENSURE)  1 Container Oral TID BM  . feeding supplement (RESOURCE BREEZE)  1 Container Oral TID BM  . heparin subcutaneous  5,000 Units Subcutaneous 3 times per day  . iron polysaccharides  150 mg Oral Daily  . mirtazapine  7.5 mg Oral QHS  . multivitamin with minerals  1 tablet Oral Daily  . pantoprazole  40 mg Oral QHS  . potassium chloride  20 mEq Oral BID  . pravastatin  40 mg Oral q1800   1. Gallbladder - CHRONIC CHOLECYSTITIS AND CHOLELITHIASIS. 2. 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 3. Colon, segmental resection for tumor, right and distal ileum - INVASIVE POORLY DIFFERENTIATED ADENOCARCINOMA, WITH SIGNET RING CELL FEATURES AND TUMOR NECROSIS, INVADING THOUGH THE MUSCULARIS PROPRIA INTO PERICOLONIC FATTY TISSUE, EXTENDING TO THE SEROSA WITH EXTENSIVE ANGIOLYMPHATIC INVASION AND PERINEURAL INVASION PRESENT. - MULTIPLE EXTRAMURAL SATELLITE TUMOR NODULES. - APPENDIX: APPENDICEAL TISSUE, NO  EVIDENCE OF ACTIVE INFLAMMATION OR NEOPLASM. - ELEVEN OF SEVENTEEN LYMPH NODES, POSITIVE FOR METASTATIC CARCINOMA (11/17).   Specimen: Right Assessment/Plan 1. Right colon tumor, peritoneal implants and right upper quadrant, right lateral abdominal wall, and on gallbladder, cholelithiasis 1A. Laparoscopy converted to exploratory laparotomy, extended right colectomy, biopsy of peritoneal nodules on diaphragm with closure of diaphragm, cholecystectomy,07/30/12, POD 13, Dr. Zella Richer. 1B. Bile leak, no obvious leak seen on ERCP 08/06/14, with stent placement by  Dr. Deatra Ina. -start clear liquids, hopefully can advance tomorrow 2. Anemia/Leukocytosis/fevers 3. Hypertension 4. Tobacco use 39 years 5. Hx of ETOH use quit 4 years ago 6. Dyslipidemia  7. DVT prophylaxis - SCD/heparin 8. He has had 9 days of Zosyn since admit and is now on Vancomycin and cefotetan, this will be day 5. 9. Chronic benzodiazapine use - less anxious today 10. Pneumonia post op 11. Hypokalemia will replace with IV fluids 12. Concern for pancreatitis based on CT scan 08/10/14. Not really consistent with exam and chemical studies. Fluid is clearer today.    Plan:  Pt says they are planing to do a Port today and then he can go home.  I have told him it is OK to shower and take off the steri strips as they begin to peel off. I will put follow up information for Dr. Zella Richer in the AVS.  IR called and discussed with [place].  I have talked to Dr. Excell Seltzer and he will do the port this afternoon.  He can go home after this.   LOS: 19 days    Wahneta Derocher 08/16/2014

## 2014-08-16 NOTE — Progress Notes (Signed)
PT Cancellation Note  ___Treatment cancelled today due to medical issues with patient which prohibited therapy  _X_ Treatment cancelled today due to patient receiving procedure or test ........Marland Kitchen Insertion of Port-a-cath under anesthesia  ___ Treatment cancelled today due to patient's refusal to participate   ___ Treatment cancelled today due to   Rica Koyanagi  PTA [facility]  Acute  Rehab Pager      [phone]

## 2014-08-16 NOTE — Anesthesia Postprocedure Evaluation (Signed)
Anesthesia Post Note  Patient: Donald Fry  Procedure(s) Performed: Procedure(s) (LRB): INSERTION PORT-A-CATH (Right)  Anesthesia type: general  Patient location: PACU  Post pain: Pain level controlled  Post assessment: Patient's Cardiovascular Status Stable  Last Vitals:  Filed Vitals:   08/16/14 1445  BP: 130/73  Pulse: 68  Temp: 36.7 C  Resp:     Post vital signs: Reviewed and stable  Level of consciousness: sedated  Complications: No apparent anesthesia complications

## 2014-08-16 NOTE — Anesthesia Preprocedure Evaluation (Addendum)
Anesthesia Evaluation  Patient identified by MRN, date of birth, ID band Patient awake    Reviewed: Allergy & Precautions, NPO status , Patient's Chart, lab work & pertinent test results  Airway Mallampati: II  TM Distance: >3 FB Neck ROM: Full    Dental no notable dental hx.    Pulmonary Current Smoker,  breath sounds clear to auscultation  Pulmonary exam normal       Cardiovascular hypertension, Pt. on medications Rhythm:Regular Rate:Normal     Neuro/Psych negative neurological ROS  negative psych ROS   GI/Hepatic negative GI ROS, Neg liver ROS,   Endo/Other  negative endocrine ROS  Renal/GU negative Renal ROS  negative genitourinary   Musculoskeletal negative musculoskeletal ROS (+)   Abdominal   Peds negative pediatric ROS (+)  Hematology negative hematology ROS (+)   Anesthesia Other Findings   Reproductive/Obstetrics negative OB ROS                           Anesthesia Physical  Anesthesia Plan  ASA: II  Anesthesia Plan: General   Post-op Pain Management:    Induction: Intravenous  Airway Management Planned: LMA  Additional Equipment:   Intra-op Plan:   Post-operative Plan:   Informed Consent: I have reviewed the patients History and Physical, chart, labs and discussed the procedure including the risks, benefits and alternatives for the proposed anesthesia with the patient or authorized representative who has indicated his/her understanding and acceptance.   Dental advisory given  Plan Discussed with: CRNA  Anesthesia Plan Comments:       Anesthesia Quick Evaluation

## 2014-08-16 NOTE — Discharge Summary (Signed)
Physician Discharge Summary  Donald Fry ZOX:[id number] DOB: 11-08-1955 DOA: 07/28/2014  PCP: [name], MD  Admit date: 07/28/2014 Discharge date: 08/16/2014   Recommendations for Outpatient Follow-Up:   1. The patient has F/U scheduled with Dr. Marin Olp on 08/20/14.   Discharge Diagnosis:   Principal Problem:    Cancer of transverse colon with hemorrhage s/p colectomy 07/30/2014 Active Problems:    GI bleed    Essential hypertension    Hyperlipidemia    Anxiety state    Hypokalemia    Acute blood loss anemia    Tobacco abuse    Lower GI bleed    Right sided abdominal pain    Acute respiratory failure with hypoxia    Bronchospasm    Bile leak, postoperative    HCAP (healthcare-associated pneumonia)    Fever    Depression    Poor appetite  Discharge Condition: Improved.  Diet recommendation: Regular.   History of Present Illness:   Donald Fry is an 59 y.o. male with a PMH of hyperlipidemia, anemia, hypertension and tobacco abuse who was admitted 07/28/14 with a chief complaint of a 2 day history of right upper quadrant abdominal pain. Patient also reported having ongoing dyspnea on exertion for several months prior to the admission and unintentional weight loss. Evaluation on the admission included CT abdomen which showed a possible colon cancer near the hepatic flexure with surrounding lymphadenopathy. Patient underwent colonoscopy 07/29/2014 followed by surgical resection of the colon cancer 07/30/2014. Postoperative course has been complicated with bile leak and healthcare associated pneumonia. Additionally, JP on 08/05/14 with bile color and has increased in amount drainge so ERCP done on 08/06/14 with stent in CBD. Patient then developed fever of 102.27F on 08/08/2014. The patient was started on empiric antibiotics and cultures were performed. He continued to have low-grade fevers. As result, repeat CT of the abdomen and pelvis was performed on  08/11/14. He has subsequently stabilized.  Porta-cath placed prior to discharge in anticipation of chemotherapy.  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  - Status post exploratory laparotomy, extended right colectomy, biopsy of peritoneal nodules and cholecystectomy on 07/30/14. 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 - During this hospitalization, patient has received 4 units of PRBC blood transfusion. He reports no bleeding since. - Hemoglobin 7.9 on discharge.  - Patient will follow-up with oncology per scheduled appointment on discharge. -  Seen by Dr. Benay Spice in consultation with recommendations for PET after d/c, placement of port-a-cath. - Continue oral iron per recommendations.  Active Problems: Depression - Admits to depressed mood. Wife reported irritability, change in personality. - Remeron started 08/13/14. Can titrate as outpatient. Some improvement in appetite.  Malnutrition / Poor appetite - Dietician consult for assessment of nutritional status. - Remeron for appetite stimulation added 08/13/14. - Continue Ensure TID as tolerated.  New Fever-->HAP/HCAP vs neoplastic fever/Acute respiratory failure -Blood cultures x 2 sets--neg; UA--no pyuria; 08/08/14--Cdiff-- NEG. No elevation of lactic acid. -Completed course of Imipenem and vancomycin 08/14/14.  - No new fevers.  Postoperative bile leak - HIDA done 08/02/14 showed bile leak.  - 08/06/14 ERCP (Dr. Boykin Peek leak from biliary system-->stent placed. - Seems to be resolving.  Essential hypertension  - BP at goal, continue atenolol.  Hyperlipidemia  - Continue statin.  Anxiety  -  Continue Xanax.   Tobacco abuse/COPD -Continue nicotine patch.  Hypokalemia  - Secondary to GI  losses. Supplemented.  Hx of ETOH use quit 4 years ago - Not active problem at present.  Medical Consultants:    Dr. Lucio Edward, GI.  Dr. Jackolyn Confer, Surgery  Dr. Julieanne Manson, Oncology   Discharge Exam:   Filed Vitals:   08/16/14 1609  BP: 136/71  Pulse: 65  Temp: 98.1 F (36.7 C)  Resp: 20   Filed Vitals:   08/16/14 1445 08/16/14 1545 08/16/14 1609 08/16/14 1619  BP: 130/73  136/71   Pulse: 68  65   Temp: 98.1 F (36.7 C) 98.3 F (36.8 C) 98.1 F (36.7 C)   TempSrc:   Oral   Resp:   20   Height:      Weight:      SpO2: 99%  95% 93%    Gen:  NAD Cardiovascular:  RRR, No M/R/G Respiratory: Lungs CTAB Gastrointestinal: Abdomen soft, NT/ND with normal active bowel sounds. Extremities: No C/E/C   The results of significant diagnostics from this hospitalization (including imaging, microbiology, ancillary and laboratory) are listed below for reference.     Procedures and Diagnostic Studies:    Ct Abdomen Pelvis W Contrast 07/28/14: 1. Markedly abnormal hepatic flexure with wall thickening along a 10-12 cm length but extensive surrounding mesenteric nodularity, and increased regional lymph nodes. The appearance is atypical for acute colitis and highly suspicious for adenocarcinoma. GI consultation for followup colonoscopy recommended. 2. Small volume perihepatic fluid. Multiple up to 14 mm indeterminate low-density lesions in the liver. Several prominent but indeterminate retroperitoneal lymph nodes in the upper abdomen. 3. Abnormal fat stranding/nodularity continues to the gallbladder fossa, but the gallbladder does not appear inflamed.   Colonoscopy 07/29/14: 1. Large circumferential obstructing friable mass at the hepaticflexure; multiple biopsies performed2. Mild diverticulosis was noted in the sigmoid colon anddescending colon.  Laparoscopy converted to exploratory laparotomy, extended right colectomy, biopsy of peritoneal nodules on diaphragm with  closure of diaphragm, cholecystectomy 07/30/14: Done by Dr. Zella Richer.  Dg Chest Port 1 View 07/30/2014: Right IJ venous catheter terminates in the mid SVC. No pneumothorax.   Dg Chest Port 1 View 08/01/2014: Increased bilateral upper lobe and right middle lobe opacities are noted most consistent with pneumonia or possibly edema. Followup radiographs are recommended.   Nm Hepatobiliary Including Gb 08/02/2014: 1. Abnormal radiotracer collection adjacent to the inferior margin of the left hepatic lobe most concerning for a bile leak.   Dg Ercp 08/06/2014: Biliary stent placement. These images were submitted for radiologic interpretation only. Please see the procedural report for the amount of contrast and the fluoroscopy time utilized.   Dg Chest 2 View 08/08/2014: Decreased bilateral interstitial and airspace opacities which may represent improved edema or infection. Bibasilar opacities again noted, improved on the right. Small bilateral pleural effusions again identified.   Ct Abdomen Pelvis W Contrast 08/11/2014: 1. Inflammatory stranding at the celiac axis and root of the mesentery, favor due to acute pancreatitis. No pancreatic necrosis or organized fluid collection identified. CBD stent in place. 2. Postoperative changes from right hemicolectomy and cholecystectomy for treatment of hepatic flexure poorly differentiated adenocarcinoma. No evidence of bowel obstruction. Postoperative drain in place with small volume perihepatic and small to moderate volume pelvic free fluid which appears simple in nature. 3. Moderate pleural effusions with new lower lobe consolidation and bilateral middle lobe patchy opacity suspicious for pneumonia.    Labs:   Basic Metabolic Panel:  Recent  Labs Lab 08/10/14 0435 08/11/14 0440 08/12/14 0545 08/15/14 0539 08/16/14 0512  NA 138 137 141 141 143  K 3.4* 3.0* 4.0 3.3* 3.9  CL 108 102 107 104 102  CO2 26 26 26 29 30   GLUCOSE 88 93 76 97 96  BUN 14 11 8   <5* 5*  CREATININE 0.49* 0.46* 0.46* 0.46* 0.50  CALCIUM 7.4* 7.5* 7.7* 7.8* 8.3*  MG 2.1 2.2  --   --   --    GFR Estimated Creatinine Clearance: 98.2 mL/min (by C-G formula based on Cr of 0.5). Liver Function Tests:  Recent Labs Lab 08/10/14 0435 08/11/14 0440 08/12/14 0545  AST 19 16 22   ALT 23 15 18   ALKPHOS 139* 124* 148*  BILITOT 0.7 0.1* 0.6  PROT 5.4* 5.2* 5.3*  ALBUMIN 1.7* 1.7* 1.8*    Recent Labs Lab 08/11/14 0440 08/12/14 0545  LIPASE 41 27  AMYLASE 45  --    Coagulation profile  Recent Labs Lab 08/16/14 0512  INR 1.29    CBC:  Recent Labs Lab 08/11/14 0440 08/12/14 0545 08/13/14 0523 08/15/14 0539 08/16/14 0512  WBC 9.1 10.6* 15.1* 9.6 8.1  HGB 7.5* 7.6* 7.8* 7.4* 7.9*  HCT 24.9* 25.5* 26.5* 24.8* 26.7*  MCV 79.3 79.4 79.8 78.7 78.5  PLT 392 473* 579* 610* 672*   Microbiology Recent Results (from the past 240 hour(s))  Clostridium Difficile by PCR     Status: None   Collection Time: 08/08/14  6:06 AM  Result Value Ref Range Status   C difficile by pcr NEGATIVE NEGATIVE Final  Culture, blood (routine x 2)     Status: None   Collection Time: 08/08/14  1:47 PM  Result Value Ref Range Status   Specimen Description BLOOD LEFT ASSIST CONTROL  Final   Special Requests BOTTLES DRAWN AEROBIC ONLY 2CC  Final   Culture   Final    NO GROWTH 5 DAYS Performed at [facility]    Report Status 08/14/2014 FINAL  Final  Culture, blood (routine x 2)     Status: None   Collection Time: 08/08/14  2:30 PM  Result Value Ref Range Status   Specimen Description BLOOD LEFT ARM  Final   Special Requests BOTTLES DRAWN AEROBIC ONLY 2CC  Final   Culture   Final    NO GROWTH 5 DAYS Performed at [facility]    Report Status 08/14/2014 FINAL  Final  Urine culture     Status: None   Collection Time: 08/08/14  5:31 PM  Result Value Ref Range Status   Specimen Description URINE, CLEAN CATCH  Final   Special Requests NONE  Final   Colony  Count NO GROWTH Performed at [facility]   Final   Culture NO GROWTH Performed at [facility]   Final   Report Status 08/09/2014 FINAL  Final     Discharge Instructions:   Discharge Instructions    Call MD for:  extreme fatigue    Complete by:  As directed      Call MD for:  persistant nausea and vomiting    Complete by:  As directed      Call MD for:  severe uncontrolled pain    Complete by:  As directed      Call MD for:  temperature >100.4    Complete by:  As directed      Diet general    Complete by:  As directed  Discharge instructions    Complete by:  As directed   You were cared for by Dr. Jacquelynn Cree  (a hospitalist) during your hospital stay. If you have any questions about your discharge medications or the care you received while you were in the hospital after you are discharged, you can call the unit and ask to speak with the hospitalist on call if the hospitalist that took care of you is not available. Once you are discharged, your primary care physician will handle any further medical issues. Please note that NO REFILLS for any discharge medications will be authorized once you are discharged, as it is imperative that you return to your primary care physician (or establish a relationship with a primary care physician if you do not have one) for your aftercare needs so that they can reassess your need for medications and monitor your lab values.  Any outstanding tests can be reviewed by your PCP at your follow up visit.  It is also important to review any medicine changes with your PCP.  Please bring these d/c instructions with you to your next visit so your physician can review these changes with you.  If you do not have a primary care physician, you can call [phone] for a physician referral.  It is highly recommended that you obtain a PCP for hospital follow up.     Increase activity slowly    Complete by:  As directed             Medication  List    STOP taking these medications        predniSONE 50 MG tablet  Commonly known as:  DELTASONE      TAKE these medications        ALPRAZolam 1 MG tablet  Commonly known as:  XANAX  Take 1 mg by mouth 3 (three) times daily.     aspirin 81 MG tablet  Take 81 mg by mouth daily.     atenolol 100 MG tablet  Commonly known as:  TENORMIN  Take 100 mg by mouth daily.     feeding supplement (ENSURE) Pudg  Take 1 Container by mouth 3 (three) times daily between meals.     ferrous sulfate 325 (65 FE) MG tablet  Take 325 mg by mouth daily with breakfast.     lovastatin 40 MG tablet  Commonly known as:  MEVACOR  Take 40 mg by mouth at bedtime.     mirtazapine 15 MG tablet  Commonly known as:  REMERON  Take 0.5-1 tablets (7.5-15 mg total) by mouth at bedtime.     multivitamin-iron-minerals-folic acid chewable tablet  Chew 1 tablet by mouth daily.     senna 8.6 MG Tabs tablet  Commonly known as:  SENOKOT  Take 1 tablet (8.6 mg total) by mouth daily as needed for mild constipation.     traMADol 50 MG tablet  Commonly known as:  ULTRAM  Take 1 tablet (50 mg total) by mouth every 6 (six) hours as needed for severe pain.           Follow-up Information    Follow up with Norberto Sorenson T. Fuller Plan, MD On 09/08/2014.   Specialty:  Gastroenterology   Why:  Appt at 9:45, be there 9:30.   Contact information:   [address] [place] [address] [phone]       Follow up with [name], MD. Go on 08/25/2014.   Specialty:  Family Medicine   Why:  appt  at 1:30pm   Contact information:   [address] [place] [address] [phone]       Follow up with [name], MD. Schedule an appointment as soon as possible for a visit in 2 weeks.   Specialty:  General Surgery   Contact information:   [address] [place] [address] [phone]       Follow up with Volanda Napoleon, MD. Go on 08/20/2014.   Specialty:  Oncology   Why:  3:15  PM, 3rd Floor, [address]   Contact information:   [address] [place] [address] [phone]       Follow up with [name], MD.   Specialty:  Family Medicine   Contact information:   [place] [address] [phone]        Time coordinating discharge: 35 minutes.  Signed:  Greely Atiyeh  Pager [phone] [facility] 08/16/2014, 4:24 PM

## 2014-08-16 NOTE — Progress Notes (Signed)
Progress Note   Donald Fry VHQ:[id number] DOB: 1955/09/20 DOA: 07/28/2014 PCP: [name], MD   Brief Narrative:   Donald Fry is an 59 y.o. male with a PMH of hyperlipidemia, anemia, hypertension and tobacco abuse who was admitted 07/28/14 with a chief complaint of a 2 day history of right upper quadrant abdominal pain. Patient also reported having ongoing dyspnea on exertion for several months prior to the admission and unintentional weight loss. Evaluation on the admission included CT abdomen which showed a possible colon cancer near the hepatic flexure with surrounding lymphadenopathy. Patient underwent colonoscopy 07/29/2014 followed by surgical resection of the colon cancer 07/30/2014. Postoperative course has been complicated with bile leak and healthcare associated pneumonia. Additionally, JP on 08/05/14 with bile color and has increased in amount drainge so ERCP done on 08/06/14 with stent in CBD. Patient then developed fever of 102.41F on 08/08/2014. The patient was started on empiric antibiotics and cultures were performed. He continued to have low-grade fevers. As result, repeat CT of the abdomen and pelvis was performed on 08/11/14. In addition, the patient has increased drainage from [place] again on 08/10/14.  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  - Status post exploratory laparotomy, extended right colectomy, biopsy of peritoneal nodules and cholecystectomy on 07/30/14. 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 - During this hospitalization, patient has received 4 units of PRBC blood transfusion. He reports no bleeding since. - Hemoglobin 7.5 on 08/09/2014.  - Hgb has largely remained stable; Baseline Hgb 7-8. - Patient will follow-up with oncology per scheduled  appointment on discharge. - 08/10/14--appreciate Dr. Benay Spice consult-->PET after d/c, place port-a-cath (order placed for IR consult--? To be done today) - Continue oral iron per recommendations.  Active Problems: Depression - Admits to depressed mood.  Wife reports irritability, change in personality. - Remeron started 08/13/14.  Can titrate as outpatient.  Some improvement in appetite.  Malnutrition / Poor appetite - Dietician consult for assessment of nutritional status. - Remeron for appetite stimulation added 08/13/14. - Continue Ensure TID as tolerated.  New Fever-->HAP/HCAP vs neoplastic fever/Acute respiratory failure -Blood cultures x 2 sets--neg; UA--no pyuria; 08/08/14--Cdiff-- NEG.  No elevation of lactic acid. -Completed course of Imipenem and vancomycin 08/14/14.   - No new fevers.  Postoperative bile leak - HIDA done 08/02/14 showed bile leak.  - 08/06/14 ERCP (Dr. Boykin Peek leak from biliary system-->stent placed. - Seems to be resolving.  Essential hypertension  - BP at goal, continue atenolol.  Hyperlipidemia  - Continue statin.  Anxiety  - Continue Xanax.    Tobacco abuse/COPD -Continue nicotine patch.  Hypokalemia  - Secondary to GI losses. Supplemented.  Hx of ETOH use quit 4 years ago - Not active problem at present.  DVT Prophylaxis  - Heparin subcutaneous ordered.  Code Status: Full. Family Communication: Wife updated at the bedside. Disposition Plan: Home when stable.   IV Access:    Peripheral IV   Procedures and diagnostic studies:   Ct Abdomen Pelvis W Contrast 07/28/14: 1. Markedly abnormal hepatic flexure with wall thickening along a 10-12 cm length but extensive surrounding mesenteric nodularity, and increased regional lymph nodes. The appearance is atypical for acute colitis and highly suspicious for adenocarcinoma. GI consultation for followup colonoscopy recommended. 2. Small volume perihepatic fluid. Multiple up to  14 mm  indeterminate low-density lesions in the liver. Several prominent but indeterminate retroperitoneal lymph nodes in the upper abdomen. 3. Abnormal fat stranding/nodularity continues to the gallbladder fossa, but the gallbladder does not appear inflamed.   Colonoscopy 07/29/14: 1. Large circumferential obstructing friable mass at the hepaticflexure; multiple biopsies performed2. Mild diverticulosis was noted in the sigmoid colon anddescending colon.  Laparoscopy converted to exploratory laparotomy, extended right colectomy, biopsy of peritoneal nodules on diaphragm with closure of diaphragm, cholecystectomy 07/30/14: Done by Dr. Zella Richer.  Dg Chest Port 1 View 07/30/2014: Right IJ venous catheter terminates in the mid SVC. No pneumothorax.   Dg Chest Port 1 View 08/01/2014: Increased bilateral upper lobe and right middle lobe opacities are noted most consistent with pneumonia or possibly edema. Followup radiographs are recommended.   Nm Hepatobiliary Including Gb 08/02/2014: 1. Abnormal radiotracer collection adjacent to the inferior margin of the left hepatic lobe most concerning for a bile leak.   Dg Ercp 08/06/2014: Biliary stent placement.  These images were submitted for radiologic interpretation only. Please see the procedural report for the amount of contrast and the fluoroscopy time utilized.     Dg Chest 2 View 08/08/2014: Decreased bilateral interstitial and airspace opacities which may represent improved edema or infection.  Bibasilar opacities again noted, improved on the right.  Small bilateral pleural effusions again identified.     Ct Abdomen Pelvis W Contrast 08/11/2014: 1. Inflammatory stranding at the celiac axis and root of the mesentery, favor due to acute pancreatitis. No pancreatic necrosis or organized fluid collection identified. CBD stent in place. 2. Postoperative changes from right hemicolectomy and cholecystectomy for treatment of hepatic flexure poorly differentiated  adenocarcinoma. No evidence of bowel obstruction. Postoperative drain in place with small volume perihepatic and small to moderate volume pelvic free fluid which appears simple in nature. 3. Moderate pleural effusions with new lower lobe consolidation and bilateral middle lobe patchy opacity suspicious for pneumonia.     Medical Consultants:    Dr. Lucio Edward, GI.  Dr. Jackolyn Confer, Surgery  Anti-Infectives:    Zosyn 07/29/14---> 07/30/14, resumed 08/01/14--->08/08/14  Imipenem 08/08/14--->08/14/14  Vancomycin 08/10/14--->08/14/14  Subjective:   Donald Fry denies any pain. Appetite still only fair at best. Bowels moved this morning.   Objective:    Filed Vitals:   08/15/14 1124 08/15/14 1324 08/15/14 2050 08/16/14 0530  BP:  142/69 144/66 129/71  Pulse:  70 84 70  Temp:  98.3 F (36.8 C) 98.1 F (36.7 C) 98.2 F (36.8 C)  TempSrc:  Oral Oral Oral  Resp:  17 19 19   Height:      Weight:      SpO2: 94% 94% 91% 92%    Intake/Output Summary (Last 24 hours) at 08/16/14 0755 Last data filed at 08/16/14 0600  Gross per 24 hour  Intake    840 ml  Output    600 ml  Net    240 ml    Exam: Gen:  NAD Psych: Depressed affect Cardiovascular:  RRR, No M/R/G Respiratory:  Lungs CTAB Gastrointestinal:  Abdomen soft, NT/ND, + BS Extremities:  No C/E/C   Data Reviewed:    Labs: Basic Metabolic Panel:  Recent Labs Lab 08/10/14 0435 08/11/14 0440 08/12/14 0545 08/15/14 0539 08/16/14 0512  NA 138 137 141 141 143  K 3.4* 3.0* 4.0 3.3* 3.9  CL 108 102 107 104 102  CO2 26 26 26 29 30   GLUCOSE 88 93 76 97 96  BUN 14 11 8  <5*  5*  CREATININE 0.49* 0.46* 0.46* 0.46* 0.50  CALCIUM 7.4* 7.5* 7.7* 7.8* 8.3*  MG 2.1 2.2  --   --   --    GFR Estimated Creatinine Clearance: 98.2 mL/min (by C-G formula based on Cr of 0.5). Liver Function Tests:  Recent Labs Lab 08/10/14 0435 08/11/14 0440 08/12/14 0545  AST 19 16 22   ALT 23 15 18   ALKPHOS 139* 124* 148*  BILITOT  0.7 0.1* 0.6  PROT 5.4* 5.2* 5.3*  ALBUMIN 1.7* 1.7* 1.8*    Recent Labs Lab 08/11/14 0440 08/12/14 0545  LIPASE 41 27  AMYLASE 45  --    CBC:  Recent Labs Lab 08/11/14 0440 08/12/14 0545 08/13/14 0523 08/15/14 0539 08/16/14 0512  WBC 9.1 10.6* 15.1* 9.6 8.1  HGB 7.5* 7.6* 7.8* 7.4* 7.9*  HCT 24.9* 25.5* 26.5* 24.8* 26.7*  MCV 79.3 79.4 79.8 78.7 78.5  PLT 392 473* 579* 610* 672*   Sepsis Labs:  Recent Labs Lab 08/12/14 0545 08/13/14 0523 08/15/14 0539 08/16/14 0512  WBC 10.6* 15.1* 9.6 8.1   Microbiology Recent Results (from the past 240 hour(s))  Clostridium Difficile by PCR     Status: None   Collection Time: 08/08/14  6:06 AM  Result Value Ref Range Status   C difficile by pcr NEGATIVE NEGATIVE Final  Culture, blood (routine x 2)     Status: None   Collection Time: 08/08/14  1:47 PM  Result Value Ref Range Status   Specimen Description BLOOD LEFT ASSIST CONTROL  Final   Special Requests BOTTLES DRAWN AEROBIC ONLY 2CC  Final   Culture   Final    NO GROWTH 5 DAYS Performed at [facility]    Report Status 08/14/2014 FINAL  Final  Culture, blood (routine x 2)     Status: None   Collection Time: 08/08/14  2:30 PM  Result Value Ref Range Status   Specimen Description BLOOD LEFT ARM  Final   Special Requests BOTTLES DRAWN AEROBIC ONLY 2CC  Final   Culture   Final    NO GROWTH 5 DAYS Performed at [facility]    Report Status 08/14/2014 FINAL  Final  Urine culture     Status: None   Collection Time: 08/08/14  5:31 PM  Result Value Ref Range Status   Specimen Description URINE, CLEAN CATCH  Final   Special Requests NONE  Final   Colony Count NO GROWTH Performed at [facility]   Final   Culture NO GROWTH Performed at [facility]   Final   Report Status 08/09/2014 FINAL  Final     Medications:   . ALPRAZolam  1 mg Oral TID  . atenolol  100 mg Oral Daily  .  ceFAZolin (ANCEF) IV  2 g Intravenous On Call  .  feeding supplement (ENSURE)  1 Container Oral TID BM  . feeding supplement (RESOURCE BREEZE)  1 Container Oral TID BM  . heparin subcutaneous  5,000 Units Subcutaneous 3 times per day  . iron polysaccharides  150 mg Oral Daily  . mirtazapine  7.5 mg Oral QHS  . multivitamin with minerals  1 tablet Oral Daily  . pantoprazole  40 mg Oral QHS  . potassium chloride  20 mEq Oral BID  . pravastatin  40 mg Oral q1800   Continuous Infusions: . 0.9 % NaCl with KCl 40 mEq / L 10 mL/hr (08/14/14 1720)    Time spent: 25 minutes.   LOS: 19 days  [place] [facility] Pager [phone]. If unable to reach me by pager, please call my cell phone at [phone].  *Please refer to [email or web address], password [email or web address] to get updated schedule on who will round on this patient, as hospitalists switch teams weekly. If 7PM-7AM, please contact night-coverage at [email or web address], password [email or web address] for any overnight needs.  08/16/2014, 7:55 AM

## 2014-08-17 ENCOUNTER — Encounter (HOSPITAL_COMMUNITY): Payer: Self-pay | Admitting: General Surgery

## 2014-08-20 ENCOUNTER — Ambulatory Visit (HOSPITAL_BASED_OUTPATIENT_CLINIC_OR_DEPARTMENT_OTHER): Payer: Medicaid Other | Admitting: Hematology & Oncology

## 2014-08-20 ENCOUNTER — Ambulatory Visit: Payer: Self-pay | Admitting: Hematology & Oncology

## 2014-08-20 ENCOUNTER — Other Ambulatory Visit (HOSPITAL_BASED_OUTPATIENT_CLINIC_OR_DEPARTMENT_OTHER): Payer: Medicaid Other | Admitting: Lab

## 2014-08-20 ENCOUNTER — Other Ambulatory Visit: Payer: Self-pay | Admitting: Lab

## 2014-08-20 ENCOUNTER — Encounter: Payer: Self-pay | Admitting: Hematology & Oncology

## 2014-08-20 VITALS — BP 147/69 | HR 55 | Temp 98.6°F | Resp 18 | Ht 69.0 in | Wt 145.0 lb

## 2014-08-20 DIAGNOSIS — C184 Malignant neoplasm of transverse colon: Secondary | ICD-10-CM

## 2014-08-20 DIAGNOSIS — G4701 Insomnia due to medical condition: Secondary | ICD-10-CM

## 2014-08-20 DIAGNOSIS — C189 Malignant neoplasm of colon, unspecified: Secondary | ICD-10-CM | POA: Insufficient documentation

## 2014-08-20 DIAGNOSIS — R64 Cachexia: Secondary | ICD-10-CM

## 2014-08-20 LAB — COMPREHENSIVE METABOLIC PANEL
ALK PHOS: 110 U/L (ref 39–117)
ALT: 10 U/L (ref 0–53)
AST: 15 U/L (ref 0–37)
Albumin: 2.8 g/dL — ABNORMAL LOW (ref 3.5–5.2)
BILIRUBIN TOTAL: 0.4 mg/dL (ref 0.2–1.2)
BUN: 6 mg/dL (ref 6–23)
CALCIUM: 8.7 mg/dL (ref 8.4–10.5)
CO2: 29 meq/L (ref 19–32)
CREATININE: 0.58 mg/dL (ref 0.50–1.35)
Chloride: 101 mEq/L (ref 96–112)
Glucose, Bld: 101 mg/dL — ABNORMAL HIGH (ref 70–99)
Potassium: 3.9 mEq/L (ref 3.5–5.3)
Sodium: 140 mEq/L (ref 135–145)
Total Protein: 6.4 g/dL (ref 6.0–8.3)

## 2014-08-20 LAB — PREALBUMIN: Prealbumin: 15.2 mg/dL — ABNORMAL LOW (ref 17.0–34.0)

## 2014-08-20 LAB — CBC WITH DIFFERENTIAL (CANCER CENTER ONLY)
BASO#: 0.1 10*3/uL (ref 0.0–0.2)
BASO%: 0.7 % (ref 0.0–2.0)
EOS%: 5.4 % (ref 0.0–7.0)
Eosinophils Absolute: 0.5 10*3/uL (ref 0.0–0.5)
HEMATOCRIT: 32.2 % — AB (ref 38.7–49.9)
HEMOGLOBIN: 9.6 g/dL — AB (ref 13.0–17.1)
LYMPH#: 1.4 10*3/uL (ref 0.9–3.3)
LYMPH%: 16.1 % (ref 14.0–48.0)
MCH: 23.2 pg — AB (ref 28.0–33.4)
MCHC: 29.8 g/dL — ABNORMAL LOW (ref 32.0–35.9)
MCV: 78 fL — ABNORMAL LOW (ref 82–98)
MONO#: 1.1 10*3/uL — ABNORMAL HIGH (ref 0.1–0.9)
MONO%: 12 % (ref 0.0–13.0)
NEUT#: 5.9 10*3/uL (ref 1.5–6.5)
NEUT%: 65.8 % (ref 40.0–80.0)
PLATELETS: 543 10*3/uL — AB (ref 145–400)
RBC: 4.14 10*6/uL — ABNORMAL LOW (ref 4.20–5.70)
RDW: 20.2 % — ABNORMAL HIGH (ref 11.1–15.7)
WBC: 8.9 10*3/uL (ref 4.0–10.0)

## 2014-08-20 LAB — CEA: CEA: 1.4 ng/mL (ref 0.0–5.0)

## 2014-08-20 MED ORDER — DRONABINOL 5 MG PO CAPS: 5.0000 mg | ORAL_CAPSULE | Freq: Two times a day (BID) | ORAL | Status: DC

## 2014-08-20 MED ORDER — TEMAZEPAM 15 MG PO CAPS: 15.0000 mg | ORAL_CAPSULE | Freq: Every evening | ORAL | Status: DC | PRN

## 2014-08-20 NOTE — Progress Notes (Signed)
Hematology and Oncology Follow Up Visit  Donald Fry [id number] Feb 05, 1956 59 y.o. 08/20/2014   Principle Diagnosis:   Metastatic colon cancer- KRAS wild type  Current Therapy:    Status post resection of primary tumor with biopsy of diaphragmatic met.     Interim History:  Donald Fry is in for his first office visit. He lives close to our office so Donald Fry was very kind and let him come see [place]. I actually have take care of some family members probably about 15 years ago.  He was diagnosed with metastatic colon cancer. This was back in February. He presented with abdominal pain, anemia and was found having positive stools. He underwent a CT scan. It showed that he had a massive the hepatic flexure. There was enlarged regional lymph nodes. He had a indeterminate 1.4 cm lesion in the liver. He has retroperitoneal lymph nodes.  Unfortunately, there is no preop CEA done. His postop CEA on March 8 was 3.1.  He did undergo surgical resection ([id number]) showed a poorly differentiated adenocarcinoma with signet rings cell features. This infiltrated into the skeletal muscle. This was a diaphragmatic biopsy. The actual primary tumor measured 10 cm. It was poorly differentiated. 11/17 lymph nodes were positive. There are multiple satellite tumor nodules that were noted. Of note, the appendix and gallbladder were taken out.  The tumor was found to be KRAS wild-type. In addition, the tumor was microsatellite stable.  He was in the hospital for almost 3 weeks. He just had a slow recovery. He's lost some weight but has gained some of this back.  He comes in a wheelchair. He is able to get around okay. He does have a cane.  He does not have much of an appetite. I went ahead and gave him a prescription for Marinol. I also gave prescription for Restoril to help him sleep.  He's not hurting area and he is not having diarrhea. There's been no leg swelling. He's had no cough.  Prior to him coming  into the hospital, he is working pretty much full-time. 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  Currently, I would say that his performance status is ECOG 1-2.         Medications:  Current outpatient prescriptions:  .  ALPRAZolam (XANAX) 1 MG tablet, Take 1 mg by mouth 4 (four) times daily as needed. , Disp: , Rfl:  .  aspirin 81 MG tablet, Take 81 mg by mouth daily., Disp: , Rfl:  .  atenolol (TENORMIN) 100 MG tablet, Take 100 mg by mouth daily., Disp: , Rfl:  .  feeding supplement, ENSURE, (ENSURE) PUDG, Take 1 Container by mouth 3 (three) times daily between meals., Disp: 12 Can, Rfl: 11 .  lovastatin (MEVACOR) 40 MG tablet, Take 40 mg by mouth at bedtime., Disp: , Rfl:  .  mirtazapine (REMERON) 15 MG tablet, Take 0.5-1 tablets (7.5-15 mg total) by mouth at bedtime., Disp: 30 tablet, Rfl: 3 .  multivitamin-iron-minerals-folic acid (CENTRUM) chewable tablet, Chew 1 tablet by mouth daily., Disp: 30 tablet, Rfl: 0 .  senna (SENOKOT) 8.6 MG TABS tablet, Take 1 tablet (8.6 mg total) by mouth daily as needed for mild constipation., Disp: 120 each, Rfl: 0 .  traMADol (ULTRAM) 50 MG tablet, Take 1 tablet (50 mg total) by mouth every 6 (six) hours as needed for severe pain., Disp: 15 tablet, Rfl: 0 .  dronabinol (MARINOL) 5 MG capsule, Take 1 capsule (5 mg total) by  mouth 2 (two) times daily before a meal., Disp: 60 capsule, Rfl: 2 .  temazepam (RESTORIL) 15 MG capsule, Take 1 capsule (15 mg total) by mouth at bedtime as needed for sleep., Disp: 30 capsule, Rfl: 2  Allergies: No Known Allergies  Past Medical History, Surgical history, Social history, and Family History were reviewed and updated.  Review of Systems: As above  Physical Exam:  height is 5' 9"  (1.753 m) and weight is 145 lb (65.772 kg). His oral temperature is 98.6 F (37 C). His blood pressure is 147/69 and his pulse is 55. His respiration is 18.   Wt Readings from Last 3 Encounters:    08/20/14 145 lb (65.772 kg)  08/15/14 157 lb 4.8 oz (71.351 kg)  07/25/14 165 lb (74.844 kg)     Thin white gentleman in no obvious distress. Head and neck exam shows no ocular or oral lesions. He has no palpable cervical or supraclavicular lymph nodes. Lungs are clear. Cardiac exam regular rate and rhythm with no murmurs, rubs or bruits. Abdomen is soft. He has good bowel sounds. There is no fluid wave. There is no palpable abdominal mass. He has a well healing laparotomy scar. There is no palpable hepatosplenomegaly. Back exam shows no tenderness over the spine, ribs or hips. Extremities shows no clubbing, cyanosis or edema. Skin exam is slightly dry. Neurological exam is nonfocal.  Lab Results  Component Value Date   WBC 8.9 08/20/2014   HGB 9.6* 08/20/2014   HCT 32.2* 08/20/2014   MCV 78* 08/20/2014   PLT 543* 08/20/2014     Chemistry      Component Value Date/Time   NA 143 08/16/2014 0512   K 3.9 08/16/2014 0512   CL 102 08/16/2014 0512   CO2 30 08/16/2014 0512   BUN 5* 08/16/2014 0512   CREATININE 0.50 08/16/2014 0512      Component Value Date/Time   CALCIUM 8.3* 08/16/2014 0512   ALKPHOS 148* 08/12/2014 0545   AST 22 08/12/2014 0545   ALT 18 08/12/2014 0545   BILITOT 0.6 08/12/2014 0545         Impression and Plan: Donald Fry is 59 year old white gentleman with stage IV colon cancer. This is a poorly differentiated colon cancer. He has fairly extensive intra-abdominal disease.  He already has a Port-A-Cath in place.  I think that he would be a good candidate for systemic chemotherapy. I would go ahead and try to treat him with FOLFOXIRI. I probably would leave off Avastin right now given that he just had surgery and I think that he would be at substantial risk for abdominal perforation.  I spent about an hour or so with Donald Fry and his sisters. I explained to them that what he has is treatable. I cannot say that is curable. However, his faith is very strong. We  certainly have to know that God is in charge and God will take care of him.  I went over the side effects of chemotherapy. I explained this to him at length. I gave him information sheets about treatment.  He will need to have the chemotherapy education class.  I suspect that he probably is iron deficient. I will give him IV iron with treatment.  I also suspect that he might be vitamin B-12 deficient. I'll give him 1 dose of vitamin B-12.  He wants to try to have treatment. We will try to get started next week.  I answered all their questions.  I will plan to  see him back for his second cycle of treatment. I will plan for a follow-up scan after his fourth cycle of treatment.     Volanda Napoleon, MD 3/18/20166:01 PM

## 2014-08-23 ENCOUNTER — Telehealth: Payer: Self-pay | Admitting: Hematology & Oncology

## 2014-08-23 ENCOUNTER — Other Ambulatory Visit: Payer: Self-pay | Admitting: *Deleted

## 2014-08-23 DIAGNOSIS — C189 Malignant neoplasm of colon, unspecified: Secondary | ICD-10-CM

## 2014-08-23 MED ORDER — PREDNISONE 20 MG PO TABS: 20.0000 mg | ORAL_TABLET | Freq: Every day | ORAL | Status: DC

## 2014-08-23 NOTE — Telephone Encounter (Signed)
Pt aware of chemo edu and tx schedule for this week.

## 2014-08-23 NOTE — Telephone Encounter (Signed)
Called stating that marinol was too expensive and wanted something else. Spoke to Dr Marin Olp. Will send in prescription for prednisone. Spoke to wife who is in agreement with plan.

## 2014-08-24 ENCOUNTER — Encounter: Payer: Self-pay | Admitting: Nurse Practitioner

## 2014-08-24 ENCOUNTER — Encounter: Payer: Self-pay | Admitting: *Deleted

## 2014-08-24 ENCOUNTER — Other Ambulatory Visit: Payer: Self-pay

## 2014-08-24 DIAGNOSIS — C189 Malignant neoplasm of colon, unspecified: Secondary | ICD-10-CM

## 2014-08-24 MED ORDER — LIDOCAINE-PRILOCAINE 2.5-2.5 % EX CREA: TOPICAL_CREAM | CUTANEOUS | Status: DC

## 2014-08-24 MED ORDER — DEXAMETHASONE 4 MG PO TABS: 8.0000 mg | ORAL_TABLET | Freq: Two times a day (BID) | ORAL | Status: DC

## 2014-08-24 MED ORDER — LORAZEPAM 0.5 MG PO TABS: 0.5000 mg | ORAL_TABLET | Freq: Four times a day (QID) | ORAL | Status: DC | PRN

## 2014-08-24 MED ORDER — LOPERAMIDE HCL 2 MG PO TABS: ORAL_TABLET | ORAL | Status: DC

## 2014-08-24 MED ORDER — ONDANSETRON HCL 8 MG PO TABS: 8.0000 mg | ORAL_TABLET | Freq: Two times a day (BID) | ORAL | Status: DC

## 2014-08-24 MED ORDER — PROCHLORPERAZINE MALEATE 10 MG PO TABS: 10.0000 mg | ORAL_TABLET | Freq: Four times a day (QID) | ORAL | Status: DC | PRN

## 2014-08-25 ENCOUNTER — Encounter: Payer: Self-pay | Admitting: Hematology & Oncology

## 2014-08-25 ENCOUNTER — Ambulatory Visit (HOSPITAL_BASED_OUTPATIENT_CLINIC_OR_DEPARTMENT_OTHER): Payer: Medicaid Other

## 2014-08-25 ENCOUNTER — Telehealth: Payer: Self-pay | Admitting: Hematology & Oncology

## 2014-08-25 DIAGNOSIS — C183 Malignant neoplasm of hepatic flexure: Secondary | ICD-10-CM

## 2014-08-25 DIAGNOSIS — C189 Malignant neoplasm of colon, unspecified: Secondary | ICD-10-CM

## 2014-08-25 DIAGNOSIS — C772 Secondary and unspecified malignant neoplasm of intra-abdominal lymph nodes: Secondary | ICD-10-CM | POA: Diagnosis not present

## 2014-08-25 DIAGNOSIS — Z5111 Encounter for antineoplastic chemotherapy: Secondary | ICD-10-CM

## 2014-08-25 MED ORDER — OXALIPLATIN CHEMO INJECTION 100 MG/20ML
68.0000 mg/m2 | Freq: Once | INTRAVENOUS | Status: AC
  Administered 2014-08-25: 120 mg via INTRAVENOUS
  Filled 2014-08-25: qty 24

## 2014-08-25 MED ORDER — ATROPINE SULFATE 1 MG/ML IJ SOLN
0.5000 mg | Freq: Once | INTRAMUSCULAR | Status: AC | PRN
  Administered 2014-08-25: 0.5 mg via INTRAVENOUS

## 2014-08-25 MED ORDER — ATROPINE SULFATE 1 MG/ML IJ SOLN
INTRAMUSCULAR | Status: AC
  Filled 2014-08-25: qty 1

## 2014-08-25 MED ORDER — CYANOCOBALAMIN 1000 MCG/ML IJ SOLN
INTRAMUSCULAR | Status: AC
  Filled 2014-08-25: qty 1

## 2014-08-25 MED ORDER — DEXTROSE 5 % IV SOLN
Freq: Once | INTRAVENOUS | Status: AC
  Administered 2014-08-25: 09:00:00 via INTRAVENOUS

## 2014-08-25 MED ORDER — SODIUM CHLORIDE 0.9 % IV SOLN
Freq: Once | INTRAVENOUS | Status: AC
  Administered 2014-08-25: 09:00:00 via INTRAVENOUS
  Filled 2014-08-25: qty 8

## 2014-08-25 MED ORDER — IRINOTECAN HCL CHEMO INJECTION 100 MG/5ML
134.0000 mg/m2 | Freq: Once | INTRAVENOUS | Status: AC
  Administered 2014-08-25: 240 mg via INTRAVENOUS
  Filled 2014-08-25: qty 12

## 2014-08-25 MED ORDER — SODIUM CHLORIDE 0.9 % IV SOLN
510.0000 mg | Freq: Once | INTRAVENOUS | Status: AC
  Administered 2014-08-25: 510 mg via INTRAVENOUS
  Filled 2014-08-25: qty 17

## 2014-08-25 MED ORDER — LEUCOVORIN CALCIUM INJECTION 350 MG
201.0000 mg/m2 | Freq: Once | INTRAVENOUS | Status: AC
  Administered 2014-08-25: 360 mg via INTRAVENOUS
  Filled 2014-08-25: qty 18

## 2014-08-25 MED ORDER — HEPARIN SOD (PORK) LOCK FLUSH 100 UNIT/ML IV SOLN
500.0000 [IU] | Freq: Once | INTRAVENOUS | Status: DC | PRN
  Filled 2014-08-25: qty 5

## 2014-08-25 MED ORDER — SODIUM CHLORIDE 0.9 % IV SOLN
2560.0000 mg/m2 | INTRAVENOUS | Status: DC
  Administered 2014-08-25: 4600 mg via INTRAVENOUS
  Filled 2014-08-25: qty 92

## 2014-08-25 MED ORDER — CYANOCOBALAMIN 1000 MCG/ML IJ SOLN
1000.0000 ug | Freq: Once | INTRAMUSCULAR | Status: AC
  Administered 2014-08-25: 1000 ug via INTRAMUSCULAR

## 2014-08-25 MED ORDER — SODIUM CHLORIDE 0.9 % IJ SOLN
10.0000 mL | INTRAMUSCULAR | Status: DC | PRN
  Filled 2014-08-25: qty 10

## 2014-08-25 NOTE — Telephone Encounter (Signed)
Pt has applied for Medicaid and [place]. Both are pending at this time.   No Insurance

## 2014-08-25 NOTE — Patient Instructions (Addendum)
[place] Discharge Instructions for Patients Receiving Chemotherapy  Today you received the following chemotherapy agents Oxaliplatin, Leucovorin, Camptosar and 5FU.  To help prevent nausea and vomiting after your treatment, we encourage you to take your nausea medication.  1) Ondansetron (Zofran) Take 1 tablet in the am and one tablet in the pm beginning the day after chemotherapy ( Thursday) Take this for 3 days.  2) Do not need to take the Decadron since you are on Prednisone. AS NEEDED: 1) Ativan Take one tablet every 6 hours as needed for nausea and/or anxiety or to help with sleep 2) Compazine 10 mg.  Take one tablet every 6 hours as needed for nausea.  Do not need to take Ferrous Sulfate anymore   [place] Discharge Instructions for Patients Receiving Chemotherapy  Today you received the following chemotherapy agents 5FU  To help prevent nausea and vomiting after your treatment, we encourage you to take your nausea medication as prescribed    If you develop nausea and vomiting that is not controlled by your nausea medication, call the clinic. If it is after clinic hours your family physician or the after hours number for the clinic or go to the Emergency Department.   BELOW ARE SYMPTOMS THAT SHOULD BE REPORTED IMMEDIATELY:  *FEVER GREATER THAN 100.5 F  *CHILLS WITH OR WITHOUT FEVER  NAUSEA AND VOMITING THAT IS NOT CONTROLLED WITH YOUR NAUSEA MEDICATION  *UNUSUAL SHORTNESS OF BREATH  *UNUSUAL BRUISING OR BLEEDING  TENDERNESS IN MOUTH AND THROAT WITH OR WITHOUT PRESENCE OF ULCERS  *URINARY PROBLEMS  *BOWEL PROBLEMS  UNUSUAL RASH Items with * indicate a potential emergency and should be followed up as soon as possible.  One of the nurses will contact you 24 hours after your treatment. Please let the nurse know about any problems that you may have experienced. Feel free to call the clinic you have any questions or concerns. The  clinic phone number is [phone].   I have been informed and understand all the instructions given to me. I know to contact the clinic, my physician, or go to the Emergency Department if any problems should occur. I do not have any questions at this time, but understand that I may call the clinic during office hours   should I have any questions or need assistance in obtaining follow up care.    __________________________________________  _____________  __________ Signature of Patient or Authorized Representative            Date                   Time    __________________________________________ Nurse's Signature    mg Take one tablet every 6 hours as needed for     [facility] Discharge Instructions for Patients Receiving Chemotherapy  Today you received the following chemotherapy agents 5FU  To help prevent nausea and vomiting after your treatment, we encourage you to take your nausea medication as prescribed    If you develop nausea and vomiting that is not controlled by your nausea medication, call the clinic. If it is after clinic hours your family physician or the after hours number for the clinic or go to the Emergency Department.   BELOW ARE SYMPTOMS THAT SHOULD BE REPORTED IMMEDIATELY:  *FEVER GREATER THAN 100.5 F  *CHILLS WITH OR WITHOUT FEVER  NAUSEA AND VOMITING THAT IS NOT CONTROLLED WITH YOUR NAUSEA MEDICATION  *UNUSUAL SHORTNESS OF BREATH  *UNUSUAL BRUISING OR  BLEEDING  TENDERNESS IN MOUTH AND THROAT WITH OR WITHOUT PRESENCE OF ULCERS  *URINARY PROBLEMS  *BOWEL PROBLEMS  UNUSUAL RASH Items with * indicate a potential emergency and should be followed up as soon as possible.  One of the nurses will contact you 24 hours after your treatment. Please let the nurse know about any problems that you may have experienced. Feel free to call the clinic you have any questions or concerns. The clinic phone number is [phone].   I have  been informed and understand all the instructions given to me. I know to contact the clinic, my physician, or go to the Emergency Department if any problems should occur. I do not have any questions at this time, but understand that I may call the clinic during office hours   should I have any questions or need assistance in obtaining follow up care.    __________________________________________  _____________  __________ Signature of Patient or Authorized Representative            Date                   Time    __________________________________________ Nurse's Signature    mg     If you develop nausea and vomiting that is not controlled by your nausea medication, call the clinic.   BELOW ARE SYMPTOMS THAT SHOULD BE REPORTED IMMEDIATELY:  *FEVER GREATER THAN 100.5 F  *CHILLS WITH OR WITHOUT FEVER  NAUSEA AND VOMITING THAT IS NOT CONTROLLED WITH YOUR NAUSEA MEDICATION  *UNUSUAL SHORTNESS OF BREATH  *UNUSUAL BRUISING OR BLEEDING  TENDERNESS IN MOUTH AND THROAT WITH OR WITHOUT PRESENCE OF ULCERS  *URINARY PROBLEMS  *BOWEL PROBLEMS  UNUSUAL RASH Items with * indicate a potential emergency and should be followed up as soon as possible.  Feel free to call the clinic you have any questions or concerns. The clinic phone number is [phone].  Please show the [place] at check-in to the Emergency Department and triage nurse.  Ferumoxytol injection What is this medicine? FERUMOXYTOL is an iron complex. Iron is used to make healthy red blood cells, which carry oxygen and nutrients throughout the body. This medicine is used to treat iron deficiency anemia in people with chronic kidney disease. This medicine may be used for other purposes; ask your health care provider or pharmacist if you have questions. COMMON BRAND NAME(S): Feraheme What should I tell my health care provider before I take this medicine? They need to know if you have any of these  conditions: -anemia not caused by low iron levels -high levels of iron in the blood -magnetic resonance imaging (MRI) test scheduled -an unusual or allergic reaction to iron, other medicines, foods, dyes, or preservatives -pregnant or trying to get pregnant -breast-feeding How should I use this medicine? This medicine is for injection into a vein. It is given by a health care professional in a hospital or clinic setting. Talk to your pediatrician regarding the use of this medicine in children. Special care may be needed. Overdosage: If you think you've taken too much of this medicine contact a poison control center or emergency room at once. Overdosage: If you think you have taken too much of this medicine contact a poison control center or emergency room at once. NOTE: This medicine is only for you. Do not share this medicine with others. What if I miss a dose? It is important not to miss your dose. Call your doctor or health care professional  if you are unable to keep an appointment. What may interact with this medicine? This medicine may interact with the following medications: -other iron products This list may not describe all possible interactions. Give your health care provider a list of all the medicines, herbs, non-prescription drugs, or dietary supplements you use. Also tell them if you smoke, drink alcohol, or use illegal drugs. Some items may interact with your medicine. What should I watch for while using this medicine? Visit your doctor or healthcare professional regularly. Tell your doctor or healthcare professional if your symptoms do not start to get better or if they get worse. You may need blood work done while you are taking this medicine. You may need to follow a special diet. Talk to your doctor. Foods that contain iron include: whole grains/cereals, dried fruits, beans, or peas, leafy green vegetables, and organ meats (liver, kidney). What side effects may I notice from  receiving this medicine? Side effects that you should report to your doctor or health care professional as soon as possible: -allergic reactions like skin rash, itching or hives, swelling of the face, lips, or tongue -breathing problems -changes in blood pressure -feeling faint or lightheaded, falls -fever or chills -flushing, sweating, or hot feelings -swelling of the ankles or feet Side effects that usually do not require medical attention (Report these to your doctor or health care professional if they continue or are bothersome.): -diarrhea -headache -nausea, vomiting -stomach pain This list may not describe all possible side effects. Call your doctor for medical advice about side effects. You may report side effects to FDA at 1-800-FDA-1088. Where should I keep my medicine? This drug is given in a hospital or clinic and will not be stored at home. NOTE: This sheet is a summary. It may not cover all possible information. If you have questions about this medicine, talk to your doctor, pharmacist, or health care provider.  2015, Elsevier/Gold Standard. (2012-01-04 15:23:36)

## 2014-08-26 ENCOUNTER — Ambulatory Visit: Payer: Self-pay

## 2014-08-26 ENCOUNTER — Inpatient Hospital Stay: Payer: Self-pay | Admitting: Oncology

## 2014-08-26 ENCOUNTER — Ambulatory Visit: Payer: Self-pay | Admitting: Oncology

## 2014-08-26 ENCOUNTER — Telehealth: Payer: Self-pay | Admitting: *Deleted

## 2014-08-26 NOTE — Telephone Encounter (Signed)
-----   Message from Graylon Gunning, RN sent at 08/25/2014 11:14 AM EDT ----- Regarding: chemo follow up call First time Oxaliplatin, Leucovorin, Camptosar and 5FU.   Thanks  FYI---- Trial in basket to follow up on new chemos.

## 2014-08-26 NOTE — Telephone Encounter (Signed)
Called patient to see how he was doing.  Spoke with his wife.  He has had no nausea or vomiting and has been doing well since treatment

## 2014-08-27 ENCOUNTER — Ambulatory Visit (HOSPITAL_BASED_OUTPATIENT_CLINIC_OR_DEPARTMENT_OTHER): Payer: Medicaid Other

## 2014-08-27 ENCOUNTER — Other Ambulatory Visit: Payer: Self-pay | Admitting: Emergency Medicine

## 2014-08-27 ENCOUNTER — Other Ambulatory Visit: Payer: Self-pay | Admitting: Nurse Practitioner

## 2014-08-27 VITALS — BP 126/73 | HR 62 | Temp 97.4°F | Resp 16

## 2014-08-27 DIAGNOSIS — C184 Malignant neoplasm of transverse colon: Secondary | ICD-10-CM

## 2014-08-27 DIAGNOSIS — C189 Malignant neoplasm of colon, unspecified: Secondary | ICD-10-CM

## 2014-08-27 MED ORDER — TRAMADOL HCL 50 MG PO TABS: 50.0000 mg | ORAL_TABLET | Freq: Four times a day (QID) | ORAL | Status: DC | PRN

## 2014-08-27 MED ORDER — MEGESTROL ACETATE 400 MG/10ML PO SUSP: 600.0000 mg | Freq: Every day | ORAL | Status: DC

## 2014-08-27 MED ORDER — SODIUM CHLORIDE 0.9 % IV SOLN
1000.0000 mL | INTRAVENOUS | Status: DC
  Administered 2014-08-27: 12:00:00 via INTRAVENOUS

## 2014-08-27 NOTE — Patient Instructions (Signed)
Dehydration, Adult Dehydration is when you lose more fluids from the body than you take in. Vital organs like the kidneys, brain, and heart cannot function without a proper amount of fluids and salt. Any loss of fluids from the body can cause dehydration.  CAUSES   Vomiting.  Diarrhea.  Excessive sweating.  Excessive urine output.  Fever. SYMPTOMS  Mild dehydration  Thirst.  Dry lips.  Slightly dry mouth. Moderate dehydration  Very dry mouth.  Sunken eyes.  Skin does not bounce back quickly when lightly pinched and released.  Dark urine and decreased urine production.  Decreased tear production.  Headache. Severe dehydration  Very dry mouth.  Extreme thirst.  Rapid, weak pulse (more than 100 beats per minute at rest).  Cold hands and feet.  Not able to sweat in spite of heat and temperature.  Rapid breathing.  Blue lips.  Confusion and lethargy.  Difficulty being awakened.  Minimal urine production.  No tears. DIAGNOSIS  Your caregiver will diagnose dehydration based on your symptoms and your exam. Blood and urine tests will help confirm the diagnosis. The diagnostic evaluation should also identify the cause of dehydration. TREATMENT  Treatment of mild or moderate dehydration can often be done at home by increasing the amount of fluids that you drink. It is best to drink small amounts of fluid more often. Drinking too much at one time can make vomiting worse. Refer to the home care instructions below. Severe dehydration needs to be treated at the hospital where you will probably be given intravenous (IV) fluids that contain water and electrolytes. HOME CARE INSTRUCTIONS   Ask your caregiver about specific rehydration instructions.  Drink enough fluids to keep your urine clear or pale yellow.  Drink small amounts frequently if you have nausea and vomiting.  Eat as you normally do.  Avoid:  Foods or drinks high in sugar.  Carbonated  drinks.  Juice.  Extremely hot or cold fluids.  Drinks with caffeine.  Fatty, greasy foods.  Alcohol.  Tobacco.  Overeating.  Gelatin desserts.  Wash your hands well to avoid spreading bacteria and viruses.  Only take over-the-counter or prescription medicines for pain, discomfort, or fever as directed by your caregiver.  Ask your caregiver if you should continue all prescribed and over-the-counter medicines.  Keep all follow-up appointments with your caregiver. SEEK MEDICAL CARE IF:  You have abdominal pain and it increases or stays in one area (localizes).  You have a rash, stiff neck, or severe headache.  You are irritable, sleepy, or difficult to awaken.  You are weak, dizzy, or extremely thirsty. SEEK IMMEDIATE MEDICAL CARE IF:   You are unable to keep fluids down or you get worse despite treatment.  You have frequent episodes of vomiting or diarrhea.  You have blood or green matter (bile) in your vomit.  You have blood in your stool or your stool looks black and tarry.  You have not urinated in 6 to 8 hours, or you have only urinated a small amount of very dark urine.  You have a fever.  You faint. MAKE SURE YOU:   Understand these instructions.  Will watch your condition.  Will get help right away if you are not doing well or get worse. Document Released: 05/21/2005 Document Revised: 08/13/2011 Document Reviewed: 01/08/2011 ExitCare Patient Information 2015 [employer]. This information is not intended to replace advice given to you by your health care provider. Make sure you discuss any questions you have with your health care   provider.  

## 2014-08-29 ENCOUNTER — Emergency Department (HOSPITAL_BASED_OUTPATIENT_CLINIC_OR_DEPARTMENT_OTHER): Payer: Medicaid Other

## 2014-08-29 ENCOUNTER — Encounter (HOSPITAL_BASED_OUTPATIENT_CLINIC_OR_DEPARTMENT_OTHER): Payer: Self-pay | Admitting: *Deleted

## 2014-08-29 ENCOUNTER — Inpatient Hospital Stay (HOSPITAL_BASED_OUTPATIENT_CLINIC_OR_DEPARTMENT_OTHER)
Admission: EM | Admit: 2014-08-29 | Discharge: 2014-09-01 | DRG: 391 | Disposition: A | Discharge status: Home / Self Care | Payer: Medicaid Other | Attending: Internal Medicine | Admitting: Internal Medicine

## 2014-08-29 DIAGNOSIS — Z6820 Body mass index (BMI) 20.0-20.9, adult: Secondary | ICD-10-CM

## 2014-08-29 DIAGNOSIS — T451X5A Adverse effect of antineoplastic and immunosuppressive drugs, initial encounter: Secondary | ICD-10-CM | POA: Diagnosis present

## 2014-08-29 DIAGNOSIS — C189 Malignant neoplasm of colon, unspecified: Secondary | ICD-10-CM | POA: Diagnosis present

## 2014-08-29 DIAGNOSIS — Z7952 Long term (current) use of systemic steroids: Secondary | ICD-10-CM | POA: Diagnosis not present

## 2014-08-29 DIAGNOSIS — D649 Anemia, unspecified: Secondary | ICD-10-CM | POA: Diagnosis not present

## 2014-08-29 DIAGNOSIS — D6481 Anemia due to antineoplastic chemotherapy: Secondary | ICD-10-CM | POA: Diagnosis present

## 2014-08-29 DIAGNOSIS — Z79891 Long term (current) use of opiate analgesic: Secondary | ICD-10-CM

## 2014-08-29 DIAGNOSIS — E780 Pure hypercholesterolemia: Secondary | ICD-10-CM | POA: Diagnosis present

## 2014-08-29 DIAGNOSIS — F419 Anxiety disorder, unspecified: Secondary | ICD-10-CM | POA: Diagnosis present

## 2014-08-29 DIAGNOSIS — C7989 Secondary malignant neoplasm of other specified sites: Secondary | ICD-10-CM | POA: Diagnosis present

## 2014-08-29 DIAGNOSIS — C184 Malignant neoplasm of transverse colon: Secondary | ICD-10-CM | POA: Diagnosis not present

## 2014-08-29 DIAGNOSIS — E43 Unspecified severe protein-calorie malnutrition: Secondary | ICD-10-CM | POA: Diagnosis present

## 2014-08-29 DIAGNOSIS — Z87891 Personal history of nicotine dependence: Secondary | ICD-10-CM

## 2014-08-29 DIAGNOSIS — K529 Noninfective gastroenteritis and colitis, unspecified: Principal | ICD-10-CM | POA: Diagnosis present

## 2014-08-29 DIAGNOSIS — D62 Acute posthemorrhagic anemia: Secondary | ICD-10-CM | POA: Diagnosis present

## 2014-08-29 DIAGNOSIS — E860 Dehydration: Secondary | ICD-10-CM | POA: Diagnosis present

## 2014-08-29 DIAGNOSIS — Z79899 Other long term (current) drug therapy: Secondary | ICD-10-CM

## 2014-08-29 DIAGNOSIS — E785 Hyperlipidemia, unspecified: Secondary | ICD-10-CM | POA: Diagnosis present

## 2014-08-29 DIAGNOSIS — E876 Hypokalemia: Secondary | ICD-10-CM | POA: Diagnosis present

## 2014-08-29 DIAGNOSIS — R112 Nausea with vomiting, unspecified: Secondary | ICD-10-CM | POA: Diagnosis present

## 2014-08-29 DIAGNOSIS — E46 Unspecified protein-calorie malnutrition: Secondary | ICD-10-CM | POA: Diagnosis not present

## 2014-08-29 DIAGNOSIS — Z7982 Long term (current) use of aspirin: Secondary | ICD-10-CM | POA: Diagnosis not present

## 2014-08-29 DIAGNOSIS — R109 Unspecified abdominal pain: Secondary | ICD-10-CM

## 2014-08-29 DIAGNOSIS — I10 Essential (primary) hypertension: Secondary | ICD-10-CM | POA: Diagnosis present

## 2014-08-29 LAB — URINALYSIS, ROUTINE W REFLEX MICROSCOPIC
Bilirubin Urine: NEGATIVE
Glucose, UA: NEGATIVE mg/dL
Hgb urine dipstick: NEGATIVE
Ketones, ur: NEGATIVE mg/dL
LEUKOCYTES UA: NEGATIVE
NITRITE: NEGATIVE
PH: 6 (ref 5.0–8.0)
Protein, ur: NEGATIVE mg/dL
Specific Gravity, Urine: >1.046 — ABNORMAL HIGH (ref 1.005–1.030)
Urobilinogen, UA: 0.2 mg/dL (ref 0.0–1.0)

## 2014-08-29 LAB — CBC WITH DIFFERENTIAL/PLATELET
Basophils Absolute: 0 10*3/uL (ref 0.0–0.1)
Basophils Relative: 0 % (ref 0–1)
EOS PCT: 4 % (ref 0–5)
Eosinophils Absolute: 0.6 10*3/uL (ref 0.0–0.7)
HCT: 38.9 % — ABNORMAL LOW (ref 39.0–52.0)
Hemoglobin: 11.7 g/dL — ABNORMAL LOW (ref 13.0–17.0)
Lymphocytes Relative: 11 % — ABNORMAL LOW (ref 12–46)
Lymphs Abs: 1.7 10*3/uL (ref 0.7–4.0)
MCH: 22.8 pg — AB (ref 26.0–34.0)
MCHC: 30.1 g/dL (ref 30.0–36.0)
MCV: 75.7 fL — ABNORMAL LOW (ref 78.0–100.0)
Monocytes Absolute: 0.3 10*3/uL (ref 0.1–1.0)
Monocytes Relative: 2 % — ABNORMAL LOW (ref 3–12)
NEUTROS ABS: 13.3 10*3/uL — AB (ref 1.7–7.7)
Neutrophils Relative %: 83 % — ABNORMAL HIGH (ref 43–77)
Platelets: 447 10*3/uL — ABNORMAL HIGH (ref 150–400)
RBC: 5.14 MIL/uL (ref 4.22–5.81)
RDW: 22.3 % — AB (ref 11.5–15.5)
WBC: 15.9 10*3/uL — ABNORMAL HIGH (ref 4.0–10.5)

## 2014-08-29 LAB — CBC
HCT: 34.3 % — ABNORMAL LOW (ref 39.0–52.0)
HEMOGLOBIN: 10.1 g/dL — AB (ref 13.0–17.0)
MCH: 22.8 pg — ABNORMAL LOW (ref 26.0–34.0)
MCHC: 29.4 g/dL — ABNORMAL LOW (ref 30.0–36.0)
MCV: 77.4 fL — ABNORMAL LOW (ref 78.0–100.0)
Platelets: 400 10*3/uL (ref 150–400)
RBC: 4.43 MIL/uL (ref 4.22–5.81)
RDW: 20.9 % — ABNORMAL HIGH (ref 11.5–15.5)
WBC: 10.3 10*3/uL (ref 4.0–10.5)

## 2014-08-29 LAB — COMPREHENSIVE METABOLIC PANEL
ALBUMIN: 3.4 g/dL — AB (ref 3.5–5.2)
ALT: 14 U/L (ref 0–53)
ANION GAP: 12 (ref 5–15)
AST: 17 U/L (ref 0–37)
Alkaline Phosphatase: 84 U/L (ref 39–117)
BUN: 22 mg/dL (ref 6–23)
CALCIUM: 9.4 mg/dL (ref 8.4–10.5)
CO2: 26 mmol/L (ref 19–32)
Chloride: 103 mmol/L (ref 96–112)
Creatinine, Ser: 0.71 mg/dL (ref 0.50–1.35)
GFR calc non Af Amer: >90 mL/min (ref >90)
GLUCOSE: 139 mg/dL — AB (ref 70–99)
POTASSIUM: 3.3 mmol/L — AB (ref 3.5–5.1)
SODIUM: 141 mmol/L (ref 135–145)
Total Bilirubin: 0.4 mg/dL (ref 0.3–1.2)
Total Protein: 7.4 g/dL (ref 6.0–8.3)

## 2014-08-29 LAB — CREATININE, SERUM
CREATININE: 0.56 mg/dL (ref 0.50–1.35)
GFR calc Af Amer: >90 mL/min (ref >90)
GFR calc non Af Amer: >90 mL/min (ref >90)

## 2014-08-29 LAB — LIPASE, BLOOD: LIPASE: 27 U/L (ref 11–59)

## 2014-08-29 MED ORDER — IOHEXOL 300 MG/ML  SOLN
25.0000 mL | Freq: Once | INTRAMUSCULAR | Status: AC | PRN
  Administered 2014-08-29: 25 mL via ORAL

## 2014-08-29 MED ORDER — ENOXAPARIN SODIUM 40 MG/0.4ML ~~LOC~~ SOLN
40.0000 mg | SUBCUTANEOUS | Status: DC
  Administered 2014-08-29 – 2014-08-31 (×3): 40 mg via SUBCUTANEOUS
  Filled 2014-08-29 (×4): qty 0.4

## 2014-08-29 MED ORDER — ACETAMINOPHEN 325 MG PO TABS: 650.0000 mg | ORAL_TABLET | Freq: Four times a day (QID) | ORAL | Status: DC | PRN

## 2014-08-29 MED ORDER — METRONIDAZOLE IN NACL 5-0.79 MG/ML-% IV SOLN
500.0000 mg | Freq: Once | INTRAVENOUS | Status: AC
  Administered 2014-08-29: 500 mg via INTRAVENOUS
  Filled 2014-08-29: qty 100

## 2014-08-29 MED ORDER — ENSURE ENLIVE PO LIQD: 237.0000 mL | Freq: Two times a day (BID) | ORAL | Status: DC

## 2014-08-29 MED ORDER — IOHEXOL 300 MG/ML  SOLN
100.0000 mL | Freq: Once | INTRAMUSCULAR | Status: AC | PRN
  Administered 2014-08-29: 100 mL via INTRAVENOUS

## 2014-08-29 MED ORDER — HYDROMORPHONE HCL 1 MG/ML IJ SOLN: 1.0000 mg | INTRAMUSCULAR | Status: DC | PRN

## 2014-08-29 MED ORDER — PROMETHAZINE HCL 25 MG/ML IJ SOLN
12.5000 mg | Freq: Once | INTRAMUSCULAR | Status: AC
  Administered 2014-08-29: 12.5 mg via INTRAVENOUS
  Filled 2014-08-29: qty 1

## 2014-08-29 MED ORDER — MEGESTROL ACETATE 400 MG/10ML PO SUSP
600.0000 mg | Freq: Every day | ORAL | Status: DC
  Administered 2014-08-29 – 2014-09-01 (×4): 600 mg via ORAL
  Filled 2014-08-29 (×4): qty 15

## 2014-08-29 MED ORDER — TEMAZEPAM 15 MG PO CAPS
15.0000 mg | ORAL_CAPSULE | Freq: Every evening | ORAL | Status: DC | PRN
  Administered 2014-08-30 – 2014-08-31 (×2): 15 mg via ORAL
  Filled 2014-08-29 (×2): qty 1

## 2014-08-29 MED ORDER — PREDNISONE 20 MG PO TABS
20.0000 mg | ORAL_TABLET | Freq: Every day | ORAL | Status: DC
  Administered 2014-08-29 – 2014-08-30 (×2): 20 mg via ORAL
  Filled 2014-08-29 (×3): qty 1

## 2014-08-29 MED ORDER — SODIUM CHLORIDE 0.9 % IV SOLN: Freq: Once | INTRAVENOUS | Status: DC

## 2014-08-29 MED ORDER — ONDANSETRON HCL 4 MG PO TABS: 4.0000 mg | ORAL_TABLET | Freq: Four times a day (QID) | ORAL | Status: DC | PRN

## 2014-08-29 MED ORDER — ASPIRIN EC 81 MG PO TBEC
81.0000 mg | DELAYED_RELEASE_TABLET | Freq: Every day | ORAL | Status: DC
  Administered 2014-08-29 – 2014-09-01 (×4): 81 mg via ORAL
  Filled 2014-08-29 (×4): qty 1

## 2014-08-29 MED ORDER — LORAZEPAM 2 MG/ML IJ SOLN
0.5000 mg | Freq: Once | INTRAMUSCULAR | Status: AC
  Administered 2014-08-29: 0.5 mg via INTRAVENOUS
  Filled 2014-08-29: qty 1

## 2014-08-29 MED ORDER — CIPROFLOXACIN IN D5W 400 MG/200ML IV SOLN
400.0000 mg | Freq: Once | INTRAVENOUS | Status: AC
  Administered 2014-08-29: 400 mg via INTRAVENOUS
  Filled 2014-08-29: qty 200

## 2014-08-29 MED ORDER — MIRTAZAPINE 7.5 MG PO TABS
7.5000 mg | ORAL_TABLET | Freq: Every day | ORAL | Status: DC
  Administered 2014-08-29: 7.5 mg via ORAL
  Filled 2014-08-29 (×5): qty 2

## 2014-08-29 MED ORDER — MORPHINE SULFATE 4 MG/ML IJ SOLN
4.0000 mg | Freq: Once | INTRAMUSCULAR | Status: AC
  Administered 2014-08-29: 4 mg via INTRAVENOUS
  Filled 2014-08-29: qty 1

## 2014-08-29 MED ORDER — SODIUM CHLORIDE 0.9 % IV SOLN
INTRAVENOUS | Status: DC
  Administered 2014-08-29: 100 mL/h via INTRAVENOUS
  Administered 2014-08-30 – 2014-09-01 (×3): via INTRAVENOUS

## 2014-08-29 MED ORDER — BOOST / RESOURCE BREEZE PO LIQD
1.0000 | Freq: Three times a day (TID) | ORAL | Status: DC
  Administered 2014-09-01: 1 via ORAL

## 2014-08-29 MED ORDER — ALPRAZOLAM 1 MG PO TABS
1.0000 mg | ORAL_TABLET | Freq: Four times a day (QID) | ORAL | Status: DC | PRN
  Administered 2014-08-29 – 2014-09-01 (×10): 1 mg via ORAL
  Filled 2014-08-29 (×10): qty 1

## 2014-08-29 MED ORDER — METRONIDAZOLE IN NACL 5-0.79 MG/ML-% IV SOLN
500.0000 mg | Freq: Three times a day (TID) | INTRAVENOUS | Status: DC
  Administered 2014-08-29 – 2014-09-01 (×9): 500 mg via INTRAVENOUS
  Filled 2014-08-29 (×10): qty 100

## 2014-08-29 MED ORDER — HYDROCODONE-ACETAMINOPHEN 5-325 MG PO TABS
1.0000 | ORAL_TABLET | ORAL | Status: DC | PRN
  Filled 2014-08-29: qty 2

## 2014-08-29 MED ORDER — ONDANSETRON HCL 4 MG/2ML IJ SOLN: 4.0000 mg | Freq: Four times a day (QID) | INTRAMUSCULAR | Status: DC | PRN

## 2014-08-29 MED ORDER — ACETAMINOPHEN 650 MG RE SUPP: 650.0000 mg | Freq: Four times a day (QID) | RECTAL | Status: DC | PRN

## 2014-08-29 MED ORDER — POTASSIUM CHLORIDE CRYS ER 20 MEQ PO TBCR
40.0000 meq | EXTENDED_RELEASE_TABLET | Freq: Once | ORAL | Status: AC
  Administered 2014-08-29: 40 meq via ORAL
  Filled 2014-08-29: qty 2

## 2014-08-29 MED ORDER — PRAVASTATIN SODIUM 40 MG PO TABS
40.0000 mg | ORAL_TABLET | Freq: Every day | ORAL | Status: DC
  Administered 2014-08-29 – 2014-08-31 (×3): 40 mg via ORAL
  Filled 2014-08-29 (×4): qty 1

## 2014-08-29 MED ORDER — SODIUM CHLORIDE 0.9 % IV BOLUS (SEPSIS)
1000.0000 mL | Freq: Once | INTRAVENOUS | Status: AC
  Administered 2014-08-29: 1000 mL via INTRAVENOUS

## 2014-08-29 MED ORDER — LORAZEPAM 0.5 MG PO TABS: 0.5000 mg | ORAL_TABLET | Freq: Four times a day (QID) | ORAL | Status: DC | PRN

## 2014-08-29 NOTE — ED Notes (Signed)
Attempting to drink contrast. States he feels better after pain/nausea meds.

## 2014-08-29 NOTE — ED Notes (Signed)
Transported to CT 

## 2014-08-29 NOTE — ED Notes (Signed)
Pt requesting something for anxiety. Dr. Lita Mains aware and orders received.

## 2014-08-29 NOTE — ED Provider Notes (Signed)
CSN: [id number]     Arrival date & time 08/29/14  5003 History   First MD Initiated Contact with Patient 08/29/14 [phone]     Chief Complaint  Patient presents with  . Abdominal Pain     (Consider location/radiation/quality/duration/timing/severity/associated sxs/prior Treatment) HPI She has recent diagnosis of colon cancer with metastasis having undergone resection in February. Started chemotherapy last week. Began having intermittent abdominal pain with vomiting and diarrhea. States he's had about 5-6 episodes of both vomiting and diarrhea. No fever or chills. Patient complains of generalized malaise and fatigue. No blood in vomit or stool.hasn't taken Zofran and Ultram at home with little improvement Past Medical History  Diagnosis Date  . Hypertension   . High cholesterol   . Anxiety   . Colon cancer 07/30/2014  . Colon cancer metastasized to multiple sites 08/20/2014   Past Surgical History  Procedure Laterality Date  . Colonoscopy N/A 07/29/2014    Procedure: COLONOSCOPY;  Surgeon: Ladene Artist, MD;  Location: [facility];  Service: Endoscopy;  Laterality: N/A;  . Laparoscopic partial colectomy N/A 07/30/2014    Procedure: LAPAROSCOPY CONVERTED TO EXPLORATORY LAPAROTOMY EXTENDED RIGHT COLECTOMY BIOPSY OF DIAPHRAGMATIC IMPLANT CLOSURE OF DIAPHRAGM CHOLECYSTECTOMY;  Surgeon: Jackolyn Confer, MD;  Location: [facility];  Service: General;  Laterality: N/A;  . Cholecystectomy N/A 07/30/2014    Procedure: LAPAROSCOPIC CHOLECYSTECTOMY WITH INTRAOPERATIVE CHOLANGIOGRAM;  Surgeon: Jackolyn Confer, MD;  Location: [facility];  Service: General;  Laterality: N/A;  . Ercp N/A 08/06/2014    Procedure: ENDOSCOPIC RETROGRADE CHOLANGIOPANCREATOGRAPHY (ERCP);  Surgeon: Inda Castle, MD;  Location: [facility];  Service: Endoscopy;  Laterality: N/A;  . Portacath placement Right 08/16/2014    Procedure: INSERTION PORT-A-CATH;  Surgeon: Excell Seltzer, MD;  Location: [facility];  Service: General;  Laterality:  Right;  port placement right subclavian   No family history on file. History  Substance Use Topics  . Smoking status: Former Smoker -- 1.00 packs/day for 30 years    Types: Cigarettes    Quit date: 07/28/2014  . Smokeless tobacco: Never Used     Comment: quit in March 2016  . Alcohol Use: No    Review of Systems  Constitutional: Positive for fatigue. Negative for fever and chills.  Respiratory: Negative for shortness of breath.   Cardiovascular: Negative for chest pain.  Gastrointestinal: Positive for nausea, vomiting, abdominal pain and diarrhea. Negative for constipation and blood in stool.  Musculoskeletal: Negative for back pain, neck pain and neck stiffness.  Skin: Negative for rash and wound.  Neurological: Positive for weakness (generalized). Negative for dizziness, light-headedness, numbness and headaches.  All other systems reviewed and are negative.     Allergies  Review of patient's allergies indicates no known allergies.  Home Medications   Prior to Admission medications   Medication Sig Start Date End Date Taking? Authorizing Provider  ALPRAZolam Duanne Moron) 1 MG tablet Take 1 mg by mouth 4 (four) times daily as needed.     Historical Provider, MD  aspirin 81 MG tablet Take 81 mg by mouth daily.    Historical Provider, MD  atenolol (TENORMIN) 100 MG tablet Take 100 mg by mouth daily.    Historical Provider, MD  dexamethasone (DECADRON) 4 MG tablet Take 2 tablets (8 mg total) by mouth 2 (two) times daily with a meal. Start the day after chemotherapy for 3 days. Take with food. Patient not taking: Reported on 08/25/2014 08/24/14   Volanda Napoleon, MD  feeding supplement, ENSURE, (ENSURE) PUDG Take 1 Container  by mouth 3 (three) times daily between meals. 08/16/14   Venetia Maxon Rama, MD  lidocaine-prilocaine (EMLA) cream Apply to affected area once 08/24/14   Volanda Napoleon, MD  loperamide (IMODIUM A-D) 2 MG tablet Take 2 at onset of diarrhea, then 1 every 2hrs until 12hr  without a BM. May take 2 tab every 4hrs at bedtime. If diarrhea recurs repeat. 08/24/14   Volanda Napoleon, MD  LORazepam (ATIVAN) 0.5 MG tablet Take 1 tablet (0.5 mg total) by mouth every 6 (six) hours as needed for anxiety. 08/24/14   Volanda Napoleon, MD  lovastatin (MEVACOR) 40 MG tablet Take 40 mg by mouth at bedtime.    Historical Provider, MD  megestrol (MEGACE) 400 MG/10ML suspension Take 15 mLs (600 mg total) by mouth daily. 08/27/14   Volanda Napoleon, MD  mirtazapine (REMERON) 15 MG tablet Take 0.5-1 tablets (7.5-15 mg total) by mouth at bedtime. 08/16/14   Venetia Maxon Rama, MD  multivitamin-iron-minerals-folic acid (CENTRUM) chewable tablet Chew 1 tablet by mouth daily. 07/25/14   Carmin Muskrat, MD  ondansetron (ZOFRAN) 8 MG tablet Take 1 tablet (8 mg total) by mouth 2 (two) times daily. Start the day after chemo for 3 days. Then take as needed for nausea or vomiting. 08/24/14   Volanda Napoleon, MD  predniSONE (DELTASONE) 20 MG tablet Take 1 tablet (20 mg total) by mouth daily with breakfast. 08/23/14   Volanda Napoleon, MD  prochlorperazine (COMPAZINE) 10 MG tablet Take 1 tablet (10 mg total) by mouth every 6 (six) hours as needed (Nausea or vomiting). 08/24/14   Volanda Napoleon, MD  senna (SENOKOT) 8.6 MG TABS tablet Take 1 tablet (8.6 mg total) by mouth daily as needed for mild constipation. 08/16/14   Christina P Rama, MD  temazepam (RESTORIL) 15 MG capsule Take 1 capsule (15 mg total) by mouth at bedtime as needed for sleep. 08/20/14   Volanda Napoleon, MD  traMADol (ULTRAM) 50 MG tablet Take 1 tablet (50 mg total) by mouth every 6 (six) hours as needed for severe pain. 08/27/14   Volanda Napoleon, MD   BP 118/69 mmHg  Pulse 59  Temp(Src) 97.9 F (36.6 C) (Oral)  Resp 16  Ht 5\' 9"  (1.753 m)  Wt 145 lb (65.772 kg)  BMI 21.40 kg/m2  SpO2 99% Physical Exam  Constitutional: He is oriented to person, place, and time. He appears well-developed. No distress.  Dry appearing  HENT:  Head:  Normocephalic and atraumatic.  Dry mucous membranes  Eyes: EOM are normal. Pupils are equal, round, and reactive to light. Right eye exhibits no discharge. Left eye exhibits no discharge. No scleral icterus.  Neck: Normal range of motion. Neck supple.  Cardiovascular: Normal rate and regular rhythm.   Pulmonary/Chest: Effort normal and breath sounds normal. No respiratory distress. He has no wheezes. He has no rales. He exhibits no tenderness.  Abdominal: Soft. Bowel sounds are normal. He exhibits no distension and no mass. There is tenderness (lower abdominal tenderness to palpation). There is no rebound and no guarding.  Midline incision is well healing without evidence of infection.  Musculoskeletal: Normal range of motion. He exhibits no edema or tenderness.  Neurological: He is alert and oriented to person, place, and time.  Ms. All extremities without deficit. Sensation is grossly intact.  Skin: Skin is warm and dry. No rash noted. No erythema.  Poor skin turgor  Psychiatric: He has a normal mood and affect. His behavior is normal.  Nursing note and vitals reviewed.   ED Course  Procedures (including critical care time) Labs Review Labs Reviewed  CBC WITH DIFFERENTIAL/PLATELET - Abnormal; Notable for the following:    WBC 15.9 (*)    Hemoglobin 11.7 (*)    HCT 38.9 (*)    MCV 75.7 (*)    MCH 22.8 (*)    RDW 22.3 (*)    Platelets 447 (*)    Neutrophils Relative % 83 (*)    Neutro Abs 13.3 (*)    Lymphocytes Relative 11 (*)    Monocytes Relative 2 (*)    All other components within normal limits  COMPREHENSIVE METABOLIC PANEL - Abnormal; Notable for the following:    Potassium 3.3 (*)    Glucose, Bld 139 (*)    Albumin 3.4 (*)    All other components within normal limits  URINALYSIS, ROUTINE W REFLEX MICROSCOPIC - Abnormal; Notable for the following:    Specific Gravity, Urine >1.046 (*)    All other components within normal limits  CLOSTRIDIUM DIFFICILE BY PCR  LIPASE,  BLOOD    Imaging Review Ct Abdomen Pelvis W Contrast  08/29/2014   CLINICAL DATA:  Lower abdominal pain for 10 hours. Emesis. History of colon cancer, currently treating with chemotherapy.  EXAM: CT ABDOMEN AND PELVIS WITH CONTRAST  TECHNIQUE: Multidetector CT imaging of the abdomen and pelvis was performed using the standard protocol following bolus administration of intravenous contrast.  CONTRAST:  25mL OMNIPAQUE IOHEXOL 300 MG/ML SOLN, 168mL OMNIPAQUE IOHEXOL 300 MG/ML SOLN  COMPARISON:  CT 08/10/2014  FINDINGS: Decreased pleural effusions from prior, small right pleural effusion persists. Near complete resolution of previous parenchymal opacities. There is a subpleural nodule in the right middle lobe measuring 4 mm.  Multiple hypodense liver lesions in the right and left hepatic lobe, largest measuring 14 mm, unchanged. Unchanged linear hypodensity in the right hepatic lobe. Clips in the gallbladder fossa from cholecystectomy. Biliary stent remains in place.  The spleen, adrenal glands, and kidneys are normal.  The stomach is distended with fluid. Fluid-filled normal caliber small bowel loops throughout the abdomen. There is bowel thickening of small bowel loops in the right lower abdomen. Ileal colonic anastomosis appears normal. There is fluid within the transverse and proximal descending colon. Patient is post right hemicolectomy. The previous postoperative drain is no longer seen.  Small amount of fluid adjacent to the liver. Decreased fluid in the pelvis with residual small volume of simple free fluid. No free intra-abdominal air.  The abdominal aorta is normal in caliber with dense atherosclerosis.  No intra-abdominal fluid collection.  Urinary bladder is physiologically distended. Prostate gland is normal in size.  Degenerative change in the lower lumbar spine. No focal osseous abnormality.  IMPRESSION: 1. Fluid-filled normal caliber small bowel loops, with wall thickening involving right lower  quadrant small bowel. Fluid within the transverse and descending colon. Findings suggest enteritis/colitis, infectious or inflammatory in etiology. There is no obstruction. No free air or fluid collection. 2. Decreased free fluid, small amount of free fluid persists in the pelvis. 3. Decreased pleural effusions, small right pleural effusion persists. Resolution of previous patchy opacities.   Electronically Signed   By: Jeb Levering M.D.   On: 08/29/2014 06:32     EKG Interpretation None      MDM   Final diagnoses:  Abdominal pain  Colitis   Patient is feeling better though still having tenderness in his lower abdomen on palpation. Diffuse enteritis/colitis on CT scan. Elevate. White  blood cell count. Clinically patient appears dehydrated. Discussed with Dr.Rai and will accept in transfer to [place] bed at [facility].    Julianne Rice, MD 08/29/14 [phone]

## 2014-08-29 NOTE — ED Notes (Signed)
Report given to [facility]  

## 2014-08-29 NOTE — ED Notes (Signed)
MD at bedside. 

## 2014-08-29 NOTE — Plan of Care (Signed)
Called by [facility] ED by Dr Marisa Hua  Briefly 59 year old male with recent diagnosis of colon cancer, metastasis, undergone resection, started chemotherapy last week, was recently discharged on 08/16/14 from [facility] presented to [facility] ED with intermittent abdominal pain with nausea and vomiting and diarrhea. Dehydrated.   Vital signs currently stable BP 118/69 mmHg  Pulse 59  Temp(Src) 97.9 F (36.6 C) (Oral)  Resp 16  Ht 5\' 9"  (1.753 m)  Wt 65.772 kg (145 lb)  BMI 21.40 kg/m2  SpO2 99%  Labs showed potassium of 3.3, leukocytosis 15.9, hemoglobin 11.7  CT abdomen and pelvis showed enteritis/colitis  EDP ordered C. Difficile  Accepted to [facility] med surg bed.   Donald Fry M.D. [facility] 08/29/2014, 7:26 AM  Pager: [phone]

## 2014-08-29 NOTE — ED Notes (Addendum)
Pt states that he is currently being treated for colon cancer. States first chemo treatment was on Wednesday. Pt. C/o lower mid abd pain that started last night around 8pm. Describes as sharp and states pain comes and goes. C/o n/v/d. Denies fevers. Denies any urinary symptoms. Denies any sob. States he has had 5 episodes of emesis since last night.

## 2014-08-29 NOTE — H&P (Signed)
History and Physical       Hospital Admission Note Date: 08/29/2014  Patient name: ALIX STOWERS Medical record number: [id number] Date of birth: 05/23/1956 Age: 59 y.o. Gender: male  PCP: [name], MD    Chief Complaint:  Nausea, vomiting, diarrhea for last 2 days  HPI: Patient is a 59 year old male with hyperlipidemia, anemia, hypertension, adenocarcinoma colon, recently diagnosed in 07/2014, status post exploratory laparotomy, right colectomy, started chemotherapy last week, was just discharged on 3/14 presented to [facility] [place] ED with above complaints. The patient reported that 2 days ago he started having intermittent abdominal pain with vomiting and diarrhea. Denies any fevers or chills, reported abdominal pain as intermittent crampy pain. Patient reports generalized weakness and fatigue. Denies any hematochezia or melena. Had about 5-6 episodes of both vomiting, no hematemesis. Patient reported as diarrhea, watery greenish in color.   ED workup showed leukocytosis white count of 15.9 (patient is also on prednisone prior to admission), hypokalemia 3.3, CT abdomen and pelvis showed enteritis/colitis  Review of Systems:  Constitutional: Denies fever, chills, diaphoresis,+ poor appetite and fatigue.  HEENT: Denies photophobia, eye pain, redness, hearing loss, ear pain, congestion, sore throat, rhinorrhea, sneezing, mouth sores, trouble swallowing, neck pain, neck stiffness and tinnitus.   Respiratory: Denies SOB, DOE, cough, chest tightness,  and wheezing.   Cardiovascular: Denies chest pain, palpitations and leg swelling.  Gastrointestinal: Please see history of present illness  Genitourinary: Denies dysuria, urgency, frequency, hematuria, flank pain and difficulty urinating.  Musculoskeletal: Denies myalgias, back pain, joint swelling, arthralgias and gait problem.  Skin: Denies pallor, rash and wound.  Neurological:  Denies seizures, syncope, numbness and headaches. + dizziness, lightheadedness and generalized weakness  Hematological: Denies adenopathy. Easy bruising, personal or family bleeding history  Psychiatric/Behavioral: Denies suicidal ideation, mood changes, confusion, nervousness, sleep disturbance and agitation  Past Medical History: Past Medical History  Diagnosis Date  . Hypertension   . High cholesterol   . Anxiety   . Colon cancer 07/30/2014  . Colon cancer metastasized to multiple sites 08/20/2014   Past Surgical History  Procedure Laterality Date  . Colonoscopy N/A 07/29/2014    Procedure: COLONOSCOPY;  Surgeon: Ladene Artist, MD;  Location: [facility];  Service: Endoscopy;  Laterality: N/A;  . Laparoscopic partial colectomy N/A 07/30/2014    Procedure: LAPAROSCOPY CONVERTED TO EXPLORATORY LAPAROTOMY EXTENDED RIGHT COLECTOMY BIOPSY OF DIAPHRAGMATIC IMPLANT CLOSURE OF DIAPHRAGM CHOLECYSTECTOMY;  Surgeon: Jackolyn Confer, MD;  Location: [facility] OR[facility];  Service: General;  Laterality: N/A;  . Cholecystectomy N/A 07/30/2014    Procedure: LAPAROSCOPIC CHOLECYSTECTOMY WITH INTRAOPERATIVE CHOLANGIOGRAM;  Surgeon: Jackolyn Confer, MD;  Location: [facility] OR[facility];  Service: General;  Laterality: N/A;  . Ercp N/A 08/06/2014    Procedure: ENDOSCOPIC RETROGRADE CHOLANGIOPANCREATOGRAPHY (ERCP);  Surgeon: Inda Castle, MD;  Location: [facility];  Service: Endoscopy;  Laterality: N/A;  . Portacath placement Right 08/16/2014    Procedure: INSERTION PORT-A-CATH;  Surgeon: Excell Seltzer, MD;  Location: [facility];  Service: General;  Laterality: Right;  port placement right subclavian    Medications: Prior to Admission medications   Medication Sig Start Date End Date Taking? Authorizing Provider  ALPRAZolam Duanne Moron) 1 MG tablet Take 1 mg by mouth 4 (four) times daily as needed.     Historical Provider, MD  aspirin 81 MG tablet Take 81 mg by mouth daily.    Historical Provider, MD  atenolol (TENORMIN) 100 MG tablet  Take 100 mg by mouth daily.    Historical Provider, MD  dexamethasone (DECADRON) 4 MG tablet Take 2 tablets (8 mg total) by mouth 2 (two) times daily with a meal. Start the day after chemotherapy for 3 days. Take with food. Patient not taking: Reported on 08/25/2014 08/24/14   Volanda Napoleon, MD  feeding supplement, ENSURE, (ENSURE) PUDG Take 1 Container by mouth 3 (three) times daily between meals. 08/16/14   Venetia Maxon Rama, MD  lidocaine-prilocaine (EMLA) cream Apply to affected area once 08/24/14   Volanda Napoleon, MD  loperamide (IMODIUM A-D) 2 MG tablet Take 2 at onset of diarrhea, then 1 every 2hrs until 12hr without a BM. May take 2 tab every 4hrs at bedtime. If diarrhea recurs repeat. 08/24/14   Volanda Napoleon, MD  LORazepam (ATIVAN) 0.5 MG tablet Take 1 tablet (0.5 mg total) by mouth every 6 (six) hours as needed for anxiety. 08/24/14   Volanda Napoleon, MD  lovastatin (MEVACOR) 40 MG tablet Take 40 mg by mouth at bedtime.    Historical Provider, MD  megestrol (MEGACE) 400 MG/10ML suspension Take 15 mLs (600 mg total) by mouth daily. 08/27/14   Volanda Napoleon, MD  mirtazapine (REMERON) 15 MG tablet Take 0.5-1 tablets (7.5-15 mg total) by mouth at bedtime. 08/16/14   Venetia Maxon Rama, MD  multivitamin-iron-minerals-folic acid (CENTRUM) chewable tablet Chew 1 tablet by mouth daily. 07/25/14   Carmin Muskrat, MD  ondansetron (ZOFRAN) 8 MG tablet Take 1 tablet (8 mg total) by mouth 2 (two) times daily. Start the day after chemo for 3 days. Then take as needed for nausea or vomiting. 08/24/14   Volanda Napoleon, MD  predniSONE (DELTASONE) 20 MG tablet Take 1 tablet (20 mg total) by mouth daily with breakfast. 08/23/14   Volanda Napoleon, MD  prochlorperazine (COMPAZINE) 10 MG tablet Take 1 tablet (10 mg total) by mouth every 6 (six) hours as needed (Nausea or vomiting). 08/24/14   Volanda Napoleon, MD  senna (SENOKOT) 8.6 MG TABS tablet Take 1 tablet (8.6 mg total) by mouth daily as needed for mild  constipation. 08/16/14   Christina P Rama, MD  temazepam (RESTORIL) 15 MG capsule Take 1 capsule (15 mg total) by mouth at bedtime as needed for sleep. 08/20/14   Volanda Napoleon, MD  traMADol (ULTRAM) 50 MG tablet Take 1 tablet (50 mg total) by mouth every 6 (six) hours as needed for severe pain. 08/27/14   Volanda Napoleon, MD    Allergies:  No Known Allergies  Social History:  reports that he quit smoking about 4 weeks ago. His smoking use included Cigarettes. He has a 30 pack-year smoking history. He has never used smokeless tobacco. He reports that he does not drink alcohol or use illicit drugs.  Family History: No family history on file.  Physical Exam: Blood pressure 115/65, pulse 63, temperature 98 F (36.7 C), temperature source Oral, resp. rate 16, height 5\' 9"  (1.753 m), weight 65.772 kg (145 lb), SpO2 98 %. General: Alert, awake, oriented x3, in no acute distress. HEENT: normocephalic, atraumatic, anicteric sclera, pink conjunctiva, pupils equal and reactive to light and accomodation, oropharynx clear Neck: supple, no masses or lymphadenopathy, no goiter, no bruits  Heart: Regular rate and rhythm, without murmurs, rubs or gallops. Lungs: Clear to auscultation bilaterally, no wheezing, rales or rhonchi. Abdomen: Soft,  mild tenderness in the left lower quadrant , nondistended, positive bowel sounds, no masses. Extremities: No clubbing, cyanosis or edema with positive pedal pulses. Neuro: Grossly intact, no focal neurological deficits, strength  5/5 upper and lower extremities bilaterally Psych: alert and oriented x 3, normal mood and affect Skin: no rashes or lesions, warm and dry   LABS on Admission:  Basic Metabolic Panel:  Recent Labs Lab 08/29/14 0400  NA 141  K 3.3*  CL 103  CO2 26  GLUCOSE 139*  BUN 22  CREATININE 0.71  CALCIUM 9.4   Liver Function Tests:  Recent Labs Lab 08/29/14 0400  AST 17  ALT 14  ALKPHOS 84  BILITOT 0.4  PROT 7.4  ALBUMIN 3.4*     Recent Labs Lab 08/29/14 0400  LIPASE 27   No results for input(s): AMMONIA in the last 168 hours. CBC:  Recent Labs Lab 08/29/14 0400  WBC 15.9*  NEUTROABS 13.3*  HGB 11.7*  HCT 38.9*  MCV 75.7*  PLT 447*   Cardiac Enzymes: No results for input(s): CKTOTAL, CKMB, CKMBINDEX, TROPONINI in the last 168 hours. BNP: Invalid input(s): POCBNP CBG: No results for input(s): GLUCAP in the last 168 hours.   Radiological Exams on Admission: X-ray Chest Pa Or Ap  08/16/2014   CLINICAL DATA:  Port-A-Cath placement.  EXAM: CHEST  1 VIEW  COMPARISON:  Single view of the chest 08/16/2014.  FINDINGS: New right subclavian approach Port-A-Cath is in place with tip projecting at the superior cavoatrial junction. No pneumothorax is seen. Small to moderate bilateral pleural effusions, greater on the right, with associated basilar atelectasis persist. There is mild interstitial edema.  IMPRESSION: Tip of Port-A-Cath projects at the superior cavoatrial junction. Negative for pneumothorax.  Small to moderate bilateral pleural effusions, greater on the right, with basilar atelectasis.  Mild interstitial edema.   Electronically Signed   By: Inge Rise M.D.   On: 08/16/2014 15:32   Dg Chest 2 View  08/08/2014   CLINICAL DATA:  59 year old male with shortness of breath, tachypnea and weakness for several months. Right colectomy on 07/30/2014 for colon cancer.  EXAM: CHEST  2 VIEW  COMPARISON:  08/01/2014 and prior radiographs dating back to 07/25/2014  FINDINGS: Cardiomediastinal silhouette is unchanged.  Decreased bilateral interstitial and airspace opacities noted.  Bibasilar opacities again noted, improved on the right.  This is a low volume film.  There is no evidence of pneumothorax or acute bony abnormality.  Small bilateral pleural effusions are again identified.  IMPRESSION: Decreased bilateral interstitial and airspace opacities which may represent improved edema or infection.  Bibasilar  opacities again noted, improved on the right.  Small bilateral pleural effusions again identified.   Electronically Signed   By: Margarette Canada M.D.   On: 08/08/2014 17:11   Nm Hepatobiliary Including Gb  08/02/2014   CLINICAL DATA:  Status post cholecystectomy 07/30/2014  EXAM: NUCLEAR MEDICINE HEPATOBILIARY IMAGING  TECHNIQUE: Sequential images of the abdomen were obtained out to 60 minutes following intravenous administration of radiopharmaceutical.  RADIOPHARMACEUTICALS:  5.5 Millicurie [id number] Choletec  COMPARISON:  None.  FINDINGS: Flow study shows uniform distribution of radioactivity in the liver. Sequential imaging of the liver and abdomen over a period of 60 minutes shows uniform uptake of the tracer in the liver.  Prior cholecystectomy. Filling of the common bile duct. Radiotracer uptake is present in the small bowel at 20 minutes. There is abnormal radiotracer collection adjacent to the inferior margin of the left hepatic lobe most concerning for a bile leak.  At the end of 1-hour, there is relatively poor clearance of activity from the liver.  IMPRESSION: 1. Abnormal radiotracer collection adjacent to the inferior margin of the  left hepatic lobe most concerning for a bile leak. These results were called by telephone at the time of interpretation on 08/02/2014 at 12:01 pm to Dr. Jacquelynn Cree , who verbally acknowledged these results.   Electronically Signed   By: Kathreen Devoid   On: 08/02/2014 12:18   Ct Abdomen Pelvis W Contrast  08/29/2014   CLINICAL DATA:  Lower abdominal pain for 10 hours. Emesis. History of colon cancer, currently treating with chemotherapy.  EXAM: CT ABDOMEN AND PELVIS WITH CONTRAST  TECHNIQUE: Multidetector CT imaging of the abdomen and pelvis was performed using the standard protocol following bolus administration of intravenous contrast.  CONTRAST:  106mL OMNIPAQUE IOHEXOL 300 MG/ML SOLN, 170mL OMNIPAQUE IOHEXOL 300 MG/ML SOLN  COMPARISON:  CT 08/10/2014  FINDINGS: Decreased  pleural effusions from prior, small right pleural effusion persists. Near complete resolution of previous parenchymal opacities. There is a subpleural nodule in the right middle lobe measuring 4 mm.  Multiple hypodense liver lesions in the right and left hepatic lobe, largest measuring 14 mm, unchanged. Unchanged linear hypodensity in the right hepatic lobe. Clips in the gallbladder fossa from cholecystectomy. Biliary stent remains in place.  The spleen, adrenal glands, and kidneys are normal.  The stomach is distended with fluid. Fluid-filled normal caliber small bowel loops throughout the abdomen. There is bowel thickening of small bowel loops in the right lower abdomen. Ileal colonic anastomosis appears normal. There is fluid within the transverse and proximal descending colon. Patient is post right hemicolectomy. The previous postoperative drain is no longer seen.  Small amount of fluid adjacent to the liver. Decreased fluid in the pelvis with residual small volume of simple free fluid. No free intra-abdominal air.  The abdominal aorta is normal in caliber with dense atherosclerosis.  No intra-abdominal fluid collection.  Urinary bladder is physiologically distended. Prostate gland is normal in size.  Degenerative change in the lower lumbar spine. No focal osseous abnormality.  IMPRESSION: 1. Fluid-filled normal caliber small bowel loops, with wall thickening involving right lower quadrant small bowel. Fluid within the transverse and descending colon. Findings suggest enteritis/colitis, infectious or inflammatory in etiology. There is no obstruction. No free air or fluid collection. 2. Decreased free fluid, small amount of free fluid persists in the pelvis. 3. Decreased pleural effusions, small right pleural effusion persists. Resolution of previous patchy opacities.   Electronically Signed   By: Jeb Levering M.D.   On: 08/29/2014 06:32   Ct Abdomen Pelvis W Contrast  08/11/2014   ADDENDUM REPORT:  08/11/2014 08:32  ADDENDUM: Study discussed by telephone with Dr. Shanon Brow TAT on 08/11/2014 at 0816 hours.   Electronically Signed   By: Genevie Ann M.D.   On: 08/11/2014 08:32   08/11/2014   CLINICAL DATA:  59 year old male with lower GI bleed, lower abdominal pain. Current history of colon cancer resection 07/30/2014. Postoperative bile leak and pneumonia. Initial encounter.  EXAM: CT ABDOMEN AND PELVIS WITH CONTRAST  TECHNIQUE: Multidetector CT imaging of the abdomen and pelvis was performed using the standard protocol following bolus administration of intravenous contrast.  CONTRAST:  154mL OMNIPAQUE IOHEXOL 300 MG/ML  SOLN  COMPARISON:  Preoperative CT Abdomen and Pelvis 07/28/2014  FINDINGS: New moderate right greater than left layering pleural effusions. New bilateral lower lobe consolidation and compressive atelectasis. Patchy opacity in the lingula and right middle lobe are new. No pericardial effusion.  No acute osseous abnormality identified.  Small to moderate volume of pelvic free fluid with simple fluid densitometry. Unremarkable bladder. Mild presacral  stranding. Negative distal colon.  Mild stranding in both pericolic gutters. Negative splenic flexure. Residual transverse colon with retained stool. Small to large bowel anastomosis at the level of the mid transverse with surgical absence of the more proximal colon. Overlying midline ventral abdominal wall incision with skin staples. Right abdominal percutaneous postoperative drain which terminates near the anastomosis. The gallbladder also now is surgically absent. CBD stent in place terminating in the duodenum. Small volume perihepatic free fluid.  Small round hypodense areas in the left and right hepatic lobes are stable. There is a new linear area of hypodensity along the periphery of the right lobe which may be postoperative in nature. Portal venous system is patent. Major arterial structures are patent. Aortoiliac calcified atherosclerosis noted.  The  spleen is within normal limits. There is confluent stranding in the lesser sac nearest the pancreas and celiac vasculature. Pancreatic enhancement is preserved. No focal abnormality of the stomach. Stranding continues into the root of the small bowel mesentery. Oral contrast has reached the distal small bowel.  Negative adrenal glands. Renal enhancement and contrast excretion within normal limits. No pneumoperitoneum identified.  IMPRESSION: 1. Inflammatory stranding at the celiac axis and root of the mesentery, favor due to acute pancreatitis. No pancreatic necrosis or organized fluid collection identified. CBD stent in place. 2. Postoperative changes from right hemicolectomy and cholecystectomy for treatment of hepatic flexure poorly differentiated adenocarcinoma. No evidence of bowel obstruction. Postoperative drain in place with small volume perihepatic and small to moderate volume pelvic free fluid which appears simple in nature. 3. Moderate pleural effusions with new lower lobe consolidation and bilateral middle lobe patchy opacity suspicious for pneumonia.  Electronically Signed: By: Genevie Ann M.D. On: 08/11/2014 08:07   Dg Chest Port 1 View  08/16/2014   CLINICAL DATA:  Bilateral pleural effusion  EXAM: PORTABLE CHEST - 1 VIEW  COMPARISON:  08/08/2014  FINDINGS: Diffuse interstitial thickening is present, worsened from 08/08/2014 and likely representing interstitial fluid. Mild airspace opacity is also present in the central and basilar regions. The findings most likely represent congestive heart failure. Heart size appears unchanged. No large effusions are evident but there probably is at least a small right effusion.  IMPRESSION: Probable congestive heart failure.   Electronically Signed   By: Andreas Newport M.D.   On: 08/16/2014 06:11   Dg Chest Port 1 View  08/01/2014   CLINICAL DATA:  Postoperative bile leak.  Hypertension.  EXAM: PORTABLE CHEST - 1 VIEW  COMPARISON:  July 30, 2014.   FINDINGS: Stable cardiomediastinal silhouette. Nasogastric tube is seen entering stomach. Right internal jugular catheter is stable in position with tip overlying expected position of the SVC. No pneumothorax is noted. Increased left perihilar opacity is noted concerning for pneumonia. Significantly increased opacity is noted in the right middle lobe concerning for pneumonia. Right upper lobe opacity is also noted concerning for pneumonia. No significant pleural effusion is noted. Bony thorax is intact.  IMPRESSION: Increased bilateral upper lobe and right middle lobe opacities are noted most consistent with pneumonia or possibly edema. Followup radiographs are recommended.   Electronically Signed   By: Marijo Conception, M.D.   On: 08/01/2014 10:35   Dg Chest Port 1 View  07/30/2014   CLINICAL DATA:  Evaluate PICC line placement  EXAM: PORTABLE CHEST - 1 VIEW  COMPARISON:  07/25/2014  FINDINGS: Low lung volumes. Mild right basilar atelectasis. No focal consolidation. No pleural effusion or pneumothorax.  The heart is normal in size.  Right IJ venous catheter terminates in the mid SVC.  Enteric tube courses into the proximal stomach.  Cholecystectomy clips.  IMPRESSION: Right IJ venous catheter terminates in the mid SVC. No pneumothorax.   Electronically Signed   By: Julian Hy M.D.   On: 07/30/2014 18:34   Dg Ercp  08/06/2014   CLINICAL DATA:  Bile leak  EXAM: ERCP  TECHNIQUE: Multiple spot images obtained with the fluoroscopic device and submitted for interpretation post-procedure.  COMPARISON:  None.  FINDINGS: Images demonstrate cannulation of the common bile duct and contrast filling the biliary tree. A biliary stent is present across the distal common bile duct.  IMPRESSION: Biliary stent placement.  These images were submitted for radiologic interpretation only. Please see the procedural report for the amount of contrast and the fluoroscopy time utilized.   Electronically Signed   By: Marybelle Killings  M.D.   On: 08/06/2014 12:46   Dg C-arm 1-60 Min-no Report  08/16/2014   CLINICAL DATA: port a cath   C-ARM 1-60 MINUTES  Fluoroscopy was utilized by the requesting physician.  No radiographic  interpretation.     Assessment/Plan Principal Problem:   Colitis - CT abdomen and pelvis consistent with enteritis, colitis - Obtain C. difficile PCR, GI pathogen panel, placed on aggressive IV fluid hydration, clear liquid diet and advance as tolerated, antiemetics - Placed on IV Flagyl for now, if C. difficile negative, will add ciprofloxacin - Patient has already received 1 dose of ciprofloxacin in the ED at [facility] - PT OT evaluation once stable  Active Problems:   Essential hypertension - BP currently stable, on lower side, hold off on atenolol for now    Hyperlipidemia - Continue statins    Hypokalemia - Replaced    Colon cancer metastasized to multiple sites - Patient started chemotherapy last week, oncology team, Dr Marin Olp added in care teams    Dehydration - Continue IV fluid hydration   DVT prophylaxis:  Lovenox   CODE STATUS:  full code   Family Communication: Admission, patients condition and plan of care including tests being ordered have been discussed with the patient and and family member who indicates understanding and agree with the plan and Code Status   Further plan will depend as patient's clinical course evolves and further radiologic and laboratory data become available.   Time Spent on Admission: 44mins  Michell Kader M.D. [facility] 08/29/2014, 9:37 AM Pager: [phone]  If 7PM-7AM, please contact night-coverage [email or web address] Password [email or web address]

## 2014-08-29 NOTE — ED Notes (Signed)
Returned from CT scan.

## 2014-08-30 DIAGNOSIS — R109 Unspecified abdominal pain: Secondary | ICD-10-CM

## 2014-08-30 DIAGNOSIS — D649 Anemia, unspecified: Secondary | ICD-10-CM

## 2014-08-30 DIAGNOSIS — C184 Malignant neoplasm of transverse colon: Secondary | ICD-10-CM

## 2014-08-30 DIAGNOSIS — E46 Unspecified protein-calorie malnutrition: Secondary | ICD-10-CM

## 2014-08-30 DIAGNOSIS — R112 Nausea with vomiting, unspecified: Secondary | ICD-10-CM

## 2014-08-30 LAB — CBC
HCT: 29.4 % — ABNORMAL LOW (ref 39.0–52.0)
HEMOGLOBIN: 8.8 g/dL — AB (ref 13.0–17.0)
MCH: 22.9 pg — ABNORMAL LOW (ref 26.0–34.0)
MCHC: 29.9 g/dL — AB (ref 30.0–36.0)
MCV: 76.6 fL — ABNORMAL LOW (ref 78.0–100.0)
Platelets: 302 10*3/uL (ref 150–400)
RBC: 3.84 MIL/uL — ABNORMAL LOW (ref 4.22–5.81)
RDW: 20.7 % — ABNORMAL HIGH (ref 11.5–15.5)
WBC: 6 10*3/uL (ref 4.0–10.5)

## 2014-08-30 LAB — BASIC METABOLIC PANEL
Anion gap: 7 (ref 5–15)
BUN: 10 mg/dL (ref 6–23)
CALCIUM: 8.5 mg/dL (ref 8.4–10.5)
CO2: 24 mmol/L (ref 19–32)
Chloride: 106 mmol/L (ref 96–112)
Creatinine, Ser: 0.5 mg/dL (ref 0.50–1.35)
GFR calc non Af Amer: >90 mL/min (ref >90)
GLUCOSE: 115 mg/dL — AB (ref 70–99)
Potassium: 3.8 mmol/L (ref 3.5–5.1)
Sodium: 137 mmol/L (ref 135–145)

## 2014-08-30 MED ORDER — PREDNISONE 10 MG PO TABS
10.0000 mg | ORAL_TABLET | Freq: Every day | ORAL | Status: DC
Start: 2014-08-31 — End: 2014-09-01
  Administered 2014-08-31 – 2014-09-01 (×2): 10 mg via ORAL
  Filled 2014-08-30 (×3): qty 1

## 2014-08-30 MED ORDER — BISACODYL 10 MG RE SUPP
10.0000 mg | Freq: Once | RECTAL | Status: AC
  Administered 2014-08-30: 10 mg via RECTAL
  Filled 2014-08-30: qty 1

## 2014-08-30 MED ORDER — FAMOTIDINE 40 MG PO TABS
40.0000 mg | ORAL_TABLET | Freq: Two times a day (BID) | ORAL | Status: DC
  Administered 2014-08-30 – 2014-09-01 (×4): 40 mg via ORAL
  Filled 2014-08-30 (×6): qty 1

## 2014-08-30 MED ORDER — CETYLPYRIDINIUM CHLORIDE 0.05 % MT LIQD
7.0000 mL | Freq: Two times a day (BID) | OROMUCOSAL | Status: DC
  Administered 2014-08-30 – 2014-08-31 (×2): 7 mL via OROMUCOSAL

## 2014-08-30 MED ORDER — SODIUM CHLORIDE 0.9 % IJ SOLN
10.0000 mL | INTRAMUSCULAR | Status: DC | PRN
  Administered 2014-08-31 – 2014-09-01 (×3): 10 mL
  Filled 2014-08-30 (×3): qty 40

## 2014-08-30 NOTE — Care Management Note (Signed)
    Page 1 of 1   08/30/2014     1:42:59 PM CARE MANAGEMENT NOTE 08/30/2014  Patient:  Donald Fry, Donald Fry   Account Number:  [id number]  Date Initiated:  08/30/2014  Documentation initiated by:  Sunday Spillers  Subjective/Objective Assessment:   59 yo male admitted with abd pain, n/v/d. PTA lived at home.     Action/Plan:   Home when stable   Anticipated DC Date:  09/02/2014   Anticipated DC Plan:  [place]  CM consult      Choice offered to / List presented to:             Status of service:  Completed, signed off Medicare Important Message given?   (If response is "NO", the following Medicare IM given date fields will be blank) Date Medicare IM given:   Medicare IM given by:   Date Additional Medicare IM given:   Additional Medicare IM given by:    Discharge Disposition:  HOME/SELF CARE  Per UR Regulation:  Reviewed for med. necessity/level of care/duration of stay  If discussed at [place] of Stay Meetings, dates discussed:    Comments:

## 2014-08-30 NOTE — Progress Notes (Signed)
INITIAL NUTRITION ASSESSMENT  DOCUMENTATION CODES Per approved criteria  -Severe malnutrition in the context of acute illness or injury  Pt meets criteria for severe MALNUTRITION in the context of acute illness/injury as evidenced by moderate fat and muscle depletion and 17% wt loss in <3 months.  INTERVENTION: - Carnation Instant Breakfast TID, each supplement provides 130 kcal and 5 g protein. Mix with 8 oz whole milk to provide additional 150 kcal and 8 g protein (280 kcal, 13 g protein total) - RD will continue to monitor  NUTRITION DIAGNOSIS: Inadequate oral intake related to nausea/vomiting/diarrhea as evidenced by poor po and wt loss.   Goal: Pt to meet >/= 90% of their estimated nutrition needs   Monitor:  Weight trend, po intake, acceptance of supplements, labs  Reason for Assessment: Malnutrition Screening Tool  59 y.o. male  Admitting Dx: Colitis  ASSESSMENT: 59 year old male with hyperlipidemia, anemia, hypertension, adenocarcinoma colon, recently diagnosed in 07/2014, status post exploratory laparotomy, right colectomy, started chemotherapy last week, was just discharged on 3/14 presented to [facility] with nausea, vomiting, diarrhea for last 2 days.   - Pt reports a 28 lb wt loss in ~5 weeks (17%- significant for time frame.) Wt loss began during last hospitalization.  - Pt reports that his appetite has been improving due to addition of medication (possibly Megace?) until he began having current symptoms. He ate a cinnamon roll this morning from Mrs. Winners and tolerated it.  - Pt had just had a suppository prior to RD visit and did not feel like he needed to have a bowel movement yet.  - Pt does not like Ensure, [employer], or Boost supplements. Will order [employer] Essentials for pt to try.  - Labs reviewed K low  Nutrition Focused Physical Exam:  Subcutaneous Fat:  Orbital Region: moderate depletion Upper Arm Region: moderate  depletion Thoracic and Lumbar Region: n/a  Muscle:  Temple Region: severe depletion Clavicle Bone Region: moderate depletion Clavicle and Acromion Bone Region: moderate depletion Scapular Bone Region: n/a Dorsal Hand: moderate depletion Patellar Region: mild to moderate depletion Anterior Thigh Region: mild to moderate depletion Posterior Calf Region: mild to moderate depletion  Edema: none  Height: Ht Readings from Last 1 Encounters:  08/29/14 5\' 9"  (1.753 m)    Weight: Wt Readings from Last 1 Encounters:  08/29/14 137 lb 3.2 oz (62.234 kg)    Ideal Body Weight: 70.7 kg  % Ideal Body Weight: 88%  Wt Readings from Last 10 Encounters:  08/29/14 137 lb 3.2 oz (62.234 kg)  08/20/14 145 lb (65.772 kg)  08/15/14 157 lb 4.8 oz (71.351 kg)  07/25/14 165 lb (74.844 kg)    Usual Body Weight: 165 lbs  % Usual Body Weight: 83%  BMI:  Body mass index is 20.25 kg/(m^2).  Estimated Nutritional Needs: Kcal: 1800-2000 Protein: 90-100 g Fluid: 2.0 L/day  Skin: closed incision on chest  Diet Order: Diet regular Room service appropriate?: Yes; Fluid consistency:: Thin  EDUCATION NEEDS: -Education needs addressed   Intake/Output Summary (Last 24 hours) at 08/30/14 1047 Last data filed at 08/30/14 0809  Gross per 24 hour  Intake 3188.33 ml  Output   1500 ml  Net 1688.33 ml    Last BM: prior to admission; on bowel regimen  Labs:   Recent Labs Lab 08/29/14 0400 08/29/14 1050 08/30/14 0450  NA 141  --  137  K 3.3*  --  3.8  CL 103  --  106  CO2 26  --  24  BUN 22  --  10  CREATININE 0.71 0.56 0.50  CALCIUM 9.4  --  8.5  GLUCOSE 139*  --  115*    CBG (last 3)  No results for input(s): GLUCAP in the last 72 hours.  Scheduled Meds: . antiseptic oral rinse  7 mL Mouth Rinse BID  . aspirin EC  81 mg Oral Daily  . enoxaparin (LOVENOX) injection  40 mg Subcutaneous Q24H  . feeding supplement (ENSURE ENLIVE)  237 mL Oral BID BM  . feeding supplement  (RESOURCE BREEZE)  1 Container Oral TID BM  . megestrol  600 mg Oral Daily  . metronidazole  500 mg Intravenous 3 times per day  . mirtazapine  7.5-15 mg Oral QHS  . pravastatin  40 mg Oral q1800  . predniSONE  20 mg Oral Q breakfast    Continuous Infusions: . sodium chloride 75 mL/hr at 08/30/14 5320    Past Medical History  Diagnosis Date  . Hypertension   . High cholesterol   . Anxiety   . Colon cancer 07/30/2014  . Colon cancer metastasized to multiple sites 08/20/2014    Past Surgical History  Procedure Laterality Date  . Colonoscopy N/A 07/29/2014    Procedure: COLONOSCOPY;  Surgeon: Ladene Artist, MD;  Location: [facility];  Service: Endoscopy;  Laterality: N/A;  . Laparoscopic partial colectomy N/A 07/30/2014    Procedure: LAPAROSCOPY CONVERTED TO EXPLORATORY LAPAROTOMY EXTENDED RIGHT COLECTOMY BIOPSY OF DIAPHRAGMATIC IMPLANT CLOSURE OF DIAPHRAGM CHOLECYSTECTOMY;  Surgeon: Jackolyn Confer, MD;  Location: [facility];  Service: General;  Laterality: N/A;  . Cholecystectomy N/A 07/30/2014    Procedure: LAPAROSCOPIC CHOLECYSTECTOMY WITH INTRAOPERATIVE CHOLANGIOGRAM;  Surgeon: Jackolyn Confer, MD;  Location: [facility];  Service: General;  Laterality: N/A;  . Ercp N/A 08/06/2014    Procedure: ENDOSCOPIC RETROGRADE CHOLANGIOPANCREATOGRAPHY (ERCP);  Surgeon: Inda Castle, MD;  Location: [facility];  Service: Endoscopy;  Laterality: N/A;  . Portacath placement Right 08/16/2014    Procedure: INSERTION PORT-A-CATH;  Surgeon: Excell Seltzer, MD;  Location: [facility];  Service: General;  Laterality: Right;  port placement right subclavian    Laurette Schimke [place], [place], [place]

## 2014-08-30 NOTE — Progress Notes (Signed)
Donald Fry   DOB:06-26-1955   ZH#:[id number]   GEX#:[id number]  Patient Care Team: [name], MD as PCP - General (Family Medicine) Volanda Napoleon, MD as Consulting Physician (Oncology)  Subjective: Patient seen and examined. He presented on 3/27 with 2 day history of nausea, vomiting and watery  diarrhea with abdominal cramping.Denies fevers, chills, night sweats, vision changes, or mucositis. Denies any respiratory complaints. Denies any chest pain or palpitations. Denies lower extremity swelling. He was admitted for evaluation and management. He received IV fluids. C difficile is pending. Today he denies nausea, heartburn. Diarrhea has improved. Appetite decreased. Denies any dysuria. Denies abnormal skin rashes, or neuropathy. Denies any bleeding issues such as epistaxis, hematemesis, hematuria or hematochezia. He is very tired. We were informed of the patient's admission.  Principle Diagnosis:   Metastatic colon cancer- KRAS wild type  Current Therapy:   Status post resection of primary tumor with biopsy of diaphragmatic met.        S/p cycle 1 chemotherapy on 3/23 with Camptosar/ Oxaliplatin/ Adrucil  Scheduled Meds: . antiseptic oral rinse  7 mL Mouth Rinse BID  . aspirin EC  81 mg Oral Daily  . bisacodyl  10 mg Rectal Once  . enoxaparin (LOVENOX) injection  40 mg Subcutaneous Q24H  . feeding supplement (ENSURE ENLIVE)  237 mL Oral BID BM  . feeding supplement (RESOURCE BREEZE)  1 Container Oral TID BM  . megestrol  600 mg Oral Daily  . metronidazole  500 mg Intravenous 3 times per day  . mirtazapine  7.5-15 mg Oral QHS  . pravastatin  40 mg Oral q1800  . predniSONE  20 mg Oral Q breakfast   Continuous Infusions: . sodium chloride 100 mL/hr at 08/30/14 0509   PRN Meds:acetaminophen **OR** acetaminophen, ALPRAZolam, HYDROcodone-acetaminophen, HYDROmorphone (DILAUDID) injection, ondansetron **OR** ondansetron (ZOFRAN) IV, temazepam   Objective:  Filed Vitals:    08/30/14 0631  BP: 126/68  Pulse: 58  Temp: 97.8 F (36.6 C)  Resp: 16      Intake/Output Summary (Last 24 hours) at 08/30/14 0912 Last data filed at 08/30/14 0809  Gross per 24 hour  Intake 3503.33 ml  Output   1500 ml  Net 2003.33 ml    ECOG PERFORMANCE STATUS:1  GENERAL:alert, no distress and comfortable SKIN: skin color, texture, turgor are normal, no rashes or significant lesions EYES: normal, conjunctiva are pink and non-injected, sclera clear OROPHARYNX:no exudate, no erythema and lips, buccal mucosa, and tongue normal  NECK: supple, thyroid normal size, non-tender, without nodularity LYMPH:  no palpable lymphadenopathy in the cervical, axillary or inguinal LUNGS: clear to auscultation and percussion with normal breathing effort HEART: regular rate & rhythm and no murmurs and no lower extremity edema ABDOMEN: soft,mildly tender at the left lower quadrant, and normal bowel sounds. Well healed abdominal scar. Musculoskeletal:no cyanosis of digits and no clubbing  PSYCH: alert & oriented x 3 with fluent speech NEURO: no focal motor/sensory deficits    CBG (last 3)  No results for input(s): GLUCAP in the last 72 hours.   Labs:   Recent Labs Lab 08/29/14 0400 08/29/14 1050 08/30/14 0450  WBC 15.9* 10.3 6.0  HGB 11.7* 10.1* 8.8*  HCT 38.9* 34.3* 29.4*  PLT 447* 400 302  MCV 75.7* 77.4* 76.6*  MCH 22.8* 22.8* 22.9*  MCHC 30.1 29.4* 29.9*  RDW 22.3* 20.9* 20.7*  LYMPHSABS 1.7  --   --   MONOABS 0.3  --   --   EOSABS 0.6  --   --  BASOSABS 0.0  --   --      Chemistries:    Recent Labs Lab 08/29/14 0400 08/29/14 1050 08/30/14 0450  NA 141  --  137  K 3.3*  --  3.8  CL 103  --  106  CO2 26  --  24  GLUCOSE 139*  --  115*  BUN 22  --  10  CREATININE 0.71 0.56 0.50  CALCIUM 9.4  --  8.5  AST 17  --   --   ALT 14  --   --   ALKPHOS 84  --   --   BILITOT 0.4  --   --     GFR Estimated Creatinine Clearance: 86.4 mL/min (by C-G formula based on  Cr of 0.5).  Liver Function Tests:  Recent Labs Lab 08/29/14 0400  AST 17  ALT 14  ALKPHOS 84  BILITOT 0.4  PROT 7.4  ALBUMIN 3.4*    Recent Labs Lab 08/29/14 0400  LIPASE 27   No results for input(s): AMMONIA in the last 168 hours.  Urine Studies     Component Value Date/Time   COLORURINE YELLOW 08/29/2014 0642   APPEARANCEUR CLEAR 08/29/2014 0642   LABSPEC >1.046* 08/29/2014 0642   PHURINE 6.0 08/29/2014 0642   GLUCOSEU NEGATIVE 08/29/2014 0642   HGBUR NEGATIVE 08/29/2014 0642   BILIRUBINUR NEGATIVE 08/29/2014 0642   KETONESUR NEGATIVE 08/29/2014 0642   PROTEINUR NEGATIVE 08/29/2014 0642   UROBILINOGEN 0.2 08/29/2014 0642   NITRITE NEGATIVE 08/29/2014 0642   LEUKOCYTESUR NEGATIVE 08/29/2014 0642       Imaging Studies:  Ct Abdomen Pelvis W Contrast  08/29/2014   CLINICAL DATA:  Lower abdominal pain for 10 hours. Emesis. History of colon cancer, currently treating with chemotherapy.  EXAM: CT ABDOMEN AND PELVIS WITH CONTRAST  TECHNIQUE: Multidetector CT imaging of the abdomen and pelvis was performed using the standard protocol following bolus administration of intravenous contrast.  CONTRAST:  48m OMNIPAQUE IOHEXOL 300 MG/ML SOLN, 1024mOMNIPAQUE IOHEXOL 300 MG/ML SOLN  COMPARISON:  CT 08/10/2014  FINDINGS: Decreased pleural effusions from prior, small right pleural effusion persists. Near complete resolution of previous parenchymal opacities. There is a subpleural nodule in the right middle lobe measuring 4 mm.  Multiple hypodense liver lesions in the right and left hepatic lobe, largest measuring 14 mm, unchanged. Unchanged linear hypodensity in the right hepatic lobe. Clips in the gallbladder fossa from cholecystectomy. Biliary stent remains in place.  The spleen, adrenal glands, and kidneys are normal.  The stomach is distended with fluid. Fluid-filled normal caliber small bowel loops throughout the abdomen. There is bowel thickening of small bowel loops in the right  lower abdomen. Ileal colonic anastomosis appears normal. There is fluid within the transverse and proximal descending colon. Patient is post right hemicolectomy. The previous postoperative drain is no longer seen.  Small amount of fluid adjacent to the liver. Decreased fluid in the pelvis with residual small volume of simple free fluid. No free intra-abdominal air.  The abdominal aorta is normal in caliber with dense atherosclerosis.  No intra-abdominal fluid collection.  Urinary bladder is physiologically distended. Prostate gland is normal in size.  Degenerative change in the lower lumbar spine. No focal osseous abnormality.  IMPRESSION: 1. Fluid-filled normal caliber small bowel loops, with wall thickening involving right lower quadrant small bowel. Fluid within the transverse and descending colon. Findings suggest enteritis/colitis, infectious or inflammatory in etiology. There is no obstruction. No free air or fluid collection. 2. Decreased free  fluid, small amount of free fluid persists in the pelvis. 3. Decreased pleural effusions, small right pleural effusion persists. Resolution of previous patchy opacities.   Electronically Signed   By: Jeb Levering M.D.   On: 08/29/2014 06:32    Assessment/Plan: 59 y.o.   Metastatic colon cancer- KRAS wild type Status post resection of primary tumor with biopsy of diaphragmatic met.  S/p cycle 1 chemotherapy on 3/23 with Camptosar/ Oxaliplatin/ Adrucil   Day 6 post chemo   Next chemo scheduled for April 13.   Nausea, Vomiting, Abdominal Cramping At this time of unknown etiology, which may include C diff, viral source versus recent chemo C diff being obtained Continue supportive care  Anemia Due to recent chemotherapy, malnutrition, dilution, possible infection No transfusion is indicated at this time Monitor counts closely Transfuse blood to maintain a Hb of 8 g or if the patient is acutely bleeding  Malnutrition Consider Nutrition  evaluation  DVT prophylaxis On Lovenox  Full Code   Other medical issues as per admitting team     **Disclaimer: This note was dictated with voice recognition software. Similar sounding words can inadvertently be transcribed and this note may contain transcription errors which may not have been corrected upon publication of note.** WERTMAN,SARA E, PA-C 08/30/2014  9:12 AM   ADDENDUM: i saw and examined the patient.  He actually looks better than I thought!!  I suspect that this might be from the Irinotecan. He did NOT receive any Avastin.  I will need to make sure that he receives an extra dose of Atropine with next chemo.  I did dose rediuce the chemo by 20% with his first cycle.  His hgb is down a little bit.  Will check it out tomorrow.  He may need a transfusion.  His exam is unremarkable.  We will follow along and help out in any way possible.  I appreciate al the help from the staff on 5W!!!  Pete E.  Romans 8:28

## 2014-08-30 NOTE — Progress Notes (Signed)
Patient ID: Donald Fry, male   DOB: Jan 06, 1956, 59 y.o.   MRN: [id number]  [facility] PROGRESS NOTE  FABRIZIO FILIP YSA:[id number] DOB: 12/02/1955 DOA: 08/29/2014 PCP: [name], MD   Brief narrative:    59 year old male with HLD, anemia, HTN, colon adenocarcinoma, recently diagnosed in 07/2014, s/p exploratory laparotomy, right colectomy, started chemotherapy one week prior to this admission, was just discharged on 3/14 and presented to [place] with main concern of several days duration of intermittent abd pain and cramping, 5/10 in severity, associated with multiple episodes of watery diarrhea and multiple episodes of non bloody vomiting.   ED workup notable for WBC 15.9 (patient is also on prednisone prior to admission), hypokalemia 3.3, CT abdomen and pelvis showed enteritis/colitis. [facility] asked to admit for further evaluation.   Assessment/Plan:    Principal Problem:   Abd pain with N/V/D - secondary to enterocolitis and possible some chemo side effects - C. Diff and stool panel still pending - keep on contact precautions for now - pt reports no diarrhea and vomiting in the past 24 hours - supplement electrolytes as needed and continue IVF for now - continue antiemetics as needed  Active Problems:   Essential hypertension - reasonable inpatient control    Hyperlipidemia - continue statin    Hypokalemia - secondary to diarrhea and vomiting - continue to supplement as indicated    Leukocytosis - secondary to colitis as noted above  - now WNL - repeat CBC In AM   Colon cancer metastasized to multiple sites - Status post resection of primary tumor with biopsy of diaphragmatic met. - S/p cycle 1 chemotherapy on 3/23 with Camptosar/ Oxaliplatin/ Adrucil - Day 6 post chemo, Next chemo scheduled for April 13 - appreciate oncology team following    Acute on chronic blood loss anemia - due to recent chemo and possible some dilutional effect from IVf pt has been  receiving  - no signs of active bleeding - repeat CBC in AM   Severe PCM - in the context of acute illness - nutritionist consulted   DVT prophylaxis - Lovenox SQ  Code Status: Full.  Family Communication:  plan of care discussed with the patient and wife at bedside  Disposition Plan: Home when stable.   IV access:  Peripheral IV  Procedures and diagnostic studies:     Ct Abdomen Pelvis W Contrast  08/29/2014   Fluid-filled normal caliber small bowel loops, with wall thickening involving right lower quadrant small bowel. Fluid within the transverse and descending colon. Findings suggest enteritis/colitis, infectious or inflammatory in etiology. There is no obstruction. No free air or fluid collection. 2. Decreased free fluid, small amount of free fluid persists in the pelvis. 3. Decreased pleural effusions, small right pleural effusion persists. Resolution of previous patchy opacities.   Dg Ercp  08/06/2014   Biliary stent placement.  These images were submitted for radiologic interpretation only.  Medical Consultants:  None   Other Consultants:  None   IAnti-Infectives:   Flagyl 3/27 -->  Faye Ramsay, MD  [facility] Pager [phone]  If 7PM-7AM, please contact night-coverage [email or web address] Password [email or web address] 08/30/2014, 1:06 PM   LOS: 1 day   HPI/Subjective: No events overnight.   Objective: Filed Vitals:   08/29/14 1440 08/29/14 1640 08/29/14 2100 08/30/14 0631  BP: 123/67  113/64 126/68  Pulse: 58  56 58  Temp: 98 F (36.7 C)  98.3 F (36.8 C) 97.8 F (36.6 C)  TempSrc: Oral  Oral Oral  Resp: 16  16 16   Height:  5' 9"  (1.753 m)    Weight:  62.234 kg (137 lb 3.2 oz)    SpO2: 98%  97% 99%    Intake/Output Summary (Last 24 hours) at 08/30/14 1306 Last data filed at 08/30/14 1150  Gross per 24 hour  Intake 3679.16 ml  Output   1500 ml  Net 2179.16 ml    Exam:   General:  Pt is alert, follows commands appropriately, not in acute distress  Cardiovascular:  Regular rate and rhythm, no rubs, no gallops  Respiratory: Clear to auscultation bilaterally, no wheezing, no crackles, no rhonchi  Abdomen: Soft, tender in epigastric area, non distended, bowel sounds present, no guarding  Extremities: No edema, pulses DP and PT palpable bilaterally  Neuro: Grossly nonfocal  Data Reviewed: Basic Metabolic Panel:  Recent Labs Lab 08/29/14 0400 08/29/14 1050 08/30/14 0450  NA 141  --  137  K 3.3*  --  3.8  CL 103  --  106  CO2 26  --  24  GLUCOSE 139*  --  115*  BUN 22  --  10  CREATININE 0.71 0.56 0.50  CALCIUM 9.4  --  8.5   Liver Function Tests:  Recent Labs Lab 08/29/14 0400  AST 17  ALT 14  ALKPHOS 84  BILITOT 0.4  PROT 7.4  ALBUMIN 3.4*    Recent Labs Lab 08/29/14 0400  LIPASE 27   CBC:  Recent Labs Lab 08/29/14 0400 08/29/14 1050 08/30/14 0450  WBC 15.9* 10.3 6.0  NEUTROABS 13.3*  --   --   HGB 11.7* 10.1* 8.8*  HCT 38.9* 34.3* 29.4*  MCV 75.7* 77.4* 76.6*  PLT 447* 400 302   Scheduled Meds: . aspirin EC  81 mg Oral Daily  . enoxaparin  injection  40 mg Subcutaneous Q24H  . megestrol  600 mg Oral Daily  . metronidazole  500 mg Intravenous 3 times per day  . mirtazapine  7.5-15 mg Oral QHS  . pravastatin  40 mg Oral q1800  . predniSONE  20 mg Oral Q breakfast   Continuous Infusions: . sodium chloride 75 mL/hr at 08/30/14 0850

## 2014-08-31 LAB — CBC
HEMATOCRIT: 28.9 % — AB (ref 39.0–52.0)
Hemoglobin: 8.6 g/dL — ABNORMAL LOW (ref 13.0–17.0)
MCH: 22.8 pg — AB (ref 26.0–34.0)
MCHC: 29.8 g/dL — ABNORMAL LOW (ref 30.0–36.0)
MCV: 76.7 fL — AB (ref 78.0–100.0)
PLATELETS: 283 10*3/uL (ref 150–400)
RBC: 3.77 MIL/uL — AB (ref 4.22–5.81)
RDW: 20.8 % — ABNORMAL HIGH (ref 11.5–15.5)
WBC: 9.6 10*3/uL (ref 4.0–10.5)

## 2014-08-31 LAB — BASIC METABOLIC PANEL
Anion gap: 7 (ref 5–15)
BUN: 11 mg/dL (ref 6–23)
CALCIUM: 8.3 mg/dL — AB (ref 8.4–10.5)
CO2: 24 mmol/L (ref 19–32)
Chloride: 106 mmol/L (ref 96–112)
Creatinine, Ser: 0.54 mg/dL (ref 0.50–1.35)
GFR calc non Af Amer: >90 mL/min (ref >90)
GLUCOSE: 93 mg/dL (ref 70–99)
POTASSIUM: 3.1 mmol/L — AB (ref 3.5–5.1)
Sodium: 137 mmol/L (ref 135–145)

## 2014-08-31 LAB — PREPARE RBC (CROSSMATCH)

## 2014-08-31 MED ORDER — SENNOSIDES 8.8 MG/5ML PO SYRP
5.0000 mL | ORAL_SOLUTION | Freq: Every day | ORAL | Status: DC
  Administered 2014-09-01: 5 mL via ORAL
  Filled 2014-08-31 (×3): qty 5

## 2014-08-31 MED ORDER — SENNOSIDES 8.8 MG/5ML PO SYRP: 5.0000 mL | ORAL_SOLUTION | Freq: Two times a day (BID) | ORAL | Status: DC

## 2014-08-31 MED ORDER — POTASSIUM CHLORIDE 10 MEQ/50ML IV SOLN
10.0000 meq | INTRAVENOUS | Status: AC
  Administered 2014-08-31 (×4): 10 meq via INTRAVENOUS
  Filled 2014-08-31 (×4): qty 50

## 2014-08-31 MED ORDER — SODIUM CHLORIDE 0.9 % IV SOLN: Freq: Once | INTRAVENOUS | Status: DC

## 2014-08-31 NOTE — Progress Notes (Signed)
Mr. Donald Fry feels pretty good this morning. He still has had no episodes of diarrhea. There's been no abdominal pain. He's eating. He's had no nausea or vomiting.  His hemoglobin is 8.6. I really believe that he would benefit from 2 units of blood. With his chemotherapy regimen, he will become more anemic. I think if he becomes more anemic, this will put more stress on his intestinal system.  He's had no obvious bleeding.  His potassium is 3.1. We'll give him some runs of potassium.  His Port-A-Cath is now accessed. He is very happy about this.  On his physical exam, all his vital signs are stable. Temperature is 98.1. Pulse 58. Blood pressure 136/75. His abdomen is soft. Bowel sounds are present. There is no guarding or rebound tenderness. He has no palpable abdominal mass. There is no palpable hepatosplenomegaly. Lungs are clear. Cardiac exam regular rate and rhythm. Oral exam shows no mucositis.  Again, I think that the abdominal pain that he had an diarrhea that he had was probably from the irinotecan that he got as part of his chemotherapy regimen. Moment he gets his next cycle, I will have to make sure that he gets atropine. If he still has these symptoms, and I think that we may had a drop the irinotecan.  I would not give him Avastin at this point. I do want to "throwi in" any additional factors that may cause abdominal discomfort.  Hopefully, if all looks good today, he will be able to go home soon.  Pete E.  1 Timothy 4:10

## 2014-08-31 NOTE — Progress Notes (Addendum)
Patient ID: Donald Fry, male   DOB: 1956/02/19, 59 y.o.   MRN: [id number]  [facility] PROGRESS NOTE  TILDEN BROZ VFI:[id number] DOB: [birth year] DOA: 08/29/2014 PCP: [name], MD   Brief narrative:    59 year old male with HLD, anemia, HTN, colon adenocarcinoma, recently diagnosed in 07/2014, s/p exploratory laparotomy, right colectomy, started chemotherapy one week prior to this admission, was just discharged on 3/14 and presented to [place] with main concern of several days duration of intermittent abd pain and cramping, 5/10 in severity, associated with multiple episodes of watery diarrhea and multiple episodes of non bloody vomiting.   ED workup notable for WBC 15.9 (patient is also on prednisone prior to admission), hypokalemia 3.3, CT abdomen and pelvis showed enteritis/colitis. TR[facility] asked to admit for further evaluation.   Assessment/Plan:    Principal Problem:   Abd pain with N/V/D - secondary to enterocolitis and possible some chemo side effects - C. Diff and stool panel not collected as pt has not had more BM's - pt reports feeling better this AM, tolerating diet well, no N/V/D - supplement electrolytes as needed and continue IVF for now - continue antiemetics as needed  Active Problems:   Essential hypertension - reasonable inpatient control    Hyperlipidemia - continue statin    Hypokalemia - secondary to diarrhea and vomiting - continue to supplement and repeat BMP in AM   Leukocytosis - secondary to colitis as noted above  - now WNL   Colon cancer metastasized to multiple sites - Status post resection of primary tumor with biopsy of diaphragmatic met. - S/p cycle 1 chemotherapy on 3/23 with Camptosar/ Oxaliplatin/ Adrucil - Day 6 post chemo, Next chemo scheduled for April 13 - appreciate oncology team following    Acute on chronic blood loss anemia - due to recent chemo and possible some dilutional effect from IVF pt has been receiving  - plan  to transfuse two U PRBC today - repeat CBC in AM   Severe PCM - in the context of acute illness - nutritionist consulted   DVT prophylaxis - Lovenox SQ  Code Status: Full.  Family Communication:  plan of care discussed with the patient and wife at bedside  Disposition Plan: Home  IV access:  Peripheral IV  Procedures and diagnostic studies:     Ct Abdomen Pelvis W Contrast  08/29/2014   Fluid-filled normal caliber small bowel loops, with wall thickening involving right lower quadrant small bowel. Fluid within the transverse and descending colon. Findings suggest enteritis/colitis, infectious or inflammatory in etiology. There is no obstruction. No free air or fluid collection. 2. Decreased free fluid, small amount of free fluid persists in the pelvis. 3. Decreased pleural effusions, small right pleural effusion persists. Resolution of previous patchy opacities.   Dg Ercp  08/06/2014   Biliary stent placement.  These images were submitted for radiologic interpretation only.  Medical Consultants:  None   Other Consultants:  None   IAnti-Infectives:   Flagyl 3/27 -->  Faye Ramsay, MD  [facility] Pager [phone]  If 7PM-7AM, please contact night-coverage [email or web address] Password [email or web address] 08/31/2014, 9:04 AM   LOS: 2 days   HPI/Subjective: No events overnight.   Objective: Filed Vitals:   08/30/14 0631 08/30/14 1444 08/30/14 2249 08/31/14 0613  BP: 126/68 139/65 123/67 136/75  Pulse: 58 70 56 58  Temp: 97.8 F (36.6 C) 98.1 F (36.7 C) 98 F (36.7 C) 98.1 F (36.7 C)  TempSrc: Oral Oral Oral Oral  Resp:  16 16 16 16   Height:      Weight:      SpO2: 99% 99% 99% 99%    Intake/Output Summary (Last 24 hours) at 08/31/14 0904 Last data filed at 08/31/14 4098  Gross per 24 hour  Intake 2360.83 ml  Output   2075 ml  Net 285.83 ml    Exam:   General:  Pt is alert, follows commands appropriately, not in acute distress  Cardiovascular: Regular rhythm, mild bradycardia,   no rubs, no gallops  Respiratory: Clear to auscultation bilaterally, no wheezing, no crackles, no rhonchi  Abdomen: Soft, non tender, non distended, bowel sounds present, no guarding  Extremities: No edema, pulses DP and PT palpable bilaterally  Data Reviewed: Basic Metabolic Panel:  Recent Labs Lab 08/29/14 0400 08/29/14 1050 08/30/14 0450 08/31/14 0505  NA 141  --  137 137  K 3.3*  --  3.8 3.1*  CL 103  --  106 106  CO2 26  --  24 24  GLUCOSE 139*  --  115* 93  BUN 22  --  10 11  CREATININE 0.71 0.56 0.50 0.54  CALCIUM 9.4  --  8.5 8.3*   Liver Function Tests:  Recent Labs Lab 08/29/14 0400  AST 17  ALT 14  ALKPHOS 84  BILITOT 0.4  PROT 7.4  ALBUMIN 3.4*    Recent Labs Lab 08/29/14 0400  LIPASE 27   CBC:  Recent Labs Lab 08/29/14 0400 08/29/14 1050 08/30/14 0450 08/31/14 0505  WBC 15.9* 10.3 6.0 9.6  NEUTROABS 13.3*  --   --   --   HGB 11.7* 10.1* 8.8* 8.6*  HCT 38.9* 34.3* 29.4* 28.9*  MCV 75.7* 77.4* 76.6* 76.7*  PLT 447* 400 302 283   Scheduled Meds: . aspirin EC  81 mg Oral Daily  . enoxaparin  injection  40 mg Subcutaneous Q24H  . megestrol  600 mg Oral Daily  . metronidazole  500 mg Intravenous 3 times per day  . mirtazapine  7.5-15 mg Oral QHS  . pravastatin  40 mg Oral q1800  . predniSONE  20 mg Oral Q breakfast   Continuous Infusions: . sodium chloride 75 mL/hr at 08/31/14 [phone]

## 2014-08-31 NOTE — Progress Notes (Signed)
PT Cancellation Note  Patient Details Name: Donald Fry MRN: [id number] DOB: 11/11/1955   Cancelled Treatment:    Reason Eval/Treat Not Completed: PT screened, no needs identified, will sign off. Spoke with pt and RN-pt walking hallways unassisted with wife on today. Pt denied need for PT services at this time. Will sign off.    Weston Anna, MPT Pager: [phone]

## 2014-08-31 NOTE — Progress Notes (Signed)
NUTRITION FOLLOW UP  Intervention:   [employer] Instant Breakfast TID, each supplement provides 130 kcal and 5 g protein. Mix with 8 oz whole milk to provide additional 150 kcal and 8 g protein (280 kcal, 13 g protein total) - RD will continue to monitor  Nutrition Dx:   Inadequate oral intake related to nausea/vomiting/diarrhea as evidenced by poor po and wt loss. ongoing  Goal:   Pt to meet >/= 90% of their estimated nutrition needs   Monitor:   Weight trend, po intake, acceptance of supplements, labs  Assessment:   59 year old male with hyperlipidemia, anemia, hypertension, adenocarcinoma colon, recently diagnosed in 07/2014, status post exploratory laparotomy, right colectomy, started chemotherapy last week, was just discharged on 3/14 presented to [facility] ED with nausea, vomiting, diarrhea for last 2 days.    08/30/2014 - Pt reports a 28 lb wt loss in ~5 weeks (17%- significant for time frame.) Wt loss began during last hospitalization.  - Pt reports that his appetite has been improving due to addition of medication (possibly Megace?) until he began having current symptoms. He ate a cinnamon roll this morning from Mrs. Winners and tolerated it.  - Pt had just had a suppository prior to RD visit and did not feel like he needed to have a bowel movement yet.  - Pt does not like Ensure, [employer], or Boost supplements. Will order [employer] Essentials for pt to try.  - Labs reviewed K low  08/31/2014 Consult received for Nutritional needs assessment. Assessment done on 03/28.  Pt reports having better appetite today. Per wife he ate most of his [employer] sausage biscuit and some biscuit with butter and jelly, tolerated it well. Has not received his [employer] yet, but is open to trying [employer] with the next meal. Was not feeling hungry to order lunch yet due to "big breakfast". Order for supplements has been placed.   Height: Ht  Readings from Last 1 Encounters:  08/29/14 5\' 9"  (1.753 m)    Weight Status:   Wt Readings from Last 1 Encounters:  08/29/14 137 lb 3.2 oz (62.234 kg)    Re-estimated needs:  Kcal: 1800-2000  Protein: 90-100 g  Fluid: 2.0 L/day   Skin: WDL  Diet Order: Diet regular Room service appropriate?: Yes; Fluid consistency:: Thin   Intake/Output Summary (Last 24 hours) at 08/31/14 1511 Last data filed at 08/31/14 1415  Gross per 24 hour  Intake   1750 ml  Output   2400 ml  Net   -650 ml    Last BM: PTA   Labs:   Recent Labs Lab 08/29/14 0400 08/29/14 1050 08/30/14 0450 08/31/14 0505  NA 141  --  137 137  K 3.3*  --  3.8 3.1*  CL 103  --  106 106  CO2 26  --  24 24  BUN 22  --  10 11  CREATININE 0.71 0.56 0.50 0.54  CALCIUM 9.4  --  8.5 8.3*  GLUCOSE 139*  --  115* 93    CBG (last 3)  No results for input(s): GLUCAP in the last 72 hours.  Scheduled Meds: . sodium chloride   Intravenous Once  . antiseptic oral rinse  7 mL Mouth Rinse BID  . aspirin EC  81 mg Oral Daily  . enoxaparin (LOVENOX) injection  40 mg Subcutaneous Q24H  . famotidine  40 mg Oral BID  . feeding supplement (ENSURE ENLIVE)  237 mL Oral BID BM  .  feeding supplement (RESOURCE BREEZE)  1 Container Oral TID BM  . megestrol  600 mg Oral Daily  . metronidazole  500 mg Intravenous 3 times per day  . mirtazapine  7.5-15 mg Oral QHS  . pravastatin  40 mg Oral q1800  . predniSONE  10 mg Oral Q breakfast  . sennosides  5 mL Oral Daily    Continuous Infusions: . sodium chloride 75 mL/hr at 08/31/14 0631    Nakyia Dau [facility] Dietetic Intern Pager: [phone] 08/31/2014 3:17 PM

## 2014-09-01 LAB — COMPREHENSIVE METABOLIC PANEL
ALT: 13 U/L (ref 0–53)
AST: 23 U/L (ref 0–37)
Albumin: 2.8 g/dL — ABNORMAL LOW (ref 3.5–5.2)
Alkaline Phosphatase: 60 U/L (ref 39–117)
Anion gap: 7 (ref 5–15)
BILIRUBIN TOTAL: 1.1 mg/dL (ref 0.3–1.2)
BUN: 9 mg/dL (ref 6–23)
CO2: 24 mmol/L (ref 19–32)
Calcium: 8.4 mg/dL (ref 8.4–10.5)
Chloride: 105 mmol/L (ref 96–112)
Creatinine, Ser: 0.54 mg/dL (ref 0.50–1.35)
GFR calc non Af Amer: >90 mL/min (ref >90)
Glucose, Bld: 91 mg/dL (ref 70–99)
Potassium: 3.6 mmol/L (ref 3.5–5.1)
SODIUM: 136 mmol/L (ref 135–145)
Total Protein: 6.2 g/dL (ref 6.0–8.3)

## 2014-09-01 LAB — TYPE AND SCREEN
ABO/RH(D): A POS
Antibody Screen: NEGATIVE
UNIT DIVISION: 0
Unit division: 0

## 2014-09-01 LAB — CBC
HCT: 36.7 % — ABNORMAL LOW (ref 39.0–52.0)
HEMOGLOBIN: 11.3 g/dL — AB (ref 13.0–17.0)
MCH: 23.8 pg — ABNORMAL LOW (ref 26.0–34.0)
MCHC: 30.8 g/dL (ref 30.0–36.0)
MCV: 77.3 fL — ABNORMAL LOW (ref 78.0–100.0)
Platelets: 252 10*3/uL (ref 150–400)
RBC: 4.75 MIL/uL (ref 4.22–5.81)
RDW: 19.3 % — AB (ref 11.5–15.5)
WBC: 6.8 10*3/uL (ref 4.0–10.5)

## 2014-09-01 LAB — CLOSTRIDIUM DIFFICILE BY PCR: CDIFFPCR: POSITIVE — AB

## 2014-09-01 MED ORDER — BOOST / RESOURCE BREEZE PO LIQD: 1.0000 | Freq: Three times a day (TID) | ORAL | Status: DC

## 2014-09-01 MED ORDER — ENSURE ENLIVE PO LIQD: 237.0000 mL | Freq: Two times a day (BID) | ORAL | Status: DC

## 2014-09-01 MED ORDER — ALPRAZOLAM 1 MG PO TABS: 1.0000 mg | ORAL_TABLET | Freq: Four times a day (QID) | ORAL | Status: DC | PRN

## 2014-09-01 MED ORDER — TRAMADOL HCL 50 MG PO TABS: 50.0000 mg | ORAL_TABLET | Freq: Four times a day (QID) | ORAL | Status: DC | PRN

## 2014-09-01 MED ORDER — METRONIDAZOLE 500 MG PO TABS: 500.0000 mg | ORAL_TABLET | Freq: Three times a day (TID) | ORAL | Status: DC

## 2014-09-01 MED ORDER — PREDNISONE 10 MG PO TABS: 10.0000 mg | ORAL_TABLET | Freq: Every day | ORAL | Status: DC

## 2014-09-01 MED ORDER — POTASSIUM CHLORIDE ER 10 MEQ PO TBCR: 10.0000 meq | EXTENDED_RELEASE_TABLET | Freq: Every day | ORAL | Status: DC

## 2014-09-01 MED ORDER — HEPARIN SOD (PORK) LOCK FLUSH 100 UNIT/ML IV SOLN
500.0000 [IU] | INTRAVENOUS | Status: AC | PRN
  Administered 2014-09-01: 500 [IU]

## 2014-09-01 NOTE — Discharge Summary (Signed)
Physician Discharge Summary  Donald Fry TDD:[id number] DOB: Jul 13, 1955 DOA: 08/29/2014  PCP: [name], MD  Admit date: 08/29/2014 Discharge date: 09/01/2014  Recommendations for Outpatient Follow-up:  1. Pt will need to follow up with PCP in 2-3 weeks post discharge 2. Please obtain BMP to evaluate electrolytes and kidney function 3. Please also check CBC to evaluate Hg and Hct levels 4. Pt advised to stop taking atenolol due to bradycardia with HR in 40's, BP has remained stable off antihypertensive regimen  5. Pt also advised to continue taking Flagyl upon discharge to complete therapy for C. Diff   Discharge Diagnoses:  Principal Problem:   Colitis Active Problems:   Essential hypertension   Hyperlipidemia   Hypokalemia   Colon cancer metastasized to multiple sites   Dehydration   Discharge Condition: Stable  Diet recommendation: Heart healthy diet discussed in details   Brief narrative:    59 year old male with HLD, anemia, HTN, colon adenocarcinoma, recently diagnosed in 07/2014, s/p exploratory laparotomy, right colectomy, started chemotherapy one week prior to this admission, was just discharged on 3/14 and presented to [place] with main concern of several days duration of intermittent abd pain and cramping, 5/10 in severity, associated with multiple episodes of watery diarrhea and multiple episodes of non bloody vomiting.   ED workup notable for WBC 15.9 (patient is also on prednisone prior to admission), hypokalemia 3.3, CT abdomen and pelvis showed enteritis/colitis. TRH asked to admit for further evaluation.   Assessment/Plan:    Principal Problem:  Abd pain with N/V/D - secondary to C. Diff colitis and possible some chemo side effects - pt reports feeling better this AM, tolerating diet well, no N/V/D - supplemented electrolytes, pt wants to go home today  - continue antiemetics as needed upon discharge  - pt also to continue flagyl upon  discharge to complete therapy (total 14 days) Active Problems:  Essential hypertension - reasonable inpatient control off antihypertensive regimen   Hyperlipidemia - continue statin   Hypokalemia - secondary to diarrhea and vomiting - supplemented   Leukocytosis - secondary to colitis as noted above  - now WNL  Colon cancer metastasized to multiple sites - Status post resection of primary tumor with biopsy of diaphragmatic met. - S/p cycle 1 chemotherapy on 3/23 with Camptosar/ Oxaliplatin/ Adrucil - Day 6 post chemo, Next chemo scheduled for April 13 - appreciate oncology team following   Acute on chronic blood loss anemia - due to recent chemo and possible some dilutional effect from IVF pt has been receiving  - status post transfusion of 2 U PRBC with appropriate increase in post transfusion Hg/Hct   Severe PCM - in the context of acute illness - nutritionist consulted   Code Status: Full.  Family Communication: plan of care discussed with the patient and wife at bedside  Disposition Plan: Home  IV access:  Peripheral IV  Procedures and diagnostic studies:    Ct Abdomen Pelvis W Contrast 08/29/2014 Fluid-filled normal caliber small bowel loops, with wall thickening involving right lower quadrant small bowel. Fluid within the transverse and descending colon. Findings suggest enteritis/colitis, infectious or inflammatory in etiology. There is no obstruction. No free air or fluid collection. 2. Decreased free fluid, small amount of free fluid persists in the pelvis. 3. Decreased pleural effusions, small right pleural effusion persists. Resolution of previous patchy opacities.  Dg Ercp 08/06/2014 Biliary stent placement. These images were submitted for radiologic interpretation only.  Medical Consultants:  None   Other  Consultants:  None   IAnti-Infectives:   Flagyl 3/27 -->     Discharge Exam: Filed Vitals:   09/01/14 0530  BP:  152/73  Pulse: 49  Temp: 98 F (36.7 C)  Resp: 16   Filed Vitals:   09/01/14 0050 09/01/14 0145 09/01/14 0230 09/01/14 0530  BP: 150/68 148/80 152/74 152/73  Pulse: 49 50 50 49  Temp: 98.7 F (37.1 C) 97.8 F (36.6 C) 98 F (36.7 C) 98 F (36.7 C)  TempSrc: Oral Oral Oral Oral  Resp: 16 18 16 16   Height:      Weight:      SpO2: 99% 99% 99% 99%    General: Pt is alert, follows commands appropriately, not in acute distress Cardiovascular: Regular rate and rhythm, S1/S2 +, no murmurs, no rubs, no gallops Respiratory: Clear to auscultation bilaterally, no wheezing, no crackles, no rhonchi Abdominal: Soft, non tender, non distended, bowel sounds +, no guarding Extremities: no edema, no cyanosis, pulses palpable bilaterally DP and PT Neuro: Grossly nonfocal  Discharge Instructions  Discharge Instructions    Diet - low sodium heart healthy    Complete by:  As directed      Increase activity slowly    Complete by:  As directed             Medication List    STOP taking these medications        atenolol 100 MG tablet  Commonly known as:  TENORMIN     dexamethasone 4 MG tablet  Commonly known as:  DECADRON     loperamide 2 MG tablet  Commonly known as:  IMODIUM A-D     LORazepam 0.5 MG tablet  Commonly known as:  ATIVAN     ondansetron 8 MG tablet  Commonly known as:  ZOFRAN      TAKE these medications        ALPRAZolam 1 MG tablet  Commonly known as:  XANAX  Take 1 tablet (1 mg total) by mouth 4 (four) times daily as needed for anxiety.     aspirin 81 MG tablet  Take 81 mg by mouth daily.     feeding supplement (ENSURE) Pudg  Take 1 Container by mouth 3 (three) times daily between meals.     feeding supplement (ENSURE ENLIVE) Liqd  Take 237 mLs by mouth 2 (two) times daily between meals.     feeding supplement (RESOURCE BREEZE) Liqd  Take 1 Container by mouth 3 (three) times daily between meals.     lidocaine-prilocaine cream  Commonly known as:   EMLA  Apply to affected area once     lovastatin 40 MG tablet  Commonly known as:  MEVACOR  Take 40 mg by mouth at bedtime.     megestrol 400 MG/10ML suspension  Commonly known as:  MEGACE  Take 15 mLs (600 mg total) by mouth daily.     metroNIDAZOLE 500 MG tablet  Commonly known as:  FLAGYL  Take 1 tablet (500 mg total) by mouth 3 (three) times daily.     mirtazapine 15 MG tablet  Commonly known as:  REMERON  Take 0.5-1 tablets (7.5-15 mg total) by mouth at bedtime.     multivitamin-iron-minerals-folic acid chewable tablet  Chew 1 tablet by mouth daily.     predniSONE 10 MG tablet  Commonly known as:  DELTASONE  Take 1 tablet (10 mg total) by mouth daily with breakfast.     PRESCRIPTION MEDICATION  Apply 1 application topically daily. Applies  to hands and feet.     prochlorperazine 10 MG tablet  Commonly known as:  COMPAZINE  Take 1 tablet (10 mg total) by mouth every 6 (six) hours as needed (Nausea or vomiting).     senna 8.6 MG Tabs tablet  Commonly known as:  SENOKOT  Take 1 tablet (8.6 mg total) by mouth daily as needed for mild constipation.     temazepam 15 MG capsule  Commonly known as:  RESTORIL  Take 1 capsule (15 mg total) by mouth at bedtime as needed for sleep.     traMADol 50 MG tablet  Commonly known as:  ULTRAM  Take 1 tablet (50 mg total) by mouth every 6 (six) hours as needed for severe pain.           Follow-up Information    Follow up with Faye Ramsay, MD.   Specialty:  Internal Medicine   Why:  call my cell phone [phone]   Contact information:   [address] [place] [address] [phone]        The results of significant diagnostics from this hospitalization (including imaging, microbiology, ancillary and laboratory) are listed below for reference.     Microbiology: Recent Results (from the past 240 hour(s))  Clostridium Difficile by PCR     Status: Abnormal   Collection Time: 09/01/14  5:45  AM  Result Value Ref Range Status   C difficile by pcr POSITIVE (A) NEGATIVE Final    Comment: CRITICAL RESULT CALLED TO, READ BACK BY AND VERIFIED WITH: WILLIAMS JAMES,D @ 0926 ON [id number] BY POTEAT,S      Labs: Basic Metabolic Panel:  Recent Labs Lab 08/29/14 0400 08/29/14 1050 08/30/14 0450 08/31/14 0505 09/01/14 0400  NA 141  --  137 137 136  K 3.3*  --  3.8 3.1* 3.6  CL 103  --  106 106 105  CO2 26  --  24 24 24   GLUCOSE 139*  --  115* 93 91  BUN 22  --  10 11 9   CREATININE 0.71 0.56 0.50 0.54 0.54  CALCIUM 9.4  --  8.5 8.3* 8.4   Liver Function Tests:  Recent Labs Lab 08/29/14 0400 09/01/14 0400  AST 17 23  ALT 14 13  ALKPHOS 84 60  BILITOT 0.4 1.1  PROT 7.4 6.2  ALBUMIN 3.4* 2.8*    Recent Labs Lab 08/29/14 0400  LIPASE 27   CBC:  Recent Labs Lab 08/29/14 0400 08/29/14 1050 08/30/14 0450 08/31/14 0505 09/01/14 0400  WBC 15.9* 10.3 6.0 9.6 6.8  NEUTROABS 13.3*  --   --   --   --   HGB 11.7* 10.1* 8.8* 8.6* 11.3*  HCT 38.9* 34.3* 29.4* 28.9* 36.7*  MCV 75.7* 77.4* 76.6* 76.7* 77.3*  PLT 447* 400 302 283 252    BNP (last 3 results)  Recent Labs  07/25/14 0850 08/09/14 0440  BNP 93.0 180.7*   SIGNED: Time coordinating discharge: Over 30 minutes  Faye Ramsay, MD  [facility] 09/01/2014, 10:45 AM Pager [phone]  If 7PM-7AM, please contact night-coverage [email or web address] Password [email or web address]

## 2014-09-01 NOTE — Discharge Instructions (Signed)
Clostridium Difficile Infection °Clostridium difficile (C. difficile) is a bacteria found in the intestinal tract or colon. Under certain conditions, it causes diarrhea and sometimes severe disease. The severe form of the disease is known as pseudomembranous colitis (often called C. difficile colitis). This disease can damage the lining of the colon or cause the colon to become enlarged (toxic megacolon). °CAUSES °Your colon normally contains many different bacteria, including C. difficile. The balance of bacteria in your colon can change during illness. This is especially true when you take antibiotic medicine. Taking antibiotics may allow the C. difficile to grow, multiply excessively, and make a toxin that then causes illness. The elderly and people with certain medical conditions have a greater risk of getting C. difficile infections. °SYMPTOMS °· Watery diarrhea. °· Fever. °· Fatigue. °· Loss of appetite. °· Nausea. °· Abdominal swelling, pain, or tenderness. °· Dehydration. °DIAGNOSIS °Your symptoms may make your caregiver suspect a C. difficile infection, especially if you have used antibiotics in the preceding weeks. However, there are only 2 ways to know for certain whether you have a C. difficile infection: °· A lab test that finds the toxin in your stool. °· The specific appearance of an abnormality (pseudomembrane) in your colon. This can only be seen by doing a sigmoidoscopy or colonoscopy. These procedures involve passing an instrument through your rectum to look at the inside of your colon. °Your caregiver will help determine if these tests are necessary. °TREATMENT °· Most people are successfully treated with one of two specific antibiotics, usually given by mouth. Other antibiotics you are receiving are stopped if possible. °· Intravenous (IV) fluids and correction of electrolyte imbalance may be necessary. °· Rarely, surgery may be needed to remove the infected part of the intestines. °· Careful  hand washing by you and your caregivers is important to prevent the spread of infection. In the hospital, your caregivers may also put on gowns and gloves to prevent the spread of the C. difficile bacteria. Your room is also cleaned regularly with a solution containing bleach or a product that is known to kill C. difficile. °HOME CARE INSTRUCTIONS °· Drink enough fluids to keep your urine clear or pale yellow. Avoid milk, caffeine, and alcohol. °· Ask your caregiver for specific rehydration instructions. °· Try eating small, frequent meals rather than large meals. °· Take your antibiotics as directed. Finish them even if you start to feel better. °· Do not use medicines to slow diarrhea. This could delay healing or cause complications. °· Wash your hands thoroughly after using the bathroom and before preparing food. °· Make sure people who live with you wash their hands often, too. °· Carefully disinfect all surfaces with a product that contains chlorine bleach. °SEEK MEDICAL CARE IF: °· Diarrhea persists longer than expected or recurs after completing your course of antibiotic treatment for the C. difficile infection. °· You have trouble staying hydrated. °SEEK IMMEDIATE MEDICAL CARE IF: °· You develop a new fever. °· You have increasing abdominal pain or tenderness. °· There is blood in your stools, or your stools are dark black and tarry. °· You cannot hold down food or liquids. °MAKE SURE YOU: °· Understand these instructions. °· Will watch your condition. °· Will get help right away if you are not doing well or get worse. °Document Released: 02/28/2005 Document Revised: 10/05/2013 Document Reviewed: 10/27/2010 °ExitCare® Patient Information ©2015 [employer]. This information is not intended to replace advice given to you by your health care provider. Make sure you   discuss any questions you have with your health care provider. ° °

## 2014-09-01 NOTE — Progress Notes (Signed)
Discharge instructions and prescriptions  given to patient and wife.  Questions answered.  IV team into dc port

## 2014-09-01 NOTE — Progress Notes (Signed)
Donald Fry really looks good this morning. He is eating regular food. He is ambulating. He's not having diarrhea. There is no abdominal pain area  He's had no bleeding. He tolerated his 2 units of blood well. I think this helped her mouth quite a bit.  His potassium is 3.6. I will give him some extra potassium this morning.  On his physical exam, his vital signs are all stable. Blood pressure is 152/73. Pulse is 49. He is afebrile. His lungs are clear. Cardiac exam regular rate and rhythm with no murmurs, rubs or bruits. Oral exam shows no mucositis. Abdomen is soft. There is no guarding or rebound tenderness. He has no fluid wave. There is no palpable abdominal mass. He has no palpable hepatosplenomegaly. Extremities shows no clubbing, cyanosis or edema. Skin exam shows no rashes.  He has metastatic colon cancer. He had abdominal pain and some diarrhea when he came in to the hospital. This all has resolved. Again, I really think that this is all from his chemotherapy. He still is on Flagyl for the possibility of C. difficile. Results are pending.  He really wants to go home. I would think that it should be okay for him to go home from a oncologic point of view.  I appreciate all the great care that he is received. He is very thankful for the compassion of the staff upon [address].  Bunnie Philips 16:7

## 2014-09-01 NOTE — Progress Notes (Signed)
Dr Doyle Askew text paged positive cdiff results

## 2014-09-03 LAB — GI PATHOGEN PANEL BY PCR, STOOL
C DIFFICILE TOXIN A/B: NOT DETECTED
CAMPYLOBACTER BY PCR: NOT DETECTED
Cryptosporidium by PCR: NOT DETECTED
E COLI (ETEC) LT/ST: NOT DETECTED
E COLI (STEC): NOT DETECTED
E COLI 0157 BY PCR: NOT DETECTED
G LAMBLIA BY PCR: NOT DETECTED
Norovirus GI/GII: NOT DETECTED
Rotavirus A by PCR: NOT DETECTED
SALMONELLA BY PCR: NOT DETECTED
Shigella by PCR: NOT DETECTED

## 2014-09-08 ENCOUNTER — Encounter (HOSPITAL_COMMUNITY): Payer: Self-pay | Admitting: *Deleted

## 2014-09-08 ENCOUNTER — Ambulatory Visit (INDEPENDENT_AMBULATORY_CARE_PROVIDER_SITE_OTHER): Payer: Medicaid Other | Admitting: Gastroenterology

## 2014-09-08 ENCOUNTER — Encounter: Payer: Self-pay | Admitting: Gastroenterology

## 2014-09-08 ENCOUNTER — Telehealth: Payer: Self-pay | Admitting: Hematology & Oncology

## 2014-09-08 ENCOUNTER — Encounter: Payer: Self-pay | Admitting: General Surgery

## 2014-09-08 VITALS — BP 124/70 | HR 68 | Ht 69.0 in | Wt 146.0 lb

## 2014-09-08 DIAGNOSIS — A047 Enterocolitis due to Clostridium difficile: Secondary | ICD-10-CM | POA: Diagnosis not present

## 2014-09-08 DIAGNOSIS — K9189 Other postprocedural complications and disorders of digestive system: Principal | ICD-10-CM

## 2014-09-08 DIAGNOSIS — C787 Secondary malignant neoplasm of liver and intrahepatic bile duct: Secondary | ICD-10-CM

## 2014-09-08 DIAGNOSIS — K929 Disease of digestive system, unspecified: Secondary | ICD-10-CM

## 2014-09-08 DIAGNOSIS — D509 Iron deficiency anemia, unspecified: Secondary | ICD-10-CM

## 2014-09-08 DIAGNOSIS — A0472 Enterocolitis due to Clostridium difficile, not specified as recurrent: Secondary | ICD-10-CM

## 2014-09-08 DIAGNOSIS — C189 Malignant neoplasm of colon, unspecified: Secondary | ICD-10-CM | POA: Diagnosis not present

## 2014-09-08 DIAGNOSIS — K838 Other specified diseases of biliary tract: Secondary | ICD-10-CM

## 2014-09-08 NOTE — Patient Instructions (Addendum)
You have been scheduled for a ERCP at [facility] have been given separate instructions.   Thank you for choosing me and [facility].  Pricilla Riffle. Dagoberto Ligas., MD., Marval Regal

## 2014-09-08 NOTE — Progress Notes (Signed)
    History of Present Illness: This is a 59 year old male accompanied by his wife. He was diagnosed with metastatic colon cancer about 4 weeks ago. He underwent resection that included a cholecystectomy. He had a subsequent bile leak and ERCP was performed with biliary stent placement. He was subsequently hospitalized for C. difficile and he has responded well to metronidazole. He started chemotherapy for metastatic colon cancer and is followed by Dr. Marin Olp. He notes his appetite is improved over the past 2 weeks and he has gained approximately 11 pounds back. He is no active GI complaints today.  Current Medications, Allergies, Past Medical History, Past Surgical History, Family History and Social History were reviewed in [employer] record.  Physical Exam: General: Well developed , well nourished, thin, chronically ill appearing no acute distress Head: Normocephalic and atraumatic Eyes:  sclerae anicteric, EOMI Ears: Normal auditory acuity Mouth: No deformity or lesions Lungs: Clear throughout to auscultation Heart: Regular rate and rhythm; no murmurs, rubs or bruits Abdomen: Soft, non tender and non distended. Well-healed midline incision. No masses, hepatosplenomegaly or hernias noted. Normal Bowel sounds Musculoskeletal: Symmetrical with no gross deformities  Pulses:  Normal pulses noted Extremities: No clubbing, cyanosis, edema or deformities noted Neurological: Alert oriented x 4, grossly nonfocal Psychological:  Alert and cooperative. Normal mood and affect  Assessment and Recommendations:  1. Bile leak post cholecystectomy. ERCP with biliary stent placed on 08/06/14. Schedule ERCP with stent removal. The risks (including bleeding, perforation, infection, missed lesions, medication reactions and possible hospitalization or surgery if complications occur), benefits, and alternatives to ERCP with possible sphincterotomy, stent removal, stent replacement were  discussed with the patient and they consent to proceed.   2. C diff. Complete entire 14 day course of metronidazole as prescribed. Begin Florastor bid for 4 weeks.   3. Colon cancer of hepatic flexure, metastatic. Post resection. Continue follow-up with Dr. Marin Olp.  4. Microcytic anemia. Likely secondary to chronic blood loss from colon cancer. Iron studies have been ordered. Ongoing follow-up with Dr. Marin Olp.

## 2014-09-08 NOTE — Progress Notes (Signed)
Patient ID: Donald Fry, male   DOB: 1956/02/08, 59 y.o.   MRN: [id number] Donald Fry. Donald Fry 09/08/2014 11:32 AM Location: [facility] Patient #: [id number] DOB: [birth year] Married / Language: English / Race: American [place] or [place] Native Male History of Present Illness Donald Hollingshead Donald Fry; 09/08/2014 12:31 PM) Patient words: post-op.  The patient is a 59 year old male    Note:Procedure: Exploratory laparotomy, extended right colectomy, biopsy on peritoneal nodules of diaphragm, cholecystectomy.  Date: July 30, 2014  Pathology: Metastatic poorly differentiated adenocarcinoma  History: He is here for his first office visit. He was discharged but had to go back to the hospital and was diagnosed with C. difficile colitis. He starting to feel better now. He is eating well. His bowels are moving well. He had a postoperative bile leak and had an ERCP and stent placed. He is due to have the stent removed next week. He has an indwelling Port-A-Cath.  Exam: General- Thin, in NAD.  Abdomen-soft, incision is clean and intact.  Other Problems Donald Fry, Donald Fry; 09/08/2014 11:32 AM) Anxiety Disorder Colon Cancer Depression High blood pressure Transfusion history  Diagnostic Studies History Donald Fry, Donald Fry; 09/08/2014 11:32 AM) Colonoscopy within last year  Allergies Donald Fry, Donald Fry; 09/08/2014 11:34 AM) No Known Drug Allergies 09/08/2014  Medication History (Donald Fry, Donald Fry; 09/08/2014 11:36 AM) Aspirin EC (81MG  Tablet DR, Oral) Active. Lidocaine HCl Active. Lovastatin (40MG  Tablet, Oral) Active. Remeron (15MG  Tablet, Oral) Active. Senna S (8.6-50MG  Tablet, Oral) Active. Temazepam (15MG  Capsule, Oral) Active. Ensure (Oral) Active. Potassium Chloride ER (10MEQ Capsule ER, Oral) Active. TraMADol HCl (50MG  Tablet, Oral) Active. PredniSONE (10MG  Tablet, Oral) Active. Xanax (1MG  Tablet, Oral) Active. Medications Reconciled  Social History  Donald Fry, Donald Fry; 09/08/2014 11:32 AM) Alcohol use Remotely quit alcohol use. No caffeine use Tobacco use Former smoker.  Family History Donald Fry, Donald Fry; 09/08/2014 11:32 AM) Alcohol Abuse Donald Fry, Donald Fry. Arthritis Donald Fry, Donald Fry, Mother, Donald Fry. Cancer Donald Fry. Depression Donald Fry, Donald Fry. Diabetes Mellitus Donald Fry. Hypertension Donald Fry.     Review of Systems (Donald Fry; 09/08/2014 11:32 AM) General Present- Fatigue and Weight Gain. Not Present- Appetite Loss, Chills, Fever, Night Sweats and Weight Loss. Skin Not Present- Change in Wart/Mole, Dryness, Hives, Jaundice, New Lesions, Non-Healing Wounds, Rash and Ulcer. HEENT Not Present- Earache, Hearing Loss, Hoarseness, Nose Bleed, Oral Ulcers, Ringing in the Ears, Seasonal Allergies, Sinus Pain, Sore Throat, Visual Disturbances, Wears glasses/contact lenses and Yellow Eyes. Respiratory Not Present- Bloody sputum, Chronic Cough, Difficulty Breathing, Snoring and Wheezing. Breast Not Present- Breast Mass, Breast Pain, Nipple Discharge and Skin Changes. Cardiovascular Present- Shortness of Breath. Not Present- Chest Pain, Difficulty Breathing Lying Down, Leg Cramps, Palpitations, Rapid Heart Rate and Swelling of Extremities. Gastrointestinal Not Present- Abdominal Pain, Bloating, Bloody Stool, Change in Bowel Habits, Chronic diarrhea, Constipation, Difficulty Swallowing, Excessive gas, Gets full quickly at meals, Hemorrhoids, Indigestion, Nausea, Rectal Pain and Vomiting. Male Genitourinary Not Present- Blood in Urine, Change in Urinary Stream, Frequency, Impotence, Nocturia, Painful Urination, Urgency and Urine Leakage. Musculoskeletal Present- Muscle Weakness. Not Present- Back Pain, Joint Pain, Joint Stiffness, Muscle Pain and Swelling of Extremities. Neurological Present- Weakness. Not Present- Decreased Memory, Fainting, Headaches, Numbness, Seizures, Tingling, Tremor and Trouble walking. Psychiatric Present- Anxiety and  Change in Sleep Pattern. Not Present- Bipolar, Depression, Fearful and Frequent crying. Endocrine Present- Excessive Hunger. Not Present- Cold Intolerance, Hair Changes, Heat Intolerance, Hot flashes and New Diabetes. Hematology Not Present- Easy Bruising, Excessive bleeding, Gland problems, HIV and Persistent Infections.  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  Plan: Return to normal activities as tolerated 2 months after the surgery. Follow up prn.  Current Plans Follow up as needed Free Text Instructions : discussed with patient and provided information. CARCINOMATOSIS (199.0  C80.0)  Donald Confer, Donald Fry

## 2014-09-08 NOTE — Telephone Encounter (Signed)
Pt aware moved 4-13 to 4-20

## 2014-09-14 ENCOUNTER — Encounter (HOSPITAL_COMMUNITY): Payer: Self-pay | Admitting: Gastroenterology

## 2014-09-14 ENCOUNTER — Ambulatory Visit (HOSPITAL_COMMUNITY): Payer: Medicaid Other | Admitting: Anesthesiology

## 2014-09-14 ENCOUNTER — Ambulatory Visit (HOSPITAL_COMMUNITY): Payer: Medicaid Other

## 2014-09-14 ENCOUNTER — Encounter (HOSPITAL_COMMUNITY)
Admission: RE | Disposition: A | Discharge status: Home / Self Care | Payer: Self-pay | Source: Ambulatory Visit | Attending: Gastroenterology

## 2014-09-14 ENCOUNTER — Ambulatory Visit (HOSPITAL_COMMUNITY)
Admission: RE | Admit: 2014-09-14 | Discharge: 2014-09-14 | Disposition: A | Discharge status: Home / Self Care | Payer: Medicaid Other | Source: Ambulatory Visit | Attending: Gastroenterology | Admitting: Gastroenterology

## 2014-09-14 DIAGNOSIS — I10 Essential (primary) hypertension: Secondary | ICD-10-CM | POA: Insufficient documentation

## 2014-09-14 DIAGNOSIS — F329 Major depressive disorder, single episode, unspecified: Secondary | ICD-10-CM | POA: Insufficient documentation

## 2014-09-14 DIAGNOSIS — K9189 Other postprocedural complications and disorders of digestive system: Secondary | ICD-10-CM

## 2014-09-14 DIAGNOSIS — Z9049 Acquired absence of other specified parts of digestive tract: Secondary | ICD-10-CM | POA: Insufficient documentation

## 2014-09-14 DIAGNOSIS — K838 Other specified diseases of biliary tract: Secondary | ICD-10-CM | POA: Diagnosis not present

## 2014-09-14 DIAGNOSIS — D509 Iron deficiency anemia, unspecified: Secondary | ICD-10-CM | POA: Insufficient documentation

## 2014-09-14 DIAGNOSIS — E876 Hypokalemia: Secondary | ICD-10-CM | POA: Diagnosis not present

## 2014-09-14 DIAGNOSIS — Z4659 Encounter for fitting and adjustment of other gastrointestinal appliance and device: Secondary | ICD-10-CM | POA: Insufficient documentation

## 2014-09-14 DIAGNOSIS — Z7982 Long term (current) use of aspirin: Secondary | ICD-10-CM | POA: Diagnosis not present

## 2014-09-14 DIAGNOSIS — E785 Hyperlipidemia, unspecified: Secondary | ICD-10-CM | POA: Insufficient documentation

## 2014-09-14 DIAGNOSIS — Z87891 Personal history of nicotine dependence: Secondary | ICD-10-CM | POA: Diagnosis not present

## 2014-09-14 DIAGNOSIS — C787 Secondary malignant neoplasm of liver and intrahepatic bile duct: Secondary | ICD-10-CM

## 2014-09-14 DIAGNOSIS — K929 Disease of digestive system, unspecified: Secondary | ICD-10-CM | POA: Diagnosis not present

## 2014-09-14 DIAGNOSIS — A0472 Enterocolitis due to Clostridium difficile, not specified as recurrent: Secondary | ICD-10-CM

## 2014-09-14 DIAGNOSIS — C189 Malignant neoplasm of colon, unspecified: Secondary | ICD-10-CM | POA: Diagnosis not present

## 2014-09-14 DIAGNOSIS — F419 Anxiety disorder, unspecified: Secondary | ICD-10-CM | POA: Diagnosis not present

## 2014-09-14 HISTORY — PX: ERCP: SHX5425

## 2014-09-14 SURGERY — ERCP, WITH INTERVENTION IF INDICATED
Anesthesia: General

## 2014-09-14 MED ORDER — SODIUM CHLORIDE 0.9 % IV SOLN
INTRAVENOUS | Status: DC | PRN
  Administered 2014-09-14: 40 mL

## 2014-09-14 MED ORDER — SODIUM CHLORIDE 0.9 % IV SOLN: INTRAVENOUS | Status: DC

## 2014-09-14 MED ORDER — PROPOFOL 10 MG/ML IV BOLUS
INTRAVENOUS | Status: DC | PRN
  Administered 2014-09-14: 200 mg via INTRAVENOUS

## 2014-09-14 MED ORDER — SUCCINYLCHOLINE CHLORIDE 20 MG/ML IJ SOLN
INTRAMUSCULAR | Status: DC | PRN
  Administered 2014-09-14: 100 mg via INTRAVENOUS

## 2014-09-14 MED ORDER — GLUCAGON HCL RDNA (DIAGNOSTIC) 1 MG IJ SOLR
INTRAMUSCULAR | Status: DC | PRN
  Administered 2014-09-14: .25 mL via INTRAVENOUS

## 2014-09-14 MED ORDER — AMPICILLIN-SULBACTAM SODIUM 1.5 (1-0.5) G IJ SOLR
1.5000 g | Freq: Once | INTRAMUSCULAR | Status: AC
  Administered 2014-09-14: 1.5 g via INTRAVENOUS
  Filled 2014-09-14: qty 1.5

## 2014-09-14 MED ORDER — ONDANSETRON HCL 4 MG/2ML IJ SOLN
INTRAMUSCULAR | Status: AC
  Filled 2014-09-14: qty 2

## 2014-09-14 MED ORDER — LIDOCAINE HCL (CARDIAC) 20 MG/ML IV SOLN
INTRAVENOUS | Status: AC
  Filled 2014-09-14: qty 5

## 2014-09-14 MED ORDER — FENTANYL CITRATE 0.05 MG/ML IJ SOLN
INTRAMUSCULAR | Status: AC
  Filled 2014-09-14: qty 2

## 2014-09-14 MED ORDER — FENTANYL CITRATE 0.05 MG/ML IJ SOLN
INTRAMUSCULAR | Status: DC | PRN
  Administered 2014-09-14: 50 ug via INTRAVENOUS

## 2014-09-14 MED ORDER — GLUCAGON HCL RDNA (DIAGNOSTIC) 1 MG IJ SOLR
INTRAMUSCULAR | Status: AC
  Filled 2014-09-14: qty 1

## 2014-09-14 MED ORDER — PROPOFOL 10 MG/ML IV BOLUS
INTRAVENOUS | Status: AC
  Filled 2014-09-14: qty 20

## 2014-09-14 MED ORDER — LACTATED RINGERS IV SOLN
INTRAVENOUS | Status: DC | PRN
  Administered 2014-09-14: 09:00:00 via INTRAVENOUS

## 2014-09-14 MED ORDER — LIDOCAINE HCL (CARDIAC) 20 MG/ML IV SOLN
INTRAVENOUS | Status: DC | PRN
  Administered 2014-09-14: 50 mg via INTRAVENOUS

## 2014-09-14 MED ORDER — ONDANSETRON HCL 4 MG/2ML IJ SOLN
INTRAMUSCULAR | Status: DC | PRN
  Administered 2014-09-14: 4 mg via INTRAVENOUS

## 2014-09-14 NOTE — Anesthesia Postprocedure Evaluation (Signed)
  Anesthesia Post-op Note  Patient: Donald Fry  Procedure(s) Performed: Procedure(s) (LRB): ENDOSCOPIC RETROGRADE CHOLANGIOPANCREATOGRAPHY (ERCP) (N/A)  Patient Location: PACU  Anesthesia Type: General  Level of Consciousness: awake and alert   Airway and Oxygen Therapy: Patient Spontanous Breathing  Post-op Pain: mild  Post-op Assessment: Post-op Vital signs reviewed, Patient's Cardiovascular Status Stable, Respiratory Function Stable, Patent Airway and No signs of Nausea or vomiting  Last Vitals:  Filed Vitals:   09/14/14 1100  BP: 129/71  Pulse: 60  Temp:   Resp: 14    Post-op Vital Signs: stable   Complications: No apparent anesthesia complications

## 2014-09-14 NOTE — Discharge Instructions (Signed)
Gastrointestinal Endoscopy, Care After °Refer to this sheet in the next few weeks. These instructions provide you with information on caring for yourself after your procedure. Your caregiver may also give you more specific instructions. Your treatment has been planned according to current medical practices, but problems sometimes occur. Call your caregiver if you have any problems or questions after your procedure. °HOME CARE INSTRUCTIONS °· If you were given medicine to help you relax (sedative), do not drive, operate machinery, or sign important documents for 24 hours. °· Avoid alcohol and hot or warm beverages for the first 24 hours after the procedure. °· Only take over-the-counter or prescription medicines for pain, discomfort, or fever as directed by your caregiver. You may resume taking your normal medicines unless your caregiver tells you otherwise. Ask your caregiver when you may resume taking medicines that may cause bleeding, such as aspirin, clopidogrel, or warfarin. °· You may return to your normal diet and activities on the day after your procedure, or as directed by your caregiver. Walking may help to reduce any bloated feeling in your abdomen. °· Drink enough fluids to keep your urine clear or pale yellow. °· You may gargle with salt water if you have a sore throat. °SEEK IMMEDIATE MEDICAL CARE IF: °· You have severe nausea or vomiting. °· You have severe abdominal pain, abdominal cramps that last longer than 6 hours, or abdominal swelling (distention). °· You have severe shoulder or back pain. °· You have trouble swallowing. °· You have shortness of breath, your breathing is shallow, or you are breathing faster than normal. °· You have a fever or a rapid heartbeat. °· You vomit blood or material that looks like coffee grounds. °· You have bloody, black, or tarry stools. °MAKE SURE YOU: °· Understand these instructions. °· Will watch your condition. °· Will get help right away if you are not doing  well or get worse. °Document Released: 01/03/2004 Document Revised: 10/05/2013 Document Reviewed: 08/21/2011 °ExitCare® Patient Information ©2015 [employer]. This information is not intended to replace advice given to you by your health care provider. Make sure you discuss any questions you have with your health care provider. ° °

## 2014-09-14 NOTE — Anesthesia Procedure Notes (Signed)
Procedure Name: Intubation Date/Time: 09/14/2014 9:53 AM Performed by: Deliah Boston Pre-anesthesia Checklist: Patient identified, Emergency Drugs available, Suction available and Patient being monitored Patient Re-evaluated:Patient Re-evaluated prior to inductionOxygen Delivery Method: Circle System Utilized Preoxygenation: Pre-oxygenation with 100% oxygen Intubation Type: IV induction Ventilation: Mask ventilation without difficulty Laryngoscope Size: Mac and 4 Grade View: Grade I Tube type: Oral Number of attempts: 1 Airway Equipment and Method: Oral airway Placement Confirmation: ETT inserted through vocal cords under direct vision,  positive ETCO2 and breath sounds checked- equal and bilateral Secured at: 22 cm Tube secured with: Tape Dental Injury: Teeth and Oropharynx as per pre-operative assessment

## 2014-09-14 NOTE — Transfer of Care (Signed)
Immediate Anesthesia Transfer of Care Note  Patient: Donald Fry  Procedure(s) Performed: Procedure(s) with comments: ENDOSCOPIC RETROGRADE CHOLANGIOPANCREATOGRAPHY (ERCP) (N/A) - stent removal  Patient Location: Endo Recovery  Anesthesia Type:General  Level of Consciousness: Patient easily awoken, sedated, comfortable, cooperative, following commands, responds to stimulation.   Airway & Oxygen Therapy: Patient spontaneously breathing, ventilating well, oxygen via simple oxygen mask.  Post-op Assessment: Report given to PACU RN, vital signs reviewed and stable, moving all extremities.   Post vital signs: Reviewed and stable.  Complications: No apparent anesthesia complications

## 2014-09-14 NOTE — Op Note (Signed)
[facility] [place], [address]   ERCP PROCEDURE REPORT        EXAM DATE: 09/14/2014  PATIENT NAME:          Donald Fry, Donald Fry          MR #: [id number] BIRTHDATE:       1956-05-15     VISIT #:     [phone] ATTENDING:     Ladene Artist, MD, [facility]     STATUS:     outpatient  ASSISTANT:      Cletis Athens and Tory Emerald  INDICATIONS:  The patient is a 59 yr old male here for an ERCP due to stent removal. PROCEDURE PERFORMED:     ERCP with stent removal MEDICATIONS:     Per Anesthesia  CONSENT: The patient understands the risks and benefits of the procedure and understands that these risks include, but are not limited to: sedation, allergic reaction, infection, perforation and/or bleeding. Alternative means of evaluation and treatment include, among others: physical exam, x-rays, and/or surgical intervention. The patient elects to proceed with this endoscopic procedure.  DESCRIPTION OF PROCEDURE: During intra-op preparation period all mechanical & medical equipment was checked for proper function. Hand hygiene and appropriate measures for infection prevention was taken. After the risks, benefits and alternatives of the procedure were thoroughly explained, Informed was verified, confirmed and timeout was successfully executed by the treatment team. With the patient in left semi-prone position, medications were administered intravenously.The    was passed from the mouth into the esophagus and further advanced from the esophagus into the stomach. From stomach scope was directed to the second portion of the duodenum. Major papilla was aligned with the duodenoscope. The scope position was confirmed fluoroscopically. Rest of the findings/therapeutics are given below. The scope was then completely withdrawn from the patient and the procedure completed. The pulse, BP, and O2 saturation were monitored and documented by the physician and  the nursing staff throughout the entire procedure. The patient was cared for as planned according to standard protocol. The patient was then discharged to recovery in stable condition and with appropriate post procedure care.  The ampulla was located the second portion of the duodenum.  It appeared normal.  A previously placed plastic biliary stent was seen extending from the ampullary orifice.  The stent was removed without difficulty using a snare.  The CBD was freely cannulated with a sphinterotome and then a guidewire. Contrast injected. A balloon catheter was then placed and an occlusion cholangiogram was performed.  The biliary tree appeared normal post cholecystectomy with no evidence of leak, stones, strictures or filling defects. PD not cannulated or injected by intention.    ADVERSE EVENT:     There were no complications.  IMPRESSIONS:     1.  Previously placed biliary stent extending from the ampulla; stent removed using a snare 2.  Normal appearing post cholecystectomy cholangiogram.  RECOMMENDATIONS:     Follow-up: office PRN     Ladene Artist, MD, [facility] eSigned:  Ladene Artist, MD, [facility] 09/14/2014 10:27 AM   cc: Jackolyn Confer, M.D.       PATIENT NAME:  Donald Fry, Donald Fry MR#: [id number]

## 2014-09-14 NOTE — H&P (View-Only) (Signed)
    History of Present Illness: This is a 59 year old male accompanied by his wife. He was diagnosed with metastatic colon cancer about 4 weeks ago. He underwent resection that included a cholecystectomy. He had a subsequent bile leak and ERCP was performed with biliary stent placement. He was subsequently hospitalized for C. difficile and he has responded well to metronidazole. He started chemotherapy for metastatic colon cancer and is followed by Dr. Marin Olp. He notes his appetite is improved over the past 2 weeks and he has gained approximately 11 pounds back. He is no active GI complaints today.  Current Medications, Allergies, Past Medical History, Past Surgical History, Family History and Social History were reviewed in [employer] record.  Physical Exam: General: Well developed , well nourished, thin, chronically ill appearing no acute distress Head: Normocephalic and atraumatic Eyes:  sclerae anicteric, EOMI Ears: Normal auditory acuity Mouth: No deformity or lesions Lungs: Clear throughout to auscultation Heart: Regular rate and rhythm; no murmurs, rubs or bruits Abdomen: Soft, non tender and non distended. Well-healed midline incision. No masses, hepatosplenomegaly or hernias noted. Normal Bowel sounds Musculoskeletal: Symmetrical with no gross deformities  Pulses:  Normal pulses noted Extremities: No clubbing, cyanosis, edema or deformities noted Neurological: Alert oriented x 4, grossly nonfocal Psychological:  Alert and cooperative. Normal mood and affect  Assessment and Recommendations:  1. Bile leak post cholecystectomy. ERCP with biliary stent placed on 08/06/14. Schedule ERCP with stent removal. The risks (including bleeding, perforation, infection, missed lesions, medication reactions and possible hospitalization or surgery if complications occur), benefits, and alternatives to ERCP with possible sphincterotomy, stent removal, stent replacement were  discussed with the patient and they consent to proceed.   2. C diff. Complete entire 14 day course of metronidazole as prescribed. Begin Florastor bid for 4 weeks.   3. Colon cancer of hepatic flexure, metastatic. Post resection. Continue follow-up with Dr. Marin Olp.  4. Microcytic anemia. Likely secondary to chronic blood loss from colon cancer. Iron studies have been ordered. Ongoing follow-up with Dr. Marin Olp.

## 2014-09-14 NOTE — Interval H&P Note (Signed)
History and Physical Interval Note:  09/14/2014 8:34 AM  Donald Fry  has presented today for surgery, with the diagnosis of post-op bile leak, needs stent removal  The various methods of treatment have been discussed with the patient and family. After consideration of risks, benefits and other options for treatment, the patient has consented to  Procedure(s) with comments: ENDOSCOPIC RETROGRADE CHOLANGIOPANCREATOGRAPHY (ERCP) (N/A) - stent removal as a surgical intervention .  The patient's history has been reviewed, patient examined, no change in status, stable for surgery.  I have reviewed the patient's chart and labs.  Questions were answered to the patient's satisfaction.     Pricilla Riffle. Fuller Plan MD

## 2014-09-14 NOTE — Anesthesia Preprocedure Evaluation (Signed)
Anesthesia Evaluation  Patient identified by MRN, date of birth, ID band Patient awake    Reviewed: Allergy & Precautions, NPO status , Patient's Chart, lab work & pertinent test results  Airway Mallampati: II  TM Distance: >3 FB Neck ROM: Full    Dental no notable dental hx.    Pulmonary former smoker,  breath sounds clear to auscultation  Pulmonary exam normal       Cardiovascular hypertension, Pt. on medications Rhythm:Regular Rate:Normal     Neuro/Psych negative neurological ROS  negative psych ROS   GI/Hepatic negative GI ROS, Neg liver ROS,   Endo/Other  negative endocrine ROS  Renal/GU negative Renal ROS  negative genitourinary   Musculoskeletal negative musculoskeletal ROS (+)   Abdominal   Peds negative pediatric ROS (+)  Hematology negative hematology ROS (+)   Anesthesia Other Findings   Reproductive/Obstetrics negative OB ROS                             Anesthesia Physical Anesthesia Plan  ASA: II  Anesthesia Plan: General   Post-op Pain Management:    Induction: Intravenous  Airway Management Planned: Oral ETT  Additional Equipment:   Intra-op Plan:   Post-operative Plan: Extubation in OR  Informed Consent: I have reviewed the patients History and Physical, chart, labs and discussed the procedure including the risks, benefits and alternatives for the proposed anesthesia with the patient or authorized representative who has indicated his/her understanding and acceptance.   Dental advisory given  Plan Discussed with: CRNA and Surgeon  Anesthesia Plan Comments:         Anesthesia Quick Evaluation

## 2014-09-15 ENCOUNTER — Encounter (HOSPITAL_COMMUNITY): Payer: Self-pay | Admitting: Gastroenterology

## 2014-09-15 ENCOUNTER — Ambulatory Visit: Payer: Self-pay | Admitting: Hematology & Oncology

## 2014-09-15 ENCOUNTER — Other Ambulatory Visit: Payer: Self-pay

## 2014-09-15 ENCOUNTER — Ambulatory Visit: Payer: Self-pay

## 2014-09-22 ENCOUNTER — Ambulatory Visit (HOSPITAL_BASED_OUTPATIENT_CLINIC_OR_DEPARTMENT_OTHER): Payer: Medicaid Other | Admitting: Hematology & Oncology

## 2014-09-22 ENCOUNTER — Other Ambulatory Visit (HOSPITAL_BASED_OUTPATIENT_CLINIC_OR_DEPARTMENT_OTHER): Payer: Medicaid Other

## 2014-09-22 ENCOUNTER — Ambulatory Visit (HOSPITAL_BASED_OUTPATIENT_CLINIC_OR_DEPARTMENT_OTHER): Payer: Medicaid Other

## 2014-09-22 ENCOUNTER — Encounter: Payer: Self-pay | Admitting: Hematology & Oncology

## 2014-09-22 VITALS — BP 121/74 | HR 69 | Temp 97.6°F | Resp 69 | Ht 69.0 in | Wt 151.0 lb

## 2014-09-22 DIAGNOSIS — C183 Malignant neoplasm of hepatic flexure: Secondary | ICD-10-CM

## 2014-09-22 DIAGNOSIS — Z5111 Encounter for antineoplastic chemotherapy: Secondary | ICD-10-CM | POA: Diagnosis present

## 2014-09-22 DIAGNOSIS — C189 Malignant neoplasm of colon, unspecified: Secondary | ICD-10-CM

## 2014-09-22 DIAGNOSIS — G4701 Insomnia due to medical condition: Secondary | ICD-10-CM

## 2014-09-22 DIAGNOSIS — C786 Secondary malignant neoplasm of retroperitoneum and peritoneum: Secondary | ICD-10-CM

## 2014-09-22 DIAGNOSIS — R64 Cachexia: Secondary | ICD-10-CM

## 2014-09-22 DIAGNOSIS — C184 Malignant neoplasm of transverse colon: Secondary | ICD-10-CM

## 2014-09-22 LAB — CBC WITH DIFFERENTIAL (CANCER CENTER ONLY)
BASO#: 0.1 10*3/uL (ref 0.0–0.2)
BASO%: 0.3 % (ref 0.0–2.0)
EOS ABS: 0.3 10*3/uL (ref 0.0–0.5)
EOS%: 1.8 % (ref 0.0–7.0)
HEMATOCRIT: 42 % (ref 38.7–49.9)
HEMOGLOBIN: 13.3 g/dL (ref 13.0–17.1)
LYMPH#: 1.7 10*3/uL (ref 0.9–3.3)
LYMPH%: 11 % — ABNORMAL LOW (ref 14.0–48.0)
MCH: 26.5 pg — AB (ref 28.0–33.4)
MCHC: 31.7 g/dL — AB (ref 32.0–35.9)
MCV: 84 fL (ref 82–98)
MONO#: 0.7 10*3/uL (ref 0.1–0.9)
MONO%: 4.9 % (ref 0.0–13.0)
NEUT#: 12.4 10*3/uL — ABNORMAL HIGH (ref 1.5–6.5)
NEUT%: 82 % — AB (ref 40.0–80.0)
Platelets: 191 10*3/uL (ref 145–400)
RBC: 5.02 10*6/uL (ref 4.20–5.70)
RDW: 27 % — ABNORMAL HIGH (ref 11.1–15.7)
WBC: 15.1 10*3/uL — ABNORMAL HIGH (ref 4.0–10.0)

## 2014-09-22 LAB — IRON AND TIBC CHCC
%SAT: 52 % (ref 20–55)
Iron: 124 ug/dL (ref 42–163)
TIBC: 236 ug/dL (ref 202–409)
UIBC: 112 ug/dL — ABNORMAL LOW (ref 117–376)

## 2014-09-22 LAB — CMP (CANCER CENTER ONLY)
ALK PHOS: 78 U/L (ref 26–84)
ALT: 65 U/L — AB (ref 10–47)
AST: 36 U/L (ref 11–38)
Albumin: 3.2 g/dL — ABNORMAL LOW (ref 3.3–5.5)
BILIRUBIN TOTAL: 0.7 mg/dL (ref 0.20–1.60)
BUN, Bld: 16 mg/dL (ref 7–22)
CO2: 28 meq/L (ref 18–33)
CREATININE: 0.8 mg/dL (ref 0.6–1.2)
Calcium: 9.4 mg/dL (ref 8.0–10.3)
Chloride: 104 mEq/L (ref 98–108)
Glucose, Bld: 163 mg/dL — ABNORMAL HIGH (ref 73–118)
Potassium: 3.8 mEq/L (ref 3.3–4.7)
Sodium: 144 mEq/L (ref 128–145)
Total Protein: 6.8 g/dL (ref 6.4–8.1)

## 2014-09-22 LAB — CEA: CEA: 1.9 ng/mL (ref 0.0–5.0)

## 2014-09-22 LAB — FERRITIN CHCC: Ferritin: 189 ng/ml (ref 22–316)

## 2014-09-22 MED ORDER — SODIUM CHLORIDE 0.9 % IV SOLN
Freq: Once | INTRAVENOUS | Status: AC
  Administered 2014-09-22: 10:00:00 via INTRAVENOUS
  Filled 2014-09-22: qty 8

## 2014-09-22 MED ORDER — OXALIPLATIN CHEMO INJECTION 100 MG/20ML
68.0000 mg/m2 | Freq: Once | INTRAVENOUS | Status: AC
  Administered 2014-09-22: 120 mg via INTRAVENOUS
  Filled 2014-09-22: qty 24

## 2014-09-22 MED ORDER — DEXTROSE 5 % IV SOLN
Freq: Once | INTRAVENOUS | Status: AC
  Administered 2014-09-22: 10:00:00 via INTRAVENOUS

## 2014-09-22 MED ORDER — FLUOROURACIL CHEMO INJECTION 5 GM/100ML
2560.0000 mg/m2 | INTRAVENOUS | Status: DC
  Administered 2014-09-22: 4600 mg via INTRAVENOUS
  Filled 2014-09-22: qty 92

## 2014-09-22 MED ORDER — IRINOTECAN HCL CHEMO INJECTION 100 MG/5ML
134.0000 mg/m2 | Freq: Once | INTRAVENOUS | Status: AC
  Administered 2014-09-22: 240 mg via INTRAVENOUS
  Filled 2014-09-22: qty 12

## 2014-09-22 MED ORDER — LEUCOVORIN CALCIUM INJECTION 350 MG
200.0000 mg/m2 | Freq: Once | INTRAVENOUS | Status: AC
  Administered 2014-09-22: 358 mg via INTRAVENOUS
  Filled 2014-09-22: qty 17.9

## 2014-09-22 MED ORDER — ATROPINE SULFATE 1 MG/ML IJ SOLN: 0.5000 mg | Freq: Once | INTRAMUSCULAR | Status: DC | PRN

## 2014-09-22 NOTE — Patient Instructions (Signed)
[facility] Discharge Instructions for Patients Receiving Chemotherapy  Today you received the following chemotherapy agents Irinotecan, Oxaliplatin, 5Fu  To help prevent nausea and vomiting after your treatment, we encourage you to take your nausea medication    If you develop nausea and vomiting that is not controlled by your nausea medication, call the clinic.   BELOW ARE SYMPTOMS THAT SHOULD BE REPORTED IMMEDIATELY:  *FEVER GREATER THAN 100.5 F  *CHILLS WITH OR WITHOUT FEVER  NAUSEA AND VOMITING THAT IS NOT CONTROLLED WITH YOUR NAUSEA MEDICATION  *UNUSUAL SHORTNESS OF BREATH  *UNUSUAL BRUISING OR BLEEDING  TENDERNESS IN MOUTH AND THROAT WITH OR WITHOUT PRESENCE OF ULCERS  *URINARY PROBLEMS  *BOWEL PROBLEMS  UNUSUAL RASH Items with * indicate a potential emergency and should be followed up as soon as possible.  Feel free to call the clinic you have any questions or concerns. The clinic phone number is [phone].  Please show the [facility] at check-in to the Emergency Department and triage nurse.

## 2014-09-22 NOTE — Progress Notes (Signed)
Hematology and Oncology Follow Up Visit  Donald Fry [id number] 10-08-1955 59 y.o. 09/22/2014   Principle Diagnosis:  Metastatic colon cancer- KRAS wild type  Current Therapy:    Status post cycle 1 of FOLFOXIRI     Interim History:  Donald Fry is back for follow-up. He looks so much better. He is walking in. He's gained weight.  He was hospitalized after his first cycle of chemotherapy. Monitor exactly as to what happened but he did not have any obstruction. He's having some pain issues.  He is not hurting. He is going to the bathroom. He's had no nausea or vomiting.  I'm just very impressed with how well he looks right now.  He was on Megace. He is not taking this any longer because he is eating well.  There is no leg swelling. He's had no rashes. He's had no cough. He's had no shortness of breath.  Currently, his performance status is ECOG 1.  Medications:  Current outpatient prescriptions:  .  ALPRAZolam (XANAX) 1 MG tablet, Take 1 tablet (1 mg total) by mouth 4 (four) times daily as needed for anxiety., Disp: 30 tablet, Rfl: 0 .  aspirin 81 MG tablet, Take 81 mg by mouth daily., Disp: , Rfl:  .  atenolol (TENORMIN) 100 MG tablet, Take 100 mg by mouth daily., Disp: , Rfl:  .  lidocaine-prilocaine (EMLA) cream, Apply to affected area once, Disp: 30 g, Rfl: 3 .  lovastatin (MEVACOR) 40 MG tablet, Take 40 mg by mouth at bedtime., Disp: , Rfl:  .  mirtazapine (REMERON) 15 MG tablet, Take 0.5-1 tablets (7.5-15 mg total) by mouth at bedtime. (Patient taking differently: Take 7.5 mg by mouth 2 (two) times daily. ), Disp: 30 tablet, Rfl: 3 .  multivitamin-iron-minerals-folic acid (CENTRUM) chewable tablet, Chew 1 tablet by mouth daily., Disp: 30 tablet, Rfl: 0 .  potassium chloride (K-DUR) 10 MEQ tablet, Take 1 tablet (10 mEq total) by mouth daily., Disp: 20 tablet, Rfl: 0 .  predniSONE (DELTASONE) 10 MG tablet, Take 1 tablet (10 mg total) by mouth daily with breakfast., Disp: 30  tablet, Rfl: 0 .  senna (SENOKOT) 8.6 MG TABS tablet, Take 1 tablet (8.6 mg total) by mouth daily as needed for mild constipation., Disp: 120 each, Rfl: 0 .  Skin Protectants, Misc. (EUCERIN) cream, Apply 1 application topically daily. Applied to hands and feet, Disp: , Rfl:  .  temazepam (RESTORIL) 15 MG capsule, Take 1 capsule (15 mg total) by mouth at bedtime as needed for sleep., Disp: 30 capsule, Rfl: 2 .  traMADol (ULTRAM) 50 MG tablet, Take 1 tablet (50 mg total) by mouth every 6 (six) hours as needed for severe pain., Disp: 100 tablet, Rfl: 0 .  prochlorperazine (COMPAZINE) 10 MG tablet, Take 1 tablet (10 mg total) by mouth every 6 (six) hours as needed (Nausea or vomiting). (Patient not taking: Reported on 09/22/2014), Disp: 30 tablet, Rfl: 1 No current facility-administered medications for this visit.  Facility-Administered Medications Ordered in Other Visits:  .  atropine injection 0.5 mg, 0.5 mg, Intravenous, Once PRN, Volanda Napoleon, MD .  fluorouracil (ADRUCIL) 4,600 mg in sodium chloride 0.9 % 58 mL chemo infusion, 2,560 mg/m2 (Treatment Plan Actual), Intravenous, 1 day or 1 dose, Volanda Napoleon, MD, 4,600 mg at 09/22/14 1435  Allergies: No Known Allergies  Past Medical History, Surgical history, Social history, and Family History were reviewed and updated.  Review of Systems: As above  Physical Exam:  height is  _0  (1.753 m) and weight is 151 lb (68.493 kg). His oral temperature is 97.6 F (36.4 C). His blood pressure is 121/74 and his pulse is 69. His respiration is 69.   Wt Readings from Last 3 Encounters:  09/22/14 151 lb (68.493 kg)  09/08/14 146 lb (66.225 kg)  08/29/14 137 lb 3.2 oz (62.234 kg)     Well-developed and well-nourished white gentleman in no obvious distress. Head and neck exam shows no ocular or oral lesions. There are no palpable cervical or supraclavicular lymph nodes. Lungs are clear. Cardiac exam regular rate and rhythm with no murmurs, rubs or  bruits. Abdomen is soft. He has good bowel sounds. There is no fluid wave. There is no guarding or rebound tenderness. He has no palpable abdominal mass. Is no palpable liver or spleen tip. Back exam shows no tenderness over the spine, ribs or hips. Extremities shows no clubbing, cyanosis or edema. Neurological exam shows no focal neurological deficits. Skin exam no rashes, ecchymoses or petechia.  Lab Results  Component Value Date   WBC 15.1* 09/22/2014   HGB 13.3 09/22/2014   HCT 42.0 09/22/2014   MCV 84 09/22/2014   PLT 191 09/22/2014     Chemistry      Component Value Date/Time   NA 144 09/22/2014 0856   NA 136 09/01/2014 0400   K 3.8 09/22/2014 0856   K 3.6 09/01/2014 0400   CL 104 09/22/2014 0856   CL 105 09/01/2014 0400   CO2 28 09/22/2014 0856   CO2 24 09/01/2014 0400   BUN 16 09/22/2014 0856   BUN 9 09/01/2014 0400   CREATININE 0.8 09/22/2014 0856   CREATININE 0.54 09/01/2014 0400      Component Value Date/Time   CALCIUM 9.4 09/22/2014 0856   CALCIUM 8.4 09/01/2014 0400   ALKPHOS 78 09/22/2014 0856   ALKPHOS 60 09/01/2014 0400   AST 36 09/22/2014 0856   AST 23 09/01/2014 0400   ALT 65* 09/22/2014 0856   ALT 13 09/01/2014 0400   BILITOT 0.70 09/22/2014 0856   BILITOT 1.1 09/01/2014 0400         Impression and Plan: Donald Fry is 59 year old gentleman with metastatic colorectal cancer. He has extensive peritoneal disease.  Again, I have to believe that he is responding. He just looks so much better.  His CEA has never been that elevated so we really cannot use that as a marker for response or progression.  For now, I will still plan for a follow-up scan after 4 cycles of treatment.  We will plan to get him back in 2 more weeks for his third cycle.  I'm just very happy that his quality of life is better and that he feels better area   Volanda Napoleon, MD 4/20/20165:32 PM

## 2014-09-23 ENCOUNTER — Other Ambulatory Visit: Payer: Self-pay | Admitting: Emergency Medicine

## 2014-09-24 ENCOUNTER — Ambulatory Visit (HOSPITAL_BASED_OUTPATIENT_CLINIC_OR_DEPARTMENT_OTHER): Payer: Medicaid Other

## 2014-09-24 ENCOUNTER — Other Ambulatory Visit: Payer: Self-pay | Admitting: *Deleted

## 2014-09-24 VITALS — BP 116/72 | HR 67 | Temp 97.9°F | Resp 20 | Wt 150.0 lb

## 2014-09-24 DIAGNOSIS — C183 Malignant neoplasm of hepatic flexure: Secondary | ICD-10-CM | POA: Diagnosis not present

## 2014-09-24 DIAGNOSIS — C189 Malignant neoplasm of colon, unspecified: Secondary | ICD-10-CM

## 2014-09-24 MED ORDER — HEPARIN SOD (PORK) LOCK FLUSH 100 UNIT/ML IV SOLN
500.0000 [IU] | Freq: Once | INTRAVENOUS | Status: AC | PRN
  Administered 2014-09-24: 500 [IU]
  Filled 2014-09-24: qty 5

## 2014-09-24 MED ORDER — ALPRAZOLAM 1 MG PO TABS: 1.0000 mg | ORAL_TABLET | Freq: Four times a day (QID) | ORAL | Status: DC | PRN

## 2014-09-24 MED ORDER — SODIUM CHLORIDE 0.9 % IJ SOLN
10.0000 mL | INTRAMUSCULAR | Status: DC | PRN
  Administered 2014-09-24: 10 mL
  Filled 2014-09-24: qty 10

## 2014-09-24 NOTE — Progress Notes (Signed)
Dr. Marin Olp notified of presence of chemo left in pump bag. OK to DC.

## 2014-09-24 NOTE — Patient Instructions (Signed)

## 2014-09-28 ENCOUNTER — Encounter: Payer: Self-pay | Admitting: Gastroenterology

## 2014-10-06 ENCOUNTER — Ambulatory Visit (HOSPITAL_BASED_OUTPATIENT_CLINIC_OR_DEPARTMENT_OTHER): Payer: Medicaid Other

## 2014-10-06 ENCOUNTER — Ambulatory Visit (HOSPITAL_BASED_OUTPATIENT_CLINIC_OR_DEPARTMENT_OTHER): Payer: Medicaid Other | Admitting: Family

## 2014-10-06 ENCOUNTER — Other Ambulatory Visit (HOSPITAL_BASED_OUTPATIENT_CLINIC_OR_DEPARTMENT_OTHER): Payer: Medicaid Other

## 2014-10-06 ENCOUNTER — Encounter: Payer: Self-pay | Admitting: Family

## 2014-10-06 VITALS — BP 124/75 | HR 67 | Temp 97.7°F | Resp 14 | Ht 69.0 in | Wt 160.0 lb

## 2014-10-06 DIAGNOSIS — C786 Secondary malignant neoplasm of retroperitoneum and peritoneum: Secondary | ICD-10-CM | POA: Diagnosis not present

## 2014-10-06 DIAGNOSIS — C189 Malignant neoplasm of colon, unspecified: Secondary | ICD-10-CM

## 2014-10-06 DIAGNOSIS — C183 Malignant neoplasm of hepatic flexure: Secondary | ICD-10-CM

## 2014-10-06 DIAGNOSIS — Z5111 Encounter for antineoplastic chemotherapy: Secondary | ICD-10-CM | POA: Diagnosis present

## 2014-10-06 LAB — CBC WITH DIFFERENTIAL (CANCER CENTER ONLY)
BASO#: 0 10*3/uL (ref 0.0–0.2)
BASO%: 0.5 % (ref 0.0–2.0)
EOS ABS: 0.1 10*3/uL (ref 0.0–0.5)
EOS%: 0.9 % (ref 0.0–7.0)
HCT: 37.3 % — ABNORMAL LOW (ref 38.7–49.9)
HGB: 11.8 g/dL — ABNORMAL LOW (ref 13.0–17.1)
LYMPH#: 1.5 10*3/uL (ref 0.9–3.3)
LYMPH%: 17 % (ref 14.0–48.0)
MCH: 27.1 pg — ABNORMAL LOW (ref 28.0–33.4)
MCHC: 31.6 g/dL — ABNORMAL LOW (ref 32.0–35.9)
MCV: 86 fL (ref 82–98)
MONO#: 0.7 10*3/uL (ref 0.1–0.9)
MONO%: 8.2 % (ref 0.0–13.0)
NEUT%: 73.4 % (ref 40.0–80.0)
NEUTROS ABS: 6.4 10*3/uL (ref 1.5–6.5)
PLATELETS: 272 10*3/uL (ref 145–400)
RBC: 4.36 10*6/uL (ref 4.20–5.70)
RDW: 26.7 % — ABNORMAL HIGH (ref 11.1–15.7)
WBC: 8.7 10*3/uL (ref 4.0–10.0)

## 2014-10-06 LAB — CMP (CANCER CENTER ONLY)
ALK PHOS: 73 U/L (ref 26–84)
ALT(SGPT): 40 U/L (ref 10–47)
AST: 25 U/L (ref 11–38)
Albumin: 3 g/dL — ABNORMAL LOW (ref 3.3–5.5)
BUN: 15 mg/dL (ref 7–22)
CHLORIDE: 103 meq/L (ref 98–108)
CO2: 32 mEq/L (ref 18–33)
CREATININE: 0.8 mg/dL (ref 0.6–1.2)
Calcium: 9.3 mg/dL (ref 8.0–10.3)
GLUCOSE: 116 mg/dL (ref 73–118)
POTASSIUM: 3.8 meq/L (ref 3.3–4.7)
Sodium: 142 mEq/L (ref 128–145)
Total Bilirubin: 0.5 mg/dl (ref 0.20–1.60)
Total Protein: 6.2 g/dL — ABNORMAL LOW (ref 6.4–8.1)

## 2014-10-06 LAB — TECHNOLOGIST REVIEW CHCC SATELLITE

## 2014-10-06 MED ORDER — DEXTROSE 5 % IV SOLN
Freq: Once | INTRAVENOUS | Status: AC
  Administered 2014-10-06: 09:00:00 via INTRAVENOUS

## 2014-10-06 MED ORDER — ATROPINE SULFATE 1 MG/ML IJ SOLN
0.5000 mg | Freq: Once | INTRAMUSCULAR | Status: AC | PRN
  Administered 2014-10-06: 0.5 mg via INTRAVENOUS

## 2014-10-06 MED ORDER — DEXTROSE 5 % IV SOLN
76.5000 mg/m2 | Freq: Once | INTRAVENOUS | Status: AC
  Administered 2014-10-06: 135 mg via INTRAVENOUS
  Filled 2014-10-06: qty 27

## 2014-10-06 MED ORDER — ATROPINE SULFATE 1 MG/ML IJ SOLN
INTRAMUSCULAR | Status: AC
  Filled 2014-10-06: qty 1

## 2014-10-06 MED ORDER — LEUCOVORIN CALCIUM INJECTION 350 MG
200.0000 mg/m2 | Freq: Once | INTRAMUSCULAR | Status: AC
  Administered 2014-10-06: 358 mg via INTRAVENOUS
  Filled 2014-10-06: qty 17.9

## 2014-10-06 MED ORDER — DEXTROSE 5 % IV SOLN
148.5000 mg/m2 | Freq: Once | INTRAVENOUS | Status: AC
  Administered 2014-10-06: 266 mg via INTRAVENOUS
  Filled 2014-10-06: qty 4.43

## 2014-10-06 MED ORDER — SODIUM CHLORIDE 0.9 % IV SOLN
Freq: Once | INTRAVENOUS | Status: AC
  Administered 2014-10-06: 10:00:00 via INTRAVENOUS
  Filled 2014-10-06: qty 8

## 2014-10-06 MED ORDER — SODIUM CHLORIDE 0.9 % IV SOLN
2900.0000 mg/m2 | INTRAVENOUS | Status: DC
  Administered 2014-10-06: 5200 mg via INTRAVENOUS
  Filled 2014-10-06: qty 104

## 2014-10-06 NOTE — Progress Notes (Signed)
Hematology and Oncology Follow Up Visit  Donald Fry [id number] 04-14-1956 59 y.o. 10/06/2014   Principle Diagnosis:  Metastatic colon cancer- KRAS wild type  Current Therapy:   Status post cycle 2 of FOLFOXIRI    Interim History:  Donald Fry is here today with his wife for follow-up and cycle 3 of treatment. He is doing much better and has gained another 9 lbs. His appetite is good and he is staying hydrated. This has been encouraging for him.   He denies fever, chills, n/v, cough, rash, dizziness, SOB, chest pain, palpitations, abdominal pain, constipation, diarrhea, blood in urine or stool. No lymphadenopathy on assessment. No episodes of bleeding or bruising.   He has no swelling, tenderness, numbness or tingling in his extremities. No new aches or pains.   His CEA in April was 1.9.   Medications:    Medication List       This list is accurate as of: 10/06/14  8:40 AM.  Always use your most recent med list.               ALPRAZolam 1 MG tablet  Commonly known as:  XANAX  Take 1 tablet (1 mg total) by mouth 4 (four) times daily as needed for anxiety.     aspirin 81 MG tablet  Take 81 mg by mouth daily.     atenolol 100 MG tablet  Commonly known as:  TENORMIN  Take 100 mg by mouth daily.     eucerin cream  Apply 1 application topically daily. Applied to hands and feet     lidocaine-prilocaine cream  Commonly known as:  EMLA  Apply to affected area once     lovastatin 40 MG tablet  Commonly known as:  MEVACOR  Take 40 mg by mouth at bedtime.     mirtazapine 15 MG tablet  Commonly known as:  REMERON  Take 0.5-1 tablets (7.5-15 mg total) by mouth at bedtime.     multivitamin-iron-minerals-folic acid chewable tablet  Chew 1 tablet by mouth daily.     predniSONE 10 MG tablet  Commonly known as:  DELTASONE  Take 1 tablet (10 mg total) by mouth daily with breakfast.     prochlorperazine 10 MG tablet  Commonly known as:  COMPAZINE  Take 1 tablet (10 mg total)  by mouth every 6 (six) hours as needed (Nausea or vomiting).     senna 8.6 MG Tabs tablet  Commonly known as:  SENOKOT  Take 1 tablet (8.6 mg total) by mouth daily as needed for mild constipation.     temazepam 15 MG capsule  Commonly known as:  RESTORIL  Take 1 capsule (15 mg total) by mouth at bedtime as needed for sleep.     traMADol 50 MG tablet  Commonly known as:  ULTRAM  Take 1 tablet (50 mg total) by mouth every 6 (six) hours as needed for severe pain.        Allergies: No Known Allergies  Past Medical History, Surgical history, Social history, and Family History were reviewed and updated.  Review of Systems: All other 10 point review of systems is negative.   Physical Exam:  height is $RemoveB'5\' 9"'ZKHvOrmI$  (1.753 m) and weight is 160 lb (72.576 kg). His oral temperature is 97.7 F (36.5 C). His blood pressure is 124/75 and his pulse is 67. His respiration is 14.   Wt Readings from Last 3 Encounters:  10/06/14 160 lb (72.576 kg)  09/24/14 150 lb (68.04 kg)  09/22/14  151 lb (68.493 kg)    Ocular: Sclerae unicteric, pupils equal, round and reactive to light Ear-nose-throat: Oropharynx clear, dentition fair Lymphatic: No cervical or supraclavicular adenopathy Lungs no rales or rhonchi, good excursion bilaterally Heart regular rate and rhythm, no murmur appreciated Abd soft, nontender, positive bowel sounds MSK no focal spinal tenderness, no joint edema Neuro: non-focal, well-oriented, appropriate affect  Lab Results  Component Value Date   WBC 15.1* 09/22/2014   HGB 13.3 09/22/2014   HCT 42.0 09/22/2014   MCV 84 09/22/2014   PLT 191 09/22/2014   Lab Results  Component Value Date   FERRITIN 189 09/22/2014   IRON 124 09/22/2014   TIBC 236 09/22/2014   UIBC 112* 09/22/2014   IRONPCTSAT 52 09/22/2014   Lab Results  Component Value Date   RBC 5.02 09/22/2014   No results found for: KPAFRELGTCHN, LAMBDASER, KAPLAMBRATIO No results found for: Kandis Cocking, IGMSERUM No  results found for: Odetta Pink, SPEI   Chemistry      Component Value Date/Time   NA 144 09/22/2014 0856   NA 136 09/01/2014 0400   K 3.8 09/22/2014 0856   K 3.6 09/01/2014 0400   CL 104 09/22/2014 0856   CL 105 09/01/2014 0400   CO2 28 09/22/2014 0856   CO2 24 09/01/2014 0400   BUN 16 09/22/2014 0856   BUN 9 09/01/2014 0400   CREATININE 0.8 09/22/2014 0856   CREATININE 0.54 09/01/2014 0400      Component Value Date/Time   CALCIUM 9.4 09/22/2014 0856   CALCIUM 8.4 09/01/2014 0400   ALKPHOS 78 09/22/2014 0856   ALKPHOS 60 09/01/2014 0400   AST 36 09/22/2014 0856   AST 23 09/01/2014 0400   ALT 65* 09/22/2014 0856   ALT 13 09/01/2014 0400   BILITOT 0.70 09/22/2014 0856   BILITOT 1.1 09/01/2014 0400     Impression and Plan: Donald Fry is a 59 year old gentleman with metastatic colorectal cancer. He has extensive peritoneal disease. He seems to be doing well. He is gaining weight and eating. He is asymptomatic at this time.   We will proceed with cycle 3 today. We plan to complete 4 cycles of treatment and then repeat his scan.   We will give him his appointment schedule for cycle 4 in 2 weeks.   He and his wife know to call us with any questions or concerns. We can certainly see him sooner if need be.   [name], NP 5/4/20168:40 AM

## 2014-10-06 NOTE — Patient Instructions (Signed)
[facility] Discharge Instructions for Patients Receiving Chemotherapy  Today you received the following chemotherapy agents Camptosar, Leucovorin, Oxaliplatin and 5FU.  To help prevent nausea and vomiting after your treatment, we encourage you to take your nausea medication.   If you develop nausea and vomiting that is not controlled by your nausea medication, call the clinic.   BELOW ARE SYMPTOMS THAT SHOULD BE REPORTED IMMEDIATELY:  *FEVER GREATER THAN 100.5 F  *CHILLS WITH OR WITHOUT FEVER  NAUSEA AND VOMITING THAT IS NOT CONTROLLED WITH YOUR NAUSEA MEDICATION  *UNUSUAL SHORTNESS OF BREATH  *UNUSUAL BRUISING OR BLEEDING  TENDERNESS IN MOUTH AND THROAT WITH OR WITHOUT PRESENCE OF ULCERS  *URINARY PROBLEMS  *BOWEL PROBLEMS  UNUSUAL RASH Items with * indicate a potential emergency and should be followed up as soon as possible.  Feel free to call the clinic you have any questions or concerns. The clinic phone number is [phone].  Please show the [name] at check-in to the Emergency Department and triage nurse.

## 2014-10-08 ENCOUNTER — Ambulatory Visit (HOSPITAL_BASED_OUTPATIENT_CLINIC_OR_DEPARTMENT_OTHER): Payer: Medicaid Other

## 2014-10-08 VITALS — BP 111/71 | HR 62 | Temp 97.8°F | Resp 16 | Wt 160.0 lb

## 2014-10-08 DIAGNOSIS — Z452 Encounter for adjustment and management of vascular access device: Secondary | ICD-10-CM

## 2014-10-08 DIAGNOSIS — C184 Malignant neoplasm of transverse colon: Secondary | ICD-10-CM

## 2014-10-08 DIAGNOSIS — C189 Malignant neoplasm of colon, unspecified: Secondary | ICD-10-CM

## 2014-10-08 DIAGNOSIS — C183 Malignant neoplasm of hepatic flexure: Secondary | ICD-10-CM

## 2014-10-08 MED ORDER — SODIUM CHLORIDE 0.9 % IJ SOLN
10.0000 mL | INTRAMUSCULAR | Status: DC | PRN
  Administered 2014-10-08: 10 mL via INTRAVENOUS
  Filled 2014-10-08: qty 10

## 2014-10-08 MED ORDER — HEPARIN SOD (PORK) LOCK FLUSH 100 UNIT/ML IV SOLN
500.0000 [IU] | Freq: Once | INTRAVENOUS | Status: AC
  Administered 2014-10-08: 500 [IU] via INTRAVENOUS
  Filled 2014-10-08: qty 5

## 2014-10-08 NOTE — Addendum Note (Signed)
Addended by: Cordelia Poche on: 10/08/2014 02:06 PM   Modules accepted: Orders, SmartSet

## 2014-10-08 NOTE — Patient Instructions (Signed)
[facility] Discharge Instructions for Patients Receiving Chemotherapy   To help prevent nausea and vomiting after your treatment, we encourage you to take your nausea medication    If you develop nausea and vomiting that is not controlled by your nausea medication, call the clinic.   BELOW ARE SYMPTOMS THAT SHOULD BE REPORTED IMMEDIATELY:  *FEVER GREATER THAN 100.5 F  *CHILLS WITH OR WITHOUT FEVER  NAUSEA AND VOMITING THAT IS NOT CONTROLLED WITH YOUR NAUSEA MEDICATION  *UNUSUAL SHORTNESS OF BREATH  *UNUSUAL BRUISING OR BLEEDING  TENDERNESS IN MOUTH AND THROAT WITH OR WITHOUT PRESENCE OF ULCERS  *URINARY PROBLEMS  *BOWEL PROBLEMS  UNUSUAL RASH Items with * indicate a potential emergency and should be followed up as soon as possible.  Feel free to call the clinic you have any questions or concerns. The clinic phone number is [phone].  Please show the CHEMO ALERT CARD at check-in to the Emergency Department and triage nurse.   

## 2014-10-20 ENCOUNTER — Ambulatory Visit (HOSPITAL_BASED_OUTPATIENT_CLINIC_OR_DEPARTMENT_OTHER): Payer: Medicaid Other | Admitting: Hematology & Oncology

## 2014-10-20 ENCOUNTER — Encounter: Payer: Self-pay | Admitting: Hematology & Oncology

## 2014-10-20 ENCOUNTER — Other Ambulatory Visit (HOSPITAL_BASED_OUTPATIENT_CLINIC_OR_DEPARTMENT_OTHER): Payer: Medicaid Other

## 2014-10-20 ENCOUNTER — Ambulatory Visit (HOSPITAL_BASED_OUTPATIENT_CLINIC_OR_DEPARTMENT_OTHER): Payer: Medicaid Other

## 2014-10-20 VITALS — BP 130/75 | HR 66 | Temp 97.7°F | Resp 18 | Ht 68.0 in | Wt 164.0 lb

## 2014-10-20 DIAGNOSIS — C786 Secondary malignant neoplasm of retroperitoneum and peritoneum: Secondary | ICD-10-CM | POA: Diagnosis not present

## 2014-10-20 DIAGNOSIS — C189 Malignant neoplasm of colon, unspecified: Secondary | ICD-10-CM

## 2014-10-20 DIAGNOSIS — Z5111 Encounter for antineoplastic chemotherapy: Secondary | ICD-10-CM | POA: Diagnosis present

## 2014-10-20 DIAGNOSIS — C183 Malignant neoplasm of hepatic flexure: Secondary | ICD-10-CM | POA: Diagnosis not present

## 2014-10-20 LAB — CMP (CANCER CENTER ONLY)
ALK PHOS: 68 U/L (ref 26–84)
ALT(SGPT): 40 U/L (ref 10–47)
AST: 27 U/L (ref 11–38)
Albumin: 3.3 g/dL (ref 3.3–5.5)
BUN, Bld: 17 mg/dL (ref 7–22)
CHLORIDE: 105 meq/L (ref 98–108)
CO2: 30 meq/L (ref 18–33)
Calcium: 9.7 mg/dL (ref 8.0–10.3)
Creat: 0.7 mg/dl (ref 0.6–1.2)
GLUCOSE: 125 mg/dL — AB (ref 73–118)
Potassium: 3.7 mEq/L (ref 3.3–4.7)
SODIUM: 142 meq/L (ref 128–145)
Total Bilirubin: 0.6 mg/dl (ref 0.20–1.60)
Total Protein: 6.6 g/dL (ref 6.4–8.1)

## 2014-10-20 LAB — CBC WITH DIFFERENTIAL (CANCER CENTER ONLY)
BASO#: 0.1 10*3/uL (ref 0.0–0.2)
BASO%: 0.6 % (ref 0.0–2.0)
EOS%: 0.7 % (ref 0.0–7.0)
Eosinophils Absolute: 0.1 10*3/uL (ref 0.0–0.5)
HEMATOCRIT: 37.1 % — AB (ref 38.7–49.9)
HEMOGLOBIN: 12.2 g/dL — AB (ref 13.0–17.1)
LYMPH#: 1.7 10*3/uL (ref 0.9–3.3)
LYMPH%: 21.6 % (ref 14.0–48.0)
MCH: 28.8 pg (ref 28.0–33.4)
MCHC: 32.9 g/dL (ref 32.0–35.9)
MCV: 88 fL (ref 82–98)
MONO#: 0.7 10*3/uL (ref 0.1–0.9)
MONO%: 8.1 % (ref 0.0–13.0)
NEUT#: 5.6 10*3/uL (ref 1.5–6.5)
NEUT%: 69 % (ref 40.0–80.0)
Platelets: 168 10*3/uL (ref 145–400)
RBC: 4.24 10*6/uL (ref 4.20–5.70)
RDW: 27.2 % — AB (ref 11.1–15.7)
WBC: 8.1 10*3/uL (ref 4.0–10.0)

## 2014-10-20 LAB — CEA: CEA: 2.4 ng/mL (ref 0.0–5.0)

## 2014-10-20 MED ORDER — LEUCOVORIN CALCIUM INJECTION 350 MG
196.0000 mg/m2 | Freq: Once | INTRAVENOUS | Status: AC
  Administered 2014-10-20: 350 mg via INTRAVENOUS
  Filled 2014-10-20: qty 17.5

## 2014-10-20 MED ORDER — ATROPINE SULFATE 1 MG/ML IJ SOLN
0.5000 mg | Freq: Once | INTRAMUSCULAR | Status: AC | PRN
  Administered 2014-10-20: 0.5 mg via INTRAVENOUS

## 2014-10-20 MED ORDER — HEPARIN SOD (PORK) LOCK FLUSH 100 UNIT/ML IV SOLN
500.0000 [IU] | Freq: Once | INTRAVENOUS | Status: DC | PRN
  Filled 2014-10-20: qty 5

## 2014-10-20 MED ORDER — SODIUM CHLORIDE 0.9 % IV SOLN
Freq: Once | INTRAVENOUS | Status: AC
  Administered 2014-10-20: 09:00:00 via INTRAVENOUS
  Filled 2014-10-20: qty 8

## 2014-10-20 MED ORDER — IRINOTECAN HCL CHEMO INJECTION 100 MG/5ML
145.0000 mg/m2 | Freq: Once | INTRAVENOUS | Status: AC
  Administered 2014-10-20: 260 mg via INTRAVENOUS
  Filled 2014-10-20: qty 8

## 2014-10-20 MED ORDER — ATROPINE SULFATE 1 MG/ML IJ SOLN
INTRAMUSCULAR | Status: AC
  Filled 2014-10-20: qty 1

## 2014-10-20 MED ORDER — SODIUM CHLORIDE 0.9 % IJ SOLN
10.0000 mL | INTRAMUSCULAR | Status: DC | PRN
  Filled 2014-10-20: qty 10

## 2014-10-20 MED ORDER — DEXTROSE 5 % IV SOLN
Freq: Once | INTRAVENOUS | Status: AC
  Administered 2014-10-20: 09:00:00 via INTRAVENOUS

## 2014-10-20 MED ORDER — OXALIPLATIN CHEMO INJECTION 100 MG/20ML
76.5000 mg/m2 | Freq: Once | INTRAVENOUS | Status: AC
  Administered 2014-10-20: 135 mg via INTRAVENOUS
  Filled 2014-10-20: qty 27

## 2014-10-20 MED ORDER — FLUOROURACIL CHEMO INJECTION 5 GM/100ML
2900.0000 mg/m2 | INTRAVENOUS | Status: DC
  Administered 2014-10-20: 5200 mg via INTRAVENOUS
  Filled 2014-10-20: qty 104

## 2014-10-20 NOTE — Patient Instructions (Signed)
[facility] Discharge Instructions for Patients Receiving Chemotherapy  Today you received the following chemotherapy agents Camptosar, Leucovorin, Oxaliplatin and 5FU.  To help prevent nausea and vomiting after your treatment, we encourage you to take your nausea medication.   If you develop nausea and vomiting that is not controlled by your nausea medication, call the clinic.   BELOW ARE SYMPTOMS THAT SHOULD BE REPORTED IMMEDIATELY:  *FEVER GREATER THAN 100.5 F  *CHILLS WITH OR WITHOUT FEVER  NAUSEA AND VOMITING THAT IS NOT CONTROLLED WITH YOUR NAUSEA MEDICATION  *UNUSUAL SHORTNESS OF BREATH  *UNUSUAL BRUISING OR BLEEDING  TENDERNESS IN MOUTH AND THROAT WITH OR WITHOUT PRESENCE OF ULCERS  *URINARY PROBLEMS  *BOWEL PROBLEMS  UNUSUAL RASH Items with * indicate a potential emergency and should be followed up as soon as possible.  Feel free to call the clinic you have any questions or concerns. The clinic phone number is [phone].  Please show the [place] at check-in to the Emergency Department and triage nurse.

## 2014-10-20 NOTE — Progress Notes (Signed)
Hematology and Oncology Follow Up Visit  Donald Fry [id number] Oct 02, 1955 59 y.o. 10/20/2014   Principle Diagnosis:  Metastatic colon cancer- KRAS wild type  Current Therapy:   Status post cycle 3 of FOLFOXIRI    Interim History:  Mr. Donald Fry is here today with his wife for follow-up . He really looks great. He is gaining weight. He's had no abdominal pain. There's been no nausea or vomiting. He's had no cough. He's had no leg swelling.  His quality of life is much better. His performance status is much better (ECOG 1).  He's had no issues going to the bathroom. There's been no diarrhea.  He's had no rashes. His been no tingling in his hands or feet.  His CEA in April was 1.9.   Medications:    Medication List       This list is accurate as of: 10/20/14  8:37 AM.  Always use your most recent med list.               ALPRAZolam 1 MG tablet  Commonly known as:  XANAX  Take 1 tablet (1 mg total) by mouth 4 (four) times daily as needed for anxiety.     aspirin 81 MG tablet  Take 81 mg by mouth daily.     atenolol 100 MG tablet  Commonly known as:  TENORMIN  Take 100 mg by mouth daily.     eucerin cream  Apply 1 application topically daily. Applied to hands and feet     lidocaine-prilocaine cream  Commonly known as:  EMLA  Apply to affected area once     lovastatin 40 MG tablet  Commonly known as:  MEVACOR  Take 40 mg by mouth at bedtime.     mirtazapine 15 MG tablet  Commonly known as:  REMERON  Take 0.5-1 tablets (7.5-15 mg total) by mouth at bedtime.     multivitamin-iron-minerals-folic acid chewable tablet  Chew 1 tablet by mouth daily.     predniSONE 10 MG tablet  Commonly known as:  DELTASONE  Take 1 tablet (10 mg total) by mouth daily with breakfast.     prochlorperazine 10 MG tablet  Commonly known as:  COMPAZINE  Take 1 tablet (10 mg total) by mouth every 6 (six) hours as needed (Nausea or vomiting).     senna 8.6 MG Tabs tablet  Commonly  known as:  SENOKOT  Take 1 tablet (8.6 mg total) by mouth daily as needed for mild constipation.     temazepam 15 MG capsule  Commonly known as:  RESTORIL  Take 1 capsule (15 mg total) by mouth at bedtime as needed for sleep.     traMADol 50 MG tablet  Commonly known as:  ULTRAM  Take 1 tablet (50 mg total) by mouth every 6 (six) hours as needed for severe pain.        Allergies: No Known Allergies  Past Medical History, Surgical history, Social history, and Family History were reviewed and updated.  Review of Systems: All other 10 point review of systems is negative.   Physical Exam:  height is _0  (1.727 m) and weight is 164 lb (74.39 kg). His oral temperature is 97.7 F (36.5 C). His blood pressure is 130/75 and his pulse is 66. His respiration is 18.   Wt Readings from Last 3 Encounters:  10/20/14 164 lb (74.39 kg)  10/08/14 160 lb (72.576 kg)  10/06/14 160 lb (72.576 kg)    Well-developed and well-nourished white  woman in no obvious distress. Head and neck exam shows no ocular or oral lesions. He has no mucositis. He has no scleral icterus. There is no adenopathy in the neck. Lungs are clear. Cardiac exam regular rate and rhythm with no murmurs, rubs or bruits. Abdomen is soft. He has good bowel sounds. There is no fluid wave. There is no palpable abdominal mass. Has well-healed laparotomy scar. He has no palpable liver or spleen tip. Extremities shows no clubbing, cyanosis or edema. Skin exam shows no rashes, ecchymoses or petechia.  Lab Results  Component Value Date   WBC 8.1 10/20/2014   HGB 12.2* 10/20/2014   HCT 37.1* 10/20/2014   MCV 88 10/20/2014   PLT 168 10/20/2014   Lab Results  Component Value Date   FERRITIN 189 09/22/2014   IRON 124 09/22/2014   TIBC 236 09/22/2014   UIBC 112* 09/22/2014   IRONPCTSAT 52 09/22/2014   Lab Results  Component Value Date   RBC 4.24 10/20/2014   No results found for: KPAFRELGTCHN, LAMBDASER, KAPLAMBRATIO No results  found for: Kandis Cocking, IGMSERUM No results found for: Odetta Pink, SPEI   Chemistry      Component Value Date/Time   NA 142 10/06/2014 0811   NA 136 09/01/2014 0400   K 3.8 10/06/2014 0811   K 3.6 09/01/2014 0400   CL 103 10/06/2014 0811   CL 105 09/01/2014 0400   CO2 32 10/06/2014 0811   CO2 24 09/01/2014 0400   BUN 15 10/06/2014 0811   BUN 9 09/01/2014 0400   CREATININE 0.8 10/06/2014 0811   CREATININE 0.54 09/01/2014 0400      Component Value Date/Time   CALCIUM 9.3 10/06/2014 0811   CALCIUM 8.4 09/01/2014 0400   ALKPHOS 73 10/06/2014 0811   ALKPHOS 60 09/01/2014 0400   AST 25 10/06/2014 0811   AST 23 09/01/2014 0400   ALT 40 10/06/2014 0811   ALT 13 09/01/2014 0400   BILITOT 0.50 10/06/2014 0811   BILITOT 1.1 09/01/2014 0400     Impression and Plan: Mr. Donald Fry is a 59 year old gentleman with metastatic colorectal cancer. He has extensive peritoneal disease. His weight gain tells me everything I need to know. He is responding to treatment quite nicely.  We will continue to treat him. After this cycle of treatment, I will repeat his scans.  I will give him an extra week off now. I think he definitely deserves it. I don't see that this is going to put him at any disadvantage.  We will get his scans in 2 weeks. I will see him back in 3 weeks.  I spent about 25-30 minutes with he and his wife. I went over his lab work and our plans for follow-up and scans.    Volanda Napoleon, MD 5/18/20168:37 AM

## 2014-10-22 ENCOUNTER — Ambulatory Visit (HOSPITAL_BASED_OUTPATIENT_CLINIC_OR_DEPARTMENT_OTHER): Payer: Medicaid Other

## 2014-10-22 ENCOUNTER — Other Ambulatory Visit: Payer: Self-pay | Admitting: *Deleted

## 2014-10-22 VITALS — BP 124/75 | HR 59 | Temp 98.2°F | Resp 18

## 2014-10-22 DIAGNOSIS — C183 Malignant neoplasm of hepatic flexure: Secondary | ICD-10-CM

## 2014-10-22 DIAGNOSIS — C189 Malignant neoplasm of colon, unspecified: Secondary | ICD-10-CM

## 2014-10-22 MED ORDER — SODIUM CHLORIDE 0.9 % IJ SOLN
10.0000 mL | INTRAMUSCULAR | Status: DC | PRN
  Administered 2014-10-22: 10 mL
  Filled 2014-10-22: qty 10

## 2014-10-22 MED ORDER — ALPRAZOLAM 1 MG PO TABS: 1.0000 mg | ORAL_TABLET | Freq: Four times a day (QID) | ORAL | Status: DC | PRN

## 2014-10-22 MED ORDER — HEPARIN SOD (PORK) LOCK FLUSH 100 UNIT/ML IV SOLN
500.0000 [IU] | Freq: Once | INTRAVENOUS | Status: AC | PRN
  Administered 2014-10-22: 500 [IU]
  Filled 2014-10-22: qty 5

## 2014-10-22 NOTE — Patient Instructions (Signed)

## 2014-11-02 ENCOUNTER — Ambulatory Visit (HOSPITAL_BASED_OUTPATIENT_CLINIC_OR_DEPARTMENT_OTHER)
Admission: RE | Admit: 2014-11-02 | Discharge: 2014-11-02 | Disposition: A | Discharge status: Home / Self Care | Payer: Medicaid Other | Source: Ambulatory Visit | Attending: Hematology & Oncology | Admitting: Hematology & Oncology

## 2014-11-02 ENCOUNTER — Ambulatory Visit (HOSPITAL_BASED_OUTPATIENT_CLINIC_OR_DEPARTMENT_OTHER): Payer: Medicaid Other

## 2014-11-02 ENCOUNTER — Encounter (HOSPITAL_BASED_OUTPATIENT_CLINIC_OR_DEPARTMENT_OTHER): Payer: Self-pay

## 2014-11-02 VITALS — BP 139/78 | HR 58 | Temp 97.7°F | Resp 20

## 2014-11-02 DIAGNOSIS — C786 Secondary malignant neoplasm of retroperitoneum and peritoneum: Secondary | ICD-10-CM

## 2014-11-02 DIAGNOSIS — Z9049 Acquired absence of other specified parts of digestive tract: Secondary | ICD-10-CM | POA: Diagnosis not present

## 2014-11-02 DIAGNOSIS — C183 Malignant neoplasm of hepatic flexure: Secondary | ICD-10-CM

## 2014-11-02 DIAGNOSIS — Z452 Encounter for adjustment and management of vascular access device: Secondary | ICD-10-CM

## 2014-11-02 DIAGNOSIS — C189 Malignant neoplasm of colon, unspecified: Secondary | ICD-10-CM

## 2014-11-02 DIAGNOSIS — R911 Solitary pulmonary nodule: Secondary | ICD-10-CM | POA: Diagnosis not present

## 2014-11-02 MED ORDER — HEPARIN SOD (PORK) LOCK FLUSH 100 UNIT/ML IV SOLN
500.0000 [IU] | Freq: Once | INTRAVENOUS | Status: AC
  Administered 2014-11-02: 500 [IU] via INTRAVENOUS
  Filled 2014-11-02: qty 5

## 2014-11-02 MED ORDER — IOHEXOL 300 MG/ML  SOLN
100.0000 mL | Freq: Once | INTRAMUSCULAR | Status: AC | PRN
  Administered 2014-11-02: 100 mL via INTRAVENOUS

## 2014-11-02 MED ORDER — SODIUM CHLORIDE 0.9 % IJ SOLN
10.0000 mL | INTRAMUSCULAR | Status: DC | PRN
  Administered 2014-11-02: 10 mL via INTRAVENOUS
  Filled 2014-11-02: qty 10

## 2014-11-02 NOTE — Patient Instructions (Signed)

## 2014-11-03 ENCOUNTER — Ambulatory Visit: Payer: Medicaid Other

## 2014-11-03 ENCOUNTER — Ambulatory Visit: Payer: Medicaid Other | Admitting: Family

## 2014-11-03 ENCOUNTER — Other Ambulatory Visit: Payer: Medicaid Other

## 2014-11-09 ENCOUNTER — Other Ambulatory Visit: Payer: Self-pay | Admitting: *Deleted

## 2014-11-09 DIAGNOSIS — C189 Malignant neoplasm of colon, unspecified: Secondary | ICD-10-CM

## 2014-11-10 ENCOUNTER — Other Ambulatory Visit (HOSPITAL_BASED_OUTPATIENT_CLINIC_OR_DEPARTMENT_OTHER): Payer: Medicaid Other

## 2014-11-10 ENCOUNTER — Ambulatory Visit (HOSPITAL_BASED_OUTPATIENT_CLINIC_OR_DEPARTMENT_OTHER): Payer: Medicaid Other

## 2014-11-10 ENCOUNTER — Ambulatory Visit (HOSPITAL_BASED_OUTPATIENT_CLINIC_OR_DEPARTMENT_OTHER): Payer: Medicaid Other | Admitting: Hematology & Oncology

## 2014-11-10 VITALS — BP 130/75 | HR 64 | Temp 97.7°F | Resp 20 | Wt 174.0 lb

## 2014-11-10 DIAGNOSIS — C786 Secondary malignant neoplasm of retroperitoneum and peritoneum: Secondary | ICD-10-CM | POA: Diagnosis not present

## 2014-11-10 DIAGNOSIS — Z5111 Encounter for antineoplastic chemotherapy: Secondary | ICD-10-CM | POA: Diagnosis not present

## 2014-11-10 DIAGNOSIS — C189 Malignant neoplasm of colon, unspecified: Secondary | ICD-10-CM

## 2014-11-10 LAB — CBC WITH DIFFERENTIAL (CANCER CENTER ONLY)
BASO#: 0 10*3/uL (ref 0.0–0.2)
BASO%: 0.4 % (ref 0.0–2.0)
EOS%: 0.5 % (ref 0.0–7.0)
Eosinophils Absolute: 0.1 10*3/uL (ref 0.0–0.5)
HCT: 37.2 % — ABNORMAL LOW (ref 38.7–49.9)
HEMOGLOBIN: 12.1 g/dL — AB (ref 13.0–17.1)
LYMPH#: 1 10*3/uL (ref 0.9–3.3)
LYMPH%: 10 % — AB (ref 14.0–48.0)
MCH: 31.4 pg (ref 28.0–33.4)
MCHC: 32.5 g/dL (ref 32.0–35.9)
MCV: 97 fL (ref 82–98)
MONO#: 0.7 10*3/uL (ref 0.1–0.9)
MONO%: 6.9 % (ref 0.0–13.0)
NEUT%: 82.2 % — AB (ref 40.0–80.0)
NEUTROS ABS: 8.6 10*3/uL — AB (ref 1.5–6.5)
PLATELETS: 202 10*3/uL (ref 145–400)
RBC: 3.85 10*6/uL — AB (ref 4.20–5.70)
RDW: 27.1 % — ABNORMAL HIGH (ref 11.1–15.7)
WBC: 10.4 10*3/uL — ABNORMAL HIGH (ref 4.0–10.0)

## 2014-11-10 LAB — CMP (CANCER CENTER ONLY)
ALT(SGPT): 62 U/L — ABNORMAL HIGH (ref 10–47)
AST: 45 U/L — AB (ref 11–38)
Albumin: 3.2 g/dL — ABNORMAL LOW (ref 3.3–5.5)
Alkaline Phosphatase: 67 U/L (ref 26–84)
BUN, Bld: 14 mg/dL (ref 7–22)
CHLORIDE: 102 meq/L (ref 98–108)
CO2: 29 mEq/L (ref 18–33)
CREATININE: 0.7 mg/dL (ref 0.6–1.2)
Calcium: 9.3 mg/dL (ref 8.0–10.3)
Glucose, Bld: 151 mg/dL — ABNORMAL HIGH (ref 73–118)
POTASSIUM: 4.4 meq/L (ref 3.3–4.7)
Sodium: 140 mEq/L (ref 128–145)
TOTAL PROTEIN: 6.2 g/dL — AB (ref 6.4–8.1)
Total Bilirubin: 0.6 mg/dl (ref 0.20–1.60)

## 2014-11-10 MED ORDER — FLUOROURACIL CHEMO INJECTION 5 GM/100ML
2900.0000 mg/m2 | INTRAVENOUS | Status: DC
  Administered 2014-11-10: 5200 mg via INTRAVENOUS
  Filled 2014-11-10: qty 104

## 2014-11-10 MED ORDER — HEPARIN SOD (PORK) LOCK FLUSH 100 UNIT/ML IV SOLN
500.0000 [IU] | Freq: Once | INTRAVENOUS | Status: DC | PRN
  Filled 2014-11-10: qty 5

## 2014-11-10 MED ORDER — SODIUM CHLORIDE 0.9 % IV SOLN
Freq: Once | INTRAVENOUS | Status: AC
  Administered 2014-11-10: 11:00:00 via INTRAVENOUS
  Filled 2014-11-10: qty 8

## 2014-11-10 MED ORDER — ATROPINE SULFATE 1 MG/ML IJ SOLN
INTRAMUSCULAR | Status: AC
  Filled 2014-11-10: qty 1

## 2014-11-10 MED ORDER — LEUCOVORIN CALCIUM INJECTION 350 MG
196.0000 mg/m2 | Freq: Once | INTRAVENOUS | Status: AC
  Administered 2014-11-10: 350 mg via INTRAVENOUS
  Filled 2014-11-10: qty 17.5

## 2014-11-10 MED ORDER — DEXTROSE 5 % IV SOLN
Freq: Once | INTRAVENOUS | Status: AC
  Administered 2014-11-10: 11:00:00 via INTRAVENOUS

## 2014-11-10 MED ORDER — IRINOTECAN HCL CHEMO INJECTION 100 MG/5ML
145.0000 mg/m2 | Freq: Once | INTRAVENOUS | Status: AC
  Administered 2014-11-10: 260 mg via INTRAVENOUS
  Filled 2014-11-10: qty 13

## 2014-11-10 MED ORDER — ATROPINE SULFATE 1 MG/ML IJ SOLN
0.5000 mg | Freq: Once | INTRAMUSCULAR | Status: AC | PRN
  Administered 2014-11-10: 0.5 mg via INTRAVENOUS

## 2014-11-10 MED ORDER — OXALIPLATIN CHEMO INJECTION 100 MG/20ML
76.5000 mg/m2 | Freq: Once | INTRAVENOUS | Status: AC
  Administered 2014-11-10: 135 mg via INTRAVENOUS
  Filled 2014-11-10: qty 27

## 2014-11-10 MED ORDER — SODIUM CHLORIDE 0.9 % IJ SOLN
10.0000 mL | INTRAMUSCULAR | Status: DC | PRN
  Filled 2014-11-10: qty 10

## 2014-11-10 NOTE — Patient Instructions (Signed)
[facility] Discharge Instructions for Patients Receiving Chemotherapy  Today you received the following chemotherapy agents Oxaliplatin, Irinotecan  To help prevent nausea and vomiting after your treatment, we encourage you to take your nausea medication    If you develop nausea and vomiting that is not controlled by your nausea medication, call the clinic.   BELOW ARE SYMPTOMS THAT SHOULD BE REPORTED IMMEDIATELY:  *FEVER GREATER THAN 100.5 F  *CHILLS WITH OR WITHOUT FEVER  NAUSEA AND VOMITING THAT IS NOT CONTROLLED WITH YOUR NAUSEA MEDICATION  *UNUSUAL SHORTNESS OF BREATH  *UNUSUAL BRUISING OR BLEEDING  TENDERNESS IN MOUTH AND THROAT WITH OR WITHOUT PRESENCE OF ULCERS  *URINARY PROBLEMS  *BOWEL PROBLEMS  UNUSUAL RASH Items with * indicate a potential emergency and should be followed up as soon as possible.  Feel free to call the clinic you have any questions or concerns. The clinic phone number is [phone].  Please show the [facility] at check-in to the Emergency Department and triage nurse.

## 2014-11-10 NOTE — Progress Notes (Signed)
Hematology and Oncology Follow Up Visit  SY SAINTJEAN [id number] 11-04-1955 59 y.o. 11/10/2014   Principle Diagnosis:  Metastatic colon cancer- KRAS wild type  Current Therapy:   Status post cycle 4 of FOLFOXIRI    Interim History:  Donald Fry is here today with his wife for follow-up . He continues looks great. His weight is now 174 pounds.  We did go ahead and repeat a CT scan on him. This was done a week or so ago. The CT scan did not show any obvious malignancy. there is no obvious peritoneal disease. There is no hepatic metastases. His lungs looked okay. There is no effusions.  I would have to say that he has had a good response. His weight is going up. He is more active. His performance status is much better.  His CEA has never been all that elevated. In May, his CEA was 2.4.  He's had no issues with bowels or bladder. He's had no leg swelling. He's had no rashes.  Overall, his performance status is ECOG 1.   Medications:    Medication List       This list is accurate as of: 11/10/14 10:52 AM.  Always use your most recent med list.               ALPRAZolam 1 MG tablet  Commonly known as:  XANAX  Take 1 tablet (1 mg total) by mouth 4 (four) times daily as needed for anxiety.     aspirin 81 MG tablet  Take 81 mg by mouth daily.     atenolol 100 MG tablet  Commonly known as:  TENORMIN  Take 100 mg by mouth daily.     eucerin cream  Apply 1 application topically daily. Applied to hands and feet     lidocaine-prilocaine cream  Commonly known as:  EMLA  Apply to affected area once     lovastatin 40 MG tablet  Commonly known as:  MEVACOR  Take 40 mg by mouth at bedtime.     mirtazapine 15 MG tablet  Commonly known as:  REMERON  Take 0.5-1 tablets (7.5-15 mg total) by mouth at bedtime.     multivitamin-iron-minerals-folic acid chewable tablet  Chew 1 tablet by mouth daily.     ondansetron 8 MG tablet  Commonly known as:  ZOFRAN  Take 8 mg by mouth 2 (two)  times daily. Take one tab two times a day, start the day after chemo for 3 days, then as needed for n&v.     predniSONE 10 MG tablet  Commonly known as:  DELTASONE  Take 1 tablet (10 mg total) by mouth daily with breakfast.     prochlorperazine 10 MG tablet  Commonly known as:  COMPAZINE  Take 1 tablet (10 mg total) by mouth every 6 (six) hours as needed (Nausea or vomiting).     senna 8.6 MG Tabs tablet  Commonly known as:  SENOKOT  Take 1 tablet (8.6 mg total) by mouth daily as needed for mild constipation.     temazepam 15 MG capsule  Commonly known as:  RESTORIL  Take 1 capsule (15 mg total) by mouth at bedtime as needed for sleep.     traMADol 50 MG tablet  Commonly known as:  ULTRAM  Take 1 tablet (50 mg total) by mouth every 6 (six) hours as needed for severe pain.        Allergies: No Known Allergies  Past Medical History, Surgical history, Social history, and  Family History were reviewed and updated.  Review of Systems: All other 10 point review of systems is negative.   Physical Exam:  weight is 174 lb (78.926 kg). His oral temperature is 97.7 F (36.5 C). His blood pressure is 130/75 and his pulse is 64. His respiration is 20.   Wt Readings from Last 3 Encounters:  11/10/14 174 lb (78.926 kg)  10/20/14 164 lb (74.39 kg)  10/08/14 160 lb (72.576 kg)    Well-developed and well-nourished white woman in no obvious distress. Head and neck exam shows no ocular or oral lesions. He has no mucositis. He has no scleral icterus. There is no adenopathy in the neck. Lungs are clear. Cardiac exam regular rate and rhythm with no murmurs, rubs or bruits. Abdomen is soft. He has good bowel sounds. There is no fluid wave. There is no palpable abdominal mass. Has well-healed laparotomy scar. He has no palpable liver or spleen tip. Extremities shows no clubbing, cyanosis or edema. Skin exam shows no rashes, ecchymoses or petechia.  Lab Results  Component Value Date   WBC 10.4*  11/10/2014   HGB 12.1* 11/10/2014   HCT 37.2* 11/10/2014   MCV 97 11/10/2014   PLT 202 11/10/2014   Lab Results  Component Value Date   FERRITIN 189 09/22/2014   IRON 124 09/22/2014   TIBC 236 09/22/2014   UIBC 112* 09/22/2014   IRONPCTSAT 52 09/22/2014   Lab Results  Component Value Date   RBC 3.85* 11/10/2014   No results found for: KPAFRELGTCHN, LAMBDASER, KAPLAMBRATIO No results found for: IGGSERUM, IGA, IGMSERUM No results found for: Odetta Pink, SPEI   Chemistry      Component Value Date/Time   NA 140 11/10/2014 0925   NA 136 09/01/2014 0400   K 4.4 11/10/2014 0925   K 3.6 09/01/2014 0400   CL 102 11/10/2014 0925   CL 105 09/01/2014 0400   CO2 29 11/10/2014 0925   CO2 24 09/01/2014 0400   BUN 14 11/10/2014 0925   BUN 9 09/01/2014 0400   CREATININE 0.7 11/10/2014 0925   CREATININE 0.54 09/01/2014 0400      Component Value Date/Time   CALCIUM 9.3 11/10/2014 0925   CALCIUM 8.4 09/01/2014 0400   ALKPHOS 67 11/10/2014 0925   ALKPHOS 60 09/01/2014 0400   AST 45* 11/10/2014 0925   AST 23 09/01/2014 0400   ALT 62* 11/10/2014 0925   ALT 13 09/01/2014 0400   BILITOT 0.60 11/10/2014 0925   BILITOT 1.1 09/01/2014 0400     Impression and Plan: Donald Fry is a 59 year old gentleman with metastatic colorectal cancer. He had extensive peritoneal disease. His weight gain tells me everything I need to know. He is responding to treatment quite nicely.  We will continue to treat him. We will go with 4 more cycles. I think everything was good after I told 8 cycles of treatment, then we might consider maintenance type therapy for him.  He is not on Avastin or K-RAS inhibitor. We certainly could add one of these with maintenance therapy.    Volanda Napoleon, MD 6/8/201610:52 AM

## 2014-11-11 ENCOUNTER — Other Ambulatory Visit: Payer: Self-pay | Admitting: Hematology & Oncology

## 2014-11-12 ENCOUNTER — Ambulatory Visit (HOSPITAL_BASED_OUTPATIENT_CLINIC_OR_DEPARTMENT_OTHER): Payer: Medicaid Other

## 2014-11-12 VITALS — BP 114/70 | HR 64 | Temp 97.5°F | Resp 18

## 2014-11-12 DIAGNOSIS — C189 Malignant neoplasm of colon, unspecified: Secondary | ICD-10-CM

## 2014-11-12 DIAGNOSIS — Z452 Encounter for adjustment and management of vascular access device: Secondary | ICD-10-CM

## 2014-11-12 MED ORDER — HEPARIN SOD (PORK) LOCK FLUSH 100 UNIT/ML IV SOLN
250.0000 [IU] | Freq: Once | INTRAVENOUS | Status: DC | PRN
  Filled 2014-11-12: qty 5

## 2014-11-12 MED ORDER — SODIUM CHLORIDE 0.9 % IJ SOLN
10.0000 mL | INTRAMUSCULAR | Status: DC | PRN
  Filled 2014-11-12: qty 10

## 2014-11-12 MED ORDER — ALTEPLASE 2 MG IJ SOLR
2.0000 mg | Freq: Once | INTRAMUSCULAR | Status: DC | PRN
  Filled 2014-11-12: qty 2

## 2014-11-12 MED ORDER — SODIUM CHLORIDE 0.9 % IJ SOLN
3.0000 mL | INTRAMUSCULAR | Status: DC | PRN
  Filled 2014-11-12: qty 10

## 2014-11-12 MED ORDER — HEPARIN SOD (PORK) LOCK FLUSH 100 UNIT/ML IV SOLN
500.0000 [IU] | Freq: Once | INTRAVENOUS | Status: DC | PRN
  Filled 2014-11-12: qty 5

## 2014-11-12 NOTE — Patient Instructions (Signed)
Fluorouracil, 5FU; Diclofenac topical cream What is this medicine? FLUOROURACIL; DICLOFENAC (flure oh YOOR a sil; dye KLOE fen ak) is a combination of a topical chemotherapy agent and non-steroidal anti-inflammatory drug (NSAID). It is used on the skin to treat skin cancer and skin conditions that could become cancer. This medicine may be used for other purposes; ask your health care provider or pharmacist if you have questions. COMMON BRAND NAME(S): FLUORAC What should I tell my health care provider before I take this medicine? They need to know if you have any of these conditions: -bleeding problems -cigarette smoker -DPD enzyme deficiency -heart disease -high blood pressure -if you frequently drink alcohol containing drinks -kidney disease -liver disease -open or infected skin -stomach problems -swelling or open sores at the treatment site -recent or planned coronary artery bypass graft (CABG) surgery -an unusual or allergic reaction to fluorouracil, diclofenac, aspirin, other NSAIDs, other medicines, foods, dyes, or preservatives -pregnant or trying to get pregnant -breast-feeding How should I use this medicine? This medicine is only for use on the skin. Follow the directions on the prescription label. Wash hands before and after use. Wash affected area and gently pat dry. To apply this medicine use a cotton-tipped applicator, or use gloves if applying with fingertips. If applied with unprotected fingertips, it is very important to wash your hands well after you apply this medicine. Avoid applying to the eyes, nose, or mouth. Apply enough medicine to cover the affected area. You can cover the area with a light gauze dressing, but do not use tight or air-tight dressings. Finish the full course prescribed by your doctor or health care professional, even if you think your condition is better. Do not stop taking except on the advice of your doctor or health care professional. Talk to your  pediatrician regarding the use of this medicine in children. Special care may be needed. Overdosage: If you think you've taken too much of this medicine contact a poison control center or emergency room at once. Overdosage: If you think you have taken too much of this medicine contact a poison control center or emergency room at once. NOTE: This medicine is only for you. Do not share this medicine with others. What if I miss a dose? If you miss a dose, apply it as soon as you can. If it is almost time for your next dose, only use that dose. Do not apply extra doses. Contact your doctor or health care professional if you miss more than one dose. What may interact with this medicine? Interactions are not expected. Do not use any other skin products without telling your doctor or health care professional. This list may not describe all possible interactions. Give your health care provider a list of all the medicines, herbs, non-prescription drugs, or dietary supplements you use. Also tell them if you smoke, drink alcohol, or use illegal drugs. Some items may interact with your medicine. What should I watch for while using this medicine? Visit your doctor or health care professional for checks on your progress. You will need to use this medicine for 2 to 6 weeks. This may be longer depending on the condition being treated. You may not see full healing for another 1 to 2 months after you stop using the medicine. Treated areas of skin can look unsightly during and for several weeks after treatment with this medicine. This medicine can make you more sensitive to the sun. Keep out of the sun. If you cannot avoid being in   the sun, wear protective clothing and use sunscreen. Do not use sun lamps or tanning beds/booths. Where should I keep my What side effects may I notice from receiving this medicine? Side effects that you should report to your doctor or health care professional as soon as possible: -allergic  reactions like skin rash, itching or hives, swelling of the face, lips, or tongue -black or bloody stools, blood in the urine or vomit -blurred vision -chest pain -difficulty breathing or wheezing -redness, blistering, peeling or loosening of the skin, including inside the mouth -severe redness and swelling of normal skin -slurred speech or weakness on one side of the body -trouble passing urine or change in the amount of urine -unexplained weight gain or swelling -unusually weak or tired -yellowing of eyes or skin Side effects that usually do not require medical attention (Report these to your doctor or health care professional if they continue or are bothersome.): -increased sensitivity of the skin to sun and ultraviolet light -pain and burning of the affected area -scaling or swelling of the affected area -skin rash, itching of the affected area -tenderness This list may not describe all possible side effects. Call your doctor for medical advice about side effects. You may report side effects to FDA at 1-800-FDA-1088. Where should I keep my medicine? Keep out of the reach of children. Store at room temperature between 20 and 25 degrees C (68 and 77 degrees F). Throw away any unused medicine after the expiration date. NOTE: This sheet is a summary. It may not cover all possible information. If you have questions about this medicine, talk to your doctor, pharmacist, or health care provider.  2015, [employer] Standard. (2013-09-21 11:09:58)  

## 2014-11-17 ENCOUNTER — Ambulatory Visit: Payer: Medicaid Other | Admitting: Hematology & Oncology

## 2014-11-17 ENCOUNTER — Other Ambulatory Visit: Payer: Medicaid Other

## 2014-11-17 ENCOUNTER — Ambulatory Visit: Payer: Medicaid Other

## 2014-11-18 ENCOUNTER — Other Ambulatory Visit: Payer: Self-pay | Admitting: Hematology & Oncology

## 2014-11-24 ENCOUNTER — Ambulatory Visit (HOSPITAL_BASED_OUTPATIENT_CLINIC_OR_DEPARTMENT_OTHER): Payer: Medicaid Other | Admitting: Hematology & Oncology

## 2014-11-24 ENCOUNTER — Other Ambulatory Visit (HOSPITAL_BASED_OUTPATIENT_CLINIC_OR_DEPARTMENT_OTHER): Payer: Medicaid Other

## 2014-11-24 ENCOUNTER — Encounter: Payer: Self-pay | Admitting: Hematology & Oncology

## 2014-11-24 ENCOUNTER — Ambulatory Visit (HOSPITAL_BASED_OUTPATIENT_CLINIC_OR_DEPARTMENT_OTHER): Payer: Medicaid Other

## 2014-11-24 ENCOUNTER — Other Ambulatory Visit: Payer: Self-pay | Admitting: *Deleted

## 2014-11-24 VITALS — BP 122/69 | HR 63 | Temp 97.7°F | Resp 20 | Ht 68.0 in | Wt 176.0 lb

## 2014-11-24 DIAGNOSIS — C183 Malignant neoplasm of hepatic flexure: Secondary | ICD-10-CM

## 2014-11-24 DIAGNOSIS — C786 Secondary malignant neoplasm of retroperitoneum and peritoneum: Secondary | ICD-10-CM

## 2014-11-24 DIAGNOSIS — C189 Malignant neoplasm of colon, unspecified: Secondary | ICD-10-CM

## 2014-11-24 DIAGNOSIS — Z5111 Encounter for antineoplastic chemotherapy: Secondary | ICD-10-CM | POA: Diagnosis present

## 2014-11-24 LAB — CBC WITH DIFFERENTIAL (CANCER CENTER ONLY)
BASO#: 0 10*3/uL (ref 0.0–0.2)
BASO%: 0.5 % (ref 0.0–2.0)
EOS ABS: 0.1 10*3/uL (ref 0.0–0.5)
EOS%: 1 % (ref 0.0–7.0)
HEMATOCRIT: 37 % — AB (ref 38.7–49.9)
HGB: 12.4 g/dL — ABNORMAL LOW (ref 13.0–17.1)
LYMPH#: 1.1 10*3/uL (ref 0.9–3.3)
LYMPH%: 14.6 % (ref 14.0–48.0)
MCH: 33.2 pg (ref 28.0–33.4)
MCHC: 33.5 g/dL (ref 32.0–35.9)
MCV: 99 fL — AB (ref 82–98)
MONO#: 0.7 10*3/uL (ref 0.1–0.9)
MONO%: 8.7 % (ref 0.0–13.0)
NEUT#: 5.8 10*3/uL (ref 1.5–6.5)
NEUT%: 75.2 % (ref 40.0–80.0)
Platelets: 205 10*3/uL (ref 145–400)
RBC: 3.73 10*6/uL — ABNORMAL LOW (ref 4.20–5.70)
RDW: 24.5 % — ABNORMAL HIGH (ref 11.1–15.7)
WBC: 7.7 10*3/uL (ref 4.0–10.0)

## 2014-11-24 LAB — CMP (CANCER CENTER ONLY)
ALT: 62 U/L — AB (ref 10–47)
AST: 26 U/L (ref 11–38)
Albumin: 3.2 g/dL — ABNORMAL LOW (ref 3.3–5.5)
Alkaline Phosphatase: 65 U/L (ref 26–84)
BILIRUBIN TOTAL: 0.8 mg/dL (ref 0.20–1.60)
BUN, Bld: 17 mg/dL (ref 7–22)
CO2: 29 mEq/L (ref 18–33)
CREATININE: 0.6 mg/dL (ref 0.6–1.2)
Calcium: 9.4 mg/dL (ref 8.0–10.3)
Chloride: 105 mEq/L (ref 98–108)
Glucose, Bld: 116 mg/dL (ref 73–118)
Potassium: 3.9 mEq/L (ref 3.3–4.7)
SODIUM: 138 meq/L (ref 128–145)
Total Protein: 6.2 g/dL — ABNORMAL LOW (ref 6.4–8.1)

## 2014-11-24 LAB — CEA: CEA: 1.6 ng/mL (ref 0.0–5.0)

## 2014-11-24 MED ORDER — ATROPINE SULFATE 1 MG/ML IJ SOLN
0.5000 mg | Freq: Once | INTRAMUSCULAR | Status: AC | PRN
  Administered 2014-11-24: 0.5 mg via INTRAVENOUS

## 2014-11-24 MED ORDER — SODIUM CHLORIDE 0.9 % IV SOLN
Freq: Once | INTRAVENOUS | Status: AC
  Administered 2014-11-24: 09:00:00 via INTRAVENOUS
  Filled 2014-11-24: qty 8

## 2014-11-24 MED ORDER — DEXTROSE 5 % IV SOLN
260.0000 mg | Freq: Once | INTRAVENOUS | Status: AC
  Administered 2014-11-24: 260 mg via INTRAVENOUS
  Filled 2014-11-24: qty 10

## 2014-11-24 MED ORDER — ALPRAZOLAM 1 MG PO TABS: 1.0000 mg | ORAL_TABLET | Freq: Four times a day (QID) | ORAL | Status: DC | PRN

## 2014-11-24 MED ORDER — SODIUM CHLORIDE 0.9 % IJ SOLN
10.0000 mL | INTRAMUSCULAR | Status: DC | PRN
  Filled 2014-11-24: qty 10

## 2014-11-24 MED ORDER — DEXTROSE 5 % IV SOLN
Freq: Once | INTRAVENOUS | Status: AC
  Administered 2014-11-24: 09:00:00 via INTRAVENOUS

## 2014-11-24 MED ORDER — OXALIPLATIN CHEMO INJECTION 100 MG/20ML
76.5000 mg/m2 | Freq: Once | INTRAVENOUS | Status: AC
  Administered 2014-11-24: 135 mg via INTRAVENOUS
  Filled 2014-11-24: qty 20

## 2014-11-24 MED ORDER — HEPARIN SOD (PORK) LOCK FLUSH 100 UNIT/ML IV SOLN
500.0000 [IU] | Freq: Once | INTRAVENOUS | Status: DC | PRN
  Filled 2014-11-24: qty 5

## 2014-11-24 MED ORDER — SODIUM CHLORIDE 0.9 % IV SOLN
2900.0000 mg/m2 | INTRAVENOUS | Status: DC
  Administered 2014-11-24: 5200 mg via INTRAVENOUS
  Filled 2014-11-24: qty 104

## 2014-11-24 MED ORDER — ATROPINE SULFATE 1 MG/ML IJ SOLN
INTRAMUSCULAR | Status: AC
  Filled 2014-11-24: qty 1

## 2014-11-24 MED ORDER — LEUCOVORIN CALCIUM INJECTION 350 MG
350.0000 mg | Freq: Once | INTRAVENOUS | Status: AC
  Administered 2014-11-24: 350 mg via INTRAVENOUS
  Filled 2014-11-24: qty 17.5

## 2014-11-24 NOTE — Patient Instructions (Signed)
[facility] Discharge Instructions for Patients Receiving Chemotherapy  Today you received the following chemotherapy agents Oxaliplatin, Irinotecan  To help prevent nausea and vomiting after your treatment, we encourage you to take your nausea medication    If you develop nausea and vomiting that is not controlled by your nausea medication, call the clinic.   BELOW ARE SYMPTOMS THAT SHOULD BE REPORTED IMMEDIATELY:  *FEVER GREATER THAN 100.5 F  *CHILLS WITH OR WITHOUT FEVER  NAUSEA AND VOMITING THAT IS NOT CONTROLLED WITH YOUR NAUSEA MEDICATION  *UNUSUAL SHORTNESS OF BREATH  *UNUSUAL BRUISING OR BLEEDING  TENDERNESS IN MOUTH AND THROAT WITH OR WITHOUT PRESENCE OF ULCERS  *URINARY PROBLEMS  *BOWEL PROBLEMS  UNUSUAL RASH Items with * indicate a potential emergency and should be followed up as soon as possible.  Feel free to call the clinic you have any questions or concerns. The clinic phone number is [phone].  Please show the [facility] at check-in to the Emergency Department and triage nurse.

## 2014-11-24 NOTE — Progress Notes (Signed)
Hematology and Oncology Follow Up Visit  Donald Fry [id number] Feb 01, 1956 60 y.o. 11/24/2014   Principle Diagnosis:  Metastatic colon cancer- KRAS wild type  Current Therapy:   Status post cycle 4 of FOLFOXIRI    Interim History:  Donald Fry is here today with his wife for follow-up . He continues looks great. His weight is now 176 pounds.  He continues to improve nicely. He's had no issues with abdominal pain. He has some occasional bouts of discomfort. He's had no nausea or vomiting. He's had no issues with diarrhea or constipation.  There's been no bleeding.  He's had no mouth sores. He's had no cough or shortness of breath. He's had no leg swelling.  Overall, his performance status is ECOG 1.   Medications:    Medication List       This list is accurate as of: 11/24/14  8:16 AM.  Always use your most recent med list.               ALPRAZolam 1 MG tablet  Commonly known as:  XANAX  Take 1 tablet (1 mg total) by mouth 4 (four) times daily as needed for anxiety.     aspirin 81 MG tablet  Take 81 mg by mouth daily.     atenolol 100 MG tablet  Commonly known as:  TENORMIN  Take 100 mg by mouth daily.     eucerin cream  Apply 1 application topically daily. Applied to hands and feet     lidocaine-prilocaine cream  Commonly known as:  EMLA  Apply to affected area once     lovastatin 40 MG tablet  Commonly known as:  MEVACOR  Take 40 mg by mouth at bedtime.     mirtazapine 15 MG tablet  Commonly known as:  REMERON  Take 0.5-1 tablets (7.5-15 mg total) by mouth at bedtime.     multivitamin-iron-minerals-folic acid chewable tablet  Chew 1 tablet by mouth daily.     ondansetron 8 MG tablet  Commonly known as:  ZOFRAN  Take 8 mg by mouth 2 (two) times daily. Take one tab two times a day, start the day after chemo for 3 days, then as needed for n&v.     predniSONE 20 MG tablet  Commonly known as:  DELTASONE  TAKE ONE TABLET BY MOUTH ONE TIME DAILY WITH  BREAKFAST     prochlorperazine 10 MG tablet  Commonly known as:  COMPAZINE  Take 1 tablet (10 mg total) by mouth every 6 (six) hours as needed (Nausea or vomiting).     senna 8.6 MG Tabs tablet  Commonly known as:  SENOKOT  Take 1 tablet (8.6 mg total) by mouth daily as needed for mild constipation.     temazepam 15 MG capsule  Commonly known as:  RESTORIL  TAKE 1 TABLET BY MOUTH ONCE NIGHTLY AT BEDTIME AS NEEDED FOR SLEEP     traMADol 50 MG tablet  Commonly known as:  ULTRAM  Take 1 tablet (50 mg total) by mouth every 6 (six) hours as needed for severe pain.        Allergies: No Known Allergies  Past Medical History, Surgical history, Social history, and Family History were reviewed and updated.  Review of Systems: All other 10 point review of systems is negative.   Physical Exam:  vitals were not taken for this visit.  Wt Readings from Last 3 Encounters:  11/10/14 174 lb (78.926 kg)  10/20/14 164 lb (74.39 kg)  10/08/14  160 lb (72.576 kg)    Well-developed and well-nourished white woman in no obvious distress. Head and neck exam shows no ocular or oral lesions. He has no mucositis. He has no scleral icterus. There is no adenopathy in the neck. Lungs are clear. Cardiac exam regular rate and rhythm with no murmurs, rubs or bruits. Abdomen is soft. He has good bowel sounds. There is no fluid wave. There is no palpable abdominal mass. Has well-healed laparotomy scar. He has no palpable liver or spleen tip. Extremities shows no clubbing, cyanosis or edema. Skin exam shows no rashes, ecchymoses or petechia.  Lab Results  Component Value Date   WBC 10.4* 11/10/2014   HGB 12.1* 11/10/2014   HCT 37.2* 11/10/2014   MCV 97 11/10/2014   PLT 202 11/10/2014   Lab Results  Component Value Date   FERRITIN 189 09/22/2014   IRON 124 09/22/2014   TIBC 236 09/22/2014   UIBC 112* 09/22/2014   IRONPCTSAT 52 09/22/2014   Lab Results  Component Value Date   RBC 3.85* 11/10/2014    No results found for: KPAFRELGTCHN, LAMBDASER, KAPLAMBRATIO No results found for: IGGSERUM, IGA, IGMSERUM No results found for: Odetta Pink, SPEI   Chemistry      Component Value Date/Time   NA 140 11/10/2014 0925   NA 136 09/01/2014 0400   K 4.4 11/10/2014 0925   K 3.6 09/01/2014 0400   CL 102 11/10/2014 0925   CL 105 09/01/2014 0400   CO2 29 11/10/2014 0925   CO2 24 09/01/2014 0400   BUN 14 11/10/2014 0925   BUN 9 09/01/2014 0400   CREATININE 0.7 11/10/2014 0925   CREATININE 0.54 09/01/2014 0400      Component Value Date/Time   CALCIUM 9.3 11/10/2014 0925   CALCIUM 8.4 09/01/2014 0400   ALKPHOS 67 11/10/2014 0925   ALKPHOS 60 09/01/2014 0400   AST 45* 11/10/2014 0925   AST 23 09/01/2014 0400   ALT 62* 11/10/2014 0925   ALT 13 09/01/2014 0400   BILITOT 0.60 11/10/2014 0925   BILITOT 1.1 09/01/2014 0400     Impression and Plan: Donald Fry is a 59 year old gentleman with metastatic colorectal cancer. He had extensive peritoneal disease. His weight gain tells me everything I need to know. He is responding to treatment quite nicely.  We will continue to treat him. We will give him his sixth cycle today.  We will plan for 8 cycles and then rescan him area and if everything looks okay, then we might want to consider him for some "maintenance" therapy with just 5-FU and add Avastin.  We will give him back in 2 more weeks.    Volanda Napoleon, MD 6/22/20168:16 AM

## 2014-11-26 ENCOUNTER — Ambulatory Visit (HOSPITAL_BASED_OUTPATIENT_CLINIC_OR_DEPARTMENT_OTHER): Payer: Medicaid Other

## 2014-11-26 ENCOUNTER — Other Ambulatory Visit: Payer: Self-pay | Admitting: Emergency Medicine

## 2014-11-26 VITALS — BP 127/73 | HR 65 | Temp 97.6°F | Resp 18

## 2014-11-26 DIAGNOSIS — C189 Malignant neoplasm of colon, unspecified: Secondary | ICD-10-CM | POA: Diagnosis not present

## 2014-11-26 MED ORDER — HEPARIN SOD (PORK) LOCK FLUSH 100 UNIT/ML IV SOLN
500.0000 [IU] | Freq: Once | INTRAVENOUS | Status: AC | PRN
  Administered 2014-11-26: 500 [IU]
  Filled 2014-11-26: qty 5

## 2014-11-26 MED ORDER — SODIUM CHLORIDE 0.9 % IJ SOLN
10.0000 mL | INTRAMUSCULAR | Status: DC | PRN
  Administered 2014-11-26: 10 mL
  Filled 2014-11-26: qty 10

## 2014-11-26 NOTE — Patient Instructions (Signed)
Fluorouracil, 5FU; Diclofenac topical cream What is this medicine? FLUOROURACIL; DICLOFENAC (flure oh YOOR a sil; dye KLOE fen ak) is a combination of a topical chemotherapy agent and non-steroidal anti-inflammatory drug (NSAID). It is used on the skin to treat skin cancer and skin conditions that could become cancer. This medicine may be used for other purposes; ask your health care provider or pharmacist if you have questions. COMMON BRAND NAME(S): FLUORAC What should I tell my health care provider before I take this medicine? They need to know if you have any of these conditions: -bleeding problems -cigarette smoker -DPD enzyme deficiency -heart disease -high blood pressure -if you frequently drink alcohol containing drinks -kidney disease -liver disease -open or infected skin -stomach problems -swelling or open sores at the treatment site -recent or planned coronary artery bypass graft (CABG) surgery -an unusual or allergic reaction to fluorouracil, diclofenac, aspirin, other NSAIDs, other medicines, foods, dyes, or preservatives -pregnant or trying to get pregnant -breast-feeding How should I use this medicine? This medicine is only for use on the skin. Follow the directions on the prescription label. Wash hands before and after use. Wash affected area and gently pat dry. To apply this medicine use a cotton-tipped applicator, or use gloves if applying with fingertips. If applied with unprotected fingertips, it is very important to wash your hands well after you apply this medicine. Avoid applying to the eyes, nose, or mouth. Apply enough medicine to cover the affected area. You can cover the area with a light gauze dressing, but do not use tight or air-tight dressings. Finish the full course prescribed by your doctor or health care professional, even if you think your condition is better. Do not stop taking except on the advice of your doctor or health care professional. Talk to your  pediatrician regarding the use of this medicine in children. Special care may be needed. Overdosage: If you think you've taken too much of this medicine contact a poison control center or emergency room at once. Overdosage: If you think you have taken too much of this medicine contact a poison control center or emergency room at once. NOTE: This medicine is only for you. Do not share this medicine with others. What if I miss a dose? If you miss a dose, apply it as soon as you can. If it is almost time for your next dose, only use that dose. Do not apply extra doses. Contact your doctor or health care professional if you miss more than one dose. What may interact with this medicine? Interactions are not expected. Do not use any other skin products without telling your doctor or health care professional. This list may not describe all possible interactions. Give your health care provider a list of all the medicines, herbs, non-prescription drugs, or dietary supplements you use. Also tell them if you smoke, drink alcohol, or use illegal drugs. Some items may interact with your medicine. What should I watch for while using this medicine? Visit your doctor or health care professional for checks on your progress. You will need to use this medicine for 2 to 6 weeks. This may be longer depending on the condition being treated. You may not see full healing for another 1 to 2 months after you stop using the medicine. Treated areas of skin can look unsightly during and for several weeks after treatment with this medicine. This medicine can make you more sensitive to the sun. Keep out of the sun. If you cannot avoid being in   the sun, wear protective clothing and use sunscreen. Do not use sun lamps or tanning beds/booths. Where should I keep my What side effects may I notice from receiving this medicine? Side effects that you should report to your doctor or health care professional as soon as possible: -allergic  reactions like skin rash, itching or hives, swelling of the face, lips, or tongue -black or bloody stools, blood in the urine or vomit -blurred vision -chest pain -difficulty breathing or wheezing -redness, blistering, peeling or loosening of the skin, including inside the mouth -severe redness and swelling of normal skin -slurred speech or weakness on one side of the body -trouble passing urine or change in the amount of urine -unexplained weight gain or swelling -unusually weak or tired -yellowing of eyes or skin Side effects that usually do not require medical attention (Report these to your doctor or health care professional if they continue or are bothersome.): -increased sensitivity of the skin to sun and ultraviolet light -pain and burning of the affected area -scaling or swelling of the affected area -skin rash, itching of the affected area -tenderness This list may not describe all possible side effects. Call your doctor for medical advice about side effects. You may report side effects to FDA at 1-800-FDA-1088. Where should I keep my medicine? Keep out of the reach of children. Store at room temperature between 20 and 25 degrees C (68 and 77 degrees F). Throw away any unused medicine after the expiration date. NOTE: This sheet is a summary. It may not cover all possible information. If you have questions about this medicine, talk to your doctor, pharmacist, or health care provider.  2015, [employer] Standard. (2013-09-21 11:09:58)  

## 2014-12-08 ENCOUNTER — Other Ambulatory Visit (HOSPITAL_BASED_OUTPATIENT_CLINIC_OR_DEPARTMENT_OTHER): Payer: Medicaid Other

## 2014-12-08 ENCOUNTER — Ambulatory Visit (HOSPITAL_BASED_OUTPATIENT_CLINIC_OR_DEPARTMENT_OTHER): Payer: Medicaid Other

## 2014-12-08 ENCOUNTER — Ambulatory Visit (HOSPITAL_BASED_OUTPATIENT_CLINIC_OR_DEPARTMENT_OTHER): Payer: Medicaid Other | Admitting: Family

## 2014-12-08 VITALS — BP 133/75 | HR 64 | Temp 97.5°F | Resp 20 | Wt 181.1 lb

## 2014-12-08 DIAGNOSIS — C786 Secondary malignant neoplasm of retroperitoneum and peritoneum: Secondary | ICD-10-CM

## 2014-12-08 DIAGNOSIS — C189 Malignant neoplasm of colon, unspecified: Secondary | ICD-10-CM

## 2014-12-08 DIAGNOSIS — C183 Malignant neoplasm of hepatic flexure: Secondary | ICD-10-CM

## 2014-12-08 DIAGNOSIS — Z5111 Encounter for antineoplastic chemotherapy: Secondary | ICD-10-CM | POA: Diagnosis not present

## 2014-12-08 LAB — CBC WITH DIFFERENTIAL (CANCER CENTER ONLY)
BASO#: 0.1 10*3/uL (ref 0.0–0.2)
BASO%: 1 % (ref 0.0–2.0)
EOS ABS: 0 10*3/uL (ref 0.0–0.5)
EOS%: 0.3 % (ref 0.0–7.0)
HCT: 36 % — ABNORMAL LOW (ref 38.7–49.9)
HGB: 12.1 g/dL — ABNORMAL LOW (ref 13.0–17.1)
LYMPH#: 0.7 10*3/uL — ABNORMAL LOW (ref 0.9–3.3)
LYMPH%: 10.3 % — AB (ref 14.0–48.0)
MCH: 34.4 pg — AB (ref 28.0–33.4)
MCHC: 33.6 g/dL (ref 32.0–35.9)
MCV: 102 fL — AB (ref 82–98)
MONO#: 0.5 10*3/uL (ref 0.1–0.9)
MONO%: 7.7 % (ref 0.0–13.0)
NEUT%: 80.7 % — ABNORMAL HIGH (ref 40.0–80.0)
NEUTROS ABS: 5.5 10*3/uL (ref 1.5–6.5)
Platelets: 148 10*3/uL (ref 145–400)
RBC: 3.52 10*6/uL — ABNORMAL LOW (ref 4.20–5.70)
RDW: 21.7 % — ABNORMAL HIGH (ref 11.1–15.7)
WBC: 6.9 10*3/uL (ref 4.0–10.0)

## 2014-12-08 LAB — CMP (CANCER CENTER ONLY)
ALT(SGPT): 49 U/L — ABNORMAL HIGH (ref 10–47)
AST: 27 U/L (ref 11–38)
Albumin: 3.3 g/dL (ref 3.3–5.5)
Alkaline Phosphatase: 71 U/L (ref 26–84)
BUN: 16 mg/dL (ref 7–22)
CALCIUM: 9.3 mg/dL (ref 8.0–10.3)
CO2: 29 meq/L (ref 18–33)
CREATININE: 0.6 mg/dL (ref 0.6–1.2)
Chloride: 103 mEq/L (ref 98–108)
Glucose, Bld: 150 mg/dL — ABNORMAL HIGH (ref 73–118)
Potassium: 4.3 mEq/L (ref 3.3–4.7)
Sodium: 140 mEq/L (ref 128–145)
Total Bilirubin: 0.8 mg/dl (ref 0.20–1.60)
Total Protein: 6.1 g/dL — ABNORMAL LOW (ref 6.4–8.1)

## 2014-12-08 MED ORDER — SODIUM CHLORIDE 0.9 % IV SOLN
Freq: Once | INTRAVENOUS | Status: AC
  Administered 2014-12-08: 12:00:00 via INTRAVENOUS
  Filled 2014-12-08: qty 8

## 2014-12-08 MED ORDER — DEXTROSE 5 % IV SOLN
68.0000 mg/m2 | Freq: Once | INTRAVENOUS | Status: AC
  Administered 2014-12-08: 120 mg via INTRAVENOUS
  Filled 2014-12-08: qty 20

## 2014-12-08 MED ORDER — ATROPINE SULFATE 1 MG/ML IJ SOLN
INTRAMUSCULAR | Status: AC
  Filled 2014-12-08: qty 1

## 2014-12-08 MED ORDER — PREDNISONE 10 MG PO TABS: ORAL_TABLET | ORAL | Status: AC

## 2014-12-08 MED ORDER — SODIUM CHLORIDE 0.9 % IV SOLN
2900.0000 mg/m2 | INTRAVENOUS | Status: DC
  Administered 2014-12-08: 5200 mg via INTRAVENOUS
  Filled 2014-12-08: qty 104

## 2014-12-08 MED ORDER — SODIUM CHLORIDE 0.9 % IJ SOLN
10.0000 mL | INTRAMUSCULAR | Status: DC | PRN
  Filled 2014-12-08: qty 10

## 2014-12-08 MED ORDER — HEPARIN SOD (PORK) LOCK FLUSH 100 UNIT/ML IV SOLN
500.0000 [IU] | Freq: Once | INTRAVENOUS | Status: DC | PRN
  Filled 2014-12-08: qty 5

## 2014-12-08 MED ORDER — LEUCOVORIN CALCIUM INJECTION 350 MG
350.0000 mg | Freq: Once | INTRAVENOUS | Status: AC
  Administered 2014-12-08: 350 mg via INTRAVENOUS
  Filled 2014-12-08: qty 17.5

## 2014-12-08 MED ORDER — ATROPINE SULFATE 1 MG/ML IJ SOLN
0.5000 mg | Freq: Once | INTRAMUSCULAR | Status: AC | PRN
  Administered 2014-12-08: 0.5 mg via INTRAVENOUS

## 2014-12-08 MED ORDER — DEXTROSE 5 % IV SOLN
Freq: Once | INTRAVENOUS | Status: AC
  Administered 2014-12-08: 12:00:00 via INTRAVENOUS

## 2014-12-08 MED ORDER — IRINOTECAN HCL CHEMO INJECTION 100 MG/5ML
260.0000 mg | Freq: Once | INTRAVENOUS | Status: AC
  Administered 2014-12-08: 260 mg via INTRAVENOUS
  Filled 2014-12-08: qty 10

## 2014-12-08 NOTE — Patient Instructions (Signed)
[facility] Discharge Instructions for Patients Receiving Chemotherapy  Today you received the following chemotherapy agents OXaliplatin, Leucovorin, and Camptosar  To help prevent nausea and vomiting after your treatment, we encourage you to take your nausea medication    If you develop nausea and vomiting that is not controlled by your nausea medication, call the clinic.   BELOW ARE SYMPTOMS THAT SHOULD BE REPORTED IMMEDIATELY:  *FEVER GREATER THAN 100.5 F  *CHILLS WITH OR WITHOUT FEVER  NAUSEA AND VOMITING THAT IS NOT CONTROLLED WITH YOUR NAUSEA MEDICATION  *UNUSUAL SHORTNESS OF BREATH  *UNUSUAL BRUISING OR BLEEDING  TENDERNESS IN MOUTH AND THROAT WITH OR WITHOUT PRESENCE OF ULCERS  *URINARY PROBLEMS  *BOWEL PROBLEMS  UNUSUAL RASH Items with * indicate a potential emergency and should be followed up as soon as possible.  Feel free to call the clinic you have any questions or concerns. The clinic phone number is [phone].  Please show the [facility] at check-in to the Emergency Department and triage nurse.

## 2014-12-08 NOTE — Progress Notes (Signed)
Hematology and Oncology Follow Up Visit  KIP CROPP [id number] 08-12-1955 59 y.o. 12/08/2014   Principle Diagnosis:  Metastatic colon cancer- KRAS wild type  Current Therapy:   Status post cycle 6 of FOLFOXIRI    Interim History:  Mr. Espindola is here today with his wife for follow-up and cycle 7 of treatment. 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 He states that it feels like his fingertips are "being shocked."  He has a good appetite but has become sensitive to cold and hot foods and drinks. He is eating and drinking everything at room temperature. He is concerned with his weight gain. We will decrease his prednisone dose by half.  He denies fever, chills, n/v, cough, rash, dizziness, chest pain, palpitations, abdominal pain, constipation, diarrhea, blood in urine or stool.  He has some SOB with any exertion. He tries to walk 1 mile a day in the morning or evening.  No lymphadenopathy found on assessment. No episodes of bleeding or bruising.  He has no swelling or tenderness in his extremities. No new aches or pains.  His CEA in June was 1.6.   Medications:    Medication List       This list is accurate as of: 12/08/14 10:04 AM.  Always use your most recent med list.               ALPRAZolam 1 MG tablet  Commonly known as:  XANAX  Take 1 tablet (1 mg total) by mouth 4 (four) times daily as needed for anxiety.     aspirin 81 MG tablet  Take 81 mg by mouth daily.     atenolol 100 MG tablet  Commonly known as:  TENORMIN  Take 100 mg by mouth daily.     eucerin cream  Apply 1 application topically daily. Applied to hands and feet     lidocaine-prilocaine cream  Commonly known as:  EMLA  Apply to affected area once     lovastatin 40 MG tablet  Commonly known as:  MEVACOR  Take 40 mg by mouth at bedtime.     mirtazapine 15 MG tablet  Commonly known as:  REMERON  Take 0.5-1 tablets (7.5-15 mg total) by mouth at bedtime.     multivitamin  tablet  Take 1 tablet by mouth daily.     ondansetron 8 MG tablet  Commonly known as:  ZOFRAN  Take 8 mg by mouth 2 (two) times daily. Take one tab two times a day, start the day after chemo for 3 days, then as needed for n&v.     predniSONE 20 MG tablet  Commonly known as:  DELTASONE  TAKE ONE TABLET BY MOUTH ONE TIME DAILY WITH BREAKFAST     prochlorperazine 10 MG tablet  Commonly known as:  COMPAZINE  Take 1 tablet (10 mg total) by mouth every 6 (six) hours as needed (Nausea or vomiting).     senna 8.6 MG Tabs tablet  Commonly known as:  SENOKOT  Take 1 tablet (8.6 mg total) by mouth daily as needed for mild constipation.     temazepam 15 MG capsule  Commonly known as:  RESTORIL  TAKE 1 TABLET BY MOUTH ONCE NIGHTLY AT BEDTIME AS NEEDED FOR SLEEP     traMADol 50 MG tablet  Commonly known as:  ULTRAM  Take 1 tablet (50 mg total) by mouth every 6 (six) hours as needed for severe pain.        Allergies:  No Known Allergies  Past Medical History, Surgical history, Social history, and Family History were reviewed and updated.  Review of Systems: All other 10 point review of systems is negative.   Physical Exam:  weight is 181 lb 1.9 oz (82.155 kg). His oral temperature is 97.5 F (36.4 C). His blood pressure is 133/75 and his pulse is 64. His respiration is 20.   Wt Readings from Last 3 Encounters:  12/08/14 181 lb 1.9 oz (82.155 kg)  11/24/14 176 lb (79.833 kg)  11/10/14 174 lb (78.926 kg)    Ocular: Sclerae unicteric, pupils equal, round and reactive to light Ear-nose-throat: Oropharynx clear, dentition fair Lymphatic: No cervical or supraclavicular adenopathy Lungs no rales or rhonchi, good excursion bilaterally Heart regular rate and rhythm, no murmur appreciated Abd soft, nontender, positive bowel sounds MSK no focal spinal tenderness, no joint edema Neuro: non-focal, well-oriented, appropriate affect  Lab Results  Component Value Date   WBC 7.7 11/24/2014     HGB 12.4* 11/24/2014   HCT 37.0* 11/24/2014   MCV 99* 11/24/2014   PLT 205 11/24/2014   Lab Results  Component Value Date   FERRITIN 189 09/22/2014   IRON 124 09/22/2014   TIBC 236 09/22/2014   UIBC 112* 09/22/2014   IRONPCTSAT 52 09/22/2014   Lab Results  Component Value Date   RBC 3.73* 11/24/2014   No results found for: KPAFRELGTCHN, LAMBDASER, KAPLAMBRATIO No results found for: IGGSERUM, IGA, IGMSERUM No results found for: Odetta Pink, SPEI   Chemistry      Component Value Date/Time   NA 138 11/24/2014 0825   NA 136 09/01/2014 0400   K 3.9 11/24/2014 0825   K 3.6 09/01/2014 0400   CL 105 11/24/2014 0825   CL 105 09/01/2014 0400   CO2 29 11/24/2014 0825   CO2 24 09/01/2014 0400   BUN 17 11/24/2014 0825   BUN 9 09/01/2014 0400   CREATININE 0.6 11/24/2014 0825   CREATININE 0.54 09/01/2014 0400      Component Value Date/Time   CALCIUM 9.4 11/24/2014 0825   CALCIUM 8.4 09/01/2014 0400   ALKPHOS 65 11/24/2014 0825   ALKPHOS 60 09/01/2014 0400   AST 26 11/24/2014 0825   AST 23 09/01/2014 0400   ALT 62* 11/24/2014 0825   ALT 13 09/01/2014 0400   BILITOT 0.80 11/24/2014 0825   BILITOT 1.1 09/01/2014 0400     Impression and Plan: Mr. Sugg is a 59 year old gentleman with metastatic colorectal cancer with extensive peritoneal disease. 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 He is also concerned that he has gained too much weight.  We will decrease his prednisone to 10 mg daily.  Dr. Marin Olp will also adjust his Oxaliplatin. This will decrease by 20%. Hopefully this will help with his neuropathy.  We will repeat scans on him in 2 weeks and then plan to see him in 3 weeks for labs and follow-up.  Both he and his wife know to call us with any questions or concerns. We can certainly see him sooner if need be.   [name], NP 7/6/201610:04 AM

## 2014-12-09 LAB — CEA: CEA: 2.4 ng/mL (ref 0.0–5.0)

## 2014-12-10 ENCOUNTER — Ambulatory Visit (HOSPITAL_BASED_OUTPATIENT_CLINIC_OR_DEPARTMENT_OTHER): Payer: Medicaid Other

## 2014-12-10 VITALS — BP 124/70 | HR 68 | Temp 98.0°F | Resp 16 | Ht 68.0 in | Wt 182.0 lb

## 2014-12-10 DIAGNOSIS — C189 Malignant neoplasm of colon, unspecified: Secondary | ICD-10-CM | POA: Diagnosis not present

## 2014-12-10 DIAGNOSIS — Z452 Encounter for adjustment and management of vascular access device: Secondary | ICD-10-CM | POA: Diagnosis not present

## 2014-12-10 MED ORDER — HEPARIN SOD (PORK) LOCK FLUSH 100 UNIT/ML IV SOLN
500.0000 [IU] | Freq: Once | INTRAVENOUS | Status: AC | PRN
  Administered 2014-12-10: 500 [IU]
  Filled 2014-12-10: qty 5

## 2014-12-10 MED ORDER — SODIUM CHLORIDE 0.9 % IJ SOLN
10.0000 mL | INTRAMUSCULAR | Status: DC | PRN
  Administered 2014-12-10: 10 mL
  Filled 2014-12-10: qty 10

## 2014-12-10 NOTE — Patient Instructions (Signed)
[facility] Discharge Instructions for Patients Receiving Chemotherapy    To help prevent nausea and vomiting after your treatment, we encourage you to take your nausea medication as ordered   If you develop nausea and vomiting that is not controlled by your nausea medication, call the clinic.   BELOW ARE SYMPTOMS THAT SHOULD BE REPORTED IMMEDIATELY:  *FEVER GREATER THAN 100.5 F  *CHILLS WITH OR WITHOUT FEVER  NAUSEA AND VOMITING THAT IS NOT CONTROLLED WITH YOUR NAUSEA MEDICATION  *UNUSUAL SHORTNESS OF BREATH  *UNUSUAL BRUISING OR BLEEDING  TENDERNESS IN MOUTH AND THROAT WITH OR WITHOUT PRESENCE OF ULCERS  *URINARY PROBLEMS  *BOWEL PROBLEMS  UNUSUAL RASH Items with * indicate a potential emergency and should be followed up as soon as possible.  Feel free to call the clinic you have any questions or concerns. The clinic phone number is [phone].  Please show the [facility] at check-in to the Emergency Department and triage nurse.  Implanted [facility] Guide An implanted port is a type of central line that is placed under the skin. Central lines are used to provide IV access when treatment or nutrition needs to be given through a person's veins. Implanted ports are used for long-term IV access. An implanted port may be placed because:  9. You need IV medicine that would be irritating to the small veins in your hands or arms.  10. You need long-term IV medicines, such as antibiotics.  11. You need IV nutrition for a long period.  12. You need frequent blood draws for lab tests.  13. You need dialysis.  Implanted ports are usually placed in the chest area, but they can also be placed in the upper arm, the abdomen, or the leg. An implanted port has two main parts:  2. Reservoir. The reservoir is round and will appear as a small, raised area under your skin. The reservoir is the part where a needle is inserted to give medicines or draw blood.   3. Catheter. The catheter is a thin, flexible tube that extends from the reservoir. The catheter is placed into a large vein. Medicine that is inserted into the reservoir goes into the catheter and then into the vein.  HOW WILL I CARE FOR MY INCISION SITE? Do not get the incision site wet. Bathe or shower as directed by your health care provider.  HOW IS MY PORT ACCESSED? Special steps must be taken to access the port:   Before the port is accessed, a numbing cream can be placed on the skin. This helps numb the skin over the port site.   Your health care provider uses a sterile technique to access the port.  Your health care provider must put on a mask and sterile gloves.  The skin over your port is cleaned carefully with an antiseptic and allowed to dry.  The port is gently pinched between sterile gloves, and a needle is inserted into the port.  Only "non-coring" port needles should be used to access the port. Once the port is accessed, a blood return should be checked. This helps ensure that the port is in the vein and is not clogged.   If your port needs to remain accessed for a constant infusion, a clear (transparent) bandage will be placed over the needle site. The bandage and needle will need to be changed every week, or as directed by your health care provider.   Keep the bandage covering the needle clean and dry.  Do not get it wet. Follow your health care provider's instructions on how to take a shower or bath while the port is accessed.   If your port does not need to stay accessed, no bandage is needed over the port.  WHAT IS FLUSHING? Flushing helps keep the port from getting clogged. Follow your health care provider's instructions on how and when to flush the port. Ports are usually flushed with saline solution or a medicine called heparin. The need for flushing will depend on how the port is used.   If the port is used for intermittent medicines or blood draws, the  port will need to be flushed:   After medicines have been given.   After blood has been drawn.   As part of routine maintenance.   If a constant infusion is running, the port may not need to be flushed.  HOW LONG WILL MY PORT STAY IMPLANTED? The port can stay in for as long as your health care provider thinks it is needed. When it is time for the port to come out, surgery will be done to remove it. The procedure is similar to the one performed when the port was put in.  WHEN SHOULD I SEEK IMMEDIATE MEDICAL CARE? When you have an implanted port, you should seek immediate medical care if:   You notice a bad smell coming from the incision site.   You have swelling, redness, or drainage at the incision site.   You have more swelling or pain at the port site or the surrounding area.   You have a fever that is not controlled with medicine. Document Released: 05/21/2005 Document Revised: 03/11/2013 Document Reviewed: 01/26/2013 [facility] Patient Information 2015 [place], [place]. This information is not intended to replace advice given to you by your health care provider. Make sure you discuss any questions you have with your health care provider.

## 2014-12-16 ENCOUNTER — Telehealth: Payer: Self-pay | Admitting: Hematology & Oncology

## 2014-12-16 NOTE — Telephone Encounter (Signed)
Contacted pt regarding appt time change on 7/27

## 2014-12-17 ENCOUNTER — Other Ambulatory Visit: Payer: Self-pay | Admitting: Internal Medicine

## 2014-12-21 ENCOUNTER — Other Ambulatory Visit: Payer: Self-pay | Admitting: *Deleted

## 2014-12-21 DIAGNOSIS — C189 Malignant neoplasm of colon, unspecified: Secondary | ICD-10-CM

## 2014-12-21 MED ORDER — MIRTAZAPINE 15 MG PO TABS: 7.5000 mg | ORAL_TABLET | Freq: Two times a day (BID) | ORAL | Status: AC

## 2014-12-22 ENCOUNTER — Encounter (HOSPITAL_BASED_OUTPATIENT_CLINIC_OR_DEPARTMENT_OTHER): Payer: Self-pay

## 2014-12-22 ENCOUNTER — Other Ambulatory Visit: Payer: Self-pay | Admitting: Emergency Medicine

## 2014-12-22 ENCOUNTER — Ambulatory Visit (HOSPITAL_BASED_OUTPATIENT_CLINIC_OR_DEPARTMENT_OTHER)
Admission: RE | Admit: 2014-12-22 | Discharge: 2014-12-22 | Disposition: A | Discharge status: Home / Self Care | Payer: Medicaid Other | Source: Ambulatory Visit | Attending: Family | Admitting: Family

## 2014-12-22 ENCOUNTER — Ambulatory Visit (HOSPITAL_BASED_OUTPATIENT_CLINIC_OR_DEPARTMENT_OTHER): Payer: Medicaid Other

## 2014-12-22 DIAGNOSIS — C189 Malignant neoplasm of colon, unspecified: Secondary | ICD-10-CM

## 2014-12-22 DIAGNOSIS — Z9049 Acquired absence of other specified parts of digestive tract: Secondary | ICD-10-CM | POA: Insufficient documentation

## 2014-12-22 DIAGNOSIS — K760 Fatty (change of) liver, not elsewhere classified: Secondary | ICD-10-CM | POA: Insufficient documentation

## 2014-12-22 DIAGNOSIS — I700 Atherosclerosis of aorta: Secondary | ICD-10-CM | POA: Diagnosis not present

## 2014-12-22 DIAGNOSIS — C786 Secondary malignant neoplasm of retroperitoneum and peritoneum: Secondary | ICD-10-CM | POA: Diagnosis not present

## 2014-12-22 DIAGNOSIS — C183 Malignant neoplasm of hepatic flexure: Secondary | ICD-10-CM

## 2014-12-22 DIAGNOSIS — Z452 Encounter for adjustment and management of vascular access device: Secondary | ICD-10-CM

## 2014-12-22 DIAGNOSIS — R911 Solitary pulmonary nodule: Secondary | ICD-10-CM | POA: Diagnosis not present

## 2014-12-22 DIAGNOSIS — K769 Liver disease, unspecified: Secondary | ICD-10-CM | POA: Insufficient documentation

## 2014-12-22 MED ORDER — ALPRAZOLAM 1 MG PO TABS: 1.0000 mg | ORAL_TABLET | Freq: Four times a day (QID) | ORAL | Status: DC | PRN

## 2014-12-22 MED ORDER — SODIUM CHLORIDE 0.9 % IJ SOLN
10.0000 mL | INTRAMUSCULAR | Status: DC | PRN
  Administered 2014-12-22: 10 mL via INTRAVENOUS
  Filled 2014-12-22: qty 10

## 2014-12-22 MED ORDER — IOHEXOL 300 MG/ML  SOLN
100.0000 mL | Freq: Once | INTRAMUSCULAR | Status: AC | PRN
  Administered 2014-12-22: 100 mL via INTRAVENOUS

## 2014-12-22 MED ORDER — HEPARIN SOD (PORK) LOCK FLUSH 100 UNIT/ML IV SOLN
500.0000 [IU] | Freq: Once | INTRAVENOUS | Status: AC
  Administered 2014-12-22: 500 [IU] via INTRAVENOUS
  Filled 2014-12-22: qty 5

## 2014-12-22 NOTE — Patient Instructions (Signed)

## 2014-12-23 ENCOUNTER — Telehealth: Payer: Self-pay | Admitting: Family

## 2014-12-23 NOTE — Telephone Encounter (Signed)
I spoke with Mr. Concannon this morning and went over his chest CT scan results with him. He has an appointment next Wednesday with dr. Marin Olp to discuss his treatment plan. All questions were answered and we will plan to see him next week.

## 2014-12-29 ENCOUNTER — Other Ambulatory Visit (HOSPITAL_BASED_OUTPATIENT_CLINIC_OR_DEPARTMENT_OTHER): Payer: Medicaid Other

## 2014-12-29 ENCOUNTER — Other Ambulatory Visit: Payer: Self-pay | Admitting: *Deleted

## 2014-12-29 ENCOUNTER — Ambulatory Visit (HOSPITAL_BASED_OUTPATIENT_CLINIC_OR_DEPARTMENT_OTHER): Payer: Medicaid Other | Admitting: Hematology & Oncology

## 2014-12-29 ENCOUNTER — Other Ambulatory Visit: Payer: Self-pay | Admitting: Oncology

## 2014-12-29 ENCOUNTER — Ambulatory Visit (HOSPITAL_BASED_OUTPATIENT_CLINIC_OR_DEPARTMENT_OTHER): Payer: Medicaid Other

## 2014-12-29 VITALS — BP 130/74 | HR 66 | Temp 98.4°F | Resp 20 | Wt 186.0 lb

## 2014-12-29 DIAGNOSIS — C183 Malignant neoplasm of hepatic flexure: Secondary | ICD-10-CM

## 2014-12-29 DIAGNOSIS — C189 Malignant neoplasm of colon, unspecified: Secondary | ICD-10-CM

## 2014-12-29 DIAGNOSIS — Z5111 Encounter for antineoplastic chemotherapy: Secondary | ICD-10-CM

## 2014-12-29 DIAGNOSIS — C786 Secondary malignant neoplasm of retroperitoneum and peritoneum: Secondary | ICD-10-CM

## 2014-12-29 LAB — CBC WITH DIFFERENTIAL (CANCER CENTER ONLY)
BASO#: 0.1 10*3/uL (ref 0.0–0.2)
BASO%: 0.8 % (ref 0.0–2.0)
EOS%: 0.8 % (ref 0.0–7.0)
Eosinophils Absolute: 0.1 10*3/uL (ref 0.0–0.5)
HEMATOCRIT: 36.3 % — AB (ref 38.7–49.9)
HEMOGLOBIN: 12.8 g/dL — AB (ref 13.0–17.1)
LYMPH#: 1.1 10*3/uL (ref 0.9–3.3)
LYMPH%: 12.6 % — AB (ref 14.0–48.0)
MCH: 37.3 pg — AB (ref 28.0–33.4)
MCHC: 35.3 g/dL (ref 32.0–35.9)
MCV: 106 fL — ABNORMAL HIGH (ref 82–98)
MONO#: 1.4 10*3/uL — ABNORMAL HIGH (ref 0.1–0.9)
MONO%: 16.1 % — ABNORMAL HIGH (ref 0.0–13.0)
NEUT%: 69.7 % (ref 40.0–80.0)
NEUTROS ABS: 5.9 10*3/uL (ref 1.5–6.5)
PLATELETS: 207 10*3/uL (ref 145–400)
RBC: 3.43 10*6/uL — ABNORMAL LOW (ref 4.20–5.70)
RDW: 17.2 % — AB (ref 11.1–15.7)
WBC: 8.5 10*3/uL (ref 4.0–10.0)

## 2014-12-29 LAB — CMP (CANCER CENTER ONLY)
ALT(SGPT): 65 U/L — ABNORMAL HIGH (ref 10–47)
AST: 48 U/L — ABNORMAL HIGH (ref 11–38)
Albumin: 3.5 g/dL (ref 3.3–5.5)
Alkaline Phosphatase: 79 U/L (ref 26–84)
BILIRUBIN TOTAL: 0.7 mg/dL (ref 0.20–1.60)
BUN: 14 mg/dL (ref 7–22)
CALCIUM: 9.5 mg/dL (ref 8.0–10.3)
CO2: 28 mEq/L (ref 18–33)
Chloride: 105 mEq/L (ref 98–108)
Creat: 0.8 mg/dl (ref 0.6–1.2)
GLUCOSE: 109 mg/dL (ref 73–118)
Potassium: 4.4 mEq/L (ref 3.3–4.7)
SODIUM: 146 meq/L — AB (ref 128–145)
Total Protein: 6.7 g/dL (ref 6.4–8.1)

## 2014-12-29 MED ORDER — ATROPINE SULFATE 1 MG/ML IJ SOLN
INTRAMUSCULAR | Status: AC
  Filled 2014-12-29: qty 1

## 2014-12-29 MED ORDER — ONDANSETRON HCL 8 MG PO TABS: 8.0000 mg | ORAL_TABLET | Freq: Two times a day (BID) | ORAL | Status: DC

## 2014-12-29 MED ORDER — ONDANSETRON HCL 8 MG PO TABS: 8.0000 mg | ORAL_TABLET | Freq: Two times a day (BID) | ORAL | Status: AC

## 2014-12-29 MED ORDER — IRINOTECAN HCL CHEMO INJECTION 100 MG/5ML
145.0000 mg/m2 | Freq: Once | INTRAVENOUS | Status: AC
  Administered 2014-12-29: 260 mg via INTRAVENOUS
  Filled 2014-12-29: qty 13

## 2014-12-29 MED ORDER — HEPARIN SOD (PORK) LOCK FLUSH 100 UNIT/ML IV SOLN
500.0000 [IU] | Freq: Once | INTRAVENOUS | Status: DC | PRN
  Filled 2014-12-29: qty 5

## 2014-12-29 MED ORDER — SODIUM CHLORIDE 0.9 % IV SOLN
2900.0000 mg/m2 | INTRAVENOUS | Status: DC
  Administered 2014-12-29: 5200 mg via INTRAVENOUS
  Filled 2014-12-29: qty 104

## 2014-12-29 MED ORDER — DEXTROSE 5 % IV SOLN
Freq: Once | INTRAVENOUS | Status: AC
  Administered 2014-12-29: 11:00:00 via INTRAVENOUS

## 2014-12-29 MED ORDER — ATROPINE SULFATE 1 MG/ML IJ SOLN
0.5000 mg | Freq: Once | INTRAMUSCULAR | Status: AC | PRN
  Administered 2014-12-29: 0.5 mg via INTRAVENOUS

## 2014-12-29 MED ORDER — SODIUM CHLORIDE 0.9 % IJ SOLN
10.0000 mL | INTRAMUSCULAR | Status: DC | PRN
  Filled 2014-12-29: qty 10

## 2014-12-29 MED ORDER — SODIUM CHLORIDE 0.9 % IV SOLN
Freq: Once | INTRAVENOUS | Status: AC
  Administered 2014-12-29: 11:00:00 via INTRAVENOUS
  Filled 2014-12-29: qty 8

## 2014-12-29 MED ORDER — LEUCOVORIN CALCIUM INJECTION 350 MG
195.0000 mg/m2 | Freq: Once | INTRAVENOUS | Status: AC
  Administered 2014-12-29: 350 mg via INTRAVENOUS
  Filled 2014-12-29: qty 17.5

## 2014-12-29 NOTE — Progress Notes (Signed)
Hematology and Oncology Follow Up Visit  Donald Fry [id number] Oct 23, 1955 59 y.o. 12/29/2014   Principle Diagnosis:  Metastatic colon cancer- KRAS wild type  Current Therapy:   Status post cycle 7 of FOLFOXIRI    Interim History:  Donald Fry is here today with his wife for follow-up . He continues looks great. His weight is now 186 pounds.  We went ahead and did a repeat CT scan on him. The CT scan was done on July 20. This did not show any obvious metastatic disease. There is no obvious growth. There was no abdominal fluid. His liver looked okay. There is no thoracic disease.  He continues to improve nicely. He's had no issues with abdominal pain. He has some occasional bouts of discomfort. He's had no nausea or vomiting. He's had no issues with diarrhea or constipation.  He's having more problems with neuropathy. As such, I'm going to drop the oxaliplatin. I think this is reasonable given the recent CT scan report.  There's been no bleeding.  He's had no mouth sores. He's had no cough or shortness of breath. He's had no leg swelling.  Overall, his performance status is ECOG 1.   Medications:    Medication List       This list is accurate as of: 12/29/14  4:40 PM.  Always use your most recent med list.               ALPRAZolam 1 MG tablet  Commonly known as:  XANAX  Take 1 tablet (1 mg total) by mouth 4 (four) times daily as needed for anxiety.     aspirin 81 MG tablet  Take 81 mg by mouth daily.     atenolol 100 MG tablet  Commonly known as:  TENORMIN  Take 100 mg by mouth daily.     eucerin cream  Apply 1 application topically daily. Applied to hands and feet     lidocaine-prilocaine cream  Commonly known as:  EMLA  Apply to affected area once     lovastatin 40 MG tablet  Commonly known as:  MEVACOR  Take 40 mg by mouth at bedtime.     mirtazapine 15 MG tablet  Commonly known as:  REMERON  Take 0.5 tablets (7.5 mg total) by mouth 2 (two) times daily.       multivitamin tablet  Take 1 tablet by mouth daily.     ondansetron 8 MG tablet  Commonly known as:  ZOFRAN  Take 1 tablet (8 mg total) by mouth 2 (two) times daily. Start the day after chemo for 3 days, then as needed for n&v.     predniSONE 10 MG tablet  Commonly known as:  DELTASONE  TAKE ONE TABLET BY MOUTH ONE TIME DAILY WITH BREAKFAST     prochlorperazine 10 MG tablet  Commonly known as:  COMPAZINE  Take 1 tablet (10 mg total) by mouth every 6 (six) hours as needed (Nausea or vomiting).     senna 8.6 MG Tabs tablet  Commonly known as:  SENOKOT  Take 1 tablet (8.6 mg total) by mouth daily as needed for mild constipation.     temazepam 15 MG capsule  Commonly known as:  RESTORIL  TAKE 1 TABLET BY MOUTH ONCE NIGHTLY AT BEDTIME AS NEEDED FOR SLEEP     traMADol 50 MG tablet  Commonly known as:  ULTRAM  Take 1 tablet (50 mg total) by mouth every 6 (six) hours as needed for severe pain.  Allergies: No Known Allergies  Past Medical History, Surgical history, Social history, and Family History were reviewed and updated.  Review of Systems: All other 10 point review of systems is negative.   Physical Exam:  weight is 186 lb (84.369 kg). His oral temperature is 98.4 F (36.9 C). His blood pressure is 130/74 and his pulse is 66. His respiration is 20.   Wt Readings from Last 3 Encounters:  12/29/14 186 lb (84.369 kg)  12/10/14 182 lb (82.555 kg)  12/08/14 181 lb 1.9 oz (82.155 kg)    Well-developed and well-nourished white woman in no obvious distress. Head and neck exam shows no ocular or oral lesions. He has no mucositis. He has no scleral icterus. There is no adenopathy in the neck. Lungs are clear. Cardiac exam regular rate and rhythm with no murmurs, rubs or bruits. Abdomen is soft. He has good bowel sounds. There is no fluid wave. There is no palpable abdominal mass. Has well-healed laparotomy scar. He has no palpable liver or spleen tip. Extremities shows no  clubbing, cyanosis or edema. Skin exam shows no rashes, ecchymoses or petechia.  Lab Results  Component Value Date   WBC 8.5 12/29/2014   HGB 12.8* 12/29/2014   HCT 36.3* 12/29/2014   MCV 106* 12/29/2014   PLT 207 12/29/2014   Lab Results  Component Value Date   FERRITIN 189 09/22/2014   IRON 124 09/22/2014   TIBC 236 09/22/2014   UIBC 112* 09/22/2014   IRONPCTSAT 52 09/22/2014   Lab Results  Component Value Date   RBC 3.43* 12/29/2014   No results found for: KPAFRELGTCHN, LAMBDASER, KAPLAMBRATIO No results found for: IGGSERUM, IGA, IGMSERUM No results found for: Odetta Pink, SPEI   Chemistry      Component Value Date/Time   NA 146* 12/29/2014 0909   NA 136 09/01/2014 0400   K 4.4 12/29/2014 0909   K 3.6 09/01/2014 0400   CL 105 12/29/2014 0909   CL 105 09/01/2014 0400   CO2 28 12/29/2014 0909   CO2 24 09/01/2014 0400   BUN 14 12/29/2014 0909   BUN 9 09/01/2014 0400   CREATININE 0.8 12/29/2014 0909   CREATININE 0.54 09/01/2014 0400      Component Value Date/Time   CALCIUM 9.5 12/29/2014 0909   CALCIUM 8.4 09/01/2014 0400   ALKPHOS 79 12/29/2014 0909   ALKPHOS 60 09/01/2014 0400   AST 48* 12/29/2014 0909   AST 23 09/01/2014 0400   ALT 65* 12/29/2014 0909   ALT 13 09/01/2014 0400   BILITOT 0.70 12/29/2014 0909   BILITOT 1.1 09/01/2014 0400     Impression and Plan: Donald Fry is a 59 year old gentleman with metastatic colorectal cancer. He had extensive peritoneal disease. His weight gain tells me everything I need to know. He is responding to treatment quite nicely.  We will continue to treat him. However, I will drop the oxaliplatin now. I does think that he is having toxicity that is affecting his quality of life. Since we are not going to cure him, I want to make sure that his functional status is as good as possible.  His recent CT scan is definitely reassuring.  We will go ahead and get him back in 2  more weeks. I'll plan for 4 more treatments and then we may drop the irinotecan.  I spent about 25-30 minutes with he and his wife going over the labs and the x-rays and outlining my decision to drop  the oxaliplatin.  Volanda Napoleon, MD 7/27/20164:40 PM

## 2014-12-29 NOTE — Patient Instructions (Signed)
[facility] Discharge Instructions for Patients Receiving Chemotherapy  Today you received the following chemotherapy agents  Irinotecan  To help prevent nausea and vomiting after your treatment, we encourage you to take your nausea medication    If you develop nausea and vomiting that is not controlled by your nausea medication, call the clinic.   BELOW ARE SYMPTOMS THAT SHOULD BE REPORTED IMMEDIATELY:  *FEVER GREATER THAN 100.5 F  *CHILLS WITH OR WITHOUT FEVER  NAUSEA AND VOMITING THAT IS NOT CONTROLLED WITH YOUR NAUSEA MEDICATION  *UNUSUAL SHORTNESS OF BREATH  *UNUSUAL BRUISING OR BLEEDING  TENDERNESS IN MOUTH AND THROAT WITH OR WITHOUT PRESENCE OF ULCERS  *URINARY PROBLEMS  *BOWEL PROBLEMS  UNUSUAL RASH Items with * indicate a potential emergency and should be followed up as soon as possible.  Feel free to call the clinic you have any questions or concerns. The clinic phone number is [phone].  Please show the [place] at check-in to the Emergency Department and triage nurse.

## 2014-12-30 LAB — CEA: CEA: 1.4 ng/mL (ref 0.0–5.0)

## 2014-12-31 ENCOUNTER — Ambulatory Visit (HOSPITAL_BASED_OUTPATIENT_CLINIC_OR_DEPARTMENT_OTHER): Payer: Medicaid Other

## 2014-12-31 VITALS — BP 131/79 | HR 65 | Temp 97.8°F | Resp 18

## 2014-12-31 DIAGNOSIS — C183 Malignant neoplasm of hepatic flexure: Secondary | ICD-10-CM | POA: Diagnosis present

## 2014-12-31 DIAGNOSIS — C786 Secondary malignant neoplasm of retroperitoneum and peritoneum: Secondary | ICD-10-CM | POA: Diagnosis not present

## 2014-12-31 DIAGNOSIS — C189 Malignant neoplasm of colon, unspecified: Secondary | ICD-10-CM

## 2014-12-31 MED ORDER — SODIUM CHLORIDE 0.9 % IJ SOLN
10.0000 mL | INTRAMUSCULAR | Status: DC | PRN
  Administered 2014-12-31: 10 mL
  Filled 2014-12-31: qty 10

## 2014-12-31 MED ORDER — HEPARIN SOD (PORK) LOCK FLUSH 100 UNIT/ML IV SOLN
500.0000 [IU] | Freq: Once | INTRAVENOUS | Status: AC | PRN
  Administered 2014-12-31: 500 [IU]
  Filled 2014-12-31: qty 5

## 2014-12-31 NOTE — Patient Instructions (Signed)
Fluorouracil, 5FU; Diclofenac topical cream What is this medicine? FLUOROURACIL; DICLOFENAC (flure oh YOOR a sil; dye KLOE fen ak) is a combination of a topical chemotherapy agent and non-steroidal anti-inflammatory drug (NSAID). It is used on the skin to treat skin cancer and skin conditions that could become cancer. This medicine may be used for other purposes; ask your health care provider or pharmacist if you have questions. COMMON BRAND NAME(S): FLUORAC What should I tell my health care provider before I take this medicine? They need to know if you have any of these conditions: -bleeding problems -cigarette smoker -DPD enzyme deficiency -heart disease -high blood pressure -if you frequently drink alcohol containing drinks -kidney disease -liver disease -open or infected skin -stomach problems -swelling or open sores at the treatment site -recent or planned coronary artery bypass graft (CABG) surgery -an unusual or allergic reaction to fluorouracil, diclofenac, aspirin, other NSAIDs, other medicines, foods, dyes, or preservatives -pregnant or trying to get pregnant -breast-feeding How should I use this medicine? This medicine is only for use on the skin. Follow the directions on the prescription label. Wash hands before and after use. Wash affected area and gently pat dry. To apply this medicine use a cotton-tipped applicator, or use gloves if applying with fingertips. If applied with unprotected fingertips, it is very important to wash your hands well after you apply this medicine. Avoid applying to the eyes, nose, or mouth. Apply enough medicine to cover the affected area. You can cover the area with a light gauze dressing, but do not use tight or air-tight dressings. Finish the full course prescribed by your doctor or health care professional, even if you think your condition is better. Do not stop taking except on the advice of your doctor or health care professional. Talk to your  pediatrician regarding the use of this medicine in children. Special care may be needed. Overdosage: If you think you've taken too much of this medicine contact a poison control center or emergency room at once. Overdosage: If you think you have taken too much of this medicine contact a poison control center or emergency room at once. NOTE: This medicine is only for you. Do not share this medicine with others. What if I miss a dose? If you miss a dose, apply it as soon as you can. If it is almost time for your next dose, only use that dose. Do not apply extra doses. Contact your doctor or health care professional if you miss more than one dose. What may interact with this medicine? Interactions are not expected. Do not use any other skin products without telling your doctor or health care professional. This list may not describe all possible interactions. Give your health care provider a list of all the medicines, herbs, non-prescription drugs, or dietary supplements you use. Also tell them if you smoke, drink alcohol, or use illegal drugs. Some items may interact with your medicine. What should I watch for while using this medicine? Visit your doctor or health care professional for checks on your progress. You will need to use this medicine for 2 to 6 weeks. This may be longer depending on the condition being treated. You may not see full healing for another 1 to 2 months after you stop using the medicine. Treated areas of skin can look unsightly during and for several weeks after treatment with this medicine. This medicine can make you more sensitive to the sun. Keep out of the sun. If you cannot avoid being in   the sun, wear protective clothing and use sunscreen. Do not use sun lamps or tanning beds/booths. Where should I keep my What side effects may I notice from receiving this medicine? Side effects that you should report to your doctor or health care professional as soon as possible: -allergic  reactions like skin rash, itching or hives, swelling of the face, lips, or tongue -black or bloody stools, blood in the urine or vomit -blurred vision -chest pain -difficulty breathing or wheezing -redness, blistering, peeling or loosening of the skin, including inside the mouth -severe redness and swelling of normal skin -slurred speech or weakness on one side of the body -trouble passing urine or change in the amount of urine -unexplained weight gain or swelling -unusually weak or tired -yellowing of eyes or skin Side effects that usually do not require medical attention (Report these to your doctor or health care professional if they continue or are bothersome.): -increased sensitivity of the skin to sun and ultraviolet light -pain and burning of the affected area -scaling or swelling of the affected area -skin rash, itching of the affected area -tenderness This list may not describe all possible side effects. Call your doctor for medical advice about side effects. You may report side effects to FDA at 1-800-FDA-1088. Where should I keep my medicine? Keep out of the reach of children. Store at room temperature between 20 and 25 degrees C (68 and 77 degrees F). Throw away any unused medicine after the expiration date. NOTE: This sheet is a summary. It may not cover all possible information. If you have questions about this medicine, talk to your doctor, pharmacist, or health care provider.  2015, [employer] Standard. (2013-09-21 11:09:58)  

## 2015-01-04 ENCOUNTER — Ambulatory Visit: Payer: Medicaid Other

## 2015-01-04 ENCOUNTER — Ambulatory Visit: Payer: Medicaid Other | Admitting: Hematology & Oncology

## 2015-01-04 ENCOUNTER — Other Ambulatory Visit: Payer: Medicaid Other

## 2015-01-11 ENCOUNTER — Ambulatory Visit (HOSPITAL_BASED_OUTPATIENT_CLINIC_OR_DEPARTMENT_OTHER): Payer: Medicaid Other | Admitting: Family

## 2015-01-11 ENCOUNTER — Other Ambulatory Visit (HOSPITAL_BASED_OUTPATIENT_CLINIC_OR_DEPARTMENT_OTHER): Payer: Medicaid Other

## 2015-01-11 ENCOUNTER — Ambulatory Visit (HOSPITAL_BASED_OUTPATIENT_CLINIC_OR_DEPARTMENT_OTHER): Payer: Medicaid Other

## 2015-01-11 VITALS — BP 125/73 | HR 63 | Temp 97.9°F | Resp 16 | Ht 68.0 in | Wt 189.0 lb

## 2015-01-11 DIAGNOSIS — C189 Malignant neoplasm of colon, unspecified: Secondary | ICD-10-CM

## 2015-01-11 DIAGNOSIS — C786 Secondary malignant neoplasm of retroperitoneum and peritoneum: Secondary | ICD-10-CM

## 2015-01-11 DIAGNOSIS — C183 Malignant neoplasm of hepatic flexure: Secondary | ICD-10-CM

## 2015-01-11 DIAGNOSIS — Z5111 Encounter for antineoplastic chemotherapy: Secondary | ICD-10-CM

## 2015-01-11 LAB — CBC WITH DIFFERENTIAL (CANCER CENTER ONLY)
BASO#: 0 10*3/uL (ref 0.0–0.2)
BASO%: 0.5 % (ref 0.0–2.0)
EOS%: 0.6 % (ref 0.0–7.0)
Eosinophils Absolute: 0.1 10*3/uL (ref 0.0–0.5)
HCT: 35.6 % — ABNORMAL LOW (ref 38.7–49.9)
HGB: 12.6 g/dL — ABNORMAL LOW (ref 13.0–17.1)
LYMPH#: 0.8 10*3/uL — ABNORMAL LOW (ref 0.9–3.3)
LYMPH%: 8.7 % — AB (ref 14.0–48.0)
MCH: 38 pg — ABNORMAL HIGH (ref 28.0–33.4)
MCHC: 35.4 g/dL (ref 32.0–35.9)
MCV: 107 fL — ABNORMAL HIGH (ref 82–98)
MONO#: 0.6 10*3/uL (ref 0.1–0.9)
MONO%: 6.6 % (ref 0.0–13.0)
NEUT#: 7.2 10*3/uL — ABNORMAL HIGH (ref 1.5–6.5)
NEUT%: 83.6 % — ABNORMAL HIGH (ref 40.0–80.0)
Platelets: 217 10*3/uL (ref 145–400)
RBC: 3.32 10*6/uL — ABNORMAL LOW (ref 4.20–5.70)
RDW: 15.2 % (ref 11.1–15.7)
WBC: 8.6 10*3/uL (ref 4.0–10.0)

## 2015-01-11 LAB — CMP (CANCER CENTER ONLY)
ALT: 47 U/L (ref 10–47)
AST: 31 U/L (ref 11–38)
Albumin: 3.4 g/dL (ref 3.3–5.5)
Alkaline Phosphatase: 72 U/L (ref 26–84)
BUN, Bld: 16 mg/dL (ref 7–22)
CO2: 28 mEq/L (ref 18–33)
Calcium: 9.5 mg/dL (ref 8.0–10.3)
Chloride: 104 mEq/L (ref 98–108)
Creat: 0.9 mg/dl (ref 0.6–1.2)
GLUCOSE: 122 mg/dL — AB (ref 73–118)
Potassium: 4.4 mEq/L (ref 3.3–4.7)
SODIUM: 143 meq/L (ref 128–145)
Total Bilirubin: 0.6 mg/dl (ref 0.20–1.60)
Total Protein: 6.7 g/dL (ref 6.4–8.1)

## 2015-01-11 LAB — PREALBUMIN: Prealbumin: 39 mg/dL (ref 21–43)

## 2015-01-11 MED ORDER — ATROPINE SULFATE 1 MG/ML IJ SOLN
0.5000 mg | Freq: Once | INTRAMUSCULAR | Status: AC | PRN
  Administered 2015-01-11: 0.5 mg via INTRAVENOUS

## 2015-01-11 MED ORDER — SODIUM CHLORIDE 0.9 % IV SOLN
Freq: Once | INTRAVENOUS | Status: AC
  Administered 2015-01-11: 11:00:00 via INTRAVENOUS
  Filled 2015-01-11: qty 8

## 2015-01-11 MED ORDER — DEXTROSE 5 % IV SOLN
Freq: Once | INTRAVENOUS | Status: AC
  Administered 2015-01-11: 11:00:00 via INTRAVENOUS

## 2015-01-11 MED ORDER — LEUCOVORIN CALCIUM INJECTION 350 MG
196.0000 mg/m2 | Freq: Once | INTRAVENOUS | Status: AC
  Administered 2015-01-11: 350 mg via INTRAVENOUS
  Filled 2015-01-11: qty 17.5

## 2015-01-11 MED ORDER — SODIUM CHLORIDE 0.9 % IJ SOLN
10.0000 mL | INTRAMUSCULAR | Status: DC | PRN
  Filled 2015-01-11: qty 10

## 2015-01-11 MED ORDER — IRINOTECAN HCL CHEMO INJECTION 100 MG/5ML
145.0000 mg/m2 | Freq: Once | INTRAVENOUS | Status: AC
  Administered 2015-01-11: 260 mg via INTRAVENOUS
  Filled 2015-01-11: qty 13

## 2015-01-11 MED ORDER — SODIUM CHLORIDE 0.9 % IV SOLN
2900.0000 mg/m2 | INTRAVENOUS | Status: DC
  Administered 2015-01-11: 5200 mg via INTRAVENOUS
  Filled 2015-01-11: qty 104

## 2015-01-11 MED ORDER — ATROPINE SULFATE 1 MG/ML IJ SOLN
INTRAMUSCULAR | Status: AC
  Filled 2015-01-11: qty 1

## 2015-01-11 MED ORDER — HEPARIN SOD (PORK) LOCK FLUSH 100 UNIT/ML IV SOLN
500.0000 [IU] | Freq: Once | INTRAVENOUS | Status: DC | PRN
  Filled 2015-01-11: qty 5

## 2015-01-11 NOTE — Progress Notes (Signed)
Hematology and Oncology Follow Up Visit  Donald Fry [id number] [birth year] 59 y.o. 01/11/2015   Principle Diagnosis:  Metastatic colon cancer- KRAS wild type  Current Therapy:   Status post cycle 8 of FOLFOXIRI    Interim History:  Donald Fry is here today with his wife for follow-up and cycle 9 of treatment. He is feeling much better since having oxaliplatin held from his treatments. The neuropathy in his feet is gone and he has very mild tingling in his finger tips.  His most recent CT scan showed no obvious of metastatic disease. His liver lesions were stable and felt to be benign.  He still has some fatigue at times but is feeling better.  His appetite has improved and he is staying well hydrated. He has gained 3 lbs since his last visit.  He denies fever, chills, n/v, cough, rash, dizziness, chest pain, palpitations, abdominal pain, changes in bowel of bladder habits. No blood in his urine or stool.  He does have some SOB with exertion. He is still trying to walk in the evenings.  No lymphadenopathy found on assessment. No swelling or tenderness in his extremities. No new aches or pains.  His CEA in July was 1.4.   Medications:    Medication List       This list is accurate as of: 01/11/15  9:56 AM.  Always use your most recent med list.               ALPRAZolam 1 MG tablet  Commonly known as:  XANAX  Take 1 tablet (1 mg total) by mouth 4 (four) times daily as needed for anxiety.     aspirin 81 MG tablet  Take 81 mg by mouth daily.     atenolol 100 MG tablet  Commonly known as:  TENORMIN  Take 100 mg by mouth daily.     eucerin cream  Apply 1 application topically daily. Applied to hands and feet     lidocaine-prilocaine cream  Commonly known as:  EMLA  Apply to affected area once     lovastatin 40 MG tablet  Commonly known as:  MEVACOR  Take 40 mg by mouth at bedtime.     mirtazapine 15 MG tablet  Commonly known as:  REMERON  Take 0.5 tablets (7.5 mg  total) by mouth 2 (two) times daily.     multivitamin tablet  Take 1 tablet by mouth daily.     ondansetron 8 MG tablet  Commonly known as:  ZOFRAN  Take 1 tablet (8 mg total) by mouth 2 (two) times daily. Start the day after chemo for 3 days, then as needed for n&v.     predniSONE 10 MG tablet  Commonly known as:  DELTASONE  TAKE ONE TABLET BY MOUTH ONE TIME DAILY WITH BREAKFAST     prochlorperazine 10 MG tablet  Commonly known as:  COMPAZINE  Take 1 tablet (10 mg total) by mouth every 6 (six) hours as needed (Nausea or vomiting).     senna 8.6 MG Tabs tablet  Commonly known as:  SENOKOT  Take 1 tablet (8.6 mg total) by mouth daily as needed for mild constipation.     temazepam 15 MG capsule  Commonly known as:  RESTORIL  TAKE 1 TABLET BY MOUTH ONCE NIGHTLY AT BEDTIME AS NEEDED FOR SLEEP     traMADol 50 MG tablet  Commonly known as:  ULTRAM  Take 1 tablet (50 mg total) by mouth every 6 (six) hours as  needed for severe pain.        Allergies: No Known Allergies  Past Medical History, Surgical history, Social history, and Family History were reviewed and updated.  Review of Systems: All other 10 point review of systems is negative.   Physical Exam:  vitals were not taken for this visit.  Wt Readings from Last 3 Encounters:  12/29/14 186 lb (84.369 kg)  12/10/14 182 lb (82.555 kg)  12/08/14 181 lb 1.9 oz (82.155 kg)    Ocular: Sclerae unicteric, pupils equal, round and reactive to light Ear-nose-throat: Oropharynx clear, dentition fair Lymphatic: No cervical or supraclavicular adenopathy Lungs no rales or rhonchi, good excursion bilaterally Heart regular rate and rhythm, no murmur appreciated Abd soft, nontender, positive bowel sounds MSK no focal spinal tenderness, no joint edema Neuro: non-focal, well-oriented, appropriate affect  Lab Results  Component Value Date   WBC 8.5 12/29/2014   HGB 12.8* 12/29/2014   HCT 36.3* 12/29/2014   MCV 106* 12/29/2014    PLT 207 12/29/2014   Lab Results  Component Value Date   FERRITIN 189 09/22/2014   IRON 124 09/22/2014   TIBC 236 09/22/2014   UIBC 112* 09/22/2014   IRONPCTSAT 52 09/22/2014   Lab Results  Component Value Date   RBC 3.43* 12/29/2014   No results found for: KPAFRELGTCHN, LAMBDASER, KAPLAMBRATIO No results found for: IGGSERUM, IGA, IGMSERUM No results found for: Odetta Pink, SPEI   Chemistry      Component Value Date/Time   NA 146* 12/29/2014 0909   NA 136 09/01/2014 0400   K 4.4 12/29/2014 0909   K 3.6 09/01/2014 0400   CL 105 12/29/2014 0909   CL 105 09/01/2014 0400   CO2 28 12/29/2014 0909   CO2 24 09/01/2014 0400   BUN 14 12/29/2014 0909   BUN 9 09/01/2014 0400   CREATININE 0.8 12/29/2014 0909   CREATININE 0.54 09/01/2014 0400      Component Value Date/Time   CALCIUM 9.5 12/29/2014 0909   CALCIUM 8.4 09/01/2014 0400   ALKPHOS 79 12/29/2014 0909   ALKPHOS 60 09/01/2014 0400   AST 48* 12/29/2014 0909   AST 23 09/01/2014 0400   ALT 65* 12/29/2014 0909   ALT 13 09/01/2014 0400   BILITOT 0.70 12/29/2014 0909   BILITOT 1.1 09/01/2014 0400     Impression and Plan: Donald Fry is a 59 year old gentleman with metastatic colorectal cancer with extensive peritoneal disease. He has done much better since omitting the oxaliplatin from his treatment. His neuropathy has almost completely resolved. He has a little more energy as well.  His scans in July showed no obvious metastatic disease.  CBC and CMP were ok. We will proceed with cycle 9 as planned.  We will give him a new appointment schedule today.  Both he and his wife know to call us with any questions or concerns. We can certainly see him sooner if need be.   [name], NP 8/9/20169:56 AM

## 2015-01-11 NOTE — Patient Instructions (Signed)
[facility] Discharge Instructions for Patients Receiving Chemotherapy  Today you received the following chemotherapy agents  Irinotecan and 5 Fu  To help prevent nausea and vomiting after your treatment, we encourage you to take your nausea medication    If you develop nausea and vomiting that is not controlled by your nausea medication, call the clinic.   BELOW ARE SYMPTOMS THAT SHOULD BE REPORTED IMMEDIATELY:  *FEVER GREATER THAN 100.5 F  *CHILLS WITH OR WITHOUT FEVER  NAUSEA AND VOMITING THAT IS NOT CONTROLLED WITH YOUR NAUSEA MEDICATION  *UNUSUAL SHORTNESS OF BREATH  *UNUSUAL BRUISING OR BLEEDING  TENDERNESS IN MOUTH AND THROAT WITH OR WITHOUT PRESENCE OF ULCERS  *URINARY PROBLEMS  *BOWEL PROBLEMS  UNUSUAL RASH Items with * indicate a potential emergency and should be followed up as soon as possible.  Feel free to call the clinic you have any questions or concerns. The clinic phone number is [phone].  Please show the [facility] at check-in to the Emergency Department and triage nurse.

## 2015-01-13 ENCOUNTER — Ambulatory Visit (HOSPITAL_BASED_OUTPATIENT_CLINIC_OR_DEPARTMENT_OTHER): Payer: Medicaid Other

## 2015-01-13 DIAGNOSIS — Z452 Encounter for adjustment and management of vascular access device: Secondary | ICD-10-CM

## 2015-01-13 DIAGNOSIS — C189 Malignant neoplasm of colon, unspecified: Secondary | ICD-10-CM | POA: Diagnosis not present

## 2015-01-13 MED ORDER — SODIUM CHLORIDE 0.9 % IJ SOLN
10.0000 mL | INTRAMUSCULAR | Status: DC | PRN
  Administered 2015-01-13: 10 mL
  Filled 2015-01-13: qty 10

## 2015-01-13 MED ORDER — HEPARIN SOD (PORK) LOCK FLUSH 100 UNIT/ML IV SOLN
250.0000 [IU] | Freq: Once | INTRAVENOUS | Status: DC | PRN
  Filled 2015-01-13: qty 5

## 2015-01-13 MED ORDER — ALTEPLASE 2 MG IJ SOLR
2.0000 mg | Freq: Once | INTRAMUSCULAR | Status: DC | PRN
  Filled 2015-01-13: qty 2

## 2015-01-13 MED ORDER — SODIUM CHLORIDE 0.9 % IJ SOLN
3.0000 mL | INTRAMUSCULAR | Status: DC | PRN
  Filled 2015-01-13: qty 10

## 2015-01-13 MED ORDER — HEPARIN SOD (PORK) LOCK FLUSH 100 UNIT/ML IV SOLN
500.0000 [IU] | Freq: Once | INTRAVENOUS | Status: AC | PRN
  Administered 2015-01-13: 500 [IU]
  Filled 2015-01-13: qty 5

## 2015-01-13 NOTE — Patient Instructions (Signed)
Fluorouracil, 5-FU injection What is this medicine? FLUOROURACIL, 5-FU (flure oh YOOR a sil) is a chemotherapy drug. It slows the growth of cancer cells. This medicine is used to treat many types of cancer like breast cancer, colon or rectal cancer, pancreatic cancer, and stomach cancer. This medicine may be used for other purposes; ask your health care provider or pharmacist if you have questions. COMMON BRAND NAME(S): Adrucil What should I tell my health care provider before I take this medicine? They need to know if you have any of these conditions: -blood disorders -dihydropyrimidine dehydrogenase (DPD) deficiency -infection (especially a virus infection such as chickenpox, cold sores, or herpes) -kidney disease -liver disease -malnourished, poor nutrition -recent or ongoing radiation therapy -an unusual or allergic reaction to fluorouracil, other chemotherapy, other medicines, foods, dyes, or preservatives -pregnant or trying to get pregnant -breast-feeding How should I use this medicine? This drug is given as an infusion or injection into a vein. It is administered in a hospital or clinic by a specially trained health care professional. Talk to your pediatrician regarding the use of this medicine in children. Special care may be needed. Overdosage: If you think you have taken too much of this medicine contact a poison control center or emergency room at once. NOTE: This medicine is only for you. Do not share this medicine with others. What if I miss a dose? It is important not to miss your dose. Call your doctor or health care professional if you are unable to keep an appointment. What may interact with this medicine? -allopurinol -cimetidine -dapsone -digoxin -hydroxyurea -leucovorin -levamisole -medicines for seizures like ethotoin, fosphenytoin, phenytoin -medicines to increase blood counts like filgrastim, pegfilgrastim, sargramostim -medicines that treat or prevent blood  clots like warfarin, enoxaparin, and dalteparin -methotrexate -metronidazole -pyrimethamine -some other chemotherapy drugs like busulfan, cisplatin, estramustine, vinblastine -trimethoprim -trimetrexate -vaccines Talk to your doctor or health care professional before taking any of these medicines: -acetaminophen -aspirin -ibuprofen -ketoprofen -naproxen This list may not describe all possible interactions. Give your health care provider a list of all the medicines, herbs, non-prescription drugs, or dietary supplements you use. Also tell them if you smoke, drink alcohol, or use illegal drugs. Some items may interact with your medicine. What should I watch for while using this medicine? Visit your doctor for checks on your progress. This drug may make you feel generally unwell. This is not uncommon, as chemotherapy can affect healthy cells as well as cancer cells. Report any side effects. Continue your course of treatment even though you feel ill unless your doctor tells you to stop. In some cases, you may be given additional medicines to help with side effects. Follow all directions for their use. Call your doctor or health care professional for advice if you get a fever, chills or sore throat, or other symptoms of a cold or flu. Do not treat yourself. This drug decreases your body's ability to fight infections. Try to avoid being around people who are sick. This medicine may increase your risk to bruise or bleed. Call your doctor or health care professional if you notice any unusual bleeding. Be careful brushing and flossing your teeth or using a toothpick because you may get an infection or bleed more easily. If you have any dental work done, tell your dentist you are receiving this medicine. Avoid taking products that contain aspirin, acetaminophen, ibuprofen, naproxen, or ketoprofen unless instructed by your doctor. These medicines may hide a fever. Do not become pregnant while taking this    medicine. Women should inform their doctor if they wish to become pregnant or think they might be pregnant. There is a potential for serious side effects to an unborn child. Talk to your health care professional or pharmacist for more information. Do not breast-feed an infant while taking this medicine. Men should inform their doctor if they wish to father a child. This medicine may lower sperm counts. Do not treat diarrhea with over the counter products. Contact your doctor if you have diarrhea that lasts more than 2 days or if it is severe and watery. This medicine can make you more sensitive to the sun. Keep out of the sun. If you cannot avoid being in the sun, wear protective clothing and use sunscreen. Do not use sun lamps or tanning beds/booths. What side effects may I notice from receiving this medicine? Side effects that you should report to your doctor or health care professional as soon as possible: -allergic reactions like skin rash, itching or hives, swelling of the face, lips, or tongue -low blood counts - this medicine may decrease the number of white blood cells, red blood cells and platelets. You may be at increased risk for infections and bleeding. -signs of infection - fever or chills, cough, sore throat, pain or difficulty passing urine -signs of decreased platelets or bleeding - bruising, pinpoint red spots on the skin, black, tarry stools, blood in the urine -signs of decreased red blood cells - unusually weak or tired, fainting spells, lightheadedness -breathing problems -changes in vision -chest pain -mouth sores -nausea and vomiting -pain, swelling, redness at site where injected -pain, tingling, numbness in the hands or feet -redness, swelling, or sores on hands or feet -stomach pain -unusual bleeding Side effects that usually do not require medical attention (report to your doctor or health care professional if they continue or are bothersome): -changes in finger or  toe nails -diarrhea -dry or itchy skin -hair loss -headache -loss of appetite -sensitivity of eyes to the light -stomach upset -unusually teary eyes This list may not describe all possible side effects. Call your doctor for medical advice about side effects. You may report side effects to FDA at 1-800-FDA-1088. Where should I keep my medicine? This drug is given in a hospital or clinic and will not be stored at home. NOTE: This sheet is a summary. It may not cover all possible information. If you have questions about this medicine, talk to your doctor, pharmacist, or health care provider.  2015, [employer]. (2007-09-24 13:53:16)   

## 2015-01-24 ENCOUNTER — Other Ambulatory Visit (HOSPITAL_BASED_OUTPATIENT_CLINIC_OR_DEPARTMENT_OTHER): Payer: Medicaid Other

## 2015-01-24 ENCOUNTER — Other Ambulatory Visit: Payer: Self-pay | Admitting: *Deleted

## 2015-01-24 ENCOUNTER — Ambulatory Visit (HOSPITAL_BASED_OUTPATIENT_CLINIC_OR_DEPARTMENT_OTHER): Payer: Medicaid Other | Admitting: Hematology & Oncology

## 2015-01-24 ENCOUNTER — Encounter: Payer: Self-pay | Admitting: Hematology & Oncology

## 2015-01-24 ENCOUNTER — Ambulatory Visit (HOSPITAL_BASED_OUTPATIENT_CLINIC_OR_DEPARTMENT_OTHER): Payer: Medicaid Other

## 2015-01-24 VITALS — BP 126/77 | HR 66 | Temp 97.5°F | Resp 16 | Ht 68.0 in | Wt 193.0 lb

## 2015-01-24 DIAGNOSIS — C189 Malignant neoplasm of colon, unspecified: Secondary | ICD-10-CM

## 2015-01-24 DIAGNOSIS — C786 Secondary malignant neoplasm of retroperitoneum and peritoneum: Secondary | ICD-10-CM | POA: Diagnosis not present

## 2015-01-24 DIAGNOSIS — C183 Malignant neoplasm of hepatic flexure: Secondary | ICD-10-CM

## 2015-01-24 DIAGNOSIS — Z5111 Encounter for antineoplastic chemotherapy: Secondary | ICD-10-CM | POA: Diagnosis not present

## 2015-01-24 LAB — CBC WITH DIFFERENTIAL (CANCER CENTER ONLY)
BASO#: 0.1 10*3/uL (ref 0.0–0.2)
BASO%: 0.8 % (ref 0.0–2.0)
EOS%: 1.6 % (ref 0.0–7.0)
Eosinophils Absolute: 0.1 10*3/uL (ref 0.0–0.5)
HEMATOCRIT: 37.7 % — AB (ref 38.7–49.9)
HGB: 12.5 g/dL — ABNORMAL LOW (ref 13.0–17.1)
LYMPH#: 1.2 10*3/uL (ref 0.9–3.3)
LYMPH%: 13.4 % — ABNORMAL LOW (ref 14.0–48.0)
MCH: 36.4 pg — AB (ref 28.0–33.4)
MCHC: 33.2 g/dL (ref 32.0–35.9)
MCV: 110 fL — AB (ref 82–98)
MONO#: 0.9 10*3/uL (ref 0.1–0.9)
MONO%: 9.5 % (ref 0.0–13.0)
NEUT#: 6.8 10*3/uL — ABNORMAL HIGH (ref 1.5–6.5)
NEUT%: 74.7 % (ref 40.0–80.0)
Platelets: 159 10*3/uL (ref 145–400)
RBC: 3.43 10*6/uL — AB (ref 4.20–5.70)
RDW: 14.9 % (ref 11.1–15.7)
WBC: 9 10*3/uL (ref 4.0–10.0)

## 2015-01-24 LAB — CMP (CANCER CENTER ONLY)
ALBUMIN: 3.1 g/dL — AB (ref 3.3–5.5)
ALK PHOS: 69 U/L (ref 26–84)
ALT: 69 U/L — AB (ref 10–47)
AST: 38 U/L (ref 11–38)
BILIRUBIN TOTAL: 0.5 mg/dL (ref 0.20–1.60)
BUN, Bld: 17 mg/dL (ref 7–22)
CALCIUM: 9.1 mg/dL (ref 8.0–10.3)
CO2: 27 mEq/L (ref 18–33)
CREATININE: 0.9 mg/dL (ref 0.6–1.2)
Chloride: 106 mEq/L (ref 98–108)
Glucose, Bld: 151 mg/dL — ABNORMAL HIGH (ref 73–118)
Potassium: 3.5 mEq/L (ref 3.3–4.7)
SODIUM: 141 meq/L (ref 128–145)
TOTAL PROTEIN: 6.2 g/dL — AB (ref 6.4–8.1)

## 2015-01-24 MED ORDER — ATROPINE SULFATE 1 MG/ML IJ SOLN
0.5000 mg | Freq: Once | INTRAMUSCULAR | Status: AC | PRN
  Administered 2015-01-24: 0.5 mg via INTRAVENOUS

## 2015-01-24 MED ORDER — SODIUM CHLORIDE 0.9 % IV SOLN
Freq: Once | INTRAVENOUS | Status: AC
  Administered 2015-01-24: 10:00:00 via INTRAVENOUS
  Filled 2015-01-24: qty 8

## 2015-01-24 MED ORDER — IRINOTECAN HCL CHEMO INJECTION 100 MG/5ML
145.0000 mg/m2 | Freq: Once | INTRAVENOUS | Status: AC
  Administered 2015-01-24: 260 mg via INTRAVENOUS
  Filled 2015-01-24: qty 13

## 2015-01-24 MED ORDER — DEXTROSE 5 % IV SOLN
195.0000 mg/m2 | Freq: Once | INTRAVENOUS | Status: AC
  Administered 2015-01-24: 350 mg via INTRAVENOUS
  Filled 2015-01-24: qty 17.5

## 2015-01-24 MED ORDER — HEPARIN SOD (PORK) LOCK FLUSH 100 UNIT/ML IV SOLN
500.0000 [IU] | Freq: Once | INTRAVENOUS | Status: DC | PRN
  Filled 2015-01-24: qty 5

## 2015-01-24 MED ORDER — ALPRAZOLAM 1 MG PO TABS: 1.0000 mg | ORAL_TABLET | Freq: Four times a day (QID) | ORAL | Status: DC | PRN

## 2015-01-24 MED ORDER — SODIUM CHLORIDE 0.9 % IJ SOLN
10.0000 mL | INTRAMUSCULAR | Status: DC | PRN
  Filled 2015-01-24: qty 10

## 2015-01-24 MED ORDER — ATROPINE SULFATE 1 MG/ML IJ SOLN
INTRAMUSCULAR | Status: AC
  Filled 2015-01-24: qty 1

## 2015-01-24 MED ORDER — DEXTROSE 5 % IV SOLN
Freq: Once | INTRAVENOUS | Status: AC
  Administered 2015-01-24: 09:00:00 via INTRAVENOUS

## 2015-01-24 MED ORDER — SODIUM CHLORIDE 0.9 % IV SOLN
2900.0000 mg/m2 | INTRAVENOUS | Status: DC
  Administered 2015-01-24: 5200 mg via INTRAVENOUS
  Filled 2015-01-24: qty 104

## 2015-01-24 NOTE — Progress Notes (Signed)
Hematology and Oncology Follow Up Visit  MARVIS SAEFONG [id number] [birth year] 59 y.o. 01/24/2015   Principle Diagnosis:  Metastatic colon cancer- KRAS wild type  Current Therapy:   Status post cycle 9 of FOLFOXIRI (oxaliplatin omitted)    Interim History:  Mr. Valeriano is here today with his wife for follow-up . He continues looks great. His weight is now 193 pounds.   We went ahead and did a repeat CT scan on him. The CT scan was done on July 20. This did not show any obvious metastatic disease. There is no obvious growth. There was no abdominal fluid. His liver looked okay. There is no thoracic disease.  He continues to improve nicely. He's had no issues with abdominal pain. He has some occasional bouts of discomfort. He's had no nausea or vomiting. He's had no issues with diarrhea or constipation.  He does have some hemorrhoidal issues. He is having some rectal bleeding but this has improved.  His last CEA was 1.4. This is a was been on the low side.  He's having more problems with neuropathy. As such, I'm going to drop the oxaliplatin. I think this is reasonable given the recent CT scan report.  There's been no bleeding.  He's had no mouth sores. He's had no cough or shortness of breath. He's had no leg swelling.  Overall, his performance status is ECOG 1.   Medications:    Medication List       This list is accurate as of: 01/24/15  9:14 AM.  Always use your most recent med list.               ALPRAZolam 1 MG tablet  Commonly known as:  XANAX  Take 1 tablet (1 mg total) by mouth 4 (four) times daily as needed for anxiety.     aspirin 81 MG tablet  Take 81 mg by mouth daily.     atenolol 100 MG tablet  Commonly known as:  TENORMIN  Take 100 mg by mouth daily.     eucerin cream  Apply 1 application topically daily. Applied to hands and feet     lidocaine-prilocaine cream  Commonly known as:  EMLA  Apply to affected area once     lovastatin 40 MG tablet    Commonly known as:  MEVACOR  Take 40 mg by mouth at bedtime.     mirtazapine 15 MG tablet  Commonly known as:  REMERON  Take 0.5 tablets (7.5 mg total) by mouth 2 (two) times daily.     multivitamin tablet  Take 1 tablet by mouth daily.     ondansetron 8 MG tablet  Commonly known as:  ZOFRAN  Take 1 tablet (8 mg total) by mouth 2 (two) times daily. Start the day after chemo for 3 days, then as needed for n&v.     predniSONE 10 MG tablet  Commonly known as:  DELTASONE  TAKE ONE TABLET BY MOUTH ONE TIME DAILY WITH BREAKFAST     prochlorperazine 10 MG tablet  Commonly known as:  COMPAZINE  Take 1 tablet (10 mg total) by mouth every 6 (six) hours as needed (Nausea or vomiting).     senna 8.6 MG Tabs tablet  Commonly known as:  SENOKOT  Take 1 tablet (8.6 mg total) by mouth daily as needed for mild constipation.     temazepam 15 MG capsule  Commonly known as:  RESTORIL  TAKE 1 TABLET BY MOUTH ONCE NIGHTLY AT BEDTIME AS NEEDED FOR  SLEEP     traMADol 50 MG tablet  Commonly known as:  ULTRAM  Take 1 tablet (50 mg total) by mouth every 6 (six) hours as needed for severe pain.        Allergies: No Known Allergies  Past Medical History, Surgical history, Social history, and Family History were reviewed and updated.  Review of Systems: All other 10 point review of systems is negative.   Physical Exam:  vitals were not taken for this visit.  Wt Readings from Last 3 Encounters:  01/24/15 193 lb (87.544 kg)  01/11/15 189 lb (85.73 kg)  12/29/14 186 lb (84.369 kg)    Well-developed and well-nourished white woman in no obvious distress. Head and neck exam shows no ocular or oral lesions. He has no mucositis. He has no scleral icterus. There is no adenopathy in the neck. Lungs are clear. Cardiac exam regular rate and rhythm with no murmurs, rubs or bruits. Abdomen is soft. He has good bowel sounds. There is no fluid wave. There is no palpable abdominal mass. Has well-healed  laparotomy scar. He has no palpable liver or spleen tip. Extremities shows no clubbing, cyanosis or edema. Skin exam shows no rashes, ecchymoses or petechia.  Lab Results  Component Value Date   WBC 9.0 01/24/2015   HGB 12.5* 01/24/2015   HCT 37.7* 01/24/2015   MCV 110* 01/24/2015   PLT 159 01/24/2015   Lab Results  Component Value Date   FERRITIN 189 09/22/2014   IRON 124 09/22/2014   TIBC 236 09/22/2014   UIBC 112* 09/22/2014   IRONPCTSAT 52 09/22/2014   Lab Results  Component Value Date   RBC 3.43* 01/24/2015   No results found for: KPAFRELGTCHN, LAMBDASER, KAPLAMBRATIO No results found for: IGGSERUM, IGA, IGMSERUM No results found for: Odetta Pink, SPEI   Chemistry      Component Value Date/Time   NA 143 01/11/2015 1014   NA 136 09/01/2014 0400   K 4.4 01/11/2015 1014   K 3.6 09/01/2014 0400   CL 104 01/11/2015 1014   CL 105 09/01/2014 0400   CO2 28 01/11/2015 1014   CO2 24 09/01/2014 0400   BUN 16 01/11/2015 1014   BUN 9 09/01/2014 0400   CREATININE 0.9 01/11/2015 1014   CREATININE 0.54 09/01/2014 0400      Component Value Date/Time   CALCIUM 9.5 01/11/2015 1014   CALCIUM 8.4 09/01/2014 0400   ALKPHOS 72 01/11/2015 1014   ALKPHOS 60 09/01/2014 0400   AST 31 01/11/2015 1014   AST 23 09/01/2014 0400   ALT 47 01/11/2015 1014   ALT 13 09/01/2014 0400   BILITOT 0.60 01/11/2015 1014   BILITOT 1.1 09/01/2014 0400     Impression and Plan: Mr. Callaway is a 59 year old gentleman with metastatic colorectal cancer. He had extensive peritoneal disease. His weight gain tells me everything I need to know. He is responding to treatment quite nicely.  We will continue to treat him. I think that after 12 cycles of treatment, then we might go to get him on maintenance therapy with Xeloda. I think this would be very reasonable.  I am just glad that his quality of life is doing much better. I saw remember ` we first saw him  in March, he was in a wheelchair and really could not do much of anything.  We will plan for another follow-up in 2 weeks.      Volanda Napoleon, MD 8/22/20169:14 AM

## 2015-01-24 NOTE — Patient Instructions (Signed)
[facility] Discharge Instructions for Patients Receiving Chemotherapy  Today you received the following chemotherapy agents  Irinotecan and 5 Fu  To help prevent nausea and vomiting after your treatment, we encourage you to take your nausea medication    If you develop nausea and vomiting that is not controlled by your nausea medication, call the clinic.   BELOW ARE SYMPTOMS THAT SHOULD BE REPORTED IMMEDIATELY:  *FEVER GREATER THAN 100.5 F  *CHILLS WITH OR WITHOUT FEVER  NAUSEA AND VOMITING THAT IS NOT CONTROLLED WITH YOUR NAUSEA MEDICATION  *UNUSUAL SHORTNESS OF BREATH  *UNUSUAL BRUISING OR BLEEDING  TENDERNESS IN MOUTH AND THROAT WITH OR WITHOUT PRESENCE OF ULCERS  *URINARY PROBLEMS  *BOWEL PROBLEMS  UNUSUAL RASH Items with * indicate a potential emergency and should be followed up as soon as possible.  Feel free to call the clinic you have any questions or concerns. The clinic phone number is [phone].  Please show the [facility] at check-in to the Emergency Department and triage nurse.

## 2015-01-25 ENCOUNTER — Ambulatory Visit: Payer: Medicaid Other

## 2015-01-25 ENCOUNTER — Other Ambulatory Visit: Payer: Medicaid Other

## 2015-01-25 LAB — CEA: CEA: 1.2 ng/mL (ref 0.0–5.0)

## 2015-01-25 LAB — PREALBUMIN: Prealbumin: 34 mg/dL (ref 21–43)

## 2015-01-26 ENCOUNTER — Ambulatory Visit (HOSPITAL_BASED_OUTPATIENT_CLINIC_OR_DEPARTMENT_OTHER): Payer: Medicaid Other

## 2015-01-26 VITALS — BP 117/77 | HR 70 | Temp 98.2°F | Resp 18

## 2015-01-26 DIAGNOSIS — Z452 Encounter for adjustment and management of vascular access device: Secondary | ICD-10-CM | POA: Diagnosis present

## 2015-01-26 DIAGNOSIS — C183 Malignant neoplasm of hepatic flexure: Secondary | ICD-10-CM

## 2015-01-26 DIAGNOSIS — C189 Malignant neoplasm of colon, unspecified: Secondary | ICD-10-CM

## 2015-01-26 MED ORDER — SODIUM CHLORIDE 0.9 % IJ SOLN
10.0000 mL | INTRAMUSCULAR | Status: DC | PRN
  Administered 2015-01-26: 10 mL
  Filled 2015-01-26: qty 10

## 2015-01-26 MED ORDER — HEPARIN SOD (PORK) LOCK FLUSH 100 UNIT/ML IV SOLN
500.0000 [IU] | Freq: Once | INTRAVENOUS | Status: AC | PRN
  Administered 2015-01-26: 500 [IU]
  Filled 2015-01-26: qty 5

## 2015-01-26 NOTE — Patient Instructions (Signed)
Fluorouracil, 5FU; Diclofenac topical cream What is this medicine? FLUOROURACIL; DICLOFENAC (flure oh YOOR a sil; dye KLOE fen ak) is a combination of a topical chemotherapy agent and non-steroidal anti-inflammatory drug (NSAID). It is used on the skin to treat skin cancer and skin conditions that could become cancer. This medicine may be used for other purposes; ask your health care provider or pharmacist if you have questions. COMMON BRAND NAME(S): FLUORAC What should I tell my health care provider before I take this medicine? They need to know if you have any of these conditions: -bleeding problems -cigarette smoker -DPD enzyme deficiency -heart disease -high blood pressure -if you frequently drink alcohol containing drinks -kidney disease -liver disease -open or infected skin -stomach problems -swelling or open sores at the treatment site -recent or planned coronary artery bypass graft (CABG) surgery -an unusual or allergic reaction to fluorouracil, diclofenac, aspirin, other NSAIDs, other medicines, foods, dyes, or preservatives -pregnant or trying to get pregnant -breast-feeding How should I use this medicine? This medicine is only for use on the skin. Follow the directions on the prescription label. Wash hands before and after use. Wash affected area and gently pat dry. To apply this medicine use a cotton-tipped applicator, or use gloves if applying with fingertips. If applied with unprotected fingertips, it is very important to wash your hands well after you apply this medicine. Avoid applying to the eyes, nose, or mouth. Apply enough medicine to cover the affected area. You can cover the area with a light gauze dressing, but do not use tight or air-tight dressings. Finish the full course prescribed by your doctor or health care professional, even if you think your condition is better. Do not stop taking except on the advice of your doctor or health care professional. Talk to your  pediatrician regarding the use of this medicine in children. Special care may be needed. Overdosage: If you think you've taken too much of this medicine contact a poison control center or emergency room at once. Overdosage: If you think you have taken too much of this medicine contact a poison control center or emergency room at once. NOTE: This medicine is only for you. Do not share this medicine with others. What if I miss a dose? If you miss a dose, apply it as soon as you can. If it is almost time for your next dose, only use that dose. Do not apply extra doses. Contact your doctor or health care professional if you miss more than one dose. What may interact with this medicine? Interactions are not expected. Do not use any other skin products without telling your doctor or health care professional. This list may not describe all possible interactions. Give your health care provider a list of all the medicines, herbs, non-prescription drugs, or dietary supplements you use. Also tell them if you smoke, drink alcohol, or use illegal drugs. Some items may interact with your medicine. What should I watch for while using this medicine? Visit your doctor or health care professional for checks on your progress. You will need to use this medicine for 2 to 6 weeks. This may be longer depending on the condition being treated. You may not see full healing for another 1 to 2 months after you stop using the medicine. Treated areas of skin can look unsightly during and for several weeks after treatment with this medicine. This medicine can make you more sensitive to the sun. Keep out of the sun. If you cannot avoid being in   the sun, wear protective clothing and use sunscreen. Do not use sun lamps or tanning beds/booths. Where should I keep my What side effects may I notice from receiving this medicine? Side effects that you should report to your doctor or health care professional as soon as possible: -allergic  reactions like skin rash, itching or hives, swelling of the face, lips, or tongue -black or bloody stools, blood in the urine or vomit -blurred vision -chest pain -difficulty breathing or wheezing -redness, blistering, peeling or loosening of the skin, including inside the mouth -severe redness and swelling of normal skin -slurred speech or weakness on one side of the body -trouble passing urine or change in the amount of urine -unexplained weight gain or swelling -unusually weak or tired -yellowing of eyes or skin Side effects that usually do not require medical attention (Report these to your doctor or health care professional if they continue or are bothersome.): -increased sensitivity of the skin to sun and ultraviolet light -pain and burning of the affected area -scaling or swelling of the affected area -skin rash, itching of the affected area -tenderness This list may not describe all possible side effects. Call your doctor for medical advice about side effects. You may report side effects to FDA at 1-800-FDA-1088. Where should I keep my medicine? Keep out of the reach of children. Store at room temperature between 20 and 25 degrees C (68 and 77 degrees F). Throw away any unused medicine after the expiration date. NOTE: This sheet is a summary. It may not cover all possible information. If you have questions about this medicine, talk to your doctor, pharmacist, or health care provider.  2015, [employer] Standard. (2013-09-21 11:09:58)  

## 2015-02-08 ENCOUNTER — Ambulatory Visit (HOSPITAL_BASED_OUTPATIENT_CLINIC_OR_DEPARTMENT_OTHER): Payer: Medicaid Other | Admitting: Hematology & Oncology

## 2015-02-08 ENCOUNTER — Other Ambulatory Visit (HOSPITAL_BASED_OUTPATIENT_CLINIC_OR_DEPARTMENT_OTHER): Payer: Medicaid Other

## 2015-02-08 ENCOUNTER — Encounter: Payer: Self-pay | Admitting: Hematology & Oncology

## 2015-02-08 ENCOUNTER — Ambulatory Visit (HOSPITAL_BASED_OUTPATIENT_CLINIC_OR_DEPARTMENT_OTHER): Payer: Medicaid Other

## 2015-02-08 VITALS — BP 148/77 | HR 64 | Temp 98.0°F | Resp 18 | Ht 68.0 in | Wt 195.0 lb

## 2015-02-08 DIAGNOSIS — C183 Malignant neoplasm of hepatic flexure: Secondary | ICD-10-CM

## 2015-02-08 DIAGNOSIS — C786 Secondary malignant neoplasm of retroperitoneum and peritoneum: Secondary | ICD-10-CM

## 2015-02-08 DIAGNOSIS — Z5111 Encounter for antineoplastic chemotherapy: Secondary | ICD-10-CM | POA: Diagnosis present

## 2015-02-08 DIAGNOSIS — C189 Malignant neoplasm of colon, unspecified: Secondary | ICD-10-CM

## 2015-02-08 LAB — CMP (CANCER CENTER ONLY)
ALT(SGPT): 73 U/L — ABNORMAL HIGH (ref 10–47)
AST: 45 U/L — ABNORMAL HIGH (ref 11–38)
Albumin: 3.4 g/dL (ref 3.3–5.5)
Alkaline Phosphatase: 66 U/L (ref 26–84)
BUN: 17 mg/dL (ref 7–22)
CHLORIDE: 103 meq/L (ref 98–108)
CO2: 27 meq/L (ref 18–33)
CREATININE: 0.8 mg/dL (ref 0.6–1.2)
Calcium: 9.2 mg/dL (ref 8.0–10.3)
GLUCOSE: 142 mg/dL — AB (ref 73–118)
POTASSIUM: 3.6 meq/L (ref 3.3–4.7)
SODIUM: 140 meq/L (ref 128–145)
TOTAL PROTEIN: 6.2 g/dL — AB (ref 6.4–8.1)
Total Bilirubin: 0.6 mg/dl (ref 0.20–1.60)

## 2015-02-08 LAB — CBC WITH DIFFERENTIAL (CANCER CENTER ONLY)
BASO#: 0.1 10*3/uL (ref 0.0–0.2)
BASO%: 0.8 % (ref 0.0–2.0)
EOS ABS: 0.1 10*3/uL (ref 0.0–0.5)
EOS%: 1.3 % (ref 0.0–7.0)
HCT: 37.9 % — ABNORMAL LOW (ref 38.7–49.9)
HGB: 12.9 g/dL — ABNORMAL LOW (ref 13.0–17.1)
LYMPH#: 1 10*3/uL (ref 0.9–3.3)
LYMPH%: 11.2 % — AB (ref 14.0–48.0)
MCH: 36.6 pg — AB (ref 28.0–33.4)
MCHC: 34 g/dL (ref 32.0–35.9)
MCV: 108 fL — AB (ref 82–98)
MONO#: 0.8 10*3/uL (ref 0.1–0.9)
MONO%: 9 % (ref 0.0–13.0)
NEUT#: 6.8 10*3/uL — ABNORMAL HIGH (ref 1.5–6.5)
NEUT%: 77.7 % (ref 40.0–80.0)
PLATELETS: 200 10*3/uL (ref 145–400)
RBC: 3.52 10*6/uL — AB (ref 4.20–5.70)
RDW: 14.5 % (ref 11.1–15.7)
WBC: 8.8 10*3/uL (ref 4.0–10.0)

## 2015-02-08 MED ORDER — IRINOTECAN HCL CHEMO INJECTION 100 MG/5ML
145.0000 mg/m2 | Freq: Once | INTRAVENOUS | Status: AC
  Administered 2015-02-08: 260 mg via INTRAVENOUS
  Filled 2015-02-08: qty 13

## 2015-02-08 MED ORDER — ATROPINE SULFATE 1 MG/ML IJ SOLN
0.5000 mg | Freq: Once | INTRAMUSCULAR | Status: AC | PRN
  Administered 2015-02-08: 0.5 mg via INTRAVENOUS

## 2015-02-08 MED ORDER — ATROPINE SULFATE 1 MG/ML IJ SOLN
INTRAMUSCULAR | Status: AC
  Filled 2015-02-08: qty 1

## 2015-02-08 MED ORDER — SODIUM CHLORIDE 0.9 % IV SOLN
2900.0000 mg/m2 | INTRAVENOUS | Status: DC
  Administered 2015-02-08: 5200 mg via INTRAVENOUS
  Filled 2015-02-08: qty 104

## 2015-02-08 MED ORDER — DEXTROSE 5 % IV SOLN
195.0000 mg/m2 | Freq: Once | INTRAVENOUS | Status: AC
  Administered 2015-02-08: 350 mg via INTRAVENOUS
  Filled 2015-02-08: qty 17.5

## 2015-02-08 MED ORDER — SODIUM CHLORIDE 0.9 % IV SOLN
Freq: Once | INTRAVENOUS | Status: AC
  Administered 2015-02-08: 11:00:00 via INTRAVENOUS
  Filled 2015-02-08: qty 8

## 2015-02-08 NOTE — Progress Notes (Signed)
Hematology and Oncology Follow Up Visit  IZAIHA LO [id number] 01-05-1956 59 y.o. 02/08/2015   Principle Diagnosis:  Metastatic colon cancer- KRAS wild type  Current Therapy:   Status post cycle 9 of FOLFOXIRI (oxaliplatin omitted)    Interim History:  Mr. Buss is here today with his wife for follow-up . He continues looks great. His weight is now 195 pounds.   He had a very good Labor Day weekend. He ate. He feels good. He's had no abdominal discomfort. He does state that he had a little bit of abdominal cramping in the upper abdomen. This seemed to go away on its own.  He's had no problems with bowels or bladder. He's had no constipation or diarrhea.  He has had no leg swelling.  He's had no rashes.  He is not smoking now. He's had no cough. He's had no shortness of breath.  His last CEA was 1.2. This is a was been on the low side. As such, I don't know if we can use this as a marker.  There's been no bleeding.  Overall, his performance status is ECOG 1.   Medications:    Medication List       This list is accurate as of: 02/08/15 10:23 AM.  Always use your most recent med list.               ALPRAZolam 1 MG tablet  Commonly known as:  XANAX  Take 1 tablet (1 mg total) by mouth 4 (four) times daily as needed for anxiety.     aspirin 81 MG tablet  Take 81 mg by mouth daily.     atenolol 100 MG tablet  Commonly known as:  TENORMIN  Take 100 mg by mouth daily.     eucerin cream  Apply 1 application topically daily. Applied to hands and feet     lidocaine-prilocaine cream  Commonly known as:  EMLA  Apply to affected area once     lovastatin 40 MG tablet  Commonly known as:  MEVACOR  Take 40 mg by mouth at bedtime.     mirtazapine 15 MG tablet  Commonly known as:  REMERON  Take 0.5 tablets (7.5 mg total) by mouth 2 (two) times daily.     multivitamin tablet  Take 1 tablet by mouth daily.     ondansetron 8 MG tablet  Commonly known as:  ZOFRAN    Take 1 tablet (8 mg total) by mouth 2 (two) times daily. Start the day after chemo for 3 days, then as needed for n&v.     predniSONE 10 MG tablet  Commonly known as:  DELTASONE  TAKE ONE TABLET BY MOUTH ONE TIME DAILY WITH BREAKFAST     prochlorperazine 10 MG tablet  Commonly known as:  COMPAZINE  Take 1 tablet (10 mg total) by mouth every 6 (six) hours as needed (Nausea or vomiting).     senna 8.6 MG Tabs tablet  Commonly known as:  SENOKOT  Take 1 tablet (8.6 mg total) by mouth daily as needed for mild constipation.     temazepam 15 MG capsule  Commonly known as:  RESTORIL  TAKE 1 TABLET BY MOUTH ONCE NIGHTLY AT BEDTIME AS NEEDED FOR SLEEP     traMADol 50 MG tablet  Commonly known as:  ULTRAM  Take 1 tablet (50 mg total) by mouth every 6 (six) hours as needed for severe pain.        Allergies: No Known Allergies  Past Medical History, Surgical history, Social history, and Family History were reviewed and updated.  Review of Systems: All other 10 point review of systems is negative.   Physical Exam:  height is $RemoveB'5\' 8"'KHgFYaBD$  (1.727 m) and weight is 195 lb (88.451 kg). His oral temperature is 98 F (36.7 C). His blood pressure is 148/77 and his pulse is 64. His respiration is 18.   Wt Readings from Last 3 Encounters:  02/08/15 195 lb (88.451 kg)  01/24/15 193 lb (87.544 kg)  01/11/15 189 lb (85.73 kg)    Well-developed and well-nourished white woman in no obvious distress. Head and neck exam shows no ocular or oral lesions. He has no mucositis. He has no scleral icterus. There is no adenopathy in the neck. Lungs are clear. Cardiac exam regular rate and rhythm with no murmurs, rubs or bruits. Abdomen is soft. He has good bowel sounds. There is no fluid wave. There is no palpable abdominal mass. Has well-healed laparotomy scar. He has no palpable liver or spleen tip. Extremities shows no clubbing, cyanosis or edema. Skin exam shows no rashes, ecchymoses or petechia.  Lab Results   Component Value Date   WBC 8.8 02/08/2015   HGB 12.9* 02/08/2015   HCT 37.9* 02/08/2015   MCV 108* 02/08/2015   PLT 200 02/08/2015   Lab Results  Component Value Date   FERRITIN 189 09/22/2014   IRON 124 09/22/2014   TIBC 236 09/22/2014   UIBC 112* 09/22/2014   IRONPCTSAT 52 09/22/2014   Lab Results  Component Value Date   RBC 3.52* 02/08/2015   No results found for: KPAFRELGTCHN, LAMBDASER, KAPLAMBRATIO No results found for: IGGSERUM, IGA, IGMSERUM No results found for: Odetta Pink, SPEI   Chemistry      Component Value Date/Time   NA 140 02/08/2015 0856   NA 136 09/01/2014 0400   K 3.6 02/08/2015 0856   K 3.6 09/01/2014 0400   CL 103 02/08/2015 0856   CL 105 09/01/2014 0400   CO2 27 02/08/2015 0856   CO2 24 09/01/2014 0400   BUN 17 02/08/2015 0856   BUN 9 09/01/2014 0400   CREATININE 0.8 02/08/2015 0856   CREATININE 0.54 09/01/2014 0400      Component Value Date/Time   CALCIUM 9.2 02/08/2015 0856   CALCIUM 8.4 09/01/2014 0400   ALKPHOS 66 02/08/2015 0856   ALKPHOS 60 09/01/2014 0400   AST 45* 02/08/2015 0856   AST 23 09/01/2014 0400   ALT 73* 02/08/2015 0856   ALT 13 09/01/2014 0400   BILITOT 0.60 02/08/2015 0856   BILITOT 1.1 09/01/2014 0400     Impression and Plan: Mr. Winegar is a 59 year old gentleman with metastatic colorectal cancer. He had extensive peritoneal disease. His weight gain tells me everything I need to know. He is responding to treatment quite nicely.  We will continue to treat him. I think that after 12 cycles of treatment, then we might go to get him on maintenance therapy with Xeloda. At that point, I think we also could add Avastin. I think this should be reasonable. I think the risk of toxicity would be low.   I am just glad that his quality of life is doing much better. I saw remember ` we first saw him in March, he was in a wheelchair and really could not do much of anything.  We  will plan for another follow-up in 2 weeks.      Volanda Napoleon, MD 9/6/201610:23  AM   

## 2015-02-08 NOTE — Patient Instructions (Signed)
[facility] Discharge Instructions for Patients Receiving Chemotherapy  Today you received the following chemotherapy agents Leucovorin and Camptosar and 5FU.  To help prevent nausea and vomiting after your treatment, we encourage you to take your nausea medication.   If you develop nausea and vomiting that is not controlled by your nausea medication, call the clinic.   BELOW ARE SYMPTOMS THAT SHOULD BE REPORTED IMMEDIATELY:  *FEVER GREATER THAN 100.5 F  *CHILLS WITH OR WITHOUT FEVER  NAUSEA AND VOMITING THAT IS NOT CONTROLLED WITH YOUR NAUSEA MEDICATION  *UNUSUAL SHORTNESS OF BREATH  *UNUSUAL BRUISING OR BLEEDING  TENDERNESS IN MOUTH AND THROAT WITH OR WITHOUT PRESENCE OF ULCERS  *URINARY PROBLEMS  *BOWEL PROBLEMS  UNUSUAL RASH Items with * indicate a potential emergency and should be followed up as soon as possible.  Feel free to call the clinic you have any questions or concerns. The clinic phone number is [phone].  Please show the [facility] at check-in to the Emergency Department and triage nurse.

## 2015-02-09 LAB — CEA: CEA: 1.2 ng/mL (ref 0.0–5.0)

## 2015-02-10 ENCOUNTER — Ambulatory Visit (HOSPITAL_BASED_OUTPATIENT_CLINIC_OR_DEPARTMENT_OTHER): Payer: Medicaid Other

## 2015-02-10 VITALS — BP 127/66 | HR 65 | Temp 98.2°F | Resp 18

## 2015-02-10 DIAGNOSIS — C786 Secondary malignant neoplasm of retroperitoneum and peritoneum: Secondary | ICD-10-CM | POA: Diagnosis not present

## 2015-02-10 DIAGNOSIS — C183 Malignant neoplasm of hepatic flexure: Secondary | ICD-10-CM

## 2015-02-10 DIAGNOSIS — C189 Malignant neoplasm of colon, unspecified: Secondary | ICD-10-CM

## 2015-02-10 MED ORDER — HEPARIN SOD (PORK) LOCK FLUSH 100 UNIT/ML IV SOLN
500.0000 [IU] | Freq: Once | INTRAVENOUS | Status: AC | PRN
  Administered 2015-02-10: 500 [IU]
  Filled 2015-02-10: qty 5

## 2015-02-10 MED ORDER — SODIUM CHLORIDE 0.9 % IJ SOLN
10.0000 mL | INTRAMUSCULAR | Status: DC | PRN
  Administered 2015-02-10: 10 mL
  Filled 2015-02-10: qty 10

## 2015-02-10 NOTE — Patient Instructions (Signed)
Implanted Port Insertion  An implanted port is a central line that has a round shape and is placed under the skin. It is used as a long-term IV access for:    Medicines, such as chemotherapy.    Fluids.    Liquid nutrition, such as total parenteral nutrition (TPN).    Blood samples.   LET YOUR HEALTH CARE PROVIDER KNOW ABOUT:   Allergies to food or medicine.    Medicines taken, including vitamins, herbs, eye drops, creams, and over-the-counter medicines.    Any allergies to heparin.   Use of steroids (by mouth or creams).    Previous problems with anesthetics or numbing medicines.    History of bleeding problems or blood clots.    Previous surgery.    Other health problems, including diabetes and kidney problems.    Possibility of pregnancy, if this applies.  RISKS AND COMPLICATIONS  Generally, this is a safe procedure. However, as with any procedure, problems can occur. Possible problems include:   Damage to the blood vessel, bruising, or bleeding at the puncture site.    Infection.   Blood clot in the vessel that the port is in.   Breakdown of the skin over your port.   Very rarely a person may develop a condition called a pneumothorax, a collection of air in the chest that may cause one of the lungs to collapse. The placement of these catheters with the appropriate imaging guidance significantly decreases the risk of a pneumothorax.   BEFORE THE PROCEDURE    Your health care provider may want you to have blood tests. These tests can help tell how well your kidneys and liver are working. They can also show how well your blood clots.    If you take blood thinners (anticoagulant medicines), ask your health care provider when you should stop taking them.    Make arrangements for someone to drive you home. This is necessary if you have been sedated for your procedure.   PROCEDURE   Port insertion usually takes about 30-45 minutes.    An IV needle will be inserted in your arm.  Medicine for pain and medicine to help relax you (sedative) will flow directly into your body through this needle.    You will lie on an exam table, and you will be connected to monitors to keep track of your heart rate, blood pressure, and breathing throughout the procedure.   An oxygen monitoring device may be attached to your finger. Oxygen will be given.    Everything will be kept as germ free (sterile) as possible during the procedure. The skin near the point of the incision will be cleansed with antiseptic, and the area will be draped with sterile towels. The skin and deeper tissues over the port area will be made numb with a local anesthetic.   Two small cuts (incisions) will be made in the skin to insert the port. One will be made in the neck to obtain access to the vein where the catheter will lie.    Because the port reservoir will be placed under the skin, a small skin incision will be made in the upper chest, and a small pocket for the port will be made under the skin. The catheter that will be connected to the port tunnels to a large central vein in the chest. A small, raised area will remain on your body at the site of the reservoir when the procedure is complete.   The port placement   will be done under imaging guidance to ensure the proper placement.   The reservoir has a silicone covering that can be punctured with a special needle.    The port will be flushed with normal saline, and blood will be drawn to make sure it is working properly.   There will be nothing remaining outside the skin when the procedure is finished.    Incisions will be held together by stitches, surgical glue, or a special tape.  AFTER THE PROCEDURE   You will stay in a recovery area until the anesthesia has worn off. Your blood pressure and pulse will be checked.   A final chest X-ray will be taken to check the placement of the port and to ensure that there is no injury to your lung.  Document Released:  03/11/2013 Document Revised: 10/05/2013 Document Reviewed: 03/11/2013  ExitCare Patient Information 2015 [employer]. This information is not intended to replace advice given to you by your health care provider. Make sure you discuss any questions you have with your health care provider.

## 2015-02-15 ENCOUNTER — Other Ambulatory Visit: Payer: Self-pay | Admitting: Hematology & Oncology

## 2015-02-22 ENCOUNTER — Other Ambulatory Visit (HOSPITAL_BASED_OUTPATIENT_CLINIC_OR_DEPARTMENT_OTHER): Payer: Medicaid Other

## 2015-02-22 ENCOUNTER — Ambulatory Visit (HOSPITAL_BASED_OUTPATIENT_CLINIC_OR_DEPARTMENT_OTHER): Payer: Medicaid Other | Admitting: Family

## 2015-02-22 ENCOUNTER — Telehealth: Payer: Self-pay | Admitting: Hematology & Oncology

## 2015-02-22 ENCOUNTER — Ambulatory Visit (HOSPITAL_BASED_OUTPATIENT_CLINIC_OR_DEPARTMENT_OTHER): Payer: Medicaid Other

## 2015-02-22 ENCOUNTER — Encounter: Payer: Self-pay | Admitting: Family

## 2015-02-22 VITALS — BP 145/79 | HR 64 | Temp 97.8°F | Resp 16 | Ht 68.0 in | Wt 197.0 lb

## 2015-02-22 DIAGNOSIS — C786 Secondary malignant neoplasm of retroperitoneum and peritoneum: Secondary | ICD-10-CM | POA: Diagnosis not present

## 2015-02-22 DIAGNOSIS — C189 Malignant neoplasm of colon, unspecified: Secondary | ICD-10-CM

## 2015-02-22 DIAGNOSIS — C183 Malignant neoplasm of hepatic flexure: Secondary | ICD-10-CM

## 2015-02-22 DIAGNOSIS — Z5111 Encounter for antineoplastic chemotherapy: Secondary | ICD-10-CM

## 2015-02-22 LAB — CMP (CANCER CENTER ONLY)
ALBUMIN: 3.3 g/dL (ref 3.3–5.5)
ALK PHOS: 73 U/L (ref 26–84)
ALT: 68 U/L — AB (ref 10–47)
AST: 37 U/L (ref 11–38)
BUN: 15 mg/dL (ref 7–22)
CALCIUM: 8.9 mg/dL (ref 8.0–10.3)
CHLORIDE: 104 meq/L (ref 98–108)
CO2: 29 mEq/L (ref 18–33)
Creat: 0.9 mg/dl (ref 0.6–1.2)
Glucose, Bld: 122 mg/dL — ABNORMAL HIGH (ref 73–118)
POTASSIUM: 3.9 meq/L (ref 3.3–4.7)
Sodium: 142 mEq/L (ref 128–145)
TOTAL PROTEIN: 6.5 g/dL (ref 6.4–8.1)
Total Bilirubin: 0.6 mg/dl (ref 0.20–1.60)

## 2015-02-22 LAB — CBC WITH DIFFERENTIAL (CANCER CENTER ONLY)
BASO#: 0.1 10*3/uL (ref 0.0–0.2)
BASO%: 0.9 % (ref 0.0–2.0)
EOS ABS: 0.1 10*3/uL (ref 0.0–0.5)
EOS%: 1.3 % (ref 0.0–7.0)
HEMATOCRIT: 38.8 % (ref 38.7–49.9)
HGB: 12.7 g/dL — ABNORMAL LOW (ref 13.0–17.1)
LYMPH#: 0.8 10*3/uL — AB (ref 0.9–3.3)
LYMPH%: 8.2 % — AB (ref 14.0–48.0)
MCH: 35.7 pg — AB (ref 28.0–33.4)
MCHC: 32.7 g/dL (ref 32.0–35.9)
MCV: 109 fL — AB (ref 82–98)
MONO#: 0.9 10*3/uL (ref 0.1–0.9)
MONO%: 9 % (ref 0.0–13.0)
NEUT#: 8.1 10*3/uL — ABNORMAL HIGH (ref 1.5–6.5)
NEUT%: 80.6 % — ABNORMAL HIGH (ref 40.0–80.0)
PLATELETS: 188 10*3/uL (ref 145–400)
RBC: 3.56 10*6/uL — AB (ref 4.20–5.70)
RDW: 14.7 % (ref 11.1–15.7)
WBC: 10 10*3/uL (ref 4.0–10.0)

## 2015-02-22 MED ORDER — ATROPINE SULFATE 1 MG/ML IJ SOLN
INTRAMUSCULAR | Status: AC
  Filled 2015-02-22: qty 1

## 2015-02-22 MED ORDER — IRINOTECAN HCL CHEMO INJECTION 100 MG/5ML
145.0000 mg/m2 | Freq: Once | INTRAVENOUS | Status: AC
  Administered 2015-02-22: 260 mg via INTRAVENOUS
  Filled 2015-02-22: qty 10

## 2015-02-22 MED ORDER — LEUCOVORIN CALCIUM INJECTION 350 MG
196.0000 mg/m2 | Freq: Once | INTRAVENOUS | Status: AC
  Administered 2015-02-22: 350 mg via INTRAVENOUS
  Filled 2015-02-22: qty 17.5

## 2015-02-22 MED ORDER — SODIUM CHLORIDE 0.9 % IV SOLN
2900.0000 mg/m2 | INTRAVENOUS | Status: DC
  Administered 2015-02-22: 5200 mg via INTRAVENOUS
  Filled 2015-02-22: qty 104

## 2015-02-22 MED ORDER — DEXTROSE 5 % IV SOLN
Freq: Once | INTRAVENOUS | Status: AC
  Administered 2015-02-22: 09:00:00 via INTRAVENOUS

## 2015-02-22 MED ORDER — ATROPINE SULFATE 1 MG/ML IJ SOLN
0.5000 mg | Freq: Once | INTRAMUSCULAR | Status: AC | PRN
  Administered 2015-02-22: 0.5 mg via INTRAVENOUS

## 2015-02-22 MED ORDER — SODIUM CHLORIDE 0.9 % IV SOLN
Freq: Once | INTRAVENOUS | Status: AC
  Administered 2015-02-22: 10:00:00 via INTRAVENOUS
  Filled 2015-02-22: qty 8

## 2015-02-22 NOTE — Patient Instructions (Signed)
[facility] Discharge Instructions for Patients Receiving Chemotherapy  Today you received the following chemotherapy agents Camptosar, Leucovorin and 5FU.   To help prevent nausea and vomiting after your treatment, we encourage you to take your nausea medication.   If you develop nausea and vomiting that is not controlled by your nausea medication, call the clinic.   BELOW ARE SYMPTOMS THAT SHOULD BE REPORTED IMMEDIATELY:  *FEVER GREATER THAN 100.5 F  *CHILLS WITH OR WITHOUT FEVER  NAUSEA AND VOMITING THAT IS NOT CONTROLLED WITH YOUR NAUSEA MEDICATION  *UNUSUAL SHORTNESS OF BREATH  *UNUSUAL BRUISING OR BLEEDING  TENDERNESS IN MOUTH AND THROAT WITH OR WITHOUT PRESENCE OF ULCERS  *URINARY PROBLEMS  *BOWEL PROBLEMS  UNUSUAL RASH Items with * indicate a potential emergency and should be followed up as soon as possible.  Feel free to call the clinic you have any questions or concerns. The clinic phone number is [phone].  Please show the CHEMO ALERT CARD at check-in to the Emergency Department and triage nurse.   

## 2015-02-22 NOTE — Progress Notes (Signed)
Hematology and Oncology Follow Up Visit  Donald Fry [id number] 04/11/1956 59 y.o. 02/22/2015   Principle Diagnosis:  Metastatic colon cancer- KRAS wild type  Current Therapy:   Status post cycle 11 of FOLFOXIRI    Interim History:  Donald Fry is here today with his wife for follow-up and his 12th and final of treatment before starting on maintenance therapy with Xeloda. .  He has some nausea and fatigue for several days after each cycle. Despite this he has done really well. He has a great appetite and is staying hydrated. His weight is up at 197 lbs.  He is still staying active walking 1 mile every day. He has some mild SOB with exertion that resolves with taking a break to rest.  There has been no issue with infection. He denies fever, chills, n/v, cough, rash, dizziness, chest pain, palpitations, changes in bowel of bladder habits. No blood in his urine or stool.  He has occasional abdominal cramping. This comes and goes. His BMs are regular twice a day.  He has had no swelling,tenderness, numbness or tingling in his extremities. No c/o pain at this time.  His CEA earlier this month was 1.2.   Medications:    Medication List       This list is accurate as of: 02/22/15  8:44 AM.  Always use your most recent med list.               ALPRAZolam 1 MG tablet  Commonly known as:  XANAX  Take 1 tablet (1 mg total) by mouth 4 (four) times daily as needed for anxiety.     aspirin 81 MG tablet  Take 81 mg by mouth daily.     atenolol 100 MG tablet  Commonly known as:  TENORMIN  Take 100 mg by mouth daily.     eucerin cream  Apply 1 application topically daily. Applied to hands and feet     lidocaine-prilocaine cream  Commonly known as:  EMLA  Apply to affected area once     lovastatin 40 MG tablet  Commonly known as:  MEVACOR  Take 40 mg by mouth at bedtime.     mirtazapine 15 MG tablet  Commonly known as:  REMERON  Take 0.5 tablets (7.5 mg total) by mouth 2 (two)  times daily.     multivitamin tablet  Take 1 tablet by mouth daily.     ondansetron 8 MG tablet  Commonly known as:  ZOFRAN  Take 1 tablet (8 mg total) by mouth 2 (two) times daily. Start the day after chemo for 3 days, then as needed for n&v.     predniSONE 10 MG tablet  Commonly known as:  DELTASONE  TAKE ONE TABLET BY MOUTH ONE TIME DAILY WITH BREAKFAST     prochlorperazine 10 MG tablet  Commonly known as:  COMPAZINE  Take 1 tablet (10 mg total) by mouth every 6 (six) hours as needed (Nausea or vomiting).     senna 8.6 MG Tabs tablet  Commonly known as:  SENOKOT  Take 1 tablet (8.6 mg total) by mouth daily as needed for mild constipation.     temazepam 15 MG capsule  Commonly known as:  RESTORIL  TAKE 1 CAPSULE BY MOUTH NIGHTLY AT BEDTIME AS NEEDED FOR SLEEP     traMADol 50 MG tablet  Commonly known as:  ULTRAM  Take 1 tablet (50 mg total) by mouth every 6 (six) hours as needed for severe pain.  Allergies: No Known Allergies  Past Medical History, Surgical history, Social history, and Family History were reviewed and updated.  Review of Systems: All other 10 point review of systems is negative.   Physical Exam:  height is _0  (1.727 m) and weight is 197 lb (89.359 kg). His oral temperature is 97.8 F (36.6 C). His blood pressure is 145/79 and his pulse is 64. His respiration is 16.   Wt Readings from Last 3 Encounters:  02/22/15 197 lb (89.359 kg)  02/08/15 195 lb (88.451 kg)  01/24/15 193 lb (87.544 kg)    Ocular: Sclerae unicteric, pupils equal, round and reactive to light Ear-nose-throat: Oropharynx clear, dentition fair Lymphatic: No cervical or supraclavicular adenopathy Lungs no rales or rhonchi, good excursion bilaterally Heart regular rate and rhythm, no murmur appreciated Abd soft, nontender, positive bowel sounds MSK no focal spinal tenderness, no joint edema Neuro: non-focal, well-oriented, appropriate affect  Lab Results  Component  Value Date   WBC 10.0 02/22/2015   HGB 12.7* 02/22/2015   HCT 38.8 02/22/2015   MCV 109* 02/22/2015   PLT 188 02/22/2015   Lab Results  Component Value Date   FERRITIN 189 09/22/2014   IRON 124 09/22/2014   TIBC 236 09/22/2014   UIBC 112* 09/22/2014   IRONPCTSAT 52 09/22/2014   Lab Results  Component Value Date   RBC 3.56* 02/22/2015   No results found for: KPAFRELGTCHN, LAMBDASER, KAPLAMBRATIO No results found for: IGGSERUM, IGA, IGMSERUM No results found for: Odetta Pink, SPEI   Chemistry      Component Value Date/Time   NA 140 02/08/2015 0856   NA 136 09/01/2014 0400   K 3.6 02/08/2015 0856   K 3.6 09/01/2014 0400   CL 103 02/08/2015 0856   CL 105 09/01/2014 0400   CO2 27 02/08/2015 0856   CO2 24 09/01/2014 0400   BUN 17 02/08/2015 0856   BUN 9 09/01/2014 0400   CREATININE 0.8 02/08/2015 0856   CREATININE 0.54 09/01/2014 0400      Component Value Date/Time   CALCIUM 9.2 02/08/2015 0856   CALCIUM 8.4 09/01/2014 0400   ALKPHOS 66 02/08/2015 0856   ALKPHOS 60 09/01/2014 0400   AST 45* 02/08/2015 0856   AST 23 09/01/2014 0400   ALT 73* 02/08/2015 0856   ALT 13 09/01/2014 0400   BILITOT 0.60 02/08/2015 0856   BILITOT 1.1 09/01/2014 0400     Impression and Plan: Donald Fry is a 59 year old gentleman with metastatic colorectal cancer with extensive peritoneal disease. He is here today for his final chemo treatment.  We will proceed with cycle 12 today as planned. He is doing well at this time and has no complaints.  We will repeat CT scans on him in 1 month.  We will plan to see him back in 6 weeks for labs and follow-up. Dr. Marin Olp will discuss starting him on Xeloda and possibly Avastin at that time.  Both he and his wife know to call us with any questions or concerns. We can certainly see him sooner if need be.   [name], NP 9/20/20168:44 AM

## 2015-02-22 NOTE — Telephone Encounter (Signed)
Called patient's home #. Talked to patient's wife. Informed her of patient's upcoming appts.

## 2015-02-24 ENCOUNTER — Ambulatory Visit (HOSPITAL_BASED_OUTPATIENT_CLINIC_OR_DEPARTMENT_OTHER): Payer: Medicaid Other

## 2015-02-24 ENCOUNTER — Other Ambulatory Visit: Payer: Self-pay | Admitting: *Deleted

## 2015-02-24 VITALS — BP 131/81 | HR 65 | Temp 97.7°F | Resp 18

## 2015-02-24 DIAGNOSIS — C189 Malignant neoplasm of colon, unspecified: Secondary | ICD-10-CM | POA: Diagnosis not present

## 2015-02-24 MED ORDER — SODIUM CHLORIDE 0.9 % IJ SOLN
10.0000 mL | INTRAMUSCULAR | Status: DC | PRN
  Administered 2015-02-24: 10 mL
  Filled 2015-02-24: qty 10

## 2015-02-24 MED ORDER — ALPRAZOLAM 1 MG PO TABS: 1.0000 mg | ORAL_TABLET | Freq: Four times a day (QID) | ORAL | Status: DC | PRN

## 2015-02-24 MED ORDER — HEPARIN SOD (PORK) LOCK FLUSH 100 UNIT/ML IV SOLN
500.0000 [IU] | Freq: Once | INTRAVENOUS | Status: AC | PRN
  Administered 2015-02-24: 500 [IU]
  Filled 2015-02-24: qty 5

## 2015-02-24 NOTE — Patient Instructions (Signed)
Implanted Port Insertion, Care After Refer to this sheet in the next few weeks. These instructions provide you with information on caring for yourself after your procedure. Your health care provider may also give you more specific instructions. Your treatment has been planned according to current medical practices, but problems sometimes occur. Call your health care provider if you have any problems or questions after your procedure. WHAT TO EXPECT AFTER THE PROCEDURE After your procedure, it is typical to have the following:   Discomfort at the port insertion site. Ice packs to the area will help.  Bruising on the skin over the port. This will subside in 3-4 days. HOME CARE INSTRUCTIONS  After your port is placed, you will get a manufacturer's information card. The card has information about your port. Keep this card with you at all times.   Know what kind of port you have. There are many types of ports available.   Wear a medical alert bracelet in case of an emergency. This can help alert health care workers that you have a port.   The port can stay in for as long as your health care provider believes it is necessary.   A home health care nurse may give medicines and take care of the port.   You or a family member can get special training and directions for giving medicine and taking care of the port at home.  SEEK MEDICAL CARE IF:   Your port does not flush or you are unable to get a blood return.   You have a fever or chills. SEEK IMMEDIATE MEDICAL CARE IF:  You have new fluid or pus coming from your incision.   You notice a bad smell coming from your incision site.   You have swelling, pain, or more redness at the incision or port site.   You have chest pain or shortness of breath. Document Released: 03/11/2013 Document Revised: 05/26/2013 Document Reviewed: 03/11/2013 ExitCare Patient Information 2015 [employer]. This information is not intended to replace  advice given to you by your health care provider. Make sure you discuss any questions you have with your health care provider.  

## 2015-03-03 ENCOUNTER — Encounter: Payer: Self-pay | Admitting: Hematology & Oncology

## 2015-03-21 ENCOUNTER — Other Ambulatory Visit: Payer: Self-pay | Admitting: *Deleted

## 2015-03-21 ENCOUNTER — Encounter: Payer: Self-pay | Admitting: Family

## 2015-03-21 ENCOUNTER — Ambulatory Visit (HOSPITAL_BASED_OUTPATIENT_CLINIC_OR_DEPARTMENT_OTHER)
Admission: RE | Admit: 2015-03-21 | Discharge: 2015-03-21 | Disposition: A | Discharge status: Home / Self Care | Payer: Medicaid Other | Source: Ambulatory Visit | Attending: Hematology & Oncology | Admitting: Hematology & Oncology

## 2015-03-21 ENCOUNTER — Ambulatory Visit (HOSPITAL_BASED_OUTPATIENT_CLINIC_OR_DEPARTMENT_OTHER): Payer: Medicaid Other | Admitting: Family

## 2015-03-21 ENCOUNTER — Telehealth: Payer: Self-pay | Admitting: *Deleted

## 2015-03-21 VITALS — BP 143/91 | HR 73 | Temp 98.5°F | Resp 16

## 2015-03-21 DIAGNOSIS — C189 Malignant neoplasm of colon, unspecified: Secondary | ICD-10-CM

## 2015-03-21 DIAGNOSIS — R1084 Generalized abdominal pain: Secondary | ICD-10-CM

## 2015-03-21 DIAGNOSIS — Z9221 Personal history of antineoplastic chemotherapy: Secondary | ICD-10-CM | POA: Diagnosis not present

## 2015-03-21 DIAGNOSIS — K2970 Gastritis, unspecified, without bleeding: Secondary | ICD-10-CM

## 2015-03-21 DIAGNOSIS — R109 Unspecified abdominal pain: Secondary | ICD-10-CM | POA: Insufficient documentation

## 2015-03-21 MED ORDER — ALPRAZOLAM 1 MG PO TABS: 1.0000 mg | ORAL_TABLET | Freq: Four times a day (QID) | ORAL | Status: AC | PRN

## 2015-03-21 NOTE — Telephone Encounter (Signed)
Patient called stating that he has abdominal pain every time he eats.  He is having Left shoulder pain when he eats.  Dr. Marin Olp ordered plain abdominal films and patient is to have CT scan on Thursday.  Patient will come in today for scan.

## 2015-03-21 NOTE — Progress Notes (Signed)
Hematology and Oncology Follow Up Visit  Donald Fry [id number] [birth year] 59 y.o. 03/21/2015   Principle Diagnosis:  Metastatic colon cancer- KRAS wild type  Current Therapy:   Completed 12 cycle of FOLFOXIRI on 02/22/15     Interim History:  Donald Fry is here today with c/o pain in both his right and left flank. He states that he has regular BMs and some flatus before. After he eats he gets bloated and has some left shoulder pain. The shoulder pain is relieved with heat.  His abdomen is distended and firm it is painful in the right and left lower quadrants with palpation.  He did have an abdominal xray today which showed no acute findings.  He has kept a good appetite despite the discomfort. He is also staying hydrated. His weight is up 2 lbs since his last visit.  He denies fever, chills, n/v, cough, rash, dizziness, chest pain, palpitations or changes in bowel of bladder habits. He has not noticed any blood in his stool.  No lymphadenopathy found on exam.  No swelling, tenderness, numbness or tingling in his extremities. His CEA earlier this month was 1.2.   Medications:    Medication List       This list is accurate as of: 03/21/15  2:42 PM.  Always use your most recent med list.               ALPRAZolam 1 MG tablet  Commonly known as:  XANAX  Take 1 tablet (1 mg total) by mouth 4 (four) times daily as needed for anxiety.     aspirin 81 MG tablet  Take 81 mg by mouth daily.     atenolol 100 MG tablet  Commonly known as:  TENORMIN  Take 100 mg by mouth daily.     eucerin cream  Apply 1 application topically daily. Applied to hands and feet     lidocaine-prilocaine cream  Commonly known as:  EMLA  Apply to affected area once     lovastatin 40 MG tablet  Commonly known as:  MEVACOR  Take 40 mg by mouth at bedtime.     mirtazapine 15 MG tablet  Commonly known as:  REMERON  Take 0.5 tablets (7.5 mg total) by mouth 2 (two) times daily.     multivitamin  tablet  Take 1 tablet by mouth daily.     ondansetron 8 MG tablet  Commonly known as:  ZOFRAN  Take 1 tablet (8 mg total) by mouth 2 (two) times daily. Start the day after chemo for 3 days, then as needed for n&v.     predniSONE 10 MG tablet  Commonly known as:  DELTASONE  TAKE ONE TABLET BY MOUTH ONE TIME DAILY WITH BREAKFAST     prochlorperazine 10 MG tablet  Commonly known as:  COMPAZINE  Take 1 tablet (10 mg total) by mouth every 6 (six) hours as needed (Nausea or vomiting).     senna 8.6 MG Tabs tablet  Commonly known as:  SENOKOT  Take 1 tablet (8.6 mg total) by mouth daily as needed for mild constipation.     temazepam 15 MG capsule  Commonly known as:  RESTORIL  TAKE 1 CAPSULE BY MOUTH NIGHTLY AT BEDTIME AS NEEDED FOR SLEEP     traMADol 50 MG tablet  Commonly known as:  ULTRAM  Take 1 tablet (50 mg total) by mouth every 6 (six) hours as needed for severe pain.        Allergies: No  Known Allergies  Past Medical History, Surgical history, Social history, and Family History were reviewed and updated.  Review of Systems: All other 10 point review of systems is negative.   Physical Exam:  vitals were not taken for this visit.  Wt Readings from Last 3 Encounters:  02/22/15 197 lb (89.359 kg)  02/08/15 195 lb (88.451 kg)  01/24/15 193 lb (87.544 kg)    Ocular: Sclerae unicteric, pupils equal, round and reactive to light Ear-nose-throat: Oropharynx clear, dentition fair Lymphatic: No cervical or supraclavicular adenopathy Lungs no rales or rhonchi, good excursion bilaterally Heart regular rate and rhythm, no murmur appreciated Abd distended and tender in his right and left flanks, positive bowel sounds MSK no focal spinal tenderness, no joint edema Neuro: non-focal, well-oriented, appropriate affect  Lab Results  Component Value Date   WBC 10.0 02/22/2015   HGB 12.7* 02/22/2015   HCT 38.8 02/22/2015   MCV 109* 02/22/2015   PLT 188 02/22/2015   Lab  Results  Component Value Date   FERRITIN 189 09/22/2014   IRON 124 09/22/2014   TIBC 236 09/22/2014   UIBC 112* 09/22/2014   IRONPCTSAT 52 09/22/2014   Lab Results  Component Value Date   RBC 3.56* 02/22/2015   No results found for: KPAFRELGTCHN, LAMBDASER, KAPLAMBRATIO No results found for: IGGSERUM, IGA, IGMSERUM No results found for: Odetta Pink, SPEI   Chemistry      Component Value Date/Time   NA 142 02/22/2015 0805   NA 136 09/01/2014 0400   K 3.9 02/22/2015 0805   K 3.6 09/01/2014 0400   CL 104 02/22/2015 0805   CL 105 09/01/2014 0400   CO2 29 02/22/2015 0805   CO2 24 09/01/2014 0400   BUN 15 02/22/2015 0805   BUN 9 09/01/2014 0400   CREATININE 0.9 02/22/2015 0805   CREATININE 0.54 09/01/2014 0400      Component Value Date/Time   CALCIUM 8.9 02/22/2015 0805   CALCIUM 8.4 09/01/2014 0400   ALKPHOS 73 02/22/2015 0805   ALKPHOS 60 09/01/2014 0400   AST 37 02/22/2015 0805   AST 23 09/01/2014 0400   ALT 68* 02/22/2015 0805   ALT 13 09/01/2014 0400   BILITOT 0.60 02/22/2015 0805   BILITOT 1.1 09/01/2014 0400     Impression and Plan: Donald Fry is a 59 year old gentleman with metastatic colorectal cancer with extensive peritoneal disease. He is here today with c/o pain in his right and left flank. His abdomen is bloated and tender on palpation. He also has pain in his right shoulder after eating.  He had an xray this afternoon which was negative. We moved his CT scan up to tomorrow morning at 8 am to assess for changes. He has his contrast.  He will also try taking Pepto bismol tonight to help eliminate any bloating.  We will follow-up with him after receiving his scan results.     Both he and his wife know to call us with any questions or concerns. We can certainly see him sooner if need be.   [name], NP 10/17/20162:42 PM

## 2015-03-22 ENCOUNTER — Encounter (HOSPITAL_BASED_OUTPATIENT_CLINIC_OR_DEPARTMENT_OTHER): Payer: Self-pay

## 2015-03-22 ENCOUNTER — Telehealth: Payer: Self-pay

## 2015-03-22 ENCOUNTER — Ambulatory Visit (HOSPITAL_BASED_OUTPATIENT_CLINIC_OR_DEPARTMENT_OTHER)
Admission: RE | Admit: 2015-03-22 | Discharge: 2015-03-22 | Disposition: A | Discharge status: Home / Self Care | Payer: Medicaid Other | Source: Ambulatory Visit | Attending: Family | Admitting: Family

## 2015-03-22 ENCOUNTER — Ambulatory Visit (HOSPITAL_BASED_OUTPATIENT_CLINIC_OR_DEPARTMENT_OTHER): Payer: Medicaid Other

## 2015-03-22 ENCOUNTER — Ambulatory Visit (AMBULATORY_SURGERY_CENTER): Payer: Self-pay

## 2015-03-22 ENCOUNTER — Telehealth: Payer: Self-pay | Admitting: Family

## 2015-03-22 ENCOUNTER — Other Ambulatory Visit: Payer: Self-pay | Admitting: Family

## 2015-03-22 VITALS — Ht 69.0 in | Wt 198.0 lb

## 2015-03-22 DIAGNOSIS — Z452 Encounter for adjustment and management of vascular access device: Secondary | ICD-10-CM | POA: Diagnosis present

## 2015-03-22 DIAGNOSIS — C189 Malignant neoplasm of colon, unspecified: Secondary | ICD-10-CM

## 2015-03-22 DIAGNOSIS — R188 Other ascites: Secondary | ICD-10-CM | POA: Insufficient documentation

## 2015-03-22 DIAGNOSIS — K760 Fatty (change of) liver, not elsewhere classified: Secondary | ICD-10-CM | POA: Diagnosis not present

## 2015-03-22 DIAGNOSIS — J90 Pleural effusion, not elsewhere classified: Secondary | ICD-10-CM | POA: Insufficient documentation

## 2015-03-22 DIAGNOSIS — K769 Liver disease, unspecified: Secondary | ICD-10-CM | POA: Diagnosis not present

## 2015-03-22 DIAGNOSIS — R933 Abnormal findings on diagnostic imaging of other parts of digestive tract: Secondary | ICD-10-CM

## 2015-03-22 DIAGNOSIS — R918 Other nonspecific abnormal finding of lung field: Secondary | ICD-10-CM | POA: Insufficient documentation

## 2015-03-22 DIAGNOSIS — R590 Localized enlarged lymph nodes: Secondary | ICD-10-CM | POA: Diagnosis not present

## 2015-03-22 MED ORDER — IOHEXOL 300 MG/ML  SOLN
100.0000 mL | Freq: Once | INTRAMUSCULAR | Status: AC | PRN
  Administered 2015-03-22: 100 mL via INTRAVENOUS

## 2015-03-22 MED ORDER — SODIUM CHLORIDE 0.9 % IJ SOLN
10.0000 mL | INTRAMUSCULAR | Status: DC | PRN
  Administered 2015-03-22: 10 mL via INTRAVENOUS
  Filled 2015-03-22: qty 10

## 2015-03-22 MED ORDER — PANTOPRAZOLE SODIUM 40 MG PO TBEC: 40.0000 mg | DELAYED_RELEASE_TABLET | Freq: Two times a day (BID) | ORAL | Status: AC

## 2015-03-22 MED ORDER — HEPARIN SOD (PORK) LOCK FLUSH 100 UNIT/ML IV SOLN
500.0000 [IU] | Freq: Once | INTRAVENOUS | Status: AC
  Administered 2015-03-22: 500 [IU] via INTRAVENOUS
  Filled 2015-03-22: qty 5

## 2015-03-22 NOTE — Progress Notes (Signed)
No allergies to eggs or soy No diet/weight loss meds No home oxgyen No past problems with anesthesia  Refused emmi

## 2015-03-22 NOTE — Patient Instructions (Signed)

## 2015-03-22 NOTE — Telephone Encounter (Signed)
-----   Message from Ladene Artist, MD sent at 03/22/2015 10:32 AM EDT ----- Dr. Marin Olp called regarding abnormal gastric findings on CT. Pt needs an EGD. OK for direct schedule EGD from my standpoint. Thx.

## 2015-03-22 NOTE — Telephone Encounter (Signed)
I spoke with Donald Fry and his wife over the phone and discussed his CT scan results from this morning. We have referred him back to Dr. Fuller Plan for an upper endoscopy to further evaluate his gastritis and small antral ulcer tomorrow. We will also start him on Protonix 40 mg BID today. Prescription was sent to his pharmacy. All questions were answered. They know to call with any other concerns.

## 2015-03-22 NOTE — Telephone Encounter (Signed)
Patient is contacted and will come for pre-visit today at 3:30 and EGD tomorrow at 1:30 with Dr. Fuller Plan

## 2015-03-23 ENCOUNTER — Encounter: Payer: Self-pay | Admitting: Gastroenterology

## 2015-03-23 ENCOUNTER — Ambulatory Visit (AMBULATORY_SURGERY_CENTER): Payer: Medicaid Other | Admitting: Gastroenterology

## 2015-03-23 VITALS — BP 132/77 | HR 70 | Temp 96.7°F | Resp 20 | Ht 69.0 in | Wt 198.0 lb

## 2015-03-23 DIAGNOSIS — K2970 Gastritis, unspecified, without bleeding: Secondary | ICD-10-CM

## 2015-03-23 DIAGNOSIS — K2950 Unspecified chronic gastritis without bleeding: Secondary | ICD-10-CM | POA: Diagnosis not present

## 2015-03-23 DIAGNOSIS — K2990 Gastroduodenitis, unspecified, without bleeding: Secondary | ICD-10-CM | POA: Diagnosis not present

## 2015-03-23 DIAGNOSIS — R933 Abnormal findings on diagnostic imaging of other parts of digestive tract: Secondary | ICD-10-CM | POA: Diagnosis not present

## 2015-03-23 MED ORDER — SODIUM CHLORIDE 0.9 % IV SOLN: 500.0000 mL | INTRAVENOUS | Status: DC

## 2015-03-23 NOTE — Progress Notes (Signed)
Pt. Expressed to Dr. Fuller Plan about the abdominal pain that he has been having,Dr. Fuller Plan instructed to pt. That he need to take his ppi twice a day for about a month and he feels that the pain would get better and if it does not they will look at trying something different.

## 2015-03-23 NOTE — Op Note (Signed)
[facility]  [address] [place], [id number]   ENDOSCOPY PROCEDURE REPORT  PATIENT: Donald Fry, Donald Fry  MR#: [id number] BIRTHDATE: 25-May-1956 , 59  yrs. old GENDER: male ENDOSCOPIST: Ladene Artist, MD, [facility] REFERRED BY:  Burney Gauze, M.D. PROCEDURE DATE:  03/23/2015 PROCEDURE:  EGD w/ biopsy ASA CLASS:     Class III INDICATIONS:  abnormal CT of the GI tract. MEDICATIONS: Monitored anesthesia care and Propofol 150 mg IV TOPICAL ANESTHETIC: none DESCRIPTION OF PROCEDURE: After the risks benefits and alternatives of the procedure were thoroughly explained, informed consent was obtained.  The LB [id number] endoscope was introduced through the mouth and advanced to the second portion of the duodenum , Without limitations.  The instrument was slowly withdrawn as the mucosa was fully examined.  STOMACH: Gastritis, nonerosive, was found in the gastric body and gastric antrum.  Multiple biopsies were performed.   Thickened folds in the antrum and distal gastric body along the posterior wall and greater curvature.  Multiple biopsies obtained.   The stomach otherwise appeared normal. ESOPHAGUS: There was LA Class B esophagitis (One or more mucosal breaks > 70mm, but without continuity across mucosal folds) noted. The esophagus otherwise appeared normal. DUODENUM: Moderate nonerosive duodenal inflammation was found in the duodenal bulb.   The duodenal mucosa showed no abnormalities in the 2nd part of the duodenum.  Retroflexed views revealed a hiatal hernia.     The scope was then withdrawn from the patient and the procedure completed.  COMPLICATIONS: There were no immediate complications.  ENDOSCOPIC IMPRESSION: 1.   Gastritis in the gastric body and gastric antrum; multiple biopsies performed 2.   Thickened folds in the antrum and distal gastric body; multiple biopsies obtained 3.   Small hiatal hernia 4.   LA Class B esophagitis  RECOMMENDATIONS: 1.   Anti-reflux regimen 2.  Await pathology results 3.  Continue PPI bid   eSigned:  Ladene Artist, MD, [facility] 03/23/2015 2:02 PM

## 2015-03-23 NOTE — Patient Instructions (Signed)
YOU HAD AN ENDOSCOPIC PROCEDURE TODAY AT [facility]:   Refer to the procedure report that was given to you for any specific questions about what was found during the examination.  If the procedure report does not answer your questions, please call your gastroenterologist to clarify.  If you requested that your care partner not be given the details of your procedure findings, then the procedure report has been included in a sealed envelope for you to review at your convenience later.  YOU SHOULD EXPECT: Some feelings of bloating in the abdomen. Passage of more gas than usual.  Walking can help get rid of the air that was put into your GI tract during the procedure and reduce the bloating. If you had a lower endoscopy (such as a colonoscopy or flexible sigmoidoscopy) you may notice spotting of blood in your stool or on the toilet paper. If you underwent a bowel prep for your procedure, you may not have a normal bowel movement for a few days.  Please Note:  You might notice some irritation and congestion in your nose or some drainage.  This is from the oxygen used during your procedure.  There is no need for concern and it should clear up in a day or so.  SYMPTOMS TO REPORT IMMEDIATELY:    Following upper endoscopy (EGD)  Vomiting of blood or coffee ground material  New chest pain or pain under the shoulder blades  Painful or persistently difficult swallowing  New shortness of breath  Fever of 100F or higher  Black, tarry-looking stools  For urgent or emergent issues, a gastroenterologist can be reached at any hour by calling [phone].   DIET: Your first meal following the procedure should be a small meal and then it is ok to progress to your normal diet. Heavy or fried foods are harder to digest and may make you feel nauseous or bloated.  Likewise, meals heavy in dairy and vegetables can increase bloating.  Drink plenty of fluids but you should avoid alcoholic beverages  for 24 hours.  ACTIVITY:  You should plan to take it easy for the rest of today and you should NOT DRIVE or use heavy machinery until tomorrow (because of the sedation medicines used during the test).    FOLLOW UP: Our staff will call the number listed on your records the next business day following your procedure to check on you and address any questions or concerns that you may have regarding the information given to you following your procedure. If we do not reach you, we will leave a message.  However, if you are feeling well and you are not experiencing any problems, there is no need to return our call.  We will assume that you have returned to your regular daily activities without incident.  If any biopsies were taken you will be contacted by phone or by letter within the next 1-3 weeks.  Please call us at [phone] if you have not heard about the biopsies in 3 weeks.    SIGNATURES/CONFIDENTIALITY: You and/or your care partner have signed paperwork which will be entered into your electronic medical record.  These signatures attest to the fact that that the information above on your After Visit Summary has been reviewed and is understood.  Full responsibility of the confidentiality of this discharge information lies with you and/or your care-partner.   Resume medications. Information given on Gastritis,esohagitis and hiatal hernia.

## 2015-03-23 NOTE — Progress Notes (Signed)
Called to room to assist during endoscopic procedure.  Patient ID and intended procedure confirmed with present staff. Received instructions for my participation in the procedure from the performing physician.  

## 2015-03-23 NOTE — Progress Notes (Signed)
Report to PACU, RN, vss, BBS= Clear.  

## 2015-03-24 ENCOUNTER — Inpatient Hospital Stay (HOSPITAL_BASED_OUTPATIENT_CLINIC_OR_DEPARTMENT_OTHER): Admission: RE | Admit: 2015-03-24 | Payer: Medicaid Other | Source: Ambulatory Visit

## 2015-03-24 ENCOUNTER — Telehealth: Payer: Self-pay

## 2015-03-24 ENCOUNTER — Other Ambulatory Visit (HOSPITAL_BASED_OUTPATIENT_CLINIC_OR_DEPARTMENT_OTHER): Payer: Medicaid Other

## 2015-03-24 ENCOUNTER — Other Ambulatory Visit: Payer: Self-pay | Admitting: Nurse Practitioner

## 2015-03-24 NOTE — Telephone Encounter (Signed)
  Follow up Call-  Call back number 03/23/2015  Post procedure Call Back phone  # [phone]  Permission to leave phone message Yes     Patient questions:  Do you have a fever, pain , or abdominal swelling? No. Pain Score  0 *  Have you tolerated food without any problems? Yes.    Have you been able to return to your normal activities? Yes.    Do you have any questions about your discharge instructions: Diet   No. Medications  No. Follow up visit  No.  Do you have questions or concerns about your Care? Yes.    Actions: * If pain score is 4 or above: No action needed, pain <4.   Pt said he was bloated feeling after eating grapes.  He said he also felt weak yesterday.  I advised him to call us back if any questions or if he does not improve. maw

## 2015-03-25 ENCOUNTER — Other Ambulatory Visit: Payer: Self-pay | Admitting: Hematology & Oncology

## 2015-03-25 ENCOUNTER — Other Ambulatory Visit: Payer: Self-pay

## 2015-03-25 DIAGNOSIS — C189 Malignant neoplasm of colon, unspecified: Secondary | ICD-10-CM

## 2015-03-25 MED ORDER — CAPECITABINE 500 MG PO TABS: ORAL_TABLET | ORAL | Status: DC

## 2015-03-28 ENCOUNTER — Ambulatory Visit (HOSPITAL_BASED_OUTPATIENT_CLINIC_OR_DEPARTMENT_OTHER): Payer: Medicaid Other | Admitting: Family

## 2015-03-28 ENCOUNTER — Ambulatory Visit (HOSPITAL_BASED_OUTPATIENT_CLINIC_OR_DEPARTMENT_OTHER): Payer: Medicaid Other

## 2015-03-28 ENCOUNTER — Other Ambulatory Visit (HOSPITAL_BASED_OUTPATIENT_CLINIC_OR_DEPARTMENT_OTHER): Payer: Medicaid Other

## 2015-03-28 ENCOUNTER — Encounter: Payer: Self-pay | Admitting: Family

## 2015-03-28 VITALS — BP 150/100 | HR 72 | Temp 97.9°F | Resp 14 | Ht 69.0 in | Wt 196.0 lb

## 2015-03-28 DIAGNOSIS — C189 Malignant neoplasm of colon, unspecified: Secondary | ICD-10-CM

## 2015-03-28 DIAGNOSIS — K2950 Unspecified chronic gastritis without bleeding: Secondary | ICD-10-CM | POA: Diagnosis not present

## 2015-03-28 DIAGNOSIS — C183 Malignant neoplasm of hepatic flexure: Secondary | ICD-10-CM

## 2015-03-28 DIAGNOSIS — C786 Secondary malignant neoplasm of retroperitoneum and peritoneum: Secondary | ICD-10-CM | POA: Diagnosis not present

## 2015-03-28 DIAGNOSIS — C184 Malignant neoplasm of transverse colon: Secondary | ICD-10-CM

## 2015-03-28 DIAGNOSIS — K2970 Gastritis, unspecified, without bleeding: Secondary | ICD-10-CM

## 2015-03-28 LAB — COMPREHENSIVE METABOLIC PANEL (CC13)
ALBUMIN: 3 g/dL — AB (ref 3.5–5.0)
ALK PHOS: 168 U/L — AB (ref 40–150)
ALT: 70 U/L — AB (ref 0–55)
AST: 45 U/L — AB (ref 5–34)
Anion Gap: 8 mEq/L (ref 3–11)
BUN: 13.6 mg/dL (ref 7.0–26.0)
CALCIUM: 9.7 mg/dL (ref 8.4–10.4)
CO2: 27 mEq/L (ref 22–29)
CREATININE: 0.8 mg/dL (ref 0.7–1.3)
Chloride: 106 mEq/L (ref 98–109)
EGFR: >90 mL/min/{1.73_m2} (ref >90)
Glucose: 141 mg/dl — ABNORMAL HIGH (ref 70–140)
POTASSIUM: 4 meq/L (ref 3.5–5.1)
Sodium: 141 mEq/L (ref 136–145)
Total Bilirubin: 0.55 mg/dL (ref 0.20–1.20)
Total Protein: 7.2 g/dL (ref 6.4–8.3)

## 2015-03-28 LAB — CBC WITH DIFFERENTIAL (CANCER CENTER ONLY)
BASO#: 0.1 10*3/uL (ref 0.0–0.2)
BASO%: 0.5 % (ref 0.0–2.0)
EOS%: 2.6 % (ref 0.0–7.0)
Eosinophils Absolute: 0.3 10*3/uL (ref 0.0–0.5)
HEMATOCRIT: 40.2 % (ref 38.7–49.9)
HEMOGLOBIN: 12.9 g/dL — AB (ref 13.0–17.1)
LYMPH#: 0.6 10*3/uL — AB (ref 0.9–3.3)
LYMPH%: 4.7 % — ABNORMAL LOW (ref 14.0–48.0)
MCH: 33.7 pg — ABNORMAL HIGH (ref 28.0–33.4)
MCHC: 32.1 g/dL (ref 32.0–35.9)
MCV: 105 fL — AB (ref 82–98)
MONO#: 1 10*3/uL — AB (ref 0.1–0.9)
MONO%: 8.1 % (ref 0.0–13.0)
NEUT%: 84.1 % — ABNORMAL HIGH (ref 40.0–80.0)
NEUTROS ABS: 10.1 10*3/uL — AB (ref 1.5–6.5)
Platelets: 335 10*3/uL (ref 145–400)
RBC: 3.83 10*6/uL — ABNORMAL LOW (ref 4.20–5.70)
RDW: 13.7 % (ref 11.1–15.7)
WBC: 12 10*3/uL — ABNORMAL HIGH (ref 4.0–10.0)

## 2015-03-28 MED ORDER — SUCRALFATE 1 GM/10ML PO SUSP: 1.0000 g | Freq: Three times a day (TID) | ORAL | Status: AC

## 2015-03-28 MED ORDER — SODIUM CHLORIDE 0.9 % IJ SOLN
10.0000 mL | INTRAMUSCULAR | Status: DC | PRN
  Administered 2015-03-28: 10 mL via INTRAVENOUS
  Filled 2015-03-28: qty 10

## 2015-03-28 MED ORDER — HEPARIN SOD (PORK) LOCK FLUSH 100 UNIT/ML IV SOLN
500.0000 [IU] | Freq: Once | INTRAVENOUS | Status: AC
  Administered 2015-03-28: 500 [IU] via INTRAVENOUS
  Filled 2015-03-28: qty 5

## 2015-03-28 MED ORDER — HYDROCODONE-ACETAMINOPHEN 5-325 MG PO TABS: 1.0000 | ORAL_TABLET | ORAL | Status: DC | PRN

## 2015-03-28 NOTE — Patient Instructions (Signed)

## 2015-03-28 NOTE — Progress Notes (Signed)
Hematology and Oncology Follow Up Visit  Donald Fry [id number] 09/29/1955 59 y.o. 03/28/2015   Principle Diagnosis:  Metastatic colon cancer- KRAS wild type  Current Therapy:   Completed 12 cycle of FOLFOXIRI on 02/22/15  Xeloda 2, 000 mg BID on 14 off 7 - was going to start today but patient wishes to hold off until gastritis resolves.     Interim History:  Donald Fry is here today with c/o abdominal pain despite taking Protonix 40 mg BID. He has not been eating well because he is afraid it will make his abdomen hurt worse. He is drinking fluids and denies feeling dehydrated at this time.  He is still slightly distended and tender on palpation to the left of the umbilicus.  He is having normal BMs and flatus. No blood in his stool.  He had an endoscopy last week by Donald Fry. His pathology report showed chronic gastritis. No H. Pylori or evidence of malignancy.  Because of the pain he is not sleeping well despite taking Restoril.   He was to start Xeloda today but would like to hold off until his gastritis resolves.  He denies fever, chills, n/v, cough, rash, dizziness, chest pain, palpitations or changes in bowel of bladder habits. No lymphadenopathy found on exam.  His CEA in September was 1.2. We did recheck this today.   Medications:    Medication List       This list is accurate as of: 03/28/15 10:19 AM.  Always use your most recent med list.               ALPRAZolam 1 MG tablet  Commonly known as:  XANAX  Take 1 tablet (1 mg total) by mouth 4 (four) times daily as needed for anxiety.     aspirin 81 MG tablet  Take 81 mg by mouth daily.     atenolol 100 MG tablet  Commonly known as:  TENORMIN  Take 100 mg by mouth daily.     capecitabine 500 MG tablet  Commonly known as:  XELODA  Take 4 pills TWICE a day for 14 days with food.     eucerin cream  Apply 1 application topically daily. Applied to hands and feet     lidocaine-prilocaine cream  Commonly known  as:  EMLA  Apply to affected area once     lovastatin 40 MG tablet  Commonly known as:  MEVACOR  Take 40 mg by mouth at bedtime.     mirtazapine 15 MG tablet  Commonly known as:  REMERON  Take 0.5 tablets (7.5 mg total) by mouth 2 (two) times daily.     multivitamin tablet  Take 1 tablet by mouth daily.     ondansetron 8 MG tablet  Commonly known as:  ZOFRAN  Take 1 tablet (8 mg total) by mouth 2 (two) times daily. Start the day after chemo for 3 days, then as needed for n&v.     pantoprazole 40 MG tablet  Commonly known as:  PROTONIX  Take 1 tablet (40 mg total) by mouth 2 (two) times daily.     predniSONE 10 MG tablet  Commonly known as:  DELTASONE  TAKE ONE TABLET BY MOUTH ONE TIME DAILY WITH BREAKFAST     prochlorperazine 10 MG tablet  Commonly known as:  COMPAZINE  Take 1 tablet (10 mg total) by mouth every 6 (six) hours as needed (Nausea or vomiting).     senna 8.6 MG Tabs tablet  Commonly known  asDonavan Burnet  Take 1 tablet (8.6 mg total) by mouth daily as needed for mild constipation.     temazepam 15 MG capsule  Commonly known as:  RESTORIL  TAKE 1 CAPSULE BY MOUTH NIGHTLY AT BEDTIME AS NEEDED FOR SLEEP     traMADol 50 MG tablet  Commonly known as:  ULTRAM  Take 1 tablet (50 mg total) by mouth every 6 (six) hours as needed for severe pain.        Allergies: No Known Allergies  Past Medical History, Surgical history, Social history, and Family History were reviewed and updated.  Review of Systems: All other 10 point review of systems is negative.   Physical Exam:  vitals were not taken for this visit.  Wt Readings from Last 3 Encounters:  03/23/15 198 lb (89.812 kg)  03/22/15 198 lb (89.812 kg)  02/22/15 197 lb (89.359 kg)    Ocular: Sclerae unicteric, pupils equal, round and reactive to light Ear-nose-throat: Oropharynx clear, dentition fair Lymphatic: No cervical or supraclavicular adenopathy Lungs no rales or rhonchi, good excursion  bilaterally Heart regular rate and rhythm, no murmur appreciated Abd mildly distended with pain to the left of the umbilicus, tender on palpation. Positive bowel sounds.  MSK no focal spinal tenderness, no joint edema Neuro: non-focal, well-oriented, appropriate affect  Lab Results  Component Value Date   WBC 10.0 02/22/2015   HGB 12.7* 02/22/2015   HCT 38.8 02/22/2015   MCV 109* 02/22/2015   PLT 188 02/22/2015   Lab Results  Component Value Date   FERRITIN 189 09/22/2014   IRON 124 09/22/2014   TIBC 236 09/22/2014   UIBC 112* 09/22/2014   IRONPCTSAT 52 09/22/2014   Lab Results  Component Value Date   RBC 3.56* 02/22/2015   No results found for: KPAFRELGTCHN, LAMBDASER, KAPLAMBRATIO No results found for: IGGSERUM, IGA, IGMSERUM No results found for: Odetta Pink, SPEI   Chemistry      Component Value Date/Time   NA 142 02/22/2015 0805   NA 136 09/01/2014 0400   K 3.9 02/22/2015 0805   K 3.6 09/01/2014 0400   CL 104 02/22/2015 0805   CL 105 09/01/2014 0400   CO2 29 02/22/2015 0805   CO2 24 09/01/2014 0400   BUN 15 02/22/2015 0805   BUN 9 09/01/2014 0400   CREATININE 0.9 02/22/2015 0805   CREATININE 0.54 09/01/2014 0400      Component Value Date/Time   CALCIUM 8.9 02/22/2015 0805   CALCIUM 8.4 09/01/2014 0400   ALKPHOS 73 02/22/2015 0805   ALKPHOS 60 09/01/2014 0400   AST 37 02/22/2015 0805   AST 23 09/01/2014 0400   ALT 68* 02/22/2015 0805   ALT 13 09/01/2014 0400   BILITOT 0.60 02/22/2015 0805   BILITOT 1.1 09/01/2014 0400     Impression and Fry: Donald Fry is a 59 year old gentleman with metastatic colorectal cancer with extensive peritoneal disease. He is still having pain in th abdomen at the left of the umbilicus despite being on Protonix 40 mg BID. Endoscopy last week showed chronic gastritis.  I sent a prescription for Carafate QID to his pharmacy as well as a prescription for Hydrocodone for pain.  He can also take Pepcid if needed.  Hopefully once we get his pain under control he will be able to rest.  We will hold off on starting him on Xeloda for a few days per his request until his gastritis symptoms improve. He will go ahead  and fill has prescription.  We will Fry to see him back in 3 weeks for labs and follow-up.    Both he and his wife know to call us with any questions or concerns. We can certainly see him sooner if need be.   [name], NP 10/24/201610:19 AM

## 2015-03-29 LAB — CEA: CEA: 0.7 ng/mL (ref 0.0–5.0)

## 2015-03-30 ENCOUNTER — Telehealth: Payer: Self-pay | Admitting: Gastroenterology

## 2015-03-30 ENCOUNTER — Encounter: Payer: Self-pay | Admitting: Gastroenterology

## 2015-03-30 ENCOUNTER — Telehealth: Payer: Self-pay | Admitting: *Deleted

## 2015-03-30 NOTE — Telephone Encounter (Signed)
Patients wife notified

## 2015-03-30 NOTE — Telephone Encounter (Signed)
Patient continues to have problems with pain. He is unable to lie down due to pain, so is sleeping in a recliner. Sleep is minimal and poor quality. He is eating poorly and feels like he feels full after just one or two bites. His appointment with GI isn't until November 17th and wife states that they have had communication with GI doctor who, at this time, doesn't feel the need to move up his appointment.   Spoke to Donald Fry who advises that the patient call the GI doc office and inform them of worsening symptoms. They may be able to work patient in sooner than November 17th. Also, if patient continues to decline, and symptoms worsen or patient feels it necessary, he is to go to the emergency room. Patient and wife aware of instructions and they are in agreement.

## 2015-03-30 NOTE — Telephone Encounter (Signed)
Pain is related to his metastatic colon cancer. He can adjust to a soft or liquid diet or take his current pain meds 30-60 minutes before meals. Other advice per oncologist.

## 2015-03-30 NOTE — Telephone Encounter (Signed)
Patient's wife reports that the patient is not able to eat more than a bite or two.  His oncologist has prescribed hydrocodone and carafate for the pain, but this is not helping. He reports that anytime he eats he has terrible pain. The only time he does not have pain is when he has an empty stomach.  He is also on pantoprazole BID.  Dr. Fuller Plan please advise

## 2015-03-31 ENCOUNTER — Telehealth: Payer: Self-pay | Admitting: *Deleted

## 2015-03-31 ENCOUNTER — Encounter (HOSPITAL_BASED_OUTPATIENT_CLINIC_OR_DEPARTMENT_OTHER): Payer: Self-pay | Admitting: Emergency Medicine

## 2015-03-31 ENCOUNTER — Emergency Department (HOSPITAL_BASED_OUTPATIENT_CLINIC_OR_DEPARTMENT_OTHER): Payer: Medicaid Other

## 2015-03-31 ENCOUNTER — Emergency Department (HOSPITAL_BASED_OUTPATIENT_CLINIC_OR_DEPARTMENT_OTHER)
Admission: EM | Admit: 2015-03-31 | Discharge: 2015-03-31 | Disposition: A | Discharge status: Home / Self Care | Payer: Medicaid Other | Attending: Emergency Medicine | Admitting: Emergency Medicine

## 2015-03-31 DIAGNOSIS — Z79899 Other long term (current) drug therapy: Secondary | ICD-10-CM | POA: Diagnosis not present

## 2015-03-31 DIAGNOSIS — Z7982 Long term (current) use of aspirin: Secondary | ICD-10-CM | POA: Diagnosis not present

## 2015-03-31 DIAGNOSIS — I10 Essential (primary) hypertension: Secondary | ICD-10-CM | POA: Insufficient documentation

## 2015-03-31 DIAGNOSIS — F419 Anxiety disorder, unspecified: Secondary | ICD-10-CM | POA: Insufficient documentation

## 2015-03-31 DIAGNOSIS — Z85038 Personal history of other malignant neoplasm of large intestine: Secondary | ICD-10-CM | POA: Diagnosis not present

## 2015-03-31 DIAGNOSIS — Z7952 Long term (current) use of systemic steroids: Secondary | ICD-10-CM | POA: Diagnosis not present

## 2015-03-31 DIAGNOSIS — Z87891 Personal history of nicotine dependence: Secondary | ICD-10-CM | POA: Diagnosis not present

## 2015-03-31 DIAGNOSIS — E7800 Pure hypercholesterolemia, unspecified: Secondary | ICD-10-CM | POA: Insufficient documentation

## 2015-03-31 DIAGNOSIS — R1084 Generalized abdominal pain: Secondary | ICD-10-CM | POA: Diagnosis present

## 2015-03-31 DIAGNOSIS — C7989 Secondary malignant neoplasm of other specified sites: Secondary | ICD-10-CM | POA: Diagnosis not present

## 2015-03-31 LAB — COMPREHENSIVE METABOLIC PANEL
ALBUMIN: 3.1 g/dL — AB (ref 3.5–5.0)
ALK PHOS: 209 U/L — AB (ref 38–126)
ALT: 96 U/L — AB (ref 17–63)
ANION GAP: 5 (ref 5–15)
AST: 69 U/L — ABNORMAL HIGH (ref 15–41)
BILIRUBIN TOTAL: 0.9 mg/dL (ref 0.3–1.2)
BUN: 19 mg/dL (ref 6–20)
CALCIUM: 8.6 mg/dL — AB (ref 8.9–10.3)
CO2: 26 mmol/L (ref 22–32)
CREATININE: 0.89 mg/dL (ref 0.61–1.24)
Chloride: 105 mmol/L (ref 101–111)
GFR calc Af Amer: >60 mL/min (ref >60)
GFR calc non Af Amer: >60 mL/min (ref >60)
GLUCOSE: 150 mg/dL — AB (ref 65–99)
Potassium: 3.8 mmol/L (ref 3.5–5.1)
Sodium: 136 mmol/L (ref 135–145)
TOTAL PROTEIN: 7.2 g/dL (ref 6.5–8.1)

## 2015-03-31 LAB — CBC WITH DIFFERENTIAL/PLATELET
BASOS PCT: 0 %
Basophils Absolute: 0 10*3/uL (ref 0.0–0.1)
Eosinophils Absolute: 0.1 10*3/uL (ref 0.0–0.7)
Eosinophils Relative: 1 %
HEMATOCRIT: 42.2 % (ref 39.0–52.0)
HEMOGLOBIN: 13.5 g/dL (ref 13.0–17.0)
LYMPHS ABS: 0.8 10*3/uL (ref 0.7–4.0)
Lymphocytes Relative: 8 %
MCH: 33.1 pg (ref 26.0–34.0)
MCHC: 32 g/dL (ref 30.0–36.0)
MCV: 103.4 fL — ABNORMAL HIGH (ref 78.0–100.0)
MONOS PCT: 8 %
Monocytes Absolute: 0.7 10*3/uL (ref 0.1–1.0)
NEUTROS ABS: 8 10*3/uL — AB (ref 1.7–7.7)
NEUTROS PCT: 83 %
Platelets: 421 10*3/uL — ABNORMAL HIGH (ref 150–400)
RBC: 4.08 MIL/uL — ABNORMAL LOW (ref 4.22–5.81)
RDW: 13.9 % (ref 11.5–15.5)
WBC: 9.7 10*3/uL (ref 4.0–10.5)

## 2015-03-31 LAB — LIPASE, BLOOD: Lipase: 78 U/L — ABNORMAL HIGH (ref 11–51)

## 2015-03-31 MED ORDER — SODIUM CHLORIDE 0.9 % IV BOLUS (SEPSIS)
1000.0000 mL | Freq: Once | INTRAVENOUS | Status: AC
  Administered 2015-03-31: 1000 mL via INTRAVENOUS

## 2015-03-31 MED ORDER — FENTANYL CITRATE (PF) 100 MCG/2ML IJ SOLN
50.0000 ug | Freq: Once | INTRAMUSCULAR | Status: AC
  Administered 2015-03-31: 50 ug via INTRAVENOUS
  Filled 2015-03-31: qty 2

## 2015-03-31 MED ORDER — OXYCODONE-ACETAMINOPHEN 5-325 MG PO TABS: 1.0000 | ORAL_TABLET | Freq: Four times a day (QID) | ORAL | Status: AC | PRN

## 2015-03-31 MED ORDER — POLYETHYLENE GLYCOL 3350 17 G PO PACK: PACK | ORAL | Status: AC

## 2015-03-31 MED ORDER — PENTAFLUOROPROP-TETRAFLUOROETH EX AERO
INHALATION_SPRAY | CUTANEOUS | Status: AC
  Administered 2015-03-31: 30
  Filled 2015-03-31: qty 30

## 2015-03-31 MED ORDER — FENTANYL CITRATE (PF) 100 MCG/2ML IJ SOLN
100.0000 ug | Freq: Once | INTRAMUSCULAR | Status: AC
  Administered 2015-03-31: 100 ug via INTRAVENOUS
  Filled 2015-03-31: qty 2

## 2015-03-31 MED ORDER — ONDANSETRON HCL 4 MG/2ML IJ SOLN
4.0000 mg | Freq: Once | INTRAMUSCULAR | Status: AC
  Administered 2015-03-31: 4 mg via INTRAVENOUS
  Filled 2015-03-31: qty 2

## 2015-03-31 NOTE — Progress Notes (Signed)
ED CM received a voice message from [facility] staff requesting quantity for ordered miralax Cm consulted with EDP [facility]) and [facility] ([facility]) Dr Audie Pinto confirms pt needs 14-16 pks or what ever quantity is in the miralax box Cm spoke with casey at [phone] to confirm they are unable to give one package and the box to be opened contains 14 packages.  Dr Audie Pinto informed CM to tell Myriam Jacobson the quantity will be 13. Myriam Jacobson confirms this is all needed to process pt medication

## 2015-03-31 NOTE — ED Provider Notes (Addendum)
CSN: [id number]     Arrival date & time 03/31/15  0213 History   First MD Initiated Contact with Patient 03/31/15 0302     Chief Complaint  Patient presents with  . Abdominal Pain     (Consider location/radiation/quality/duration/timing/severity/associated sxs/prior Treatment) HPI  This is 59 year old male with history of colon cancer status post partial colectomy. His last chemotherapy was about a month ago and he has had abdominal pain since. He is here with worsening abdominal pain. The pain is generalized and worse when lying supine. He has to sleep sitting up or the pain is unbearable. At the present time he rates his pain about a 5 but it has been much worse at times. His abdomen is distended and this is worsened over the past week or so. He has had minimal vomiting. He is had minimal oral intake.  Past Medical History  Diagnosis Date  . Hypertension   . High cholesterol   . Anxiety   . Colon cancer ([place]) 07/30/2014  . Colon cancer metastasized to multiple sites ([place]) 08/20/2014  . Transfusion history     [facility] last of March 29-2016   Past Surgical History  Procedure Laterality Date  . Colonoscopy N/A 07/29/2014    Procedure: COLONOSCOPY;  Surgeon: Ladene Artist, MD;  Location: [facility];  Service: Endoscopy;  Laterality: N/A;  . Laparoscopic partial colectomy N/A 07/30/2014    Procedure: LAPAROSCOPY CONVERTED TO EXPLORATORY LAPAROTOMY EXTENDED RIGHT COLECTOMY BIOPSY OF DIAPHRAGMATIC IMPLANT CLOSURE OF DIAPHRAGM CHOLECYSTECTOMY;  Surgeon: Jackolyn Confer, MD;  Location: [facility];  Service: General;  Laterality: N/A;  . Cholecystectomy N/A 07/30/2014    Procedure: LAPAROSCOPIC CHOLECYSTECTOMY WITH INTRAOPERATIVE CHOLANGIOGRAM;  Surgeon: Jackolyn Confer, MD;  Location: [facility];  Service: General;  Laterality: N/A;  . Ercp N/A 08/06/2014    Procedure: ENDOSCOPIC RETROGRADE CHOLANGIOPANCREATOGRAPHY (ERCP);  Surgeon: Inda Castle, MD;  Location: [facility];  Service: Endoscopy;   Laterality: N/A;  . Portacath placement Right 08/16/2014    Procedure: INSERTION PORT-A-CATH;  Surgeon: Excell Seltzer, MD;  Location: [facility];  Service: General;  Laterality: Right;  port placement right subclavian  . Ercp N/A 09/14/2014    Procedure: ENDOSCOPIC RETROGRADE CHOLANGIOPANCREATOGRAPHY (ERCP);  Surgeon: Ladene Artist, MD;  Location: [facility];  Service: Endoscopy;  Laterality: N/A;  stent removal  . Esophagogastroduodenoscopy     Family History  Problem Relation Age of Onset  . Colon cancer Neg Hx    Social History  Substance Use Topics  . Smoking status: Former Smoker -- 1.00 packs/day for 30 years    Types: Cigarettes    Quit date: 07/28/2014  . Smokeless tobacco: Never Used     Comment: quit 02- 2016  . Alcohol Use: No    Review of Systems  All other systems reviewed and are negative.   Allergies  Review of patient's allergies indicates no known allergies.  Home Medications   Prior to Admission medications   Medication Sig Start Date End Date Taking? Authorizing Provider  ALPRAZolam Duanne Moron) 1 MG tablet Take 1 tablet (1 mg total) by mouth 4 (four) times daily as needed for anxiety. 03/21/15  Yes Volanda Napoleon, MD  aspirin 81 MG tablet Take 81 mg by mouth daily.   Yes Historical Provider, MD  atenolol (TENORMIN) 100 MG tablet Take 100 mg by mouth daily.   Yes Historical Provider, MD  HYDROcodone-acetaminophen (NORCO/VICODIN) 5-325 MG tablet Take 1 tablet by mouth every 4 (four) hours as needed for moderate pain. 03/28/15  Yes [name], NP  lidocaine-prilocaine (EMLA) cream Apply to affected area once 08/24/14  Yes Volanda Napoleon, MD  lovastatin (MEVACOR) 40 MG tablet Take 40 mg by mouth at bedtime.   Yes Historical Provider, MD  mirtazapine (REMERON) 15 MG tablet Take 0.5 tablets (7.5 mg total) by mouth 2 (two) times daily. 12/21/14  Yes Volanda Napoleon, MD  Multiple Vitamin (MULTIVITAMIN) tablet Take 1 tablet by mouth daily.   Yes Historical  Provider, MD  ondansetron (ZOFRAN) 8 MG tablet Take 1 tablet (8 mg total) by mouth 2 (two) times daily. Start the day after chemo for 3 days, then as needed for n&v. 12/29/14  Yes Volanda Napoleon, MD  pantoprazole (PROTONIX) 40 MG tablet Take 1 tablet (40 mg total) by mouth 2 (two) times daily. 03/22/15  Yes [name], NP  predniSONE (DELTASONE) 10 MG tablet TAKE ONE TABLET BY MOUTH ONE TIME DAILY WITH BREAKFAST 12/08/14  Yes [name], NP  prochlorperazine (COMPAZINE) 10 MG tablet Take 1 tablet (10 mg total) by mouth every 6 (six) hours as needed (Nausea or vomiting). 08/24/14  Yes Volanda Napoleon, MD  senna (SENOKOT) 8.6 MG TABS tablet Take 1 tablet (8.6 mg total) by mouth daily as needed for mild constipation. 08/16/14  Yes Venetia Maxon Rama, MD  Skin Protectants, Misc. (EUCERIN) cream Apply 1 application topically daily. Applied to hands and feet   Yes Historical Provider, MD  sucralfate (CARAFATE) 1 GM/10ML suspension Take 10 mLs (1 g total) by mouth 4 (four) times daily -  with meals and at bedtime. 03/28/15  Yes [name], NP  temazepam (RESTORIL) 15 MG capsule TAKE 1 CAPSULE BY MOUTH NIGHTLY AT BEDTIME AS NEEDED FOR SLEEP 02/15/15  Yes Volanda Napoleon, MD  capecitabine (XELODA) 500 MG tablet Take 4 pills TWICE a day for 14 days with food. 03/25/15   Volanda Napoleon, MD  traMADol (ULTRAM) 50 MG tablet Take 1 tablet (50 mg total) by mouth every 6 (six) hours as needed for severe pain. 09/01/14   Theodis Blaze, MD   BP 149/113 mmHg  Pulse 96  Temp(Src) 97.5 F (36.4 C)  Resp 20  Ht 5\' 9"  (1.753 m)  Wt 196 lb (88.905 kg)  BMI 28.93 kg/m2  SpO2 92%   Physical Exam  General: Well-developed, well-nourished male in no acute distress; appearance consistent with age of record HENT: normocephalic; atraumatic Eyes: pupils equal, round and reactive to light; extraocular muscles intact Neck: supple Heart: regular rate and rhythm Lungs: clear to auscultation  bilaterally Abdomen: soft; distended; diffusely tender; bowel sounds present Extremities: No deformity; full range of motion; pulses normal Neurologic: Awake, alert and oriented; motor function intact in all extremities and symmetric; no facial droop Skin: Warm and dry Psychiatric: Flat affect    ED Course  Procedures (including critical care time)   MDM  Nursing notes and vitals signs, including pulse oximetry, reviewed.  Summary of this visit's results, reviewed by myself:  Labs:  Results for orders placed or performed during the hospital encounter of 03/31/15 (from the past 24 hour(s))  CBC with Differential/Platelet     Status: Abnormal   Collection Time: 03/31/15  3:55 AM  Result Value Ref Range   WBC 9.7 4.0 - 10.5 K/uL   RBC 4.08 (L) 4.22 - 5.81 MIL/uL   Hemoglobin 13.5 13.0 - 17.0 g/dL   HCT 42.2 39.0 - 52.0 %   MCV 103.4 (H) 78.0 - 100.0 fL  MCH 33.1 26.0 - 34.0 pg   MCHC 32.0 30.0 - 36.0 g/dL   RDW 13.9 11.5 - 15.5 %   Platelets 421 (H) 150 - 400 K/uL   Neutrophils Relative % 83 %   Neutro Abs 8.0 (H) 1.7 - 7.7 K/uL   Lymphocytes Relative 8 %   Lymphs Abs 0.8 0.7 - 4.0 K/uL   Monocytes Relative 8 %   Monocytes Absolute 0.7 0.1 - 1.0 K/uL   Eosinophils Relative 1 %   Eosinophils Absolute 0.1 0.0 - 0.7 K/uL   Basophils Relative 0 %   Basophils Absolute 0.0 0.0 - 0.1 K/uL  Comprehensive metabolic panel     Status: Abnormal   Collection Time: 03/31/15  3:55 AM  Result Value Ref Range   Sodium 136 135 - 145 mmol/L   Potassium 3.8 3.5 - 5.1 mmol/L   Chloride 105 101 - 111 mmol/L   CO2 26 22 - 32 mmol/L   Glucose, Bld 150 (H) 65 - 99 mg/dL   BUN 19 6 - 20 mg/dL   Creatinine, Ser 0.89 0.61 - 1.24 mg/dL   Calcium 8.6 (L) 8.9 - 10.3 mg/dL   Total Protein 7.2 6.5 - 8.1 g/dL   Albumin 3.1 (L) 3.5 - 5.0 g/dL   AST 69 (H) 15 - 41 U/L   ALT 96 (H) 17 - 63 U/L   Alkaline Phosphatase 209 (H) 38 - 126 U/L   Total Bilirubin 0.9 0.3 - 1.2 mg/dL   GFR calc non Af Amer  >60 >60 mL/min   GFR calc Af Amer >60 >60 mL/min   Anion gap 5 5 - 15  Lipase, blood     Status: Abnormal   Collection Time: 03/31/15  3:55 AM  Result Value Ref Range   Lipase 78 (H) 11 - 51 U/L    Imaging Studies: Dg Abd Acute W/chest  03/31/2015  CLINICAL DATA:  Acute onset of generalized abdominal pain. Patient on chemotherapy for colon cancer. Initial encounter. EXAM: DG ABDOMEN ACUTE W/ 1V CHEST COMPARISON:  CT of the abdomen and pelvis performed 03/22/2015 FINDINGS: The lungs are hypoexpanded. Bibasilar atelectasis is noted. Small bilateral pleural effusions are seen. There is no evidence of pneumothorax. The cardiomediastinal silhouette is borderline enlarged. A right-sided chest port is noted ending about the distal SVC. The visualized bowel gas pattern is unremarkable. Scattered fluid and air are seen within the colon; there is no evidence of small bowel dilatation to suggest obstruction. No free intra-abdominal air is identified on the provided upright view. Clips are noted about the right side of the abdomen. No acute osseous abnormalities are seen; the sacroiliac joints are unremarkable in appearance. IMPRESSION: 1. Unremarkable bowel gas pattern; no free intra-abdominal air seen. 2. Lungs hypoexpanded. Bibasilar atelectasis noted. Small bilateral pleural effusions seen. Borderline cardiomegaly. Electronically Signed   By: Garald Balding M.D.   On: 03/31/2015 04:36   6:02 AM Pain well controlled with IV fentanyl. He had not been getting adequate relief with hydrocodone at home. He has been reticent to take the hydrocodone out of fear of constipation. He has not been taking his Senokot regularly either. We will switch him from hydrocodone to oxycodone and have him start taking MiraLAX daily. He was advised of his lab and x-ray findings. His transaminases, and phosphatase and lipase are slightly elevated but the clinical significance of this is not clear as his CT scan from the 18th was  largely unremarkable. He has family were devised contact Dr. Fuller Plan,  his gastroenterologist, today. His oncologist Dr. Marin Olp is out of town and they will contact him when he returns on October 31.  Shanon Rosser, MD 03/31/15 [id number]  Shanon Rosser, MD 03/31/15 [phone]

## 2015-03-31 NOTE — Discharge Instructions (Signed)

## 2015-03-31 NOTE — Telephone Encounter (Signed)
Patient wife calling about patient having dark, foul colored urine. He denies any pain or other symptoms. His po intake has been poor due to his abdominal pain and gastritis. Spoke to Sarah NP and she feels like the patient is dehydrated and wants him to come in tomorrow for fluids. Spoke to his wife and appointment made.

## 2015-03-31 NOTE — ED Notes (Signed)
Pt is a patient of Dr. Deberah Pelton for abd cancer.  Last Chemo tx Sept 20th, and has had abd pain ever since. Pain getting worse. Had EGD last week.

## 2015-03-31 NOTE — Telephone Encounter (Signed)
No further GI work up or follow up needed at this time. His pain is from metastatic colon cancer. Agree with ED MD advice to change pain medications and start Miralax. Oncology managing his pain and metastatic colon cancer.

## 2015-03-31 NOTE — Telephone Encounter (Signed)
Wife notified of Dr. Lynne Leader response.

## 2015-03-31 NOTE — Telephone Encounter (Signed)
Pharmacy called related to Rx: polyethylene glycol (MIRALAX / GLYCOLAX) packet .Marland KitchenMarland KitchenNCM clarified with EDP to add quantity to 30 packets.

## 2015-03-31 NOTE — Telephone Encounter (Signed)
Dr. Fuller Plan patient went to the ER last night for vomiting and abdominal pain.  Patient had a abdominal and chest x-ray that were unremarkable.  Wife is calling today at the instruction of Dr. Verta Ellen.  Dr. Fuller Plan the labs last night were slightly abnormal .  Please review and advise if needs additional orders.

## 2015-04-01 ENCOUNTER — Other Ambulatory Visit: Payer: Self-pay | Admitting: *Deleted

## 2015-04-01 ENCOUNTER — Ambulatory Visit (HOSPITAL_BASED_OUTPATIENT_CLINIC_OR_DEPARTMENT_OTHER): Payer: Medicaid Other

## 2015-04-01 VITALS — BP 138/91 | HR 86 | Temp 97.9°F | Resp 18

## 2015-04-01 DIAGNOSIS — Z5189 Encounter for other specified aftercare: Secondary | ICD-10-CM | POA: Diagnosis present

## 2015-04-01 DIAGNOSIS — C189 Malignant neoplasm of colon, unspecified: Secondary | ICD-10-CM

## 2015-04-01 DIAGNOSIS — C183 Malignant neoplasm of hepatic flexure: Secondary | ICD-10-CM

## 2015-04-01 MED ORDER — SODIUM CHLORIDE 0.9 % IV SOLN
INTRAVENOUS | Status: DC
  Administered 2015-04-01: 13:00:00 via INTRAVENOUS

## 2015-04-01 NOTE — Patient Instructions (Signed)

## 2015-04-03 ENCOUNTER — Inpatient Hospital Stay (HOSPITAL_BASED_OUTPATIENT_CLINIC_OR_DEPARTMENT_OTHER)
Admission: EM | Admit: 2015-04-03 | Discharge: 2015-05-05 | DRG: 374 | Disposition: E | Payer: Medicaid Other | Attending: Internal Medicine | Admitting: Internal Medicine

## 2015-04-03 ENCOUNTER — Encounter (HOSPITAL_BASED_OUTPATIENT_CLINIC_OR_DEPARTMENT_OTHER): Payer: Self-pay | Admitting: *Deleted

## 2015-04-03 ENCOUNTER — Emergency Department (HOSPITAL_BASED_OUTPATIENT_CLINIC_OR_DEPARTMENT_OTHER): Payer: Medicaid Other

## 2015-04-03 DIAGNOSIS — Z9049 Acquired absence of other specified parts of digestive tract: Secondary | ICD-10-CM

## 2015-04-03 DIAGNOSIS — R7989 Other specified abnormal findings of blood chemistry: Secondary | ICD-10-CM | POA: Diagnosis present

## 2015-04-03 DIAGNOSIS — J90 Pleural effusion, not elsewhere classified: Secondary | ICD-10-CM

## 2015-04-03 DIAGNOSIS — J910 Malignant pleural effusion: Secondary | ICD-10-CM

## 2015-04-03 DIAGNOSIS — R0602 Shortness of breath: Secondary | ICD-10-CM

## 2015-04-03 DIAGNOSIS — Z6834 Body mass index (BMI) 34.0-34.9, adult: Secondary | ICD-10-CM

## 2015-04-03 DIAGNOSIS — R39198 Other difficulties with micturition: Secondary | ICD-10-CM | POA: Diagnosis present

## 2015-04-03 DIAGNOSIS — E860 Dehydration: Secondary | ICD-10-CM | POA: Diagnosis present

## 2015-04-03 DIAGNOSIS — K259 Gastric ulcer, unspecified as acute or chronic, without hemorrhage or perforation: Secondary | ICD-10-CM | POA: Diagnosis present

## 2015-04-03 DIAGNOSIS — Z9889 Other specified postprocedural states: Secondary | ICD-10-CM | POA: Diagnosis not present

## 2015-04-03 DIAGNOSIS — Z79899 Other long term (current) drug therapy: Secondary | ICD-10-CM | POA: Diagnosis not present

## 2015-04-03 DIAGNOSIS — K649 Unspecified hemorrhoids: Secondary | ICD-10-CM | POA: Diagnosis present

## 2015-04-03 DIAGNOSIS — K560 Paralytic ileus: Secondary | ICD-10-CM | POA: Diagnosis present

## 2015-04-03 DIAGNOSIS — D473 Essential (hemorrhagic) thrombocythemia: Secondary | ICD-10-CM | POA: Diagnosis present

## 2015-04-03 DIAGNOSIS — T380X5A Adverse effect of glucocorticoids and synthetic analogues, initial encounter: Secondary | ICD-10-CM | POA: Diagnosis present

## 2015-04-03 DIAGNOSIS — R000 Tachycardia, unspecified: Secondary | ICD-10-CM | POA: Diagnosis present

## 2015-04-03 DIAGNOSIS — K219 Gastro-esophageal reflux disease without esophagitis: Secondary | ICD-10-CM | POA: Diagnosis present

## 2015-04-03 DIAGNOSIS — E7800 Pure hypercholesterolemia, unspecified: Secondary | ICD-10-CM | POA: Diagnosis present

## 2015-04-03 DIAGNOSIS — F411 Generalized anxiety disorder: Secondary | ICD-10-CM | POA: Diagnosis present

## 2015-04-03 DIAGNOSIS — K2970 Gastritis, unspecified, without bleeding: Secondary | ICD-10-CM | POA: Diagnosis present

## 2015-04-03 DIAGNOSIS — D638 Anemia in other chronic diseases classified elsewhere: Secondary | ICD-10-CM | POA: Diagnosis present

## 2015-04-03 DIAGNOSIS — I10 Essential (primary) hypertension: Secondary | ICD-10-CM | POA: Diagnosis present

## 2015-04-03 DIAGNOSIS — Z87891 Personal history of nicotine dependence: Secondary | ICD-10-CM | POA: Diagnosis not present

## 2015-04-03 DIAGNOSIS — Z7952 Long term (current) use of systemic steroids: Secondary | ICD-10-CM | POA: Diagnosis not present

## 2015-04-03 DIAGNOSIS — R1013 Epigastric pain: Secondary | ICD-10-CM

## 2015-04-03 DIAGNOSIS — R0600 Dyspnea, unspecified: Secondary | ICD-10-CM

## 2015-04-03 DIAGNOSIS — W19XXXA Unspecified fall, initial encounter: Secondary | ICD-10-CM | POA: Diagnosis not present

## 2015-04-03 DIAGNOSIS — K859 Acute pancreatitis without necrosis or infection, unspecified: Secondary | ICD-10-CM | POA: Diagnosis present

## 2015-04-03 DIAGNOSIS — K659 Peritonitis, unspecified: Secondary | ICD-10-CM | POA: Diagnosis present

## 2015-04-03 DIAGNOSIS — E43 Unspecified severe protein-calorie malnutrition: Secondary | ICD-10-CM | POA: Diagnosis present

## 2015-04-03 DIAGNOSIS — Z7982 Long term (current) use of aspirin: Secondary | ICD-10-CM | POA: Diagnosis not present

## 2015-04-03 DIAGNOSIS — N179 Acute kidney failure, unspecified: Secondary | ICD-10-CM | POA: Diagnosis present

## 2015-04-03 DIAGNOSIS — R109 Unspecified abdominal pain: Secondary | ICD-10-CM | POA: Diagnosis present

## 2015-04-03 DIAGNOSIS — C799 Secondary malignant neoplasm of unspecified site: Secondary | ICD-10-CM | POA: Diagnosis present

## 2015-04-03 DIAGNOSIS — R180 Malignant ascites: Secondary | ICD-10-CM | POA: Diagnosis present

## 2015-04-03 DIAGNOSIS — Z515 Encounter for palliative care: Secondary | ICD-10-CM | POA: Diagnosis not present

## 2015-04-03 DIAGNOSIS — E8770 Fluid overload, unspecified: Secondary | ICD-10-CM | POA: Diagnosis present

## 2015-04-03 DIAGNOSIS — K5901 Slow transit constipation: Secondary | ICD-10-CM

## 2015-04-03 DIAGNOSIS — E876 Hypokalemia: Secondary | ICD-10-CM | POA: Diagnosis present

## 2015-04-03 DIAGNOSIS — R140 Abdominal distension (gaseous): Secondary | ICD-10-CM

## 2015-04-03 DIAGNOSIS — C184 Malignant neoplasm of transverse colon: Secondary | ICD-10-CM | POA: Diagnosis present

## 2015-04-03 DIAGNOSIS — D630 Anemia in neoplastic disease: Secondary | ICD-10-CM | POA: Diagnosis present

## 2015-04-03 DIAGNOSIS — Z66 Do not resuscitate: Secondary | ICD-10-CM | POA: Diagnosis present

## 2015-04-03 DIAGNOSIS — C786 Secondary malignant neoplasm of retroperitoneum and peritoneum: Secondary | ICD-10-CM | POA: Diagnosis present

## 2015-04-03 DIAGNOSIS — F4323 Adjustment disorder with mixed anxiety and depressed mood: Secondary | ICD-10-CM | POA: Diagnosis present

## 2015-04-03 DIAGNOSIS — J189 Pneumonia, unspecified organism: Secondary | ICD-10-CM | POA: Diagnosis present

## 2015-04-03 DIAGNOSIS — B370 Candidal stomatitis: Secondary | ICD-10-CM | POA: Diagnosis present

## 2015-04-03 DIAGNOSIS — D899 Disorder involving the immune mechanism, unspecified: Secondary | ICD-10-CM | POA: Diagnosis present

## 2015-04-03 DIAGNOSIS — C19 Malignant neoplasm of rectosigmoid junction: Principal | ICD-10-CM | POA: Diagnosis present

## 2015-04-03 DIAGNOSIS — R188 Other ascites: Secondary | ICD-10-CM

## 2015-04-03 DIAGNOSIS — C189 Malignant neoplasm of colon, unspecified: Secondary | ICD-10-CM | POA: Diagnosis present

## 2015-04-03 DIAGNOSIS — R627 Adult failure to thrive: Secondary | ICD-10-CM | POA: Diagnosis present

## 2015-04-03 DIAGNOSIS — R748 Abnormal levels of other serum enzymes: Secondary | ICD-10-CM | POA: Diagnosis present

## 2015-04-03 LAB — CBC WITH DIFFERENTIAL/PLATELET
Basophils Absolute: 0 10*3/uL (ref 0.0–0.1)
Basophils Relative: 1 %
EOS ABS: 0.1 10*3/uL (ref 0.0–0.7)
Eosinophils Relative: 2 %
HCT: 41.8 % (ref 39.0–52.0)
HEMOGLOBIN: 13.4 g/dL (ref 13.0–17.0)
LYMPHS ABS: 0.8 10*3/uL (ref 0.7–4.0)
LYMPHS PCT: 14 %
MCH: 32.8 pg (ref 26.0–34.0)
MCHC: 32.1 g/dL (ref 30.0–36.0)
MCV: 102.5 fL — ABNORMAL HIGH (ref 78.0–100.0)
Monocytes Absolute: 1.1 10*3/uL — ABNORMAL HIGH (ref 0.1–1.0)
Monocytes Relative: 20 %
NEUTROS ABS: 3.7 10*3/uL (ref 1.7–7.7)
NEUTROS PCT: 64 %
PLATELETS: 505 10*3/uL — AB (ref 150–400)
RBC: 4.08 MIL/uL — AB (ref 4.22–5.81)
RDW: 14.2 % (ref 11.5–15.5)
WBC: 5.8 10*3/uL (ref 4.0–10.5)

## 2015-04-03 LAB — COMPREHENSIVE METABOLIC PANEL
ALK PHOS: 321 U/L — AB (ref 38–126)
ALT: 123 U/L — AB (ref 17–63)
AST: 58 U/L — AB (ref 15–41)
Albumin: 3.1 g/dL — ABNORMAL LOW (ref 3.5–5.0)
Anion gap: 11 (ref 5–15)
BUN: 17 mg/dL (ref 6–20)
CALCIUM: 8.8 mg/dL — AB (ref 8.9–10.3)
CHLORIDE: 103 mmol/L (ref 101–111)
CO2: 22 mmol/L (ref 22–32)
CREATININE: 0.66 mg/dL (ref 0.61–1.24)
GFR calc Af Amer: >60 mL/min (ref >60)
Glucose, Bld: 105 mg/dL — ABNORMAL HIGH (ref 65–99)
Potassium: 3.7 mmol/L (ref 3.5–5.1)
Sodium: 136 mmol/L (ref 135–145)
Total Bilirubin: 1.2 mg/dL (ref 0.3–1.2)
Total Protein: 7.2 g/dL (ref 6.5–8.1)

## 2015-04-03 LAB — URINALYSIS, ROUTINE W REFLEX MICROSCOPIC
GLUCOSE, UA: NEGATIVE mg/dL
Hgb urine dipstick: NEGATIVE
Ketones, ur: >80 mg/dL — AB
LEUKOCYTES UA: NEGATIVE
Nitrite: NEGATIVE
PH: 6 (ref 5.0–8.0)
Protein, ur: 100 mg/dL — AB
Specific Gravity, Urine: >1.046 — ABNORMAL HIGH (ref 1.005–1.030)
Urobilinogen, UA: 1 mg/dL (ref 0.0–1.0)

## 2015-04-03 LAB — URINE MICROSCOPIC-ADD ON

## 2015-04-03 LAB — LIPASE, BLOOD: LIPASE: 63 U/L — AB (ref 11–51)

## 2015-04-03 LAB — I-STAT CG4 LACTIC ACID, ED: Lactic Acid, Venous: 0.79 mmol/L (ref 0.5–2.0)

## 2015-04-03 MED ORDER — LORAZEPAM 2 MG/ML IJ SOLN
0.5000 mg | Freq: Once | INTRAMUSCULAR | Status: AC
  Administered 2015-04-03: 0.5 mg via INTRAVENOUS
  Filled 2015-04-03: qty 1

## 2015-04-03 MED ORDER — ALPRAZOLAM 0.5 MG PO TABS
1.0000 mg | ORAL_TABLET | Freq: Once | ORAL | Status: AC
  Administered 2015-04-03: 1 mg via ORAL
  Filled 2015-04-03: qty 2

## 2015-04-03 MED ORDER — IOHEXOL 350 MG/ML SOLN
100.0000 mL | Freq: Once | INTRAVENOUS | Status: AC | PRN
  Administered 2015-04-03: 100 mL via INTRAVENOUS

## 2015-04-03 MED ORDER — IOHEXOL 300 MG/ML  SOLN
25.0000 mL | Freq: Once | INTRAMUSCULAR | Status: AC | PRN
  Administered 2015-04-03: 25 mL via ORAL

## 2015-04-03 MED ORDER — ONDANSETRON HCL 4 MG/2ML IJ SOLN
4.0000 mg | Freq: Once | INTRAMUSCULAR | Status: AC
  Administered 2015-04-03: 4 mg via INTRAVENOUS
  Filled 2015-04-03: qty 2

## 2015-04-03 MED ORDER — PENTAFLUOROPROP-TETRAFLUOROETH EX AERO
INHALATION_SPRAY | CUTANEOUS | Status: AC
  Administered 2015-04-03: 30
  Filled 2015-04-03: qty 30

## 2015-04-03 MED ORDER — SODIUM CHLORIDE 0.9 % IV BOLUS (SEPSIS)
1000.0000 mL | Freq: Once | INTRAVENOUS | Status: AC
  Administered 2015-04-03: 1000 mL via INTRAVENOUS

## 2015-04-03 MED ORDER — HYDROMORPHONE HCL 1 MG/ML IJ SOLN
0.5000 mg | Freq: Once | INTRAMUSCULAR | Status: AC
  Administered 2015-04-03: 0.5 mg via INTRAVENOUS
  Filled 2015-04-03: qty 1

## 2015-04-03 NOTE — ED Provider Notes (Signed)
CSN: [id number]     Arrival date & time 03/06/2015  1741 History  By signing my name below, I, Stephania Fragmin, attest that this documentation has been prepared under the direction and in the presence of Merrily Pew, MD. Electronically Signed: Stephania Fragmin, ED Scribe. 04/02/2015. 7:00 PM.  Chief Complaint  Patient presents with  . Shortness of Breath   The history is provided by the patient. No language interpreter was used.   HPI Comments: Donald Fry is a 59 y.o. male with a history of Stage IV colon cancer with multiple metastases (last had chemotherapy May) who presents to the Emergency Department complaining of constant, worsening abdominal bloating that began 2 weeks ago. He complains of associated LUQ abdominal pain, SOB that he believes is due to his abdominal bloating, as well as loss of appetite for the past 2-3 weeks. He states he has only eaten 1 plate of food in the last 2-3 weeks. Patient states he had been seen for the same abdominal bloating 3 days ago at the ED here and was told it was due to GERD - patient also had a CT abdomen at that time that was negative. He reports he last had surgery in March 2016 and had a grapefruit-sized tumor removed, as well as cholecystectomy done to get to the tumor. He states his weight fluctuated over the last several months from 165 lbs to 135 lbs after his surgery, back up to 198 lbs. Patient last had a BM about yesterday, nothing he has been taking Miralax. He denies being on home oxygen. He notes he is on Xanax 1 mg 4 times a day for anxiety. He denies leg swelling.  Oncologist: Dr. Marin Olp  Past Medical History  Diagnosis Date  . Hypertension   . High cholesterol   . Anxiety   . Transfusion history     [facility] last of March 29-2016  . Colon cancer ([place]) 07/30/2014  . Colon cancer metastasized to multiple sites [facility]) 08/20/2014   Past Surgical History  Procedure Laterality Date  . Colonoscopy N/A 07/29/2014    Procedure: COLONOSCOPY;  Surgeon:  Ladene Artist, MD;  Location: [facility] ENDOSCOPY;  Service: Endoscopy;  Laterality: N/A;  . Laparoscopic partial colectomy N/A 07/30/2014    Procedure: LAPAROSCOPY CONVERTED TO EXPLORATORY LAPAROTOMY EXTENDED RIGHT COLECTOMY BIOPSY OF DIAPHRAGMATIC IMPLANT CLOSURE OF DIAPHRAGM CHOLECYSTECTOMY;  Surgeon: Jackolyn Confer, MD;  Location: [facility] ORS;  Service: General;  Laterality: N/A;  . Cholecystectomy N/A 07/30/2014    Procedure: LAPAROSCOPIC CHOLECYSTECTOMY WITH INTRAOPERATIVE CHOLANGIOGRAM;  Surgeon: Jackolyn Confer, MD;  Location: [facility];  Service: General;  Laterality: N/A;  . Ercp N/A 08/06/2014    Procedure: ENDOSCOPIC RETROGRADE CHOLANGIOPANCREATOGRAPHY (ERCP);  Surgeon: Inda Castle, MD;  Location: [facility];  Service: Endoscopy;  Laterality: N/A;  . Portacath placement Right 08/16/2014    Procedure: INSERTION PORT-A-CATH;  Surgeon: Excell Seltzer, MD;  Location: [facility];  Service: General;  Laterality: Right;  port placement right subclavian  . Ercp N/A 09/14/2014    Procedure: ENDOSCOPIC RETROGRADE CHOLANGIOPANCREATOGRAPHY (ERCP);  Surgeon: Ladene Artist, MD;  Location: [facility];  Service: Endoscopy;  Laterality: N/A;  stent removal  . Esophagogastroduodenoscopy     Family History  Problem Relation Age of Onset  . Colon cancer Neg Hx    Social History  Substance Use Topics  . Smoking status: Former Smoker -- 1.00 packs/day for 30 years    Types: Cigarettes    Quit date: 07/28/2014  . Smokeless tobacco: Never Used  Comment: quit 02- 2016  . Alcohol Use: No    Review of Systems  Constitutional: Positive for appetite change (loss of appetite) and unexpected weight change (over past several months).  Respiratory: Positive for shortness of breath.   Cardiovascular: Negative for leg swelling.  Gastrointestinal: Positive for abdominal pain and abdominal distention.  All other systems reviewed and are negative.  Allergies  Review of patient's allergies indicates no known  allergies.  Home Medications   Prior to Admission medications   Medication Sig Start Date End Date Taking? Authorizing Provider  ALPRAZolam Duanne Moron) 1 MG tablet Take 1 tablet (1 mg total) by mouth 4 (four) times daily as needed for anxiety. 03/21/15   Volanda Napoleon, MD  aspirin 81 MG tablet Take 81 mg by mouth daily.    Historical Provider, MD  atenolol (TENORMIN) 100 MG tablet Take 100 mg by mouth daily.    Historical Provider, MD  lidocaine-prilocaine (EMLA) cream Apply to affected area once 08/24/14   Volanda Napoleon, MD  lovastatin (MEVACOR) 40 MG tablet Take 40 mg by mouth at bedtime.    Historical Provider, MD  mirtazapine (REMERON) 15 MG tablet Take 0.5 tablets (7.5 mg total) by mouth 2 (two) times daily. 12/21/14   Volanda Napoleon, MD  Multiple Vitamin (MULTIVITAMIN) tablet Take 1 tablet by mouth daily.    Historical Provider, MD  ondansetron (ZOFRAN) 8 MG tablet Take 1 tablet (8 mg total) by mouth 2 (two) times daily. Start the day after chemo for 3 days, then as needed for n&v. 12/29/14   Volanda Napoleon, MD  oxyCODONE-acetaminophen (ROXICET) 5-325 MG tablet Take 1-2 tablets by mouth every 6 (six) hours as needed for severe pain (for pain). 03/31/15   [name], MD  pantoprazole (PROTONIX) 40 MG tablet Take 1 tablet (40 mg total) by mouth 2 (two) times daily. 03/22/15   [name], NP  polyethylene glycol [facility] [name]) packet Take 1 packet with 8 ounces of water daily to help prevent constipation while taking narcotic pain medication. 03/31/15   [name], MD  predniSONE (DELTASONE) 10 MG tablet TAKE ONE TABLET BY MOUTH ONE TIME DAILY WITH BREAKFAST 12/08/14   [name], NP  prochlorperazine (COMPAZINE) 10 MG tablet Take 1 tablet (10 mg total) by mouth every 6 (six) hours as needed (Nausea or vomiting). 08/24/14   Volanda Napoleon, MD  senna (SENOKOT) 8.6 MG TABS tablet Take 1 tablet (8.6 mg total) by mouth daily as needed for mild constipation. 08/16/14   Venetia Maxon Rama, MD  Skin Protectants, Misc. (EUCERIN) cream Apply 1 application topically daily. Applied to hands and feet    Historical Provider, MD  sucralfate (CARAFATE) 1 GM/10ML suspension Take 10 mLs (1 g total) by mouth 4 (four) times daily -  with meals and at bedtime. 03/28/15   [name], NP  temazepam (RESTORIL) 15 MG capsule TAKE 1 CAPSULE BY MOUTH NIGHTLY AT BEDTIME AS NEEDED FOR SLEEP 02/15/15   Volanda Napoleon, MD   BP 142/99 mmHg  Pulse 89  Temp(Src) 98.4 F (36.9 C) (Oral)  Resp 25  Ht 5\' 9"  (1.753 m)  Wt 196 lb (88.905 kg)  BMI 28.93 kg/m2  SpO2 95% Physical Exam  Constitutional: He is oriented to person, place, and time. He appears well-developed and well-nourished. No distress.  HENT:  Head: Normocephalic and atraumatic.  Eyes: Conjunctivae and EOM are normal.  Neck: Neck supple.  Cardiovascular: Normal rate, regular rhythm and  normal heart sounds.  Exam reveals no gallop and no friction rub.   No murmur heard. Pulmonary/Chest: Effort normal. No respiratory distress.  Lungs are clear to auscultation. Shallow breaths.  Abdominal: He exhibits distension. There is no tenderness. There is no rebound and no guarding.  No fluid wave. Well-healing incision on abdomen.  Musculoskeletal: Normal range of motion.  Neurological: He is alert and oriented to person, place, and time.  Skin: Skin is warm and dry.  Psychiatric: He has a normal mood and affect. His behavior is normal.  Nursing note and vitals reviewed.   ED Course  Procedures (including critical care time)  DIAGNOSTIC STUDIES: Oxygen Saturation is 94% on RA, normal by my interpretation.    COORDINATION OF CARE: 6:15 PM - Discussed treatment plan with pt at bedside which includes CT scan. Pt verbalized understanding and agreed to plan.   Labs Review Labs Reviewed  CBC WITH DIFFERENTIAL/PLATELET - Abnormal; Notable for the following:    RBC 4.08 (*)    MCV 102.5 (*)    Platelets 505 (*)    Monocytes  Absolute 1.1 (*)    All other components within normal limits  COMPREHENSIVE METABOLIC PANEL - Abnormal; Notable for the following:    Glucose, Bld 105 (*)    Calcium 8.8 (*)    Albumin 3.1 (*)    AST 58 (*)    ALT 123 (*)    Alkaline Phosphatase 321 (*)    All other components within normal limits  LIPASE, BLOOD - Abnormal; Notable for the following:    Lipase 63 (*)    All other components within normal limits  URINALYSIS, ROUTINE W REFLEX MICROSCOPIC (NOT AT [facility]) - Abnormal; Notable for the following:    Color, Urine AMBER (*)    Specific Gravity, Urine >1.046 (*)    Bilirubin Urine MODERATE (*)    Ketones, ur >80 (*)    Protein, ur 100 (*)    All other components within normal limits  URINE MICROSCOPIC-ADD ON - Abnormal; Notable for the following:    Crystals CA OXALATE CRYSTALS (*)    All other components within normal limits  BODY FLUID CULTURE  LACTATE DEHYDROGENASE, BODY FLUID  GLUCOSE, PERITONEAL FLUID  PROTEIN, BODY FLUID  ALBUMIN, FLUID  I-STAT CG4 LACTIC ACID, ED    Imaging Review Ct Angio Chest Pe W/cm &/or Wo Cm  04/04/2015  CLINICAL DATA:  Increase shortness of breath for 2 days, diffuse abdominal pain and swelling with nausea, current history of colon cancer with right hemicolectomy, currently taking chemotherapy EXAM: CT ANGIOGRAPHY CHEST CT ABDOMEN AND PELVIS WITH CONTRAST TECHNIQUE: Multidetector CT imaging of the chest was performed using the standard protocol during bolus administration of intravenous contrast. Multiplanar CT image reconstructions and MIPs were obtained to evaluate the vascular anatomy. Multidetector CT imaging of the abdomen and pelvis was performed using the standard protocol during bolus administration of intravenous contrast. CONTRAST:  29mL OMNIPAQUE IOHEXOL 300 MG/ML SOLN, 199mL OMNIPAQUE IOHEXOL 350 MG/ML SOLN COMPARISON:  03/22/2015 FINDINGS: CTA CHEST FINDINGS There is a moderate pleural effusion, which is new from the prior study.  There is new left lower lobe consolidation which may represent atelectasis as a result of the effusion, although pneumonia or pneumonitis is not excluded. Peripheral consolidation in the lingula is unchanged and suggests atelectasis. On the right, there is a slight increase in a very small pleural effusion. There is consolidation in the inferior right middle lobe as well as in the right lower lobe,  but these areas appear unchanged. Send The thoracic aorta shows no dissection or dilatation. There is no filling defect appreciated within the pulmonary arterial system. Oral contrast is within the tired esophagus suggesting stasis or reflux. Thoracic inlet is normal. Thyroid is normal. Right Port-A-Cath is noted. No pericardial effusion. CT ABDOMEN and PELVIS FINDINGS Stable nonenhancing low-attenuation liver lesions. Status post cholecystectomy. Mild hepatic steatosis. The anterior border of the pancreas appears mildly indistinct, but he pancreas otherwise appears normal. Spleen is normal. No adrenal lesions.  Kidneys are normal. Mild atherosclerotic calcification of the aortoiliac vessels. Bladder is normal. Reproductive organs negative. Status post right hemicolectomy. Anastomosis noted in the anterior midline. Nonobstructive bowel gas pattern. Colon is again relatively decompressed. Fluid-filled small bowel loops noted throughout the abdomen, well within normal limits at about 2.5 cm in diameter. There is again a tiny focus of gas along the anterior margin of the gastric antrum, very similar to the prior study. There is otherwise no evidence of pneumoperitoneum. There is an increase in the volume of ascites in the abdomen, now with a moderate volume of ascites in the abdomen and pelvis. Ascites intercalates between the right diaphragm and the liver and appears somewhat organized in this location, possibly involving the liver capsule. There is also somewhat organized ascites in the region of the gastrohepatic  ligament, and a 3 cm possibly subcapsular collection of ascites along the posterior margin of the liver near the subhepatic space. There is ascites stone or pounding the lateral aspect of the spleen and intercalating between the left diaphragm and the spleen and was not. There is a small volume of ascites extending into the lesser sac. Periportal lymphadenopathy is unchanged. No no significant retroperitoneal adenopathy otherwise. No acute musculoskeletal findings. Review of the MIP images confirms the above findings. IMPRESSION: 1. Similar areas of scattered airspace disease in the right middle and lower lobe. This could represent atelectasis or pneumonia or pneumonitis. Small but slightly increased right pleural effusion. 2. Moderate left pleural effusion representing a change from the prior study. New left lower lobe consolidation could represent pneumonia/pneumonitis or atelectasis 3. No evidence of pulmonary embolism 4. Again identified is a tiny focus of gas adjacent to the gastric antrum. As previously described, this may represent a small gastric ulcer. 5. Although there is no other evidence of pneumoperitoneum, there has been a significant increase in the volume of ascites in the abdomen and upper pelvis. The ascites appears somewhat complex as described above suggesting the possibility of peritonitis. 6. Multiple small nonenhancing liver lesions stable. 7. Anterior margin of the pancreas appears somewhat indistinct. This could reflect pancreatitis, but the finding is equivocal and may be related to the presence of fluid in the lesser sac. 8. Relative to decompressed colon, numerous fluid-filled loops of small bowel are mildly prominent but still within normal limits. This likely reflects mild ileus. Critical Value/emergent results were called by telephone at the time of interpretation on 03/15/2015 at 10:29 pm to Dr. Merrily Pew , who verbally acknowledged these results. Electronically Signed   By:  Skipper Cliche M.D.   On: 03/31/2015 22:29   Ct Abdomen Pelvis W Contrast  03/10/2015  CLINICAL DATA:  Increase shortness of breath for 2 days, diffuse abdominal pain and swelling with nausea, current history of colon cancer with right hemicolectomy, currently taking chemotherapy EXAM: CT ANGIOGRAPHY CHEST CT ABDOMEN AND PELVIS WITH CONTRAST TECHNIQUE: Multidetector CT imaging of the chest was performed using the standard protocol during bolus administration of intravenous contrast.  Multiplanar CT image reconstructions and MIPs were obtained to evaluate the vascular anatomy. Multidetector CT imaging of the abdomen and pelvis was performed using the standard protocol during bolus administration of intravenous contrast. CONTRAST:  41mL OMNIPAQUE IOHEXOL 300 MG/ML SOLN, 138mL OMNIPAQUE IOHEXOL 350 MG/ML SOLN COMPARISON:  03/22/2015 FINDINGS: CTA CHEST FINDINGS There is a moderate pleural effusion, which is new from the prior study. There is new left lower lobe consolidation which may represent atelectasis as a result of the effusion, although pneumonia or pneumonitis is not excluded. Peripheral consolidation in the lingula is unchanged and suggests atelectasis. On the right, there is a slight increase in a very small pleural effusion. There is consolidation in the inferior right middle lobe as well as in the right lower lobe, but these areas appear unchanged. Send The thoracic aorta shows no dissection or dilatation. There is no filling defect appreciated within the pulmonary arterial system. Oral contrast is within the tired esophagus suggesting stasis or reflux. Thoracic inlet is normal. Thyroid is normal. Right Port-A-Cath is noted. No pericardial effusion. CT ABDOMEN and PELVIS FINDINGS Stable nonenhancing low-attenuation liver lesions. Status post cholecystectomy. Mild hepatic steatosis. The anterior border of the pancreas appears mildly indistinct, but he pancreas otherwise appears normal. Spleen is normal.  No adrenal lesions.  Kidneys are normal. Mild atherosclerotic calcification of the aortoiliac vessels. Bladder is normal. Reproductive organs negative. Status post right hemicolectomy. Anastomosis noted in the anterior midline. Nonobstructive bowel gas pattern. Colon is again relatively decompressed. Fluid-filled small bowel loops noted throughout the abdomen, well within normal limits at about 2.5 cm in diameter. There is again a tiny focus of gas along the anterior margin of the gastric antrum, very similar to the prior study. There is otherwise no evidence of pneumoperitoneum. There is an increase in the volume of ascites in the abdomen, now with a moderate volume of ascites in the abdomen and pelvis. Ascites intercalates between the right diaphragm and the liver and appears somewhat organized in this location, possibly involving the liver capsule. There is also somewhat organized ascites in the region of the gastrohepatic ligament, and a 3 cm possibly subcapsular collection of ascites along the posterior margin of the liver near the subhepatic space. There is ascites stone or pounding the lateral aspect of the spleen and intercalating between the left diaphragm and the spleen and was not. There is a small volume of ascites extending into the lesser sac. Periportal lymphadenopathy is unchanged. No no significant retroperitoneal adenopathy otherwise. No acute musculoskeletal findings. Review of the MIP images confirms the above findings. IMPRESSION: 1. Similar areas of scattered airspace disease in the right middle and lower lobe. This could represent atelectasis or pneumonia or pneumonitis. Small but slightly increased right pleural effusion. 2. Moderate left pleural effusion representing a change from the prior study. New left lower lobe consolidation could represent pneumonia/pneumonitis or atelectasis 3. No evidence of pulmonary embolism 4. Again identified is a tiny focus of gas adjacent to the gastric  antrum. As previously described, this may represent a small gastric ulcer. 5. Although there is no other evidence of pneumoperitoneum, there has been a significant increase in the volume of ascites in the abdomen and upper pelvis. The ascites appears somewhat complex as described above suggesting the possibility of peritonitis. 6. Multiple small nonenhancing liver lesions stable. 7. Anterior margin of the pancreas appears somewhat indistinct. This could reflect pancreatitis, but the finding is equivocal and may be related to the presence of fluid in the lesser sac.  8. Relative to decompressed colon, numerous fluid-filled loops of small bowel are mildly prominent but still within normal limits. This likely reflects mild ileus. Critical Value/emergent results were called by telephone at the time of interpretation on 04/01/2015 at 10:29 pm to Dr. Merrily Pew , who verbally acknowledged these results. Electronically Signed   By: Skipper Cliche M.D.   On: 03/19/2015 22:29   I have personally reviewed and evaluated these images and lab results as part of my medical decision-making.   MDM   Final diagnoses:  SOB (shortness of breath)  Abdominal distension  Ascites  Pleural effusion   59 year old male here with worsening abdominal distention, fatigue, loss of appetite, nausea vomiting decreased bowel movements. Also with tachypnea some chest pain. History of cancer status post hemicolectomy in March was on chemotherapy until September of this year. Examination as above with distention and mild tenderness but no rebound or guarding. Has a well-healing incision in his mid abdomen. Lungs with crackles in the bases but otherwise normal slightly tachycardic on my exam tachypnea And 91% on room air. His main complaint is shortness of breath this is likely secondary to ascites in his abdomen however cannot exclude PE and he has high risk for that. CT scan of his chest and abdomen were done which showed evidence of  no pulmonary was be has new fluid collection is left lower lobe. Also found to have moderate sized ascites that appears to be complex in nature likely secondary to a gastric ulcer that is not healed from a couple weeks ago. Discussed this case with surgery and medicine team at [facility] where the patient preferred to go and the medicine team accepted him for admission there and surgery will consult as soon as the patient arrives. Deferred paracentesis at this time since fluid would need to be transported to [facility].  I have personally and contemperaneously reviewed labs and imaging and used in my decision making as above.   A medical screening exam was performed and I feel the patient has had an appropriate workup for their chief complaint at this time and likelihood of emergent condition existing is low. They have been counseled on decision, discharge, follow up and which symptoms necessitate immediate return to the emergency department. They or their family verbally stated understanding and agreement with plan and discharged in stable condition.     I personally performed the services described in this documentation, which was scribed in my presence. The recorded information has been reviewed and is accurate.      Merrily Pew, MD 04/04/15 [phone]

## 2015-04-03 NOTE — ED Notes (Signed)
Pt has hx of colon cancer and had chemo (last tx in May) states has been feeling SOB today with abdominal bloating

## 2015-04-04 ENCOUNTER — Inpatient Hospital Stay (HOSPITAL_COMMUNITY): Payer: Medicaid Other

## 2015-04-04 ENCOUNTER — Other Ambulatory Visit: Payer: Medicaid Other

## 2015-04-04 ENCOUNTER — Ambulatory Visit: Payer: Medicaid Other | Admitting: Family

## 2015-04-04 DIAGNOSIS — R1013 Epigastric pain: Secondary | ICD-10-CM

## 2015-04-04 DIAGNOSIS — R109 Unspecified abdominal pain: Secondary | ICD-10-CM | POA: Diagnosis present

## 2015-04-04 DIAGNOSIS — C184 Malignant neoplasm of transverse colon: Secondary | ICD-10-CM

## 2015-04-04 DIAGNOSIS — I10 Essential (primary) hypertension: Secondary | ICD-10-CM

## 2015-04-04 DIAGNOSIS — R1084 Generalized abdominal pain: Secondary | ICD-10-CM

## 2015-04-04 LAB — BODY FLUID CELL COUNT WITH DIFFERENTIAL
Eos, Fluid: 1 %
Lymphs, Fluid: 53 %
MONOCYTE-MACROPHAGE-SEROUS FLUID: 42 % — AB (ref 50–90)
Neutrophil Count, Fluid: 4 % (ref 0–25)
WBC FLUID: 312 uL (ref 0–1000)

## 2015-04-04 LAB — COMPREHENSIVE METABOLIC PANEL
ALK PHOS: 325 U/L — AB (ref 38–126)
ALT: 116 U/L — AB (ref 17–63)
ANION GAP: 13 (ref 5–15)
AST: 58 U/L — ABNORMAL HIGH (ref 15–41)
Albumin: 2.8 g/dL — ABNORMAL LOW (ref 3.5–5.0)
BILIRUBIN TOTAL: 1.5 mg/dL — AB (ref 0.3–1.2)
BUN: 16 mg/dL (ref 6–20)
CALCIUM: 8.6 mg/dL — AB (ref 8.9–10.3)
CO2: 22 mmol/L (ref 22–32)
CREATININE: 0.7 mg/dL (ref 0.61–1.24)
Chloride: 103 mmol/L (ref 101–111)
Glucose, Bld: 112 mg/dL — ABNORMAL HIGH (ref 65–99)
Potassium: 3.6 mmol/L (ref 3.5–5.1)
SODIUM: 138 mmol/L (ref 135–145)
TOTAL PROTEIN: 6.9 g/dL (ref 6.5–8.1)

## 2015-04-04 LAB — LACTATE DEHYDROGENASE, PLEURAL OR PERITONEAL FLUID: LD, Fluid: 472 U/L — ABNORMAL HIGH (ref 3–23)

## 2015-04-04 LAB — CBC
HCT: 38.2 % — ABNORMAL LOW (ref 39.0–52.0)
HEMOGLOBIN: 12.9 g/dL — AB (ref 13.0–17.0)
MCH: 34.2 pg — ABNORMAL HIGH (ref 26.0–34.0)
MCHC: 33.8 g/dL (ref 30.0–36.0)
MCV: 101.3 fL — ABNORMAL HIGH (ref 78.0–100.0)
PLATELETS: 461 10*3/uL — AB (ref 150–400)
RBC: 3.77 MIL/uL — AB (ref 4.22–5.81)
RDW: 14.5 % (ref 11.5–15.5)
WBC: 6.3 10*3/uL (ref 4.0–10.5)

## 2015-04-04 LAB — C DIFFICILE QUICK SCREEN W PCR REFLEX
C DIFFICLE (CDIFF) ANTIGEN: NEGATIVE
C Diff interpretation: NEGATIVE
C Diff toxin: NEGATIVE

## 2015-04-04 LAB — LACTIC ACID, PLASMA
LACTIC ACID, VENOUS: 1 mmol/L (ref 0.5–2.0)
LACTIC ACID, VENOUS: 0.8 mmol/L (ref 0.5–2.0)

## 2015-04-04 LAB — PROCALCITONIN: Procalcitonin: 0.33 ng/mL

## 2015-04-04 LAB — ALBUMIN, FLUID (OTHER): Albumin, Fluid: 2.2 g/dL

## 2015-04-04 LAB — GRAM STAIN

## 2015-04-04 LAB — GLUCOSE, PERITONEAL FLUID: Glucose, Peritoneal Fluid: 81 mg/dL

## 2015-04-04 LAB — PROTIME-INR
INR: 1.24 (ref 0.00–1.49)
PROTHROMBIN TIME: 15.8 s — AB (ref 11.6–15.2)

## 2015-04-04 MED ORDER — LACTATED RINGERS IV SOLN
INTRAVENOUS | Status: AC
  Administered 2015-04-04: 02:00:00 via INTRAVENOUS

## 2015-04-04 MED ORDER — PANTOPRAZOLE SODIUM 40 MG IV SOLR: 40.0000 mg | Freq: Two times a day (BID) | INTRAVENOUS | Status: DC

## 2015-04-04 MED ORDER — DIPHENHYDRAMINE HCL 50 MG/ML IJ SOLN
12.5000 mg | Freq: Four times a day (QID) | INTRAMUSCULAR | Status: DC | PRN
  Administered 2015-04-10: 12.5 mg via INTRAVENOUS
  Administered 2015-04-11: 25 mg via INTRAVENOUS
  Administered 2015-04-11: 12.5 mg via INTRAVENOUS
  Filled 2015-04-04 (×4): qty 1

## 2015-04-04 MED ORDER — VANCOMYCIN HCL IN DEXTROSE 1-5 GM/200ML-% IV SOLN
1000.0000 mg | Freq: Once | INTRAVENOUS | Status: AC
  Administered 2015-04-04: 1000 mg via INTRAVENOUS
  Filled 2015-04-04: qty 200

## 2015-04-04 MED ORDER — ONDANSETRON HCL 4 MG/2ML IJ SOLN
4.0000 mg | Freq: Four times a day (QID) | INTRAMUSCULAR | Status: DC | PRN
  Administered 2015-04-11: 4 mg via INTRAVENOUS
  Filled 2015-04-04: qty 2

## 2015-04-04 MED ORDER — ONDANSETRON HCL 4 MG PO TABS: 4.0000 mg | ORAL_TABLET | Freq: Four times a day (QID) | ORAL | Status: DC | PRN

## 2015-04-04 MED ORDER — PANTOPRAZOLE SODIUM 40 MG IV SOLR
40.0000 mg | Freq: Two times a day (BID) | INTRAVENOUS | Status: DC
  Administered 2015-04-04 – 2015-04-12 (×17): 40 mg via INTRAVENOUS
  Filled 2015-04-04 (×20): qty 40

## 2015-04-04 MED ORDER — VANCOMYCIN HCL IN DEXTROSE 1-5 GM/200ML-% IV SOLN
1000.0000 mg | Freq: Three times a day (TID) | INTRAVENOUS | Status: DC
  Administered 2015-04-04 – 2015-04-06 (×6): 1000 mg via INTRAVENOUS
  Filled 2015-04-04 (×7): qty 200

## 2015-04-04 MED ORDER — CETYLPYRIDINIUM CHLORIDE 0.05 % MT LIQD
7.0000 mL | Freq: Two times a day (BID) | OROMUCOSAL | Status: DC
  Administered 2015-04-06 – 2015-04-11 (×8): 7 mL via OROMUCOSAL

## 2015-04-04 MED ORDER — PIPERACILLIN-TAZOBACTAM 3.375 G IVPB
3.3750 g | Freq: Three times a day (TID) | INTRAVENOUS | Status: DC
  Administered 2015-04-04 – 2015-04-05 (×3): 3.375 g via INTRAVENOUS
  Filled 2015-04-04 (×4): qty 50

## 2015-04-04 MED ORDER — PIPERACILLIN-TAZOBACTAM 3.375 G IVPB 30 MIN
3.3750 g | Freq: Once | INTRAVENOUS | Status: AC
  Administered 2015-04-04: 3.375 g via INTRAVENOUS
  Filled 2015-04-04: qty 50

## 2015-04-04 MED ORDER — LORAZEPAM 2 MG/ML IJ SOLN
0.5000 mg | INTRAMUSCULAR | Status: DC | PRN
  Administered 2015-04-04 (×2): 1 mg via INTRAVENOUS
  Administered 2015-04-04: 0.5 mg via INTRAVENOUS
  Administered 2015-04-05 – 2015-04-08 (×18): 1 mg via INTRAVENOUS
  Filled 2015-04-04 (×22): qty 1

## 2015-04-04 MED ORDER — SODIUM CHLORIDE 0.9 % IJ SOLN
3.0000 mL | Freq: Two times a day (BID) | INTRAMUSCULAR | Status: DC
  Administered 2015-04-04 – 2015-04-09 (×3): 3 mL via INTRAVENOUS

## 2015-04-04 MED ORDER — SODIUM CHLORIDE 0.9 % IJ SOLN
10.0000 mL | INTRAMUSCULAR | Status: DC | PRN
  Administered 2015-04-05 – 2015-04-09 (×3): 10 mL
  Administered 2015-04-09: 20 mL
  Administered 2015-04-12: 10 mL
  Filled 2015-04-04 (×5): qty 40

## 2015-04-04 MED ORDER — MORPHINE SULFATE (PF) 2 MG/ML IV SOLN
2.0000 mg | INTRAVENOUS | Status: DC | PRN
  Administered 2015-04-04 – 2015-04-09 (×47): 2 mg via INTRAVENOUS
  Filled 2015-04-04 (×49): qty 1

## 2015-04-04 MED ORDER — LORAZEPAM 2 MG/ML IJ SOLN
0.5000 mg | Freq: Three times a day (TID) | INTRAMUSCULAR | Status: DC | PRN
  Administered 2015-04-04: 0.5 mg via INTRAVENOUS
  Filled 2015-04-04: qty 1

## 2015-04-04 NOTE — Progress Notes (Signed)
ANTIBIOTIC CONSULT NOTE - INITIAL  Pharmacy Consult for Vancomycin and Zosyn  Indication: pneumonia  No Known Allergies  Patient Measurements: Height: 5\' 5"  (165.1 cm) Weight: 200 lb (90.719 kg) IBW/kg (Calculated) : 61.5 Adjusted Body Weight:   Vital Signs: Temp: 98.4 F (36.9 C) (10/31 0033) Temp Source: Oral (10/31 0033) BP: 155/89 mmHg (10/31 0033) Pulse Rate: 95 (10/31 0033) Intake/Output from previous day: 10/30 0701 - 10/31 0700 In: 1000 [IV Piggyback:1000] Out: 100 [Urine:100] Intake/Output from this shift: Total I/O In: 1000 [IV Piggyback:1000] Out: 100 [Urine:100]  Labs:  Recent Labs  03/22/2015 1850 04/04/15 0239  WBC 5.8 6.3  HGB 13.4 12.9*  PLT 505* 461*  CREATININE 0.66  --    Estimated Creatinine Clearance: 100.4 mL/min (by C-G formula based on Cr of 0.66). No results for input(s): VANCOTROUGH, VANCOPEAK, VANCORANDOM, GENTTROUGH, GENTPEAK, GENTRANDOM, TOBRATROUGH, TOBRAPEAK, TOBRARND, AMIKACINPEAK, AMIKACINTROU, AMIKACIN in the last 72 hours.   Microbiology: No results found for this or any previous visit (from the past 720 hour(s)).  Medical History: Past Medical History  Diagnosis Date  . Hypertension   . High cholesterol   . Anxiety   . Transfusion history     [facility] last of March 29-2016  . Colon cancer ([place]) 07/30/2014  . Colon cancer metastasized to multiple sites ([place]) 08/20/2014    Medications:  Anti-infectives    Start     Dose/Rate Route Frequency Ordered Stop   04/04/15 1400  vancomycin (VANCOCIN) IVPB 1000 mg/200 mL premix     1,000 mg 200 mL/hr over 60 Minutes Intravenous Every 8 hours 04/04/15 0308     04/04/15 1400  piperacillin-tazobactam (ZOSYN) IVPB 3.375 g     3.375 g 12.5 mL/hr over 240 Minutes Intravenous 3 times per day 04/04/15 0308     04/04/15 0115  vancomycin (VANCOCIN) IVPB 1000 mg/200 mL premix     1,000 mg 200 mL/hr over 60 Minutes Intravenous  Once 04/04/15 0100     04/04/15 0115  piperacillin-tazobactam  (ZOSYN) IVPB 3.375 g     3.375 g 100 mL/hr over 30 Minutes Intravenous  Once 04/04/15 0100 04/04/15 0309     Assessment: Patient with hx of colon cancer in ED with abdominal pain.  During workup MD notes likely PNA.  Vancomycin and Zosyn per pharmacy ordered.  First dose of antibiotics already ordered.  Goal of Therapy:  Vancomycin trough level 15-20 mcg/ml  Zosyn based on renal function Appropriate antibiotic dosing for renal function; eradication of infection   Plan:  Measure antibiotic drug levels at steady state Follow up culture results Vancomycin 1gm iv q8hr  Zosyn 3.375g IV Q8H infused over 4hrs.   Tyler Deis, Shea Stakes [place] 04/04/2015,3:09 AM

## 2015-04-04 NOTE — H&P (Signed)
PCP:   Donald Melter, MD   Chief Complaint:  Abdominal pain  HPI: 59 yo male h/o stage 4 colon cancer s/p resection and chemo ended in September comes in with 3 weeks of progressive worsening abdominal pain and "bloating".  Pt says he has been in the ED several times, was given something for constipation.  However the pain and swelling just continued to worsen.  He denies fevers.  Has been nauseas and sometimes vomits.  He says the pain has always been generalized, was never localized in the epigastrum.  Nothing has relieved the pain despite pain meds he takes at home.  No chills.  The last several days his abdomen has gotten really big and he has been having some associated sob, no cough.  No urinary symptoms.  Pt transferred from [facility] for abnormal CT scan, and surgical evaluation with likely superimposed pna.  Review of Systems:  Positive and negative as per HPI otherwise all other systems are negative  Past Medical History: Past Medical History  Diagnosis Date  . Hypertension   . High cholesterol   . Anxiety   . Transfusion history     [facility] last of March 29-2016  . Colon cancer ([place]) 07/30/2014  . Colon cancer metastasized to multiple sites [facility]) 08/20/2014   Past Surgical History  Procedure Laterality Date  . Colonoscopy N/A 07/29/2014    Procedure: COLONOSCOPY;  Surgeon: Ladene Artist, MD;  Location: [facility] ENDOSCOPY;  Service: Endoscopy;  Laterality: N/A;  . Laparoscopic partial colectomy N/A 07/30/2014    Procedure: LAPAROSCOPY CONVERTED TO EXPLORATORY LAPAROTOMY EXTENDED RIGHT COLECTOMY BIOPSY OF DIAPHRAGMATIC IMPLANT CLOSURE OF DIAPHRAGM CHOLECYSTECTOMY;  Surgeon: Jackolyn Confer, MD;  Location: [facility] ORS;  Service: General;  Laterality: N/A;  . Cholecystectomy N/A 07/30/2014    Procedure: LAPAROSCOPIC CHOLECYSTECTOMY WITH INTRAOPERATIVE CHOLANGIOGRAM;  Surgeon: Jackolyn Confer, MD;  Location: [facility] ORS;  Service: General;  Laterality: N/A;  . Ercp N/A 08/06/2014    Procedure:  ENDOSCOPIC RETROGRADE CHOLANGIOPANCREATOGRAPHY (ERCP);  Surgeon: Inda Castle, MD;  Location: [facility];  Service: Endoscopy;  Laterality: N/A;  . Portacath placement Right 08/16/2014    Procedure: INSERTION PORT-A-CATH;  Surgeon: Excell Seltzer, MD;  Location: [facility] ORS;  Service: General;  Laterality: Right;  port placement right subclavian  . Ercp N/A 09/14/2014    Procedure: ENDOSCOPIC RETROGRADE CHOLANGIOPANCREATOGRAPHY (ERCP);  Surgeon: Ladene Artist, MD;  Location: [facility];  Service: Endoscopy;  Laterality: N/A;  stent removal  . Esophagogastroduodenoscopy      Medications: Prior to Admission medications   Medication Sig Start Date End Date Taking? Authorizing Provider  ALPRAZolam Duanne Moron) 1 MG tablet Take 1 tablet (1 mg total) by mouth 4 (four) times daily as needed for anxiety. 03/21/15   Volanda Napoleon, MD  aspirin 81 MG tablet Take 81 mg by mouth daily.    Historical Provider, MD  atenolol (TENORMIN) 100 MG tablet Take 100 mg by mouth daily.    Historical Provider, MD  lidocaine-prilocaine (EMLA) cream Apply to affected area once 08/24/14   Volanda Napoleon, MD  lovastatin (MEVACOR) 40 MG tablet Take 40 mg by mouth at bedtime.    Historical Provider, MD  mirtazapine (REMERON) 15 MG tablet Take 0.5 tablets (7.5 mg total) by mouth 2 (two) times daily. 12/21/14   Volanda Napoleon, MD  Multiple Vitamin (MULTIVITAMIN) tablet Take 1 tablet by mouth daily.    Historical Provider, MD  ondansetron (ZOFRAN) 8 MG tablet Take 1 tablet (8 mg total) by mouth 2 (  two) times daily. Start the day after chemo for 3 days, then as needed for n&v. 12/29/14   Volanda Napoleon, MD  oxyCODONE-acetaminophen (ROXICET) 5-325 MG tablet Take 1-2 tablets by mouth every 6 (six) hours as needed for severe pain (for pain). 03/31/15   [name], MD  pantoprazole (PROTONIX) 40 MG tablet Take 1 tablet (40 mg total) by mouth 2 (two) times daily. 03/22/15   [name], NP  polyethylene glycol [facility] /  [name]) packet Take 1 packet with 8 ounces of water daily to help prevent constipation while taking narcotic pain medication. 03/31/15   [name], MD  predniSONE (DELTASONE) 10 MG tablet TAKE ONE TABLET BY MOUTH ONE TIME DAILY WITH BREAKFAST 12/08/14   [name], NP  prochlorperazine (COMPAZINE) 10 MG tablet Take 1 tablet (10 mg total) by mouth every 6 (six) hours as needed (Nausea or vomiting). 08/24/14   Volanda Napoleon, MD  senna (SENOKOT) 8.6 MG TABS tablet Take 1 tablet (8.6 mg total) by mouth daily as needed for mild constipation. 08/16/14   Venetia Maxon Rama, MD  Skin Protectants, Misc. (EUCERIN) cream Apply 1 application topically daily. Applied to hands and feet    Historical Provider, MD  sucralfate (CARAFATE) 1 GM/10ML suspension Take 10 mLs (1 g total) by mouth 4 (four) times daily -  with meals and at bedtime. 03/28/15   [name], NP  temazepam (RESTORIL) 15 MG capsule TAKE 1 CAPSULE BY MOUTH NIGHTLY AT BEDTIME AS NEEDED FOR SLEEP 02/15/15   Volanda Napoleon, MD    Allergies:  No Known Allergies  Social History:  reports that he quit smoking about 8 months ago. His smoking use included Cigarettes. He has a 30 pack-year smoking history. He has never used smokeless tobacco. He reports that he does not drink alcohol or use illicit drugs.  Family History: Family History  Problem Relation Age of Onset  . Colon cancer Neg Hx     Physical Exam: Filed Vitals:   03/07/2015 2230 03/28/2015 2300 03/16/2015 2330 04/04/15 0033  BP: 135/101 143/106 142/99 155/89  Pulse: 88 90 89 95  Temp:    98.4 F (36.9 C)  TempSrc:    Oral  Resp: 19 25 25 22   Height:    5\' 5"  (1.651 m)  Weight:    90.719 kg (200 lb)  SpO2: 94% 96% 95% 97%   General appearance: alert, cooperative and no distress Head: Normocephalic, without obvious abnormality, atraumatic Eyes: negative Nose: Nares normal. Septum midline. Mucosa normal. No drainage or sinus tenderness. Neck: no JVD and supple,  symmetrical, trachea midline Lungs: clear to auscultation bilaterally Heart: regular rate and rhythm, S1, S2 normal, no murmur, click, rub or gallop Abdomen: abnormal findings:  ascites, distended, guarding, hyperactive bowel sounds and moderate tenderness in the entire abdomen Extremities: extremities normal, atraumatic, no cyanosis or edema Pulses: 2+ and symmetric Skin: Skin color, texture, turgor normal. No rashes or lesions Neurologic: Grossly normal   Labs on Admission:   Recent Labs  03/28/2015 1850  NA 136  K 3.7  CL 103  CO2 22  GLUCOSE 105*  BUN 17  CREATININE 0.66  CALCIUM 8.8*    Recent Labs  04/01/2015 1850  AST 58*  ALT 123*  ALKPHOS 321*  BILITOT 1.2  PROT 7.2  ALBUMIN 3.1*    Recent Labs  03/13/2015 1850  LIPASE 63*    Recent Labs  03/17/2015 1850  WBC 5.8  NEUTROABS 3.7  HGB 13.4  HCT 41.8  MCV 102.5*  PLT 505*    Radiological Exams on Admission: Ct Angio Chest Pe W/cm &/or Wo Cm  04/02/2015  CLINICAL DATA:  Increase shortness of breath for 2 days, diffuse abdominal pain and swelling with nausea, current history of colon cancer with right hemicolectomy, currently taking chemotherapy EXAM: CT ANGIOGRAPHY CHEST CT ABDOMEN AND PELVIS WITH CONTRAST TECHNIQUE: Multidetector CT imaging of the chest was performed using the standard protocol during bolus administration of intravenous contrast. Multiplanar CT image reconstructions and MIPs were obtained to evaluate the vascular anatomy. Multidetector CT imaging of the abdomen and pelvis was performed using the standard protocol during bolus administration of intravenous contrast. CONTRAST:  69mL OMNIPAQUE IOHEXOL 300 MG/ML SOLN, 137mL OMNIPAQUE IOHEXOL 350 MG/ML SOLN COMPARISON:  03/22/2015 FINDINGS: CTA CHEST FINDINGS There is a moderate pleural effusion, which is new from the prior study. There is new left lower lobe consolidation which may represent atelectasis as a result of the effusion, although pneumonia  or pneumonitis is not excluded. Peripheral consolidation in the lingula is unchanged and suggests atelectasis. On the right, there is a slight increase in a very small pleural effusion. There is consolidation in the inferior right middle lobe as well as in the right lower lobe, but these areas appear unchanged. Send The thoracic aorta shows no dissection or dilatation. There is no filling defect appreciated within the pulmonary arterial system. Oral contrast is within the tired esophagus suggesting stasis or reflux. Thoracic inlet is normal. Thyroid is normal. Right Port-A-Cath is noted. No pericardial effusion. CT ABDOMEN and PELVIS FINDINGS Stable nonenhancing low-attenuation liver lesions. Status post cholecystectomy. Mild hepatic steatosis. The anterior border of the pancreas appears mildly indistinct, but he pancreas otherwise appears normal. Spleen is normal. No adrenal lesions.  Kidneys are normal. Mild atherosclerotic calcification of the aortoiliac vessels. Bladder is normal. Reproductive organs negative. Status post right hemicolectomy. Anastomosis noted in the anterior midline. Nonobstructive bowel gas pattern. Colon is again relatively decompressed. Fluid-filled small bowel loops noted throughout the abdomen, well within normal limits at about 2.5 cm in diameter. There is again a tiny focus of gas along the anterior margin of the gastric antrum, very similar to the prior study. There is otherwise no evidence of pneumoperitoneum. There is an increase in the volume of ascites in the abdomen, now with a moderate volume of ascites in the abdomen and pelvis. Ascites intercalates between the right diaphragm and the liver and appears somewhat organized in this location, possibly involving the liver capsule. There is also somewhat organized ascites in the region of the gastrohepatic ligament, and a 3 cm possibly subcapsular collection of ascites along the posterior margin of the liver near the subhepatic space.  There is ascites stone or pounding the lateral aspect of the spleen and intercalating between the left diaphragm and the spleen and was not. There is a small volume of ascites extending into the lesser sac. Periportal lymphadenopathy is unchanged. No no significant retroperitoneal adenopathy otherwise. No acute musculoskeletal findings. Review of the MIP images confirms the above findings. IMPRESSION: 1. Similar areas of scattered airspace disease in the right middle and lower lobe. This could represent atelectasis or pneumonia or pneumonitis. Small but slightly increased right pleural effusion. 2. Moderate left pleural effusion representing a change from the prior study. New left lower lobe consolidation could represent pneumonia/pneumonitis or atelectasis 3. No evidence of pulmonary embolism 4. Again identified is a tiny focus of gas adjacent to the gastric antrum. As previously described, this  may represent a small gastric ulcer. 5. Although there is no other evidence of pneumoperitoneum, there has been a significant increase in the volume of ascites in the abdomen and upper pelvis. The ascites appears somewhat complex as described above suggesting the possibility of peritonitis. 6. Multiple small nonenhancing liver lesions stable. 7. Anterior margin of the pancreas appears somewhat indistinct. This could reflect pancreatitis, but the finding is equivocal and may be related to the presence of fluid in the lesser sac. 8. Relative to decompressed colon, numerous fluid-filled loops of small bowel are mildly prominent but still within normal limits. This likely reflects mild ileus. Critical Value/emergent results were called by telephone at the time of interpretation on 03/13/2015 at 10:29 pm to Dr. Merrily Pew , who verbally acknowledged these results. Electronically Signed   By: Skipper Cliche M.D.   On: 03/27/2015 22:29   Ct Abdomen Pelvis W Contrast  03/28/2015  CLINICAL DATA:  Increase shortness of breath  for 2 days, diffuse abdominal pain and swelling with nausea, current history of colon cancer with right hemicolectomy, currently taking chemotherapy EXAM: CT ANGIOGRAPHY CHEST CT ABDOMEN AND PELVIS WITH CONTRAST TECHNIQUE: Multidetector CT imaging of the chest was performed using the standard protocol during bolus administration of intravenous contrast. Multiplanar CT image reconstructions and MIPs were obtained to evaluate the vascular anatomy. Multidetector CT imaging of the abdomen and pelvis was performed using the standard protocol during bolus administration of intravenous contrast. CONTRAST:  69mL OMNIPAQUE IOHEXOL 300 MG/ML SOLN, 152mL OMNIPAQUE IOHEXOL 350 MG/ML SOLN COMPARISON:  03/22/2015 FINDINGS: CTA CHEST FINDINGS There is a moderate pleural effusion, which is new from the prior study. There is new left lower lobe consolidation which may represent atelectasis as a result of the effusion, although pneumonia or pneumonitis is not excluded. Peripheral consolidation in the lingula is unchanged and suggests atelectasis. On the right, there is a slight increase in a very small pleural effusion. There is consolidation in the inferior right middle lobe as well as in the right lower lobe, but these areas appear unchanged. Send The thoracic aorta shows no dissection or dilatation. There is no filling defect appreciated within the pulmonary arterial system. Oral contrast is within the tired esophagus suggesting stasis or reflux. Thoracic inlet is normal. Thyroid is normal. Right Port-A-Cath is noted. No pericardial effusion. CT ABDOMEN and PELVIS FINDINGS Stable nonenhancing low-attenuation liver lesions. Status post cholecystectomy. Mild hepatic steatosis. The anterior border of the pancreas appears mildly indistinct, but he pancreas otherwise appears normal. Spleen is normal. No adrenal lesions.  Kidneys are normal. Mild atherosclerotic calcification of the aortoiliac vessels. Bladder is normal. Reproductive  organs negative. Status post right hemicolectomy. Anastomosis noted in the anterior midline. Nonobstructive bowel gas pattern. Colon is again relatively decompressed. Fluid-filled small bowel loops noted throughout the abdomen, well within normal limits at about 2.5 cm in diameter. There is again a tiny focus of gas along the anterior margin of the gastric antrum, very similar to the prior study. There is otherwise no evidence of pneumoperitoneum. There is an increase in the volume of ascites in the abdomen, now with a moderate volume of ascites in the abdomen and pelvis. Ascites intercalates between the right diaphragm and the liver and appears somewhat organized in this location, possibly involving the liver capsule. There is also somewhat organized ascites in the region of the gastrohepatic ligament, and a 3 cm possibly subcapsular collection of ascites along the posterior margin of the liver near the subhepatic space. There is ascites  stone or pounding the lateral aspect of the spleen and intercalating between the left diaphragm and the spleen and was not. There is a small volume of ascites extending into the lesser sac. Periportal lymphadenopathy is unchanged. No no significant retroperitoneal adenopathy otherwise. No acute musculoskeletal findings. Review of the MIP images confirms the above findings. IMPRESSION: 1. Similar areas of scattered airspace disease in the right middle and lower lobe. This could represent atelectasis or pneumonia or pneumonitis. Small but slightly increased right pleural effusion. 2. Moderate left pleural effusion representing a change from the prior study. New left lower lobe consolidation could represent pneumonia/pneumonitis or atelectasis 3. No evidence of pulmonary embolism 4. Again identified is a tiny focus of gas adjacent to the gastric antrum. As previously described, this may represent a small gastric ulcer. 5. Although there is no other evidence of pneumoperitoneum, there  has been a significant increase in the volume of ascites in the abdomen and upper pelvis. The ascites appears somewhat complex as described above suggesting the possibility of peritonitis. 6. Multiple small nonenhancing liver lesions stable. 7. Anterior margin of the pancreas appears somewhat indistinct. This could reflect pancreatitis, but the finding is equivocal and may be related to the presence of fluid in the lesser sac. 8. Relative to decompressed colon, numerous fluid-filled loops of small bowel are mildly prominent but still within normal limits. This likely reflects mild ileus. Critical Value/emergent results were called by telephone at the time of interpretation on 03/15/2015 at 10:29 pm to Dr. Merrily Pew , who verbally acknowledged these results. Electronically Signed   By: Skipper Cliche M.D.   On: 03/11/2015 22:29   Ct Abdomen Pelvis W Contrast  03/22/2015  CLINICAL DATA:  Followup metastatic colon carcinoma. Undergoing chemotherapy. EXAM: CT CHEST, ABDOMEN, AND PELVIS WITH CONTRAST TECHNIQUE: Multidetector CT imaging of the chest, abdomen and pelvis was performed following the standard protocol during bolus administration of intravenous contrast. CONTRAST:  167mL OMNIPAQUE IOHEXOL 300 MG/ML  SOLN COMPARISON:  12/22/2014 FINDINGS: CT CHEST FINDINGS Mediastinum/Lymph Nodes: No masses, pathologically enlarged lymph nodes, or other significant abnormality. Lungs/Pleura: Mild increase in infiltrate or atelectasis is seen in the right middle and lower lobes. Mild atelectasis or scarring in the left lung base shows no significant change. New tiny right pleural effusion no no suspicious pulmonary nodules or masses identified. Musculoskeletal: No chest wall mass or suspicious bone lesions identified. CT ABDOMEN PELVIS FINDINGS Hepatobiliary: Hepatic steatosis again noted. Multiple small low-attenuation lesions in both right and left lobes show no significant change in size or number compared to previous  study. Index lesion in the left hepatic lobe measures 10 mm on image 53/series 2 compared to 10 mm previously. Index lesion in the posterior right hepatic lobe measures 14 mm on image 48/series 2 compared to 14 mm previously. No definite new or enlarging liver lesions identified. Prior cholecystectomy again noted. Pancreas: No mass, inflammatory changes, or other significant abnormality. Spleen: Within normal limits in size and appearance. Adrenals/Urinary Tract: No masses identified. No evidence of hydronephrosis. Stomach/Bowel: Previous right hemicolectomy again demonstrated. No evidence of bowel obstruction. Mild abdominal ascites is new since previous study. Increased wall thickening is seen involving gastric antrum with a tiny intramural air bubbles seen along the lesser curvature of the antrum on image 60/series 2. This is suspicious for acute gastritis with small antral ulcer, with metastatic disease considered less likely. There is no evidence of free intraperitoneal air. Vascular/Lymphatic: Mild lymphadenopathy seen in the porta hepatis with largest lymph node  measuring 11 mm on image 57/series 2 is increased in size. This could be inflammatory or neoplastic in etiology given gastric findings noted above. 8 mm left paraaortic retroperitoneal lymph node on image 63 is stable. No evidence of abdominal aortic aneursym. Reproductive: No mass or other significant abnormality. Other: None. Musculoskeletal:  No suspicious bone lesions identified. IMPRESSION: Stable hepatic steatosis and multiple small low-attenuation liver lesions. Increased wall thickening of the gastric antrum with tiny intramural air bubble along the lesser curvature. This is suspicious for gastritis with small antral ulcer, with neoplasm considered less likely. Mild ascites is seen however there is no evidence of free air. Consider endoscopy for further evaluation. Mildly porta hepatis lymphadenopathy which is new since previous study, and  could be reactive in etiology given above findings, although metastatic disease cannot definitely be excluded. Recommend continued attention on follow-up CT. Increased right middle and lower lobe atelectasis or infiltrate and tiny right pleural effusion. These results will be called to the ordering clinician or representative by the Radiologist Assistant, and communication documented in the PACS or zVision Dashboard. Electronically Signed   By: Earle Gell M.D.   On: 03/22/2015 08:58   Old chart reviewed Case discussed with [facility] doctor  Assessment/Plan  59 yo male with 3 weeks of worsening abdominal pain, nausea with vomiting with significant ct findings concerning for acute intraabdominal process with superimposed pna  Principal Problem:   Abdominal pain- ct scan shows new ascites with question of gastric ulcer? Perforation significant changes c/w ct done on oct 18.   abd exam is worrisome.  gen surgery is seeing patient tonight.  Will keep pt npo and asked RN not to give any pain meds until seen by gen surgery tonight in order not to affect his physical exam.  Hold anticoagulants, keep npo.  Further recommendations per surgical team.  Active Problems:    Likely pna - place on iv vanco and zosyn, should also cover intrabdominal also   Essential hypertension-  Hold meds at this time   Cancer of transverse colon with hemorrhage s/p colectomy 07/30/2014- this issue appears as separate issue from recent colon cancer, await surgery evaluation   Bile leak, postoperative- resolved from resection   Colon cancer metastasized to multiple sites [facility])- noted, completed full chemo in sept   Ascites-  As above  Admit to tele bed.  Full code.  Donald Fry,RACHAL A 04/04/2015, 12:46 AM

## 2015-04-04 NOTE — Procedures (Signed)
Ultrasound-guided diagnostic and therapeutic paracentesis performed yielding 1.7 liters slightly hazy, yellow fluid. A portion of the fluid was submitted to the lab for preordered studies. No immediate complications.

## 2015-04-04 NOTE — Progress Notes (Signed)
Pt seen and examined at bedside. Please see earlier admission note by Dr. Shanon Brow. I have requested IR consult for therapeutic and diagnostic paracentesis and thoracentesis. Surgery team following, assistance is appreciated. CBC and BMP  In AM.  Faye Ramsay, MD  [facility] Pager [phone]  If 7PM-7AM, please contact night-coverage [email or web address] Password [email or web address]

## 2015-04-04 NOTE — Consult Note (Signed)
Reason for Consult:abdominal pain, ? ulcer Referring Physician: [name] is an 58 y.o. male.  HPI:  Patient is a 59 yo M s/p extended r colectomy, cholecystectomy, biopsy of peritoneal nodules 07/30/2014 for bleeding tumor.  He has been getting chemo with Dr. Marin Olp.  His functional status has improved quite a bit since starting chemo.  He came to the ED at [place] with shortness of breath.  He has had 3 weeks of increasing abdominal pain, and has been to the ED for this multiple times.  His last chemotherapy was in late September.  The plan was to switch to oral chemo, but this has not occurred due to his abdominal pain.  The abdominal pain started around 3 weeks ago, and it has been gradually worsening.  He did not notice any acute change.  He has had a decreased ability to eat, with today having only 4 bites of mashed potatoes making him feel sick and hurt.  He has not had any fever or chills.  He has had worsening heartburn.  He denies any NSAID or EtOH use.  Accompanied by his sister, Donald Fry.    Past Medical History  Diagnosis Date  . Hypertension   . High cholesterol   . Anxiety   . Transfusion history     [facility] last of March 29-2016  . Colon cancer ([place]) 07/30/2014  . Colon cancer metastasized to multiple sites [facility]) 08/20/2014    Past Surgical History  Procedure Laterality Date  . Colonoscopy N/A 07/29/2014    Procedure: COLONOSCOPY;  Surgeon: Ladene Artist, MD;  Location: [facility] ENDOSCOPY;  Service: Endoscopy;  Laterality: N/A;  . Laparoscopic partial colectomy N/A 07/30/2014    Procedure: LAPAROSCOPY CONVERTED TO EXPLORATORY LAPAROTOMY EXTENDED RIGHT COLECTOMY BIOPSY OF DIAPHRAGMATIC IMPLANT CLOSURE OF DIAPHRAGM CHOLECYSTECTOMY;  Surgeon: Jackolyn Confer, MD;  Location: [facility] ORS;  Service: General;  Laterality: N/A;  . Cholecystectomy N/A 07/30/2014    Procedure: LAPAROSCOPIC CHOLECYSTECTOMY WITH INTRAOPERATIVE CHOLANGIOGRAM;  Surgeon: Jackolyn Confer, MD;   Location: [facility] ORS;  Service: General;  Laterality: N/A;  . Ercp N/A 08/06/2014    Procedure: ENDOSCOPIC RETROGRADE CHOLANGIOPANCREATOGRAPHY (ERCP);  Surgeon: Inda Castle, MD;  Location: [facility];  Service: Endoscopy;  Laterality: N/A;  . Portacath placement Right 08/16/2014    Procedure: INSERTION PORT-A-CATH;  Surgeon: Excell Seltzer, MD;  Location: [facility];  Service: General;  Laterality: Right;  port placement right subclavian  . Ercp N/A 09/14/2014    Procedure: ENDOSCOPIC RETROGRADE CHOLANGIOPANCREATOGRAPHY (ERCP);  Surgeon: Ladene Artist, MD;  Location: [facility];  Service: Endoscopy;  Laterality: N/A;  stent removal  . Esophagogastroduodenoscopy      Family History  Problem Relation Age of Onset  . Colon cancer Neg Hx     Social History:  reports that he quit smoking about 8 months ago. His smoking use included Cigarettes. He has a 30 pack-year smoking history. He has never used smokeless tobacco. He reports that he does not drink alcohol or use illicit drugs.  Allergies: No Known Allergies  Medications:  Prior to Admission:  Prescriptions prior to admission  Medication Sig Dispense Refill Last Dose  . ALPRAZolam (XANAX) 1 MG tablet Take 1 tablet (1 mg total) by mouth 4 (four) times daily as needed for anxiety. 120 tablet 0 04/04/2015 at Unknown time  . aspirin 81 MG tablet Take 81 mg by mouth daily.   03/13/2015 at Unknown time  . atenolol (TENORMIN) 100 MG tablet Take 100  mg by mouth daily.   03/22/2015 at 0530  . lidocaine-prilocaine (EMLA) cream Apply to affected area once 30 g 3 04/01/2015  . lovastatin (MEVACOR) 40 MG tablet Take 40 mg by mouth at bedtime.   04/02/2015 at Unknown time  . mirtazapine (REMERON) 15 MG tablet Take 0.5 tablets (7.5 mg total) by mouth 2 (two) times daily. 30 tablet 3 03/16/2015 at both doses  . Multiple Vitamin (MULTIVITAMIN) tablet Take 1 tablet by mouth daily.   03/13/2015 at Unknown time  . ondansetron (ZOFRAN) 8 MG tablet Take 1  tablet (8 mg total) by mouth 2 (two) times daily. Start the day after chemo for 3 days, then as needed for n&v. 60 tablet 3 unknown  . oxyCODONE-acetaminophen (ROXICET) 5-325 MG tablet Take 1-2 tablets by mouth every 6 (six) hours as needed for severe pain (for pain). 30 tablet 0 03/10/2015 at Unknown time  . pantoprazole (PROTONIX) 40 MG tablet Take 1 tablet (40 mg total) by mouth 2 (two) times daily. 60 tablet 3 03/28/2015 at Unknown time  . polyethylene glycol (MIRALAX / GLYCOLAX) packet Take 1 packet with 8 ounces of water daily to help prevent constipation while taking narcotic pain medication.   03/25/2015 at Unknown time  . predniSONE (DELTASONE) 10 MG tablet TAKE ONE TABLET BY MOUTH ONE TIME DAILY WITH BREAKFAST 30 tablet 3 03/06/2015 at Unknown time  . prochlorperazine (COMPAZINE) 10 MG tablet Take 1 tablet (10 mg total) by mouth every 6 (six) hours as needed (Nausea or vomiting). 30 tablet 1 unknown  . senna (SENOKOT) 8.6 MG TABS tablet Take 1 tablet (8.6 mg total) by mouth daily as needed for mild constipation. 120 each 0 Past Week at Unknown time  . Skin Protectants, Misc. (EUCERIN) cream Apply 1 application topically daily. Applied to hands and feet   unknown  . sucralfate (CARAFATE) 1 GM/10ML suspension Take 10 mLs (1 g total) by mouth 4 (four) times daily -  with meals and at bedtime. 420 mL 1 03/26/2015 at Unknown time  . temazepam (RESTORIL) 15 MG capsule TAKE 1 CAPSULE BY MOUTH NIGHTLY AT BEDTIME AS NEEDED FOR SLEEP 30 capsule 2 04/02/2015    Results for orders placed or performed during the hospital encounter of 04/02/2015 (from the past 48 hour(s))  CBC WITH DIFFERENTIAL     Status: Abnormal   Collection Time: 03/27/2015  6:50 PM  Result Value Ref Range   WBC 5.8 4.0 - 10.5 K/uL   RBC 4.08 (L) 4.22 - 5.81 MIL/uL   Hemoglobin 13.4 13.0 - 17.0 g/dL   HCT 41.8 39.0 - 52.0 %   MCV 102.5 (H) 78.0 - 100.0 fL   MCH 32.8 26.0 - 34.0 pg   MCHC 32.1 30.0 - 36.0 g/dL   RDW 14.2 11.5 -  15.5 %   Platelets 505 (H) 150 - 400 K/uL   Neutrophils Relative % 64 %   Neutro Abs 3.7 1.7 - 7.7 K/uL   Lymphocytes Relative 14 %   Lymphs Abs 0.8 0.7 - 4.0 K/uL   Monocytes Relative 20 %   Monocytes Absolute 1.1 (H) 0.1 - 1.0 K/uL   Eosinophils Relative 2 %   Eosinophils Absolute 0.1 0.0 - 0.7 K/uL   Basophils Relative 1 %   Basophils Absolute 0.0 0.0 - 0.1 K/uL   Smear Review MORPHOLOGY UNREMARKABLE   Comprehensive metabolic panel     Status: Abnormal   Collection Time: 03/09/2015  6:50 PM  Result Value Ref Range   Sodium 136  135 - 145 mmol/L   Potassium 3.7 3.5 - 5.1 mmol/L   Chloride 103 101 - 111 mmol/L   CO2 22 22 - 32 mmol/L   Glucose, Bld 105 (H) 65 - 99 mg/dL   BUN 17 6 - 20 mg/dL   Creatinine, Ser 0.66 0.61 - 1.24 mg/dL   Calcium 8.8 (L) 8.9 - 10.3 mg/dL   Total Protein 7.2 6.5 - 8.1 g/dL   Albumin 3.1 (L) 3.5 - 5.0 g/dL   AST 58 (H) 15 - 41 U/L   ALT 123 (H) 17 - 63 U/L   Alkaline Phosphatase 321 (H) 38 - 126 U/L   Total Bilirubin 1.2 0.3 - 1.2 mg/dL   GFR calc non Af Amer >60 >60 mL/min   GFR calc Af Amer >60 >60 mL/min    Comment: (NOTE) The eGFR has been calculated using the CKD EPI equation. This calculation has not been validated in all clinical situations. eGFR's persistently <60 mL/min signify possible Chronic Kidney Disease.    Anion gap 11 5 - 15  Lipase, blood     Status: Abnormal   Collection Time: 04/02/2015  6:50 PM  Result Value Ref Range   Lipase 63 (H) 11 - 51 U/L    Comment: Please note change in reference range.  Urinalysis, Routine w reflex microscopic (not at [facility])     Status: Abnormal   Collection Time: 03/23/2015  9:45 PM  Result Value Ref Range   Color, Urine AMBER (A) YELLOW    Comment: BIOCHEMICALS MAY BE AFFECTED BY COLOR   APPearance CLEAR CLEAR   Specific Gravity, Urine >1.046 (H) 1.005 - 1.030   pH 6.0 5.0 - 8.0   Glucose, UA NEGATIVE NEGATIVE mg/dL   Hgb urine dipstick NEGATIVE NEGATIVE   Bilirubin Urine MODERATE (A) NEGATIVE    Ketones, ur >80 (A) NEGATIVE mg/dL   Protein, ur 100 (A) NEGATIVE mg/dL   Urobilinogen, UA 1.0 0.0 - 1.0 mg/dL   Nitrite NEGATIVE NEGATIVE   Leukocytes, UA NEGATIVE NEGATIVE  Urine microscopic-add on     Status: Abnormal   Collection Time: 03/20/2015  9:45 PM  Result Value Ref Range   Squamous Epithelial / LPF RARE RARE   WBC, UA 0-2 <3 WBC/hpf   RBC / HPF 0-2 <3 RBC/hpf   Crystals CA OXALATE CRYSTALS (A) NEGATIVE   Urine-Other MUCOUS PRESENT   I-Stat CG4 Lactic Acid, ED     Status: None   Collection Time: 03/22/2015 11:15 PM  Result Value Ref Range   Lactic Acid, Venous 0.79 0.5 - 2.0 mmol/L  CBC     Status: Abnormal   Collection Time: 04/04/15  2:39 AM  Result Value Ref Range   WBC 6.3 4.0 - 10.5 K/uL   RBC 3.77 (L) 4.22 - 5.81 MIL/uL   Hemoglobin 12.9 (L) 13.0 - 17.0 g/dL   HCT 38.2 (L) 39.0 - 52.0 %   MCV 101.3 (H) 78.0 - 100.0 fL   MCH 34.2 (H) 26.0 - 34.0 pg   MCHC 33.8 30.0 - 36.0 g/dL   RDW 14.5 11.5 - 15.5 %   Platelets 461 (H) 150 - 400 K/uL    Ct Angio Chest Pe W/cm &/or Wo Cm  03/24/2015  CLINICAL DATA:  Increase shortness of breath for 2 days, diffuse abdominal pain and swelling with nausea, current history of colon cancer with right hemicolectomy, currently taking chemotherapy EXAM: CT ANGIOGRAPHY CHEST CT ABDOMEN AND PELVIS WITH CONTRAST TECHNIQUE: Multidetector CT imaging of the chest was  performed using the standard protocol during bolus administration of intravenous contrast. Multiplanar CT image reconstructions and MIPs were obtained to evaluate the vascular anatomy. Multidetector CT imaging of the abdomen and pelvis was performed using the standard protocol during bolus administration of intravenous contrast. CONTRAST:  13m OMNIPAQUE IOHEXOL 300 MG/ML SOLN, 1072mOMNIPAQUE IOHEXOL 350 MG/ML SOLN COMPARISON:  03/22/2015 FINDINGS: CTA CHEST FINDINGS There is a moderate pleural effusion, which is new from the prior study. There is new left lower lobe consolidation  which may represent atelectasis as a result of the effusion, although pneumonia or pneumonitis is not excluded. Peripheral consolidation in the lingula is unchanged and suggests atelectasis. On the right, there is a slight increase in a very small pleural effusion. There is consolidation in the inferior right middle lobe as well as in the right lower lobe, but these areas appear unchanged. Send The thoracic aorta shows no dissection or dilatation. There is no filling defect appreciated within the pulmonary arterial system. Oral contrast is within the tired esophagus suggesting stasis or reflux. Thoracic inlet is normal. Thyroid is normal. Right Port-A-Cath is noted. No pericardial effusion. CT ABDOMEN and PELVIS FINDINGS Stable nonenhancing low-attenuation liver lesions. Status post cholecystectomy. Mild hepatic steatosis. The anterior border of the pancreas appears mildly indistinct, but he pancreas otherwise appears normal. Spleen is normal. No adrenal lesions.  Kidneys are normal. Mild atherosclerotic calcification of the aortoiliac vessels. Bladder is normal. Reproductive organs negative. Status post right hemicolectomy. Anastomosis noted in the anterior midline. Nonobstructive bowel gas pattern. Colon is again relatively decompressed. Fluid-filled small bowel loops noted throughout the abdomen, well within normal limits at about 2.5 cm in diameter. There is again a tiny focus of gas along the anterior margin of the gastric antrum, very similar to the prior study. There is otherwise no evidence of pneumoperitoneum. There is an increase in the volume of ascites in the abdomen, now with a moderate volume of ascites in the abdomen and pelvis. Ascites intercalates between the right diaphragm and the liver and appears somewhat organized in this location, possibly involving the liver capsule. There is also somewhat organized ascites in the region of the gastrohepatic ligament, and a 3 cm possibly subcapsular  collection of ascites along the posterior margin of the liver near the subhepatic space. There is ascites stone or pounding the lateral aspect of the spleen and intercalating between the left diaphragm and the spleen and was not. There is a small volume of ascites extending into the lesser sac. Periportal lymphadenopathy is unchanged. No no significant retroperitoneal adenopathy otherwise. No acute musculoskeletal findings. Review of the MIP images confirms the above findings. IMPRESSION: 1. Similar areas of scattered airspace disease in the right middle and lower lobe. This could represent atelectasis or pneumonia or pneumonitis. Small but slightly increased right pleural effusion. 2. Moderate left pleural effusion representing a change from the prior study. New left lower lobe consolidation could represent pneumonia/pneumonitis or atelectasis 3. No evidence of pulmonary embolism 4. Again identified is a tiny focus of gas adjacent to the gastric antrum. As previously described, this may represent a small gastric ulcer. 5. Although there is no other evidence of pneumoperitoneum, there has been a significant increase in the volume of ascites in the abdomen and upper pelvis. The ascites appears somewhat complex as described above suggesting the possibility of peritonitis. 6. Multiple small nonenhancing liver lesions stable. 7. Anterior margin of the pancreas appears somewhat indistinct. This could reflect pancreatitis, but the finding is equivocal and may  be related to the presence of fluid in the lesser sac. 8. Relative to decompressed colon, numerous fluid-filled loops of small bowel are mildly prominent but still within normal limits. This likely reflects mild ileus. Critical Value/emergent results were called by telephone at the time of interpretation on 03/31/2015 at 10:29 pm to Dr. Merrily Pew , who verbally acknowledged these results. Electronically Signed   By: Skipper Cliche M.D.   On: 03/10/2015 22:29    Ct Abdomen Pelvis W Contrast  04/02/2015  CLINICAL DATA:  Increase shortness of breath for 2 days, diffuse abdominal pain and swelling with nausea, current history of colon cancer with right hemicolectomy, currently taking chemotherapy EXAM: CT ANGIOGRAPHY CHEST CT ABDOMEN AND PELVIS WITH CONTRAST TECHNIQUE: Multidetector CT imaging of the chest was performed using the standard protocol during bolus administration of intravenous contrast. Multiplanar CT image reconstructions and MIPs were obtained to evaluate the vascular anatomy. Multidetector CT imaging of the abdomen and pelvis was performed using the standard protocol during bolus administration of intravenous contrast. CONTRAST:  74m OMNIPAQUE IOHEXOL 300 MG/ML SOLN, 1054mOMNIPAQUE IOHEXOL 350 MG/ML SOLN COMPARISON:  03/22/2015 FINDINGS: CTA CHEST FINDINGS There is a moderate pleural effusion, which is new from the prior study. There is new left lower lobe consolidation which may represent atelectasis as a result of the effusion, although pneumonia or pneumonitis is not excluded. Peripheral consolidation in the lingula is unchanged and suggests atelectasis. On the right, there is a slight increase in a very small pleural effusion. There is consolidation in the inferior right middle lobe as well as in the right lower lobe, but these areas appear unchanged. Send The thoracic aorta shows no dissection or dilatation. There is no filling defect appreciated within the pulmonary arterial system. Oral contrast is within the tired esophagus suggesting stasis or reflux. Thoracic inlet is normal. Thyroid is normal. Right Port-A-Cath is noted. No pericardial effusion. CT ABDOMEN and PELVIS FINDINGS Stable nonenhancing low-attenuation liver lesions. Status post cholecystectomy. Mild hepatic steatosis. The anterior border of the pancreas appears mildly indistinct, but he pancreas otherwise appears normal. Spleen is normal. No adrenal lesions.  Kidneys are normal.  Mild atherosclerotic calcification of the aortoiliac vessels. Bladder is normal. Reproductive organs negative. Status post right hemicolectomy. Anastomosis noted in the anterior midline. Nonobstructive bowel gas pattern. Colon is again relatively decompressed. Fluid-filled small bowel loops noted throughout the abdomen, well within normal limits at about 2.5 cm in diameter. There is again a tiny focus of gas along the anterior margin of the gastric antrum, very similar to the prior study. There is otherwise no evidence of pneumoperitoneum. There is an increase in the volume of ascites in the abdomen, now with a moderate volume of ascites in the abdomen and pelvis. Ascites intercalates between the right diaphragm and the liver and appears somewhat organized in this location, possibly involving the liver capsule. There is also somewhat organized ascites in the region of the gastrohepatic ligament, and a 3 cm possibly subcapsular collection of ascites along the posterior margin of the liver near the subhepatic space. There is ascites stone or pounding the lateral aspect of the spleen and intercalating between the left diaphragm and the spleen and was not. There is a small volume of ascites extending into the lesser sac. Periportal lymphadenopathy is unchanged. No no significant retroperitoneal adenopathy otherwise. No acute musculoskeletal findings. Review of the MIP images confirms the above findings. IMPRESSION: 1. Similar areas of scattered airspace disease in the right middle and lower lobe. This  could represent atelectasis or pneumonia or pneumonitis. Small but slightly increased right pleural effusion. 2. Moderate left pleural effusion representing a change from the prior study. New left lower lobe consolidation could represent pneumonia/pneumonitis or atelectasis 3. No evidence of pulmonary embolism 4. Again identified is a tiny focus of gas adjacent to the gastric antrum. As previously described, this may  represent a small gastric ulcer. 5. Although there is no other evidence of pneumoperitoneum, there has been a significant increase in the volume of ascites in the abdomen and upper pelvis. The ascites appears somewhat complex as described above suggesting the possibility of peritonitis. 6. Multiple small nonenhancing liver lesions stable. 7. Anterior margin of the pancreas appears somewhat indistinct. This could reflect pancreatitis, but the finding is equivocal and may be related to the presence of fluid in the lesser sac. 8. Relative to decompressed colon, numerous fluid-filled loops of small bowel are mildly prominent but still within normal limits. This likely reflects mild ileus. Critical Value/emergent results were called by telephone at the time of interpretation on 03/21/2015 at 10:29 pm to Dr. Merrily Pew , who verbally acknowledged these results. Electronically Signed   By: Skipper Cliche M.D.   On: 03/27/2015 22:29    Review of Systems  HENT: Negative.   Eyes: Negative.   Respiratory: Positive for shortness of breath.   Cardiovascular: Negative.   Gastrointestinal: Positive for heartburn, nausea, abdominal pain and diarrhea (some, but taking laxatives so he won't get constipated.).  Genitourinary: Negative.   Musculoskeletal: Negative.   Skin: Negative.   Neurological: Positive for weakness.  Endo/Heme/Allergies: Negative.   Psychiatric/Behavioral: The patient is nervous/anxious.    Blood pressure 155/89, pulse 95, temperature 98.4 F (36.9 C), temperature source Oral, resp. rate 22, height 5' 5"  (1.651 m), weight 90.719 kg (200 lb), SpO2 97 %. Physical Exam  Constitutional: He is oriented to person, place, and time. He appears well-developed and well-nourished. He appears distressed.  HENT:  Head: Normocephalic and atraumatic.  Eyes: Conjunctivae are normal. No scleral icterus.  Neck: Normal range of motion.  Cardiovascular: Normal rate.   Respiratory: Effort normal. No  respiratory distress.  GI: Soft. He exhibits distension. There is tenderness (mild diffusely tender, worse in left far lateral abdomen). There is no rebound and no guarding.  Neurological: He is alert and oriented to person, place, and time.  Skin: Skin is warm and dry. No rash noted. He is not diaphoretic. No erythema. No pallor.  Psychiatric: He has a normal mood and affect. His behavior is normal. Judgment and thought content normal.    Assessment/Plan: Abdominal pain ? Ulcer Ascites Stage IV colon cancer B pleural effusion Ileus  Would treat patient as though he has ulcer by placing on PPI.  He does not have any contrast leak, no peritonitis, no fever or leukocytosis.  It is not clear to me if he has had a microperforation of an ulcer, or if the ascites is related to his malignancy.  He has known peritoneal implants, but I am not sure that this explains his symptoms.  I would keep him NPO on protonix and antibiotics for 24-48 hours and then ascertain whether he is making any improvements.  Continue holding chemo.  Would be useful to get an EGD, but if he has had a microperforation, this would not be a good time to perform.  We may be able to get upper GI.  I will discuss this with Dr. Dalbert Batman.  No need for urgent surgical intervention at  this time.    Donald Fry 04/04/2015, 3:07 AM

## 2015-04-04 NOTE — Progress Notes (Addendum)
Subjective: Stable and alert.  No vomiting.  Diffuse abdominal pain reported Afebrile.  Heart rate 100.  Respiratory rate 20.  SPO2 96%.  BP 135/94.  WBC 6300.  Hemoglobin 12.9.  Creatinine 0.7.  Alkaline phosphatase 325.  AST is 58.  A LT 116.  Albumin 2.8.  He has had abdominal symptoms for over 2 weeks. Upper endoscopy by Dr. Christen Butter on 03/23/2015 showed mild gastritis but no ulcer.  This makes it less likely that he is perforated ulcer. He has been on prednisone daily, used to be 20 mg a day but lately has been 10 mg a day.  Discussed management plan with Dr. Doyle Askew, [facility] hospital[facility].  Patient is to get paracentesis and thoracentesis today.  Dr. Doyle Askew is to arrange this.  This information will be helpful.  If his paracentesis shows enteric content he may need laparotomy.  Otherwise we'll continue to treat medically based on the data that we receive and his clinical course  Suspect moderate to severe protein calorie malnutrition due to poor intake over the past 3 weeks.  May need TNA.  Objective: Vital signs in last 24 hours: Temp:  [97.6 F (36.4 C)-98.4 F (36.9 C)] 97.6 F (36.4 C) (10/31 0605) Pulse Rate:  [84-102] 102 (10/31 0605) Resp:  [18-32] 20 (10/31 0605) BP: (131-155)/(89-106) 135/93 mmHg (10/31 0605) SpO2:  [91 %-97 %] 96 % (10/31 0605) Weight:  [88.905 kg (196 lb)-90.765 kg (200 lb 1.6 oz)] 90.765 kg (200 lb 1.6 oz) (10/31 0605) Last BM Date: 04/02/15  Intake/Output from previous day: 10/30 0701 - 10/31 0700 In: 1408.3 [I.V.:408.3; IV Piggyback:1000] Out: 150 [Urine:150] Intake/Output this shift:    General appearance: Alert and cooperative.  Uncomfortable.  Does not appear toxic.  Deconditioned. Resp: No wheeze or rhonchi, but decreased breath sounds left base. GI: Diffusely tender.  Slightly distended.  Bowel sounds present.  Midline wound intact.  No hernias.  Lab Results:   Recent Labs  03/22/2015 1850 04/04/15 0239  WBC 5.8 6.3  HGB 13.4  12.9*  HCT 41.8 38.2*  PLT 505* 461*   BMET  Recent Labs  03/25/2015 1850 04/04/15 0239  NA 136 138  K 3.7 3.6  CL 103 103  CO2 22 22  GLUCOSE 105* 112*  BUN 17 16  CREATININE 0.66 0.70  CALCIUM 8.8* 8.6*   PT/INR  Recent Labs  04/04/15 0239  LABPROT 15.8*  INR 1.24   ABG No results for input(s): PHART, HCO3 in the last 72 hours.  Invalid input(s): PCO2, PO2  Studies/Results: Ct Angio Chest Pe W/cm &/or Wo Cm  03/29/2015  CLINICAL DATA:  Increase shortness of breath for 2 days, diffuse abdominal pain and swelling with nausea, current history of colon cancer with right hemicolectomy, currently taking chemotherapy EXAM: CT ANGIOGRAPHY CHEST CT ABDOMEN AND PELVIS WITH CONTRAST TECHNIQUE: Multidetector CT imaging of the chest was performed using the standard protocol during bolus administration of intravenous contrast. Multiplanar CT image reconstructions and MIPs were obtained to evaluate the vascular anatomy. Multidetector CT imaging of the abdomen and pelvis was performed using the standard protocol during bolus administration of intravenous contrast. CONTRAST:  62mL OMNIPAQUE IOHEXOL 300 MG/ML SOLN, 121mL OMNIPAQUE IOHEXOL 350 MG/ML SOLN COMPARISON:  03/22/2015 FINDINGS: CTA CHEST FINDINGS There is a moderate pleural effusion, which is new from the prior study. There is new left lower lobe consolidation which may represent atelectasis as a result of the effusion, although pneumonia or pneumonitis is not excluded. Peripheral consolidation in the lingula  is unchanged and suggests atelectasis. On the right, there is a slight increase in a very small pleural effusion. There is consolidation in the inferior right middle lobe as well as in the right lower lobe, but these areas appear unchanged. Send The thoracic aorta shows no dissection or dilatation. There is no filling defect appreciated within the pulmonary arterial system. Oral contrast is within the tired esophagus suggesting stasis  or reflux. Thoracic inlet is normal. Thyroid is normal. Right Port-A-Cath is noted. No pericardial effusion. CT ABDOMEN and PELVIS FINDINGS Stable nonenhancing low-attenuation liver lesions. Status post cholecystectomy. Mild hepatic steatosis. The anterior border of the pancreas appears mildly indistinct, but he pancreas otherwise appears normal. Spleen is normal. No adrenal lesions.  Kidneys are normal. Mild atherosclerotic calcification of the aortoiliac vessels. Bladder is normal. Reproductive organs negative. Status post right hemicolectomy. Anastomosis noted in the anterior midline. Nonobstructive bowel gas pattern. Colon is again relatively decompressed. Fluid-filled small bowel loops noted throughout the abdomen, well within normal limits at about 2.5 cm in diameter. There is again a tiny focus of gas along the anterior margin of the gastric antrum, very similar to the prior study. There is otherwise no evidence of pneumoperitoneum. There is an increase in the volume of ascites in the abdomen, now with a moderate volume of ascites in the abdomen and pelvis. Ascites intercalates between the right diaphragm and the liver and appears somewhat organized in this location, possibly involving the liver capsule. There is also somewhat organized ascites in the region of the gastrohepatic ligament, and a 3 cm possibly subcapsular collection of ascites along the posterior margin of the liver near the subhepatic space. There is ascites stone or pounding the lateral aspect of the spleen and intercalating between the left diaphragm and the spleen and was not. There is a small volume of ascites extending into the lesser sac. Periportal lymphadenopathy is unchanged. No no significant retroperitoneal adenopathy otherwise. No acute musculoskeletal findings. Review of the MIP images confirms the above findings. IMPRESSION: 1. Similar areas of scattered airspace disease in the right middle and lower lobe. This could represent  atelectasis or pneumonia or pneumonitis. Small but slightly increased right pleural effusion. 2. Moderate left pleural effusion representing a change from the prior study. New left lower lobe consolidation could represent pneumonia/pneumonitis or atelectasis 3. No evidence of pulmonary embolism 4. Again identified is a tiny focus of gas adjacent to the gastric antrum. As previously described, this may represent a small gastric ulcer. 5. Although there is no other evidence of pneumoperitoneum, there has been a significant increase in the volume of ascites in the abdomen and upper pelvis. The ascites appears somewhat complex as described above suggesting the possibility of peritonitis. 6. Multiple small nonenhancing liver lesions stable. 7. Anterior margin of the pancreas appears somewhat indistinct. This could reflect pancreatitis, but the finding is equivocal and may be related to the presence of fluid in the lesser sac. 8. Relative to decompressed colon, numerous fluid-filled loops of small bowel are mildly prominent but still within normal limits. This likely reflects mild ileus. Critical Value/emergent results were called by telephone at the time of interpretation on 03/24/2015 at 10:29 pm to Dr. Merrily Pew , who verbally acknowledged these results. Electronically Signed   By: Skipper Cliche M.D.   On: 04/01/2015 22:29   Ct Abdomen Pelvis W Contrast  03/10/2015  CLINICAL DATA:  Increase shortness of breath for 2 days, diffuse abdominal pain and swelling with nausea, current history of colon  cancer with right hemicolectomy, currently taking chemotherapy EXAM: CT ANGIOGRAPHY CHEST CT ABDOMEN AND PELVIS WITH CONTRAST TECHNIQUE: Multidetector CT imaging of the chest was performed using the standard protocol during bolus administration of intravenous contrast. Multiplanar CT image reconstructions and MIPs were obtained to evaluate the vascular anatomy. Multidetector CT imaging of the abdomen and pelvis was  performed using the standard protocol during bolus administration of intravenous contrast. CONTRAST:  40mL OMNIPAQUE IOHEXOL 300 MG/ML SOLN, 123mL OMNIPAQUE IOHEXOL 350 MG/ML SOLN COMPARISON:  03/22/2015 FINDINGS: CTA CHEST FINDINGS There is a moderate pleural effusion, which is new from the prior study. There is new left lower lobe consolidation which may represent atelectasis as a result of the effusion, although pneumonia or pneumonitis is not excluded. Peripheral consolidation in the lingula is unchanged and suggests atelectasis. On the right, there is a slight increase in a very small pleural effusion. There is consolidation in the inferior right middle lobe as well as in the right lower lobe, but these areas appear unchanged. Send The thoracic aorta shows no dissection or dilatation. There is no filling defect appreciated within the pulmonary arterial system. Oral contrast is within the tired esophagus suggesting stasis or reflux. Thoracic inlet is normal. Thyroid is normal. Right Port-A-Cath is noted. No pericardial effusion. CT ABDOMEN and PELVIS FINDINGS Stable nonenhancing low-attenuation liver lesions. Status post cholecystectomy. Mild hepatic steatosis. The anterior border of the pancreas appears mildly indistinct, but he pancreas otherwise appears normal. Spleen is normal. No adrenal lesions.  Kidneys are normal. Mild atherosclerotic calcification of the aortoiliac vessels. Bladder is normal. Reproductive organs negative. Status post right hemicolectomy. Anastomosis noted in the anterior midline. Nonobstructive bowel gas pattern. Colon is again relatively decompressed. Fluid-filled small bowel loops noted throughout the abdomen, well within normal limits at about 2.5 cm in diameter. There is again a tiny focus of gas along the anterior margin of the gastric antrum, very similar to the prior study. There is otherwise no evidence of pneumoperitoneum. There is an increase in the volume of ascites in the  abdomen, now with a moderate volume of ascites in the abdomen and pelvis. Ascites intercalates between the right diaphragm and the liver and appears somewhat organized in this location, possibly involving the liver capsule. There is also somewhat organized ascites in the region of the gastrohepatic ligament, and a 3 cm possibly subcapsular collection of ascites along the posterior margin of the liver near the subhepatic space. There is ascites stone or pounding the lateral aspect of the spleen and intercalating between the left diaphragm and the spleen and was not. There is a small volume of ascites extending into the lesser sac. Periportal lymphadenopathy is unchanged. No no significant retroperitoneal adenopathy otherwise. No acute musculoskeletal findings. Review of the MIP images confirms the above findings. IMPRESSION: 1. Similar areas of scattered airspace disease in the right middle and lower lobe. This could represent atelectasis or pneumonia or pneumonitis. Small but slightly increased right pleural effusion. 2. Moderate left pleural effusion representing a change from the prior study. New left lower lobe consolidation could represent pneumonia/pneumonitis or atelectasis 3. No evidence of pulmonary embolism 4. Again identified is a tiny focus of gas adjacent to the gastric antrum. As previously described, this may represent a small gastric ulcer. 5. Although there is no other evidence of pneumoperitoneum, there has been a significant increase in the volume of ascites in the abdomen and upper pelvis. The ascites appears somewhat complex as described above suggesting the possibility of peritonitis. 6. Multiple  small nonenhancing liver lesions stable. 7. Anterior margin of the pancreas appears somewhat indistinct. This could reflect pancreatitis, but the finding is equivocal and may be related to the presence of fluid in the lesser sac. 8. Relative to decompressed colon, numerous fluid-filled loops of small  bowel are mildly prominent but still within normal limits. This likely reflects mild ileus. Critical Value/emergent results were called by telephone at the time of interpretation on 03/27/2015 at 10:29 pm to Dr. Merrily Pew , who verbally acknowledged these results. Electronically Signed   By: Skipper Cliche M.D.   On: 03/14/2015 22:29    Anti-infectives: Anti-infectives    Start     Dose/Rate Route Frequency Ordered Stop   04/04/15 1400  vancomycin (VANCOCIN) IVPB 1000 mg/200 mL premix     1,000 mg 200 mL/hr over 60 Minutes Intravenous Every 8 hours 04/04/15 0308     04/04/15 1400  piperacillin-tazobactam (ZOSYN) IVPB 3.375 g     3.375 g 12.5 mL/hr over 240 Minutes Intravenous 3 times per day 04/04/15 0308     04/04/15 0115  vancomycin (VANCOCIN) IVPB 1000 mg/200 mL premix     1,000 mg 200 mL/hr over 60 Minutes Intravenous  Once 04/04/15 0100 04/04/15 0339   04/04/15 0115  piperacillin-tazobactam (ZOSYN) IVPB 3.375 g     3.375 g 100 mL/hr over 30 Minutes Intravenous  Once 04/04/15 0100 04/04/15 0309      Assessment/Plan:   Abdominal pain and ascites.  It is not clear whether this is due to a GI perforation, enteritis, or malignancy. Agree with antibiotics and bowel rest Agree with double dose proton pump inhibitors Proceed with paracentesis and thoracentesis today If paracentesis shows clear-cut enteric content in surgical intervention will be warranted Otherwise ascites will be studied for cytology and microbiology  Question gastric ulcer.  This was ruled out by endoscopy on 03/23/2015.  Possibly could have developed an ulcer in the antrum, but seems less likely.  No extravasation seen on double contrast CT yesterday.  Advanced colon cancer, status post cholecystectomy and right colectomy and biopsy of peritoneal nodules on 07/30/2014.  He had abdominal carcinomatosis at that time and it is possible that this may be progressive, although no bulky disease seen on CT. We will let  Dr. Marin Olp know of his admission.  Pleural effusion and possible left lower lobe pneumonia.  Currently on broad-spectrum antibiotics  Immunocompromised due to chronic steroid use and moderate to severe protein calorie malnutrition Recommend TNA  Remote history of bile leak following cholecystectomy, treated with stent.  Stent subsequently removed.  Abnormal LFTs.  Etiology unclear.     LOS: 1 day    Kamran Coker M 04/04/2015

## 2015-04-05 ENCOUNTER — Ambulatory Visit: Payer: Medicaid Other | Admitting: Family

## 2015-04-05 ENCOUNTER — Inpatient Hospital Stay (HOSPITAL_COMMUNITY): Payer: Medicaid Other

## 2015-04-05 ENCOUNTER — Other Ambulatory Visit: Payer: Medicaid Other

## 2015-04-05 DIAGNOSIS — R0602 Shortness of breath: Secondary | ICD-10-CM

## 2015-04-05 DIAGNOSIS — R188 Other ascites: Secondary | ICD-10-CM

## 2015-04-05 DIAGNOSIS — J189 Pneumonia, unspecified organism: Secondary | ICD-10-CM

## 2015-04-05 DIAGNOSIS — C189 Malignant neoplasm of colon, unspecified: Secondary | ICD-10-CM

## 2015-04-05 DIAGNOSIS — D473 Essential (hemorrhagic) thrombocythemia: Secondary | ICD-10-CM

## 2015-04-05 DIAGNOSIS — B370 Candidal stomatitis: Secondary | ICD-10-CM | POA: Diagnosis present

## 2015-04-05 DIAGNOSIS — Z9889 Other specified postprocedural states: Secondary | ICD-10-CM

## 2015-04-05 DIAGNOSIS — R109 Unspecified abdominal pain: Secondary | ICD-10-CM

## 2015-04-05 DIAGNOSIS — K859 Acute pancreatitis without necrosis or infection, unspecified: Secondary | ICD-10-CM | POA: Diagnosis present

## 2015-04-05 DIAGNOSIS — E876 Hypokalemia: Secondary | ICD-10-CM

## 2015-04-05 DIAGNOSIS — J90 Pleural effusion, not elsewhere classified: Secondary | ICD-10-CM

## 2015-04-05 LAB — PROTEIN, BODY FLUID: TOTAL PROTEIN, FLUID: 3.2 g/dL

## 2015-04-05 LAB — CBC
HEMATOCRIT: 37 % — AB (ref 39.0–52.0)
HEMOGLOBIN: 12 g/dL — AB (ref 13.0–17.0)
MCH: 33.3 pg (ref 26.0–34.0)
MCHC: 32.4 g/dL (ref 30.0–36.0)
MCV: 102.8 fL — AB (ref 78.0–100.0)
Platelets: 398 10*3/uL (ref 150–400)
RBC: 3.6 MIL/uL — ABNORMAL LOW (ref 4.22–5.81)
RDW: 14.6 % (ref 11.5–15.5)
WBC: 5.7 10*3/uL (ref 4.0–10.5)

## 2015-04-05 LAB — BASIC METABOLIC PANEL
ANION GAP: 11 (ref 5–15)
BUN: 12 mg/dL (ref 6–20)
CALCIUM: 8.4 mg/dL — AB (ref 8.9–10.3)
CHLORIDE: 104 mmol/L (ref 101–111)
CO2: 25 mmol/L (ref 22–32)
Creatinine, Ser: 0.61 mg/dL (ref 0.61–1.24)
GFR calc non Af Amer: >60 mL/min (ref >60)
GLUCOSE: 110 mg/dL — AB (ref 65–99)
Potassium: 3.2 mmol/L — ABNORMAL LOW (ref 3.5–5.1)
Sodium: 140 mmol/L (ref 135–145)

## 2015-04-05 LAB — BODY FLUID CELL COUNT WITH DIFFERENTIAL
Eos, Fluid: 3 %
LYMPHS FL: 52 %
MONOCYTE-MACROPHAGE-SEROUS FLUID: 42 % — AB (ref 50–90)
Neutrophil Count, Fluid: 3 % (ref 0–25)
Total Nucleated Cell Count, Fluid: 877 cu mm (ref 0–1000)

## 2015-04-05 LAB — LIPASE, BLOOD: LIPASE: 74 U/L — AB (ref 11–51)

## 2015-04-05 LAB — GRAM STAIN

## 2015-04-05 LAB — LACTATE DEHYDROGENASE, PLEURAL OR PERITONEAL FLUID: LD FL: 166 U/L — AB (ref 3–23)

## 2015-04-05 LAB — GLUCOSE, SEROUS FLUID: GLUCOSE FL: 106 mg/dL

## 2015-04-05 MED ORDER — METHYLPREDNISOLONE SODIUM SUCC 40 MG IJ SOLR
10.0000 mg | Freq: Every day | INTRAMUSCULAR | Status: DC
  Administered 2015-04-05 – 2015-04-07 (×3): 10 mg via INTRAVENOUS
  Filled 2015-04-05 (×4): qty 0.25

## 2015-04-05 MED ORDER — FLUCONAZOLE IN SODIUM CHLORIDE 100-0.9 MG/50ML-% IV SOLN
100.0000 mg | INTRAVENOUS | Status: DC
  Administered 2015-04-05 – 2015-04-06 (×2): 100 mg via INTRAVENOUS
  Filled 2015-04-05 (×3): qty 50

## 2015-04-05 MED ORDER — MAGIC MOUTHWASH
10.0000 mL | Freq: Three times a day (TID) | ORAL | Status: DC
  Administered 2015-04-05 – 2015-04-11 (×18): 10 mL via ORAL
  Filled 2015-04-05 (×26): qty 10

## 2015-04-05 MED ORDER — PIPERACILLIN-TAZOBACTAM 3.375 G IVPB
3.3750 g | Freq: Three times a day (TID) | INTRAVENOUS | Status: DC
  Administered 2015-04-05 – 2015-04-07 (×5): 3.375 g via INTRAVENOUS
  Filled 2015-04-05 (×7): qty 50

## 2015-04-05 MED ORDER — GADOBENATE DIMEGLUMINE 529 MG/ML IV SOLN
20.0000 mL | Freq: Once | INTRAVENOUS | Status: AC | PRN
Start: 2015-04-05 — End: 2015-04-05
  Administered 2015-04-05: 18 mL via INTRAVENOUS

## 2015-04-05 NOTE — Progress Notes (Addendum)
Patient ID: Donald Fry, male   DOB: 1955-08-06, 59 y.o.   MRN: [id number]  [facility] PROGRESS NOTE  Donald Fry HKV:[id number] DOB: September 25, 1955 DOA: 04/02/2015 PCP: [name], MD   Brief narrative:    59 year old white male with known metastatic colon cancer, s/p resection back in March, on systemic chemotherapy presented for evaluation of abd pain several weeks in duration, has had recent upper endoscopy which was unremarkable. In [facility] ED, pt was noted to have ascites and has underwent paracentesis 1.7 L fluid was removed and due to concern of developing PNA pt was started on vancomycin and zosyn. CT chest was also notable for moderate left pleural effusion and IR consulted for thoracentesis.   Assessment/Plan:    Principal Problem:   Abdominal pain with ascites - unclear etiology . MRI abd with multiple loculated fluid collections suspicious for infected ascites, developing abscesses  - per surgery team no indication for surgical intervention at this time, continue with broad spectrum ABX - continue with PPI IV BID - s/p paracentesis 1.7 L fluid removed, abs still somewhat distended     Multifocal PNA, RML and RLL, LLL  - continue vanc and zosyn for now - also continue with stress dose steroids  - readjust the abx regimen based on clinical progress     Question gastric ulcer - ruled out by endoscopy on 03/23/2015.  - No extravasation seen on double contrast CT yesterday    ? Acute pancreatitis  - elevated lipase with imaging studies notable for it - keep NPO for now  - per surgery consider TNA - will reassess in AM    Hypokalemia - supplement and repeat BMP in AM    Oral thrush - place on Diflucan     Thrombocytosis - reactive, now resolved     Advanced colon cancer - status post cholecystectomy and right colectomy and biopsy of peritoneal nodules on 07/30/2014. - Appreciate Dr. Antonieta Pert involvement.    Left pleural effusion - thoracentesis done  today, ~ 300 cc fluid removed, follow up on fluid analysis     Immunocompromised - due to chronic steroid use and moderate to severe protein calorie malnutrition - place on stress dose solumedrol 10 mg PO QD until pt able to take PO     Remote history of bile leak following cholecystectomy - treated with stent which was subsequently removed.    Ileus - monitor for now    Abnormal LFTs - unclear etiology, will repeat in AM and may need to consult with GI team as well   DVT prophylaxis - SCD's  Code Status: Full.  Family Communication:  plan of care discussed with the patient Disposition Plan: Home when stable.   IV access:  Peripheral IV  Procedures and diagnostic studies:    Dg Eye Foreign Body 04/05/2015 No evidence of metallic foreign body within the orbits.   Dg Chest 1 View 04/05/2015  No pneumothorax following left thoracentesis. Small bilateral effusions with bibasilar atelectasis.   Ct Chest W ContrastCt Angio Chest Pe W/cm &/or Wo Cm 03/16/2015   Similar areas of scattered airspace disease in the right middle and lower lobe. This could represent atelectasis or pneumonia or pneumonitis. Small but slightly increased right pleural effusion. 2. Moderate left pleural effusion representing a change from the prior study. New left lower lobe consolidation could represent pneumonia/pneumonitis or atelectasis 3. No evidence of pulmonary embolism 4. Again identified is a tiny focus of gas adjacent to the gastric antrum. As previously  described, this may represent a small gastric ulcer. 5. Although there is no other evidence of pneumoperitoneum, there has been a significant increase in the volume of ascites in the abdomen and upper pelvis. The ascites appears somewhat complex as described above suggesting the possibility of peritonitis. 6. Multiple small nonenhancing liver lesions stable. 7. Anterior margin of the pancreas appears somewhat indistinct. This could reflect pancreatitis, but  the finding is equivocal and may be related to the presence of fluid in the lesser sac. 8. Relative to decompressed colon, numerous fluid-filled loops of small bowel are mildly prominent but still within normal limits. This likely reflects mild ileus.   Mr Abdomen W Wo Contrast 04/05/2015  Mild motion degradation. 2. Given this factor, no evidence of hepatic metastasis. 3. Multiple loculated fluid collections, including within the perihepatic space and left abdomen. These are suspicious for infected ascites. Developing abscess or abscesses cannot be excluded. 4. Bilateral pleural effusions with adjacent airspace disease. 5. Subtle pancreatic duct dilatation with an abrupt cut off in the region the pancreatic head. Although no dominant mass is seen in this area, there is hypo enhancement. Given limitations of the current exam, and comorbidities, consider follow-up with pancreatic protocol CT at 6-8 weeks (ideally after the patient is recovered from the acute episode) to exclude a non border deforming adenocarcinoma. 6. Apparent tiny filling defect in the portal vein could be artifactual, given absence of thrombus on the 03/07/2015 CT. Recommend attention on follow-up. 7. Mild prominence of small bowel loops, favoring adynamic ileus. 8. Upper abdominal adenopathy which could be reactive or metastatic.   US Paracentesis 04/04/2015  Successful ultrasound-guided diagnostic and therapeutic paracentesis yielding 1.7 liters of peritoneal fluid.   US Thoracentesis Asp Pleural Space W/img Guide 04/05/2015  Successful ultrasound-guided diagnostic and therapeutic left sided thoracentesis yielding 320 cc of pleural fluid.   Medical Consultants:  [facility] Oncology [facility]   Other Consultants:  None  IAnti-Infectives:   Vanc and Zosyn 10/31 -->  Faye Ramsay, MD  [facility] Pager [phone]  If 7PM-7AM, please contact night-coverage [email or web address] Password [email or web address] 04/05/2015, 6:23 PM   LOS: 2 days    HPI/Subjective: No events overnight.   Objective: Filed Vitals:   04/05/15 0512 04/05/15 1118 04/05/15 1445 04/05/15 1503  BP: 137/88 147/90 133/82 129/81  Pulse: 98 101    Temp: 98.4 F (36.9 C)     TempSrc: Oral     Resp: 18     Height:      Weight: 90.2 kg (198 lb 13.7 oz)     SpO2: 96% 97%      Intake/Output Summary (Last 24 hours) at 04/05/15 1823 Last data filed at 04/05/15 1231  Gross per 24 hour  Intake    285 ml  Output      0 ml  Net    285 ml    Exam:   General:  Pt is alert, follows commands appropriately, not in acute distress  Cardiovascular: Regular rate and rhythm, no rubs, no gallops  Respiratory: Clear to auscultation bilaterally, crackles at bases   Abdomen: Soft, slightly tender in epigastric area, distended, bowel sounds present, no guarding  Extremities: pulses DP and PT palpable bilaterally  Data Reviewed: Basic Metabolic Panel:  Recent Labs Lab 03/31/15 0355 03/29/2015 1850 04/04/15 0239 04/05/15 0255  NA 136 136 138 140  K 3.8 3.7 3.6 3.2*  CL 105 103 103 104  CO2 26 22 22 25   GLUCOSE 150* 105* 112* 110*  BUN 19 17 16  12  CREATININE 0.89 0.66 0.70 0.61  CALCIUM 8.6* 8.8* 8.6* 8.4*   Liver Function Tests:  Recent Labs Lab 03/31/15 0355 03/25/2015 1850 04/04/15 0239  AST 69* 58* 58*  ALT 96* 123* 116*  ALKPHOS 209* 321* 325*  BILITOT 0.9 1.2 1.5*  PROT 7.2 7.2 6.9  ALBUMIN 3.1* 3.1* 2.8*    Recent Labs Lab 03/31/15 0355 04/02/2015 1850 04/05/15 0255  LIPASE 78* 63* 74*   CBC:  Recent Labs Lab 03/31/15 0355 03/05/2015 1850 04/04/15 0239 04/05/15 0255  WBC 9.7 5.8 6.3 5.7  NEUTROABS 8.0* 3.7  --   --   HGB 13.5 13.4 12.9* 12.0*  HCT 42.2 41.8 38.2* 37.0*  MCV 103.4* 102.5* 101.3* 102.8*  PLT 421* 505* 461* 398     Recent Results (from the past 240 hour(s))  C difficile quick scan w PCR reflex     Status: None   Collection Time: 04/04/15 10:26 AM  Result Value Ref Range Status   C Diff antigen NEGATIVE  NEGATIVE Final   C Diff toxin NEGATIVE NEGATIVE Final   C Diff interpretation Negative for toxigenic C. difficile  Final  Culture, body fluid-bottle     Status: None (Preliminary result)   Collection Time: 04/04/15 11:13 AM  Result Value Ref Range Status   Specimen Description FLUID ASCITIC  Final   Special Requests NONE  Final   Culture   Final    NO GROWTH 1 DAY Performed at [facility]    Report Status PENDING  Incomplete  Gram stain     Status: None   Collection Time: 04/04/15 11:13 AM  Result Value Ref Range Status   Specimen Description FLUID ASCITIC  Final   Special Requests NONE  Final   Gram Stain   Final    FEW WBC PRESENT, PREDOMINANTLY MONONUCLEAR NO ORGANISMS SEEN Performed at [facility]    Report Status 04/04/2015 FINAL  Final     Scheduled Meds: . antiseptic oral rinse  7 mL Mouth Rinse BID  . fluconazole (DIFLUCAN) IV  100 mg Intravenous Q24H  . magic mouthwash  10 mL Oral TID  . methylPREDNISolone (SOLU-MEDROL) injection  10 mg Intravenous Daily  . pantoprazole (PROTONIX) IV  40 mg Intravenous Q12H  . piperacillin-tazobactam (ZOSYN)  IV  3.375 g Intravenous 3 times per day  . sodium chloride  3 mL Intravenous Q12H  . vancomycin  1,000 mg Intravenous Q8H   Continuous Infusions:

## 2015-04-05 NOTE — Consult Note (Signed)
Referral MD  Reason for Referral: New ascites and left pleural effusion. History of metastatic colon cancer.   Chief Complaint  Patient presents with  . Shortness of Breath  : I'm not be overeat for several weeks. My abdomen is painful.  HPI: Mr. Donald Fry is a very nice 59 year old white male. He is well-known to me. He has metastatic colon cancer. He had a  resection back in March. He has already had a normal CEA. 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  He has been on systemic chemotherapy. He has been on FOLFOXIRI. He has had 10 or 11 cycles. Our goal was to switch him over to oral Xeloda for maintenance. He's done well. His scans have never shown any obvious recurrence.  He began have this abdominal pain over the past several weeks. He has had scans done. The scans did not show any obvious malignancy.  He did undergo upper endoscopy. Upper endoscopy was pretty much unremarkable. There may been a ulcer.  He subsequently was admitted on the 31st. He had 1.7 L of ascites removed. It was hazy yellow.  He was started on IV antibiotics.  His labs showed a CEA of 0.7. His LFTs were up a little bit. Albumin was 2.8.  He had a CT angiogram done. This was negative for any pulmonary embolism. His found have a moderate left pleural effusion. This is being sampled today.  Surgery has seen him. They are holding off on any evaluation until some of the results back from his fluid.  He is just unable to eat that much. He says because he, he just gets very bloated.  He's had no bleeding. His wife says that he has had some bleeding from his hemorrhoids.  He has had some constipation.  He's had no leg swelling. He's had no rashes.  Overall, his performance status is ECOG 1.                   s   Past Medical History  Diagnosis Date  . Hypertension   . High cholesterol   . Anxiety   . Transfusion history     [facility] last of March 29-2016  . Colon cancer ([place]) 07/30/2014  .  Colon cancer metastasized to multiple sites [facility]) 08/20/2014  :  Past Surgical History  Procedure Laterality Date  . Colonoscopy N/A 07/29/2014    Procedure: COLONOSCOPY;  Surgeon: Ladene Artist, MD;  Location: [facility];  Service: Endoscopy;  Laterality: N/A;  . Laparoscopic partial colectomy N/A 07/30/2014    Procedure: LAPAROSCOPY CONVERTED TO EXPLORATORY LAPAROTOMY EXTENDED RIGHT COLECTOMY BIOPSY OF DIAPHRAGMATIC IMPLANT CLOSURE OF DIAPHRAGM CHOLECYSTECTOMY;  Surgeon: Jackolyn Confer, MD;  Location: [facility];  Service: General;  Laterality: N/A;  . Cholecystectomy N/A 07/30/2014    Procedure: LAPAROSCOPIC CHOLECYSTECTOMY WITH INTRAOPERATIVE CHOLANGIOGRAM;  Surgeon: Jackolyn Confer, MD;  Location: [facility];  Service: General;  Laterality: N/A;  . Ercp N/A 08/06/2014    Procedure: ENDOSCOPIC RETROGRADE CHOLANGIOPANCREATOGRAPHY (ERCP);  Surgeon: Inda Castle, MD;  Location: [facility];  Service: Endoscopy;  Laterality: N/A;  . Portacath placement Right 08/16/2014    Procedure: INSERTION PORT-A-CATH;  Surgeon: Excell Seltzer, MD;  Location: [facility];  Service: General;  Laterality: Right;  port placement right subclavian  . Ercp N/A 09/14/2014    Procedure: ENDOSCOPIC RETROGRADE CHOLANGIOPANCREATOGRAPHY (ERCP);  Surgeon: Ladene Artist, MD;  Location: [facility];  Service: Endoscopy;  Laterality: N/A;  stent removal  . Esophagogastroduodenoscopy    :  Current facility-administered medications:  .  antiseptic oral rinse (CPC / CETYLPYRIDINIUM CHLORIDE 0.05%) solution 7 mL, 7 mL, Mouth Rinse, BID, Phillips Grout, MD, 7 mL at 04/04/15 0145 .  diphenhydrAMINE (BENADRYL) injection 12.5-25 mg, 12.5-25 mg, Intravenous, Q6H PRN, Stark Klein, MD .  LORazepam (ATIVAN) injection 0.5-1 mg, 0.5-1 mg, Intravenous, Q4H PRN, Theodis Blaze, MD, 1 mg at 04/05/15 0219 .  morphine 2 MG/ML injection 2 mg, 2 mg, Intravenous, Q2H PRN, Phillips Grout, MD, 2 mg at 04/05/15 0425 .  ondansetron (ZOFRAN) tablet 4  mg, 4 mg, Oral, Q6H PRN **OR** ondansetron (ZOFRAN) injection 4 mg, 4 mg, Intravenous, Q6H PRN, Phillips Grout, MD .  pantoprazole (PROTONIX) injection 40 mg, 40 mg, Intravenous, Q12H, Phillips Grout, MD, 40 mg at 04/04/15 2147 .  piperacillin-tazobactam (ZOSYN) IVPB 3.375 g, 3.375 g, Intravenous, 3 times per day, Phillips Grout, MD, 3.375 g at 04/05/15 0207 .  sodium chloride 0.9 % injection 10-40 mL, 10-40 mL, Intracatheter, PRN, Phillips Grout, MD .  sodium chloride 0.9 % injection 3 mL, 3 mL, Intravenous, Q12H, Phillips Grout, MD, 3 mL at 04/04/15 2148 .  vancomycin (VANCOCIN) IVPB 1000 mg/200 mL premix, 1,000 mg, Intravenous, Q8H, Phillips Grout, MD, 1,000 mg at 04/05/15 0003  Facility-Administered Medications Ordered in Other Encounters:  .  sodium chloride 0.9 % injection 10 mL, 10 mL, Intravenous, PRN, Volanda Napoleon, MD, 10 mL at 10/08/14 1406:  . antiseptic oral rinse  7 mL Mouth Rinse BID  . pantoprazole (PROTONIX) IV  40 mg Intravenous Q12H  . piperacillin-tazobactam (ZOSYN)  IV  3.375 g Intravenous 3 times per day  . sodium chloride  3 mL Intravenous Q12H  . vancomycin  1,000 mg Intravenous Q8H  :  No Known Allergies:  Family History  Problem Relation Age of Onset  . Colon cancer Neg Hx   :  Social History   Social History  . Marital Status: Married    Spouse Name: N/A  . Number of Children: N/A  . Years of Education: N/A   Occupational History  . Not on file.   Social History Main Topics  . Smoking status: Former Smoker -- 1.00 packs/day for 30 years    Types: Cigarettes    Quit date: 07/28/2014  . Smokeless tobacco: Never Used     Comment: quit 02- 2016  . Alcohol Use: No  . Drug Use: No  . Sexual Activity: No   Other Topics Concern  . Not on file   Social History Narrative  :  Pertinent items are noted in HPI.  Exam: Patient Vitals for the past 24 hrs:  BP Temp Temp src Pulse Resp SpO2 Weight  04/05/15 0512 137/88 mmHg 98.4 F (36.9 C) Oral 98  18 96 % 198 lb 13.7 oz (90.2 kg)  04/04/15 2111 138/87 mmHg 98.4 F (36.9 C) Oral 97 18 95 % -  04/04/15 1236 136/74 mmHg 97.8 F (36.6 C) Oral 87 16 97 % -  04/04/15 1100 140/76 mmHg - - - - - -  04/04/15 1040 (!) 135/91 mmHg - - - - - -   Well developed and well nourished white male. Head and neck exam shows some slight oral thrush. He has no adenopathy in the neck. Lungs are with some slight decrease over on the left base. He has no wheezes or rhonchi. Cardiac exam regular in rhythm with no murmurs, rubs or bruits. Abdomen is distended and slightly tense. Bowel  sounds are decreased. He has no guarding or rebound tenderness. He has no obvious palpable abdominal mass. There is no palpable hepatomegaly. Extremities shows no clubbing, cyanosis or edema. Has good strength in his extremities. Skin exam shows no rashes. Neurological exam is nonfocal.    Recent Labs  04/04/15 0239 04/05/15 0255  WBC 6.3 5.7  HGB 12.9* 12.0*  HCT 38.2* 37.0*  PLT 461* 398    Recent Labs  04/04/15 0239 04/05/15 0255  NA 138 140  K 3.6 3.2*  CL 103 104  CO2 22 25  GLUCOSE 112* 110*  BUN 16 12  CREATININE 0.70 0.61  CALCIUM 8.6* 8.4*    Blood smear review:  none   Pathology: None    Assessment and Plan:  Mr. Donald Fry is a 59 year old gentleman. He has metastatic colon cancer. His disease was resected from the right colon. He had satellite nodules. He had disease under the diaphragm. 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 He had 11 /17 nodes positive.   Again, his CEA has never been elevated.  I would not be surprised if this is a sign of progressive disease. He has done quite well. He's gained quite a bit awaits his first saw him. His performance status has improved dramatically.  I think it would be worthwhile getting an MRI of his abdomen to see of this shows [place] anything.  He looks at the ascites is back already. I probably would do another paracentesis.  If we have positive ascites, then I  think we could have the answer that this is progressive disease.  I think the next issue if he does have progressive disease is how to treat this. Again, the CT scans that he had done recently did not show any obvious tumor. 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 This can often be seen with adenocarcinomas that are signet ring cell type.  One possibility for him would be to see about setting him to a [facility] and seeing if the surgeons at [facility] can do HIPEC therapy on him. He's had a very good performance status so this might be an option.  We have not given him Avastin with chemotherapy. This also would be possible.  We will have to await the results from cytology. I of the cytology results are negative, then surgery is going to have to do some type of procedure to look inside the abdominal cavity to see if there is malignancy.  We spent about an hour with him. His wife and sister-in-law were with him.  Frederich Cha 1:5-7

## 2015-04-05 NOTE — Procedures (Signed)
US guided diagnostic/therapeutic left thoracentesis performed yielding 320 cc turbid, blood-tinged fluid. The fluid was sent to the lab for preordered studies. F/u CXR pending. No immediate complications. The pt has only trace right pleural effusion and minimal ascites (focal septated pocket in LLQ) noted on today's exam.

## 2015-04-05 NOTE — Progress Notes (Signed)
Nutrition Brief Note  Talked with Pharmacist following huddle this AM; he informed RD of possibility of TPN for pt.   Per chart review, pt had paracentesis yesterday (10/31) with 1.7 L removed and plan for thoracentesis today. Pt not in room at time of RD visit; will visit pt for full assessment and chart note tomorrow (11/2).  Surgery PA note today indicates TPN likely to start. Pt has been NPO since admission and unable to meet needs. Notes also indicate poor intakes for the past ~3 weeks. Pt with hx of stage 4 colon cancer with last chemo in September 2016.  Wt Readings from Last 15 Encounters:  04/05/15 198 lb 13.7 oz (90.2 kg)  03/31/15 196 lb (88.905 kg)  03/28/15 196 lb (88.905 kg)  03/23/15 198 lb (89.812 kg)  03/22/15 198 lb (89.812 kg)  02/22/15 197 lb (89.359 kg)  02/08/15 195 lb (88.451 kg)  01/24/15 193 lb (87.544 kg)  01/11/15 189 lb (85.73 kg)  12/29/14 186 lb (84.369 kg)  12/10/14 182 lb (82.555 kg)  12/08/14 181 lb 1.9 oz (82.155 kg)  11/24/14 176 lb (79.833 kg)  11/10/14 174 lb (78.926 kg)  10/20/14 164 lb (74.39 kg)   Per review, pt has been gaining weight over the past few months; likely fluid related and physical assessment will be done to determine muscle and fat wasting.  Estimated nutrition needs:  2300-2525 kcal 110-125 grams protein 2-2.3 L/day fluid      Jarome Matin, RD, LDN Inpatient Clinical Dietitian Pager # [phone] After hours/weekend pager # [phone]

## 2015-04-05 NOTE — Progress Notes (Signed)
Patient ID: Donald Fry, male   DOB: [birth year], 58 y.o.   MRN: [id number]     [facility]      [address] [place], [place] [address]    Phone: [phone] FAX: [phone]     Subjective: Still in a fair amount of pain. Felt better after paracentesis and abdomen was soft according to the patient, however, distended once again.  Had a BM yesterday, but no flatus.  The patient is afebrile with a normal white count.  Denies nausea or vomiting.    Objective:  Vital signs:  Filed Vitals:   04/04/15 1100 04/04/15 1236 04/04/15 2111 04/05/15 0512  BP: 140/76 136/74 138/87 137/88  Pulse:  87 97 98  Temp:  97.8 F (36.6 C) 98.4 F (36.9 C) 98.4 F (36.9 C)  TempSrc:  Oral Oral Oral  Resp:  _0 Height:      Weight:    90.2 kg (198 lb 13.7 oz)  SpO2:  97% 95% 96%    Last BM Date: 04/04/15  Intake/Output   Yesterday:  10/31 0701 - 11/01 0700 In: 605 [IV Piggyback:500] Out: 100 [Urine:100] This shift:    Physical Exam: General: Pt awake/alert/oriented x4 in no acute distress Abdomen: +BS, abdomen is distended.  TTP to upper abdomen without peritoneal signs.     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   Bile leak, postoperative   Colon cancer metastasized to multiple sites [facility])   Ascites   Abdominal distension    Results:   Labs: Results for orders placed or performed during the hospital encounter of 03/23/2015 (from the past 48 hour(s))  CBC WITH DIFFERENTIAL     Status: Abnormal   Collection Time: 03/21/2015  6:50 PM  Result Value Ref Range   WBC 5.8 4.0 - 10.5 K/uL   RBC 4.08 (L) 4.22 - 5.81 MIL/uL   Hemoglobin 13.4 13.0 - 17.0 g/dL   HCT 41.8 39.0 - 52.0 %   MCV 102.5 (H) 78.0 - 100.0 fL   MCH 32.8 26.0 - 34.0 pg   MCHC 32.1 30.0 - 36.0 g/dL   RDW 14.2 11.5 - 15.5 %   Platelets  505 (H) 150 - 400 K/uL   Neutrophils Relative % 64 %   Neutro Abs 3.7 1.7 - 7.7 K/uL   Lymphocytes Relative 14 %   Lymphs Abs 0.8 0.7 - 4.0 K/uL   Monocytes Relative 20 %   Monocytes Absolute 1.1 (H) 0.1 - 1.0 K/uL   Eosinophils Relative 2 %   Eosinophils Absolute 0.1 0.0 - 0.7 K/uL   Basophils Relative 1 %   Basophils Absolute 0.0 0.0 - 0.1 K/uL   Smear Review MORPHOLOGY UNREMARKABLE   Comprehensive metabolic panel     Status: Abnormal   Collection Time: 03/10/2015  6:50 PM  Result Value Ref Range   Sodium 136 135 - 145 mmol/L   Potassium 3.7 3.5 - 5.1 mmol/L   Chloride 103 101 - 111 mmol/L   CO2 22 22 - 32 mmol/L   Glucose, Bld 105 (H) 65 - 99 mg/dL   BUN 17 6 - 20 mg/dL   Creatinine, Ser 0.66 0.61 - 1.24 mg/dL   Calcium 8.8 (L) 8.9 - 10.3 mg/dL   Total Protein 7.2 6.5 - 8.1 g/dL   Albumin 3.1 (L) 3.5 - 5.0  g/dL   AST 58 (H) 15 - 41 U/L   ALT 123 (H) 17 - 63 U/L   Alkaline Phosphatase 321 (H) 38 - 126 U/L   Total Bilirubin 1.2 0.3 - 1.2 mg/dL   GFR calc non Af Amer >60 >60 mL/min   GFR calc Af Amer >60 >60 mL/min    Comment: (NOTE) The eGFR has been calculated using the CKD EPI equation. This calculation has not been validated in all clinical situations. eGFR's persistently <60 mL/min signify possible Chronic Kidney Disease.    Anion gap 11 5 - 15  Lipase, blood     Status: Abnormal   Collection Time: 03/15/2015  6:50 PM  Result Value Ref Range   Lipase 63 (H) 11 - 51 U/L    Comment: Please note change in reference range.  Urinalysis, Routine w reflex microscopic (not at [facility])     Status: Abnormal   Collection Time: 03/16/2015  9:45 PM  Result Value Ref Range   Color, Urine AMBER (A) YELLOW    Comment: BIOCHEMICALS MAY BE AFFECTED BY COLOR   APPearance CLEAR CLEAR   Specific Gravity, Urine >1.046 (H) 1.005 - 1.030   pH 6.0 5.0 - 8.0   Glucose, UA NEGATIVE NEGATIVE mg/dL   Hgb urine dipstick NEGATIVE NEGATIVE   Bilirubin Urine MODERATE (A) NEGATIVE   Ketones, ur >80  (A) NEGATIVE mg/dL   Protein, ur 100 (A) NEGATIVE mg/dL   Urobilinogen, UA 1.0 0.0 - 1.0 mg/dL   Nitrite NEGATIVE NEGATIVE   Leukocytes, UA NEGATIVE NEGATIVE  Urine microscopic-add on     Status: Abnormal   Collection Time: 03/19/2015  9:45 PM  Result Value Ref Range   Squamous Epithelial / LPF RARE RARE   WBC, UA 0-2 <3 WBC/hpf   RBC / HPF 0-2 <3 RBC/hpf   Crystals CA OXALATE CRYSTALS (A) NEGATIVE   Urine-Other MUCOUS PRESENT   I-Stat CG4 Lactic Acid, ED     Status: None   Collection Time: 04/01/2015 11:15 PM  Result Value Ref Range   Lactic Acid, Venous 0.79 0.5 - 2.0 mmol/L  Lactic acid, plasma     Status: None   Collection Time: 04/04/15  2:38 AM  Result Value Ref Range   Lactic Acid, Venous 1.0 0.5 - 2.0 mmol/L  Comprehensive metabolic panel     Status: Abnormal   Collection Time: 04/04/15  2:39 AM  Result Value Ref Range   Sodium 138 135 - 145 mmol/L   Potassium 3.6 3.5 - 5.1 mmol/L   Chloride 103 101 - 111 mmol/L   CO2 22 22 - 32 mmol/L   Glucose, Bld 112 (H) 65 - 99 mg/dL   BUN 16 6 - 20 mg/dL   Creatinine, Ser 0.70 0.61 - 1.24 mg/dL   Calcium 8.6 (L) 8.9 - 10.3 mg/dL   Total Protein 6.9 6.5 - 8.1 g/dL   Albumin 2.8 (L) 3.5 - 5.0 g/dL   AST 58 (H) 15 - 41 U/L   ALT 116 (H) 17 - 63 U/L   Alkaline Phosphatase 325 (H) 38 - 126 U/L   Total Bilirubin 1.5 (H) 0.3 - 1.2 mg/dL   GFR calc non Af Amer >60 >60 mL/min   GFR calc Af Amer >60 >60 mL/min    Comment: (NOTE) The eGFR has been calculated using the CKD EPI equation. This calculation has not been validated in all clinical situations. eGFR's persistently <60 mL/min signify possible Chronic Kidney Disease.    Anion gap 13 5 -  15  CBC     Status: Abnormal   Collection Time: 04/04/15  2:39 AM  Result Value Ref Range   WBC 6.3 4.0 - 10.5 K/uL   RBC 3.77 (L) 4.22 - 5.81 MIL/uL   Hemoglobin 12.9 (L) 13.0 - 17.0 g/dL   HCT 38.2 (L) 39.0 - 52.0 %   MCV 101.3 (H) 78.0 - 100.0 fL   MCH 34.2 (H) 26.0 - 34.0 pg   MCHC 33.8  30.0 - 36.0 g/dL   RDW 14.5 11.5 - 15.5 %   Platelets 461 (H) 150 - 400 K/uL  Protime-INR     Status: Abnormal   Collection Time: 04/04/15  2:39 AM  Result Value Ref Range   Prothrombin Time 15.8 (H) 11.6 - 15.2 seconds   INR 1.24 0.00 - 1.49  Procalcitonin - Baseline     Status: None   Collection Time: 04/04/15  2:39 AM  Result Value Ref Range   Procalcitonin 0.33 ng/mL    Comment:        Interpretation: PCT (Procalcitonin) <= 0.5 ng/mL: Systemic infection (sepsis) is not likely. Local bacterial infection is possible. (NOTE)         ICU PCT Algorithm               Non ICU PCT Algorithm    ----------------------------     ------------------------------         PCT < 0.25 ng/mL                 PCT < 0.1 ng/mL     Stopping of antibiotics            Stopping of antibiotics       strongly encouraged.               strongly encouraged.    ----------------------------     ------------------------------       PCT level decrease by               PCT < 0.25 ng/mL       >= 80% from peak PCT       OR PCT 0.25 - 0.5 ng/mL          Stopping of antibiotics                                             encouraged.     Stopping of antibiotics           encouraged.    ----------------------------     ------------------------------       PCT level decrease by              PCT >= 0.25 ng/mL       < 80% from peak PCT        AND PCT >= 0.5 ng/mL            Continuin g antibiotics                                              encouraged.       Continuing antibiotics            encouraged.    ----------------------------     ------------------------------     PCT level  increase compared          PCT > 0.5 ng/mL         with peak PCT AND          PCT >= 0.5 ng/mL             Escalation of antibiotics                                          strongly encouraged.      Escalation of antibiotics        strongly encouraged.   Lactic acid, plasma     Status: None   Collection Time: 04/04/15  7:18 AM   Result Value Ref Range   Lactic Acid, Venous 0.8 0.5 - 2.0 mmol/L  C difficile quick scan w PCR reflex     Status: None   Collection Time: 04/04/15 10:26 AM  Result Value Ref Range   C Diff antigen NEGATIVE NEGATIVE   C Diff toxin NEGATIVE NEGATIVE   C Diff interpretation Negative for toxigenic C. difficile   Albumin, pleural or peritoneal fluid     Status: None   Collection Time: 04/04/15 11:12 AM  Result Value Ref Range   Albumin, Fluid 2.2 g/dL    Comment: (NOTE) No normal range established for this test Results should be evaluated in conjunction with serum values Performed at [facility]    Fluid Type-FALB ASCITIES   Lactate dehydrogenase (CSF, pleural or peritoneal fluid)     Status: Abnormal   Collection Time: 04/04/15 11:12 AM  Result Value Ref Range   LD, Fluid 472 (H) 3 - 23 U/L    Comment: (NOTE) Results should be evaluated in conjunction with serum values Performed at [facility]    Fluid Type-FLDH ASCITIES   Body fluid cell count with differential     Status: Abnormal   Collection Time: 04/04/15 11:12 AM  Result Value Ref Range   Fluid Type-FCT ASCITIES    Color, Fluid YELLOW YELLOW   Appearance, Fluid HAZY (A) CLEAR   WBC, Fluid 312 0 - 1000 cu mm   Neutrophil Count, Fluid 4 0 - 25 %   Lymphs, Fluid 53 %   Monocyte-Macrophage-Serous Fluid 42 (L) 50 - 90 %   Eos, Fluid 1 %   Other Cells, Fluid FEW %    Comment: OTHER CELLS IDENTIFIED AS MESOTHELIAL CELLS CORRELATE WITH CYTOLOGY.   Glucose, peritoneal fluid     Status: None   Collection Time: 04/04/15 11:12 AM  Result Value Ref Range   Glucose, Peritoneal Fluid 81 mg/dL    Comment: NO NORMAL RANGE ESTABLISHED FOR THIS TEST Performed at [facility]   Gram stain     Status: None   Collection Time: 04/04/15 11:13 AM  Result Value Ref Range   Specimen Description FLUID ASCITIC    Special Requests NONE    Gram Stain      FEW WBC PRESENT, PREDOMINANTLY MONONUCLEAR NO ORGANISMS  SEEN Performed at [facility]    Report Status 04/04/2015 FINAL   CBC     Status: Abnormal   Collection Time: 04/05/15  2:55 AM  Result Value Ref Range   WBC 5.7 4.0 - 10.5 K/uL   RBC 3.60 (L) 4.22 - 5.81 MIL/uL   Hemoglobin 12.0 (L) 13.0 - 17.0 g/dL   HCT 37.0 (L) 39.0 - 52.0 %  MCV 102.8 (H) 78.0 - 100.0 fL   MCH 33.3 26.0 - 34.0 pg   MCHC 32.4 30.0 - 36.0 g/dL   RDW 14.6 11.5 - 15.5 %   Platelets 398 150 - 400 K/uL  Basic metabolic panel     Status: Abnormal   Collection Time: 04/05/15  2:55 AM  Result Value Ref Range   Sodium 140 135 - 145 mmol/L   Potassium 3.2 (L) 3.5 - 5.1 mmol/L   Chloride 104 101 - 111 mmol/L   CO2 25 22 - 32 mmol/L   Glucose, Bld 110 (H) 65 - 99 mg/dL   BUN 12 6 - 20 mg/dL   Creatinine, Ser 0.61 0.61 - 1.24 mg/dL   Calcium 8.4 (L) 8.9 - 10.3 mg/dL   GFR calc non Af Amer >60 >60 mL/min   GFR calc Af Amer >60 >60 mL/min    Comment: (NOTE) The eGFR has been calculated using the CKD EPI equation. This calculation has not been validated in all clinical situations. eGFR's persistently <60 mL/min signify possible Chronic Kidney Disease.    Anion gap 11 5 - 15    Imaging / Studies: Ct Angio Chest Pe W/cm &/or Wo Cm  03/28/2015  CLINICAL DATA:  Increase shortness of breath for 2 days, diffuse abdominal pain and swelling with nausea, current history of colon cancer with right hemicolectomy, currently taking chemotherapy EXAM: CT ANGIOGRAPHY CHEST CT ABDOMEN AND PELVIS WITH CONTRAST TECHNIQUE: Multidetector CT imaging of the chest was performed using the standard protocol during bolus administration of intravenous contrast. Multiplanar CT image reconstructions and MIPs were obtained to evaluate the vascular anatomy. Multidetector CT imaging of the abdomen and pelvis was performed using the standard protocol during bolus administration of intravenous contrast. CONTRAST:  8mL OMNIPAQUE IOHEXOL 300 MG/ML SOLN, 145mL OMNIPAQUE IOHEXOL 350 MG/ML SOLN  COMPARISON:  03/22/2015 FINDINGS: CTA CHEST FINDINGS There is a moderate pleural effusion, which is new from the prior study. There is new left lower lobe consolidation which may represent atelectasis as a result of the effusion, although pneumonia or pneumonitis is not excluded. Peripheral consolidation in the lingula is unchanged and suggests atelectasis. On the right, there is a slight increase in a very small pleural effusion. There is consolidation in the inferior right middle lobe as well as in the right lower lobe, but these areas appear unchanged. Send The thoracic aorta shows no dissection or dilatation. There is no filling defect appreciated within the pulmonary arterial system. Oral contrast is within the tired esophagus suggesting stasis or reflux. Thoracic inlet is normal. Thyroid is normal. Right Port-A-Cath is noted. No pericardial effusion. CT ABDOMEN and PELVIS FINDINGS Stable nonenhancing low-attenuation liver lesions. Status post cholecystectomy. Mild hepatic steatosis. The anterior border of the pancreas appears mildly indistinct, but he pancreas otherwise appears normal. Spleen is normal. No adrenal lesions.  Kidneys are normal. Mild atherosclerotic calcification of the aortoiliac vessels. Bladder is normal. Reproductive organs negative. Status post right hemicolectomy. Anastomosis noted in the anterior midline. Nonobstructive bowel gas pattern. Colon is again relatively decompressed. Fluid-filled small bowel loops noted throughout the abdomen, well within normal limits at about 2.5 cm in diameter. There is again a tiny focus of gas along the anterior margin of the gastric antrum, very similar to the prior study. There is otherwise no evidence of pneumoperitoneum. There is an increase in the volume of ascites in the abdomen, now with a moderate volume of ascites in the abdomen and pelvis. Ascites intercalates between the right diaphragm  and the liver and appears somewhat organized in this  location, possibly involving the liver capsule. There is also somewhat organized ascites in the region of the gastrohepatic ligament, and a 3 cm possibly subcapsular collection of ascites along the posterior margin of the liver near the subhepatic space. There is ascites stone or pounding the lateral aspect of the spleen and intercalating between the left diaphragm and the spleen and was not. There is a small volume of ascites extending into the lesser sac. Periportal lymphadenopathy is unchanged. No no significant retroperitoneal adenopathy otherwise. No acute musculoskeletal findings. Review of the MIP images confirms the above findings. IMPRESSION: 1. Similar areas of scattered airspace disease in the right middle and lower lobe. This could represent atelectasis or pneumonia or pneumonitis. Small but slightly increased right pleural effusion. 2. Moderate left pleural effusion representing a change from the prior study. New left lower lobe consolidation could represent pneumonia/pneumonitis or atelectasis 3. No evidence of pulmonary embolism 4. Again identified is a tiny focus of gas adjacent to the gastric antrum. As previously described, this may represent a small gastric ulcer. 5. Although there is no other evidence of pneumoperitoneum, there has been a significant increase in the volume of ascites in the abdomen and upper pelvis. The ascites appears somewhat complex as described above suggesting the possibility of peritonitis. 6. Multiple small nonenhancing liver lesions stable. 7. Anterior margin of the pancreas appears somewhat indistinct. This could reflect pancreatitis, but the finding is equivocal and may be related to the presence of fluid in the lesser sac. 8. Relative to decompressed colon, numerous fluid-filled loops of small bowel are mildly prominent but still within normal limits. This likely reflects mild ileus. Critical Value/emergent results were called by telephone at the time of interpretation  on 03/19/2015 at 10:29 pm to Dr. Merrily Pew , who verbally acknowledged these results. Electronically Signed   By: Skipper Cliche M.D.   On: 03/08/2015 22:29   Ct Abdomen Pelvis W Contrast  03/09/2015  CLINICAL DATA:  Increase shortness of breath for 2 days, diffuse abdominal pain and swelling with nausea, current history of colon cancer with right hemicolectomy, currently taking chemotherapy EXAM: CT ANGIOGRAPHY CHEST CT ABDOMEN AND PELVIS WITH CONTRAST TECHNIQUE: Multidetector CT imaging of the chest was performed using the standard protocol during bolus administration of intravenous contrast. Multiplanar CT image reconstructions and MIPs were obtained to evaluate the vascular anatomy. Multidetector CT imaging of the abdomen and pelvis was performed using the standard protocol during bolus administration of intravenous contrast. CONTRAST:  58m OMNIPAQUE IOHEXOL 300 MG/ML SOLN, 1026mOMNIPAQUE IOHEXOL 350 MG/ML SOLN COMPARISON:  03/22/2015 FINDINGS: CTA CHEST FINDINGS There is a moderate pleural effusion, which is new from the prior study. There is new left lower lobe consolidation which may represent atelectasis as a result of the effusion, although pneumonia or pneumonitis is not excluded. Peripheral consolidation in the lingula is unchanged and suggests atelectasis. On the right, there is a slight increase in a very small pleural effusion. There is consolidation in the inferior right middle lobe as well as in the right lower lobe, but these areas appear unchanged. Send The thoracic aorta shows no dissection or dilatation. There is no filling defect appreciated within the pulmonary arterial system. Oral contrast is within the tired esophagus suggesting stasis or reflux. Thoracic inlet is normal. Thyroid is normal. Right Port-A-Cath is noted. No pericardial effusion. CT ABDOMEN and PELVIS FINDINGS Stable nonenhancing low-attenuation liver lesions. Status post cholecystectomy. Mild hepatic steatosis. The  anterior border of the pancreas appears mildly indistinct, but he pancreas otherwise appears normal. Spleen is normal. No adrenal lesions.  Kidneys are normal. Mild atherosclerotic calcification of the aortoiliac vessels. Bladder is normal. Reproductive organs negative. Status post right hemicolectomy. Anastomosis noted in the anterior midline. Nonobstructive bowel gas pattern. Colon is again relatively decompressed. Fluid-filled small bowel loops noted throughout the abdomen, well within normal limits at about 2.5 cm in diameter. There is again a tiny focus of gas along the anterior margin of the gastric antrum, very similar to the prior study. There is otherwise no evidence of pneumoperitoneum. There is an increase in the volume of ascites in the abdomen, now with a moderate volume of ascites in the abdomen and pelvis. Ascites intercalates between the right diaphragm and the liver and appears somewhat organized in this location, possibly involving the liver capsule. There is also somewhat organized ascites in the region of the gastrohepatic ligament, and a 3 cm possibly subcapsular collection of ascites along the posterior margin of the liver near the subhepatic space. There is ascites stone or pounding the lateral aspect of the spleen and intercalating between the left diaphragm and the spleen and was not. There is a small volume of ascites extending into the lesser sac. Periportal lymphadenopathy is unchanged. No no significant retroperitoneal adenopathy otherwise. No acute musculoskeletal findings. Review of the MIP images confirms the above findings. IMPRESSION: 1. Similar areas of scattered airspace disease in the right middle and lower lobe. This could represent atelectasis or pneumonia or pneumonitis. Small but slightly increased right pleural effusion. 2. Moderate left pleural effusion representing a change from the prior study. New left lower lobe consolidation could represent pneumonia/pneumonitis or  atelectasis 3. No evidence of pulmonary embolism 4. Again identified is a tiny focus of gas adjacent to the gastric antrum. As previously described, this may represent a small gastric ulcer. 5. Although there is no other evidence of pneumoperitoneum, there has been a significant increase in the volume of ascites in the abdomen and upper pelvis. The ascites appears somewhat complex as described above suggesting the possibility of peritonitis. 6. Multiple small nonenhancing liver lesions stable. 7. Anterior margin of the pancreas appears somewhat indistinct. This could reflect pancreatitis, but the finding is equivocal and may be related to the presence of fluid in the lesser sac. 8. Relative to decompressed colon, numerous fluid-filled loops of small bowel are mildly prominent but still within normal limits. This likely reflects mild ileus. Critical Value/emergent results were called by telephone at the time of interpretation on 03/19/2015 at 10:29 pm to Dr. Merrily Pew , who verbally acknowledged these results. Electronically Signed   By: Skipper Cliche M.D.   On: 04/01/2015 22:29   US Paracentesis  04/04/2015  INDICATION: Colon cancer, abdominal pain, ascites. Request is made for diagnostic and therapeutic paracentesis. EXAM: ULTRASOUND-GUIDED DIAGNOSTIC AND THERAPEUTIC PARACENTESIS COMPARISON:  None. MEDICATIONS: None. COMPLICATIONS: None immediate TECHNIQUE: Informed written consent was obtained from the patient after a discussion of the risks, benefits and alternatives to treatment. A timeout was performed prior to the initiation of the procedure. Initial ultrasound scanning demonstrates a small to moderate amount of ascites within the left lower abdominal quadrant. The left lower abdomen was prepped and draped in the usual sterile fashion. 1% lidocaine was used for local anesthesia. Under direct ultrasound guidance, a 19 gauge, 10-cm, Yueh catheter was introduced. An ultrasound image was saved for  documentation purposed. The paracentesis was performed. The catheter was removed and a dressing  was applied. The patient tolerated the procedure well without immediate post procedural complication. FINDINGS: A total of approximately 1.7 liters of slightly hazy, yellow fluid was removed. Samples were sent to the laboratory as requested by the clinical team. IMPRESSION: Successful ultrasound-guided diagnostic and therapeutic paracentesis yielding 1.7 liters of peritoneal fluid. Read by: Rowe Robert, PA-C Electronically Signed   By: Sandi Mariscal M.D.   On: 04/04/2015 11:31    Medications / Allergies:  Scheduled Meds: . antiseptic oral rinse  7 mL Mouth Rinse BID  . pantoprazole (PROTONIX) IV  40 mg Intravenous Q12H  . piperacillin-tazobactam (ZOSYN)  IV  3.375 g Intravenous 3 times per day  . sodium chloride  3 mL Intravenous Q12H  . vancomycin  1,000 mg Intravenous Q8H   Continuous Infusions:  PRN Meds:.diphenhydrAMINE, LORazepam, morphine injection, ondansetron **OR** ondansetron (ZOFRAN) IV, sodium chloride  Antibiotics: Anti-infectives    Start     Dose/Rate Route Frequency Ordered Stop   04/04/15 1400  vancomycin (VANCOCIN) IVPB 1000 mg/200 mL premix     1,000 mg 200 mL/hr over 60 Minutes Intravenous Every 8 hours 04/04/15 0308     04/04/15 1400  piperacillin-tazobactam (ZOSYN) IVPB 3.375 g     3.375 g 12.5 mL/hr over 240 Minutes Intravenous 3 times per day 04/04/15 0308     04/04/15 0115  vancomycin (VANCOCIN) IVPB 1000 mg/200 mL premix     1,000 mg 200 mL/hr over 60 Minutes Intravenous  Once 04/04/15 0100 04/04/15 0339   04/04/15 0115  piperacillin-tazobactam (ZOSYN) IVPB 3.375 g     3.375 g 100 mL/hr over 30 Minutes Intravenous  Once 04/04/15 0100 04/04/15 0309        Assessment/Plan Abdominal pain and ascites-It is not clear whether this is due to a GI perforation, enteritis, or malignancy. -s/p paracentesis which yielded 1.7L.  It appears to have re accumulated.  -cytology  is pending. -etiology still unclear, there are no indications for surgical intervention at this time.  Continue with antibiotics and bowel rest as he does have an ileus -Dr. Marin Olp is following.  Recommended MRI and repeat paracentesis, however, pt has a scheduled thoracentesis.   Question gastric ulcer-This was ruled out by endoscopy on 03/23/2015. Possibly could have developed an ulcer in the antrum, but seems less likely. No extravasation seen on double contrast CT. Advanced colon cancer, status post cholecystectomy and right colectomy and biopsy of peritoneal nodules on 07/30/2014.  Dr. Marin Olp is following.  Recommended paracentesis again.  Because if positive for ascites, then it would lean towards progressive disease and possible HIPEC tx at [facility].   Pleural effusion and possible left lower lobe pneumonia. 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     Erby Pian, [facility] Surgery Pager [phone]([id number])   04/05/2015 10:19 AM

## 2015-04-05 DEATH — deceased

## 2015-04-06 DIAGNOSIS — R140 Abdominal distension (gaseous): Secondary | ICD-10-CM | POA: Diagnosis present

## 2015-04-06 LAB — COMPREHENSIVE METABOLIC PANEL
ALBUMIN: 2.4 g/dL — AB (ref 3.5–5.0)
ALK PHOS: 283 U/L — AB (ref 38–126)
ALT: 69 U/L — AB (ref 17–63)
ANION GAP: 11 (ref 5–15)
AST: 37 U/L (ref 15–41)
BUN: 8 mg/dL (ref 6–20)
CHLORIDE: 101 mmol/L (ref 101–111)
CO2: 25 mmol/L (ref 22–32)
CREATININE: 0.58 mg/dL — AB (ref 0.61–1.24)
Calcium: 8.4 mg/dL — ABNORMAL LOW (ref 8.9–10.3)
GFR calc non Af Amer: >60 mL/min (ref >60)
GLUCOSE: 123 mg/dL — AB (ref 65–99)
Potassium: 3.5 mmol/L (ref 3.5–5.1)
SODIUM: 137 mmol/L (ref 135–145)
Total Bilirubin: 1.2 mg/dL (ref 0.3–1.2)
Total Protein: 6.1 g/dL — ABNORMAL LOW (ref 6.5–8.1)

## 2015-04-06 LAB — CBC
HEMATOCRIT: 36.7 % — AB (ref 39.0–52.0)
Hemoglobin: 12.1 g/dL — ABNORMAL LOW (ref 13.0–17.0)
MCH: 33.1 pg (ref 26.0–34.0)
MCHC: 33 g/dL (ref 30.0–36.0)
MCV: 100.3 fL — AB (ref 78.0–100.0)
Platelets: 399 10*3/uL (ref 150–400)
RBC: 3.66 MIL/uL — AB (ref 4.22–5.81)
RDW: 14.4 % (ref 11.5–15.5)
WBC: 7.4 10*3/uL (ref 4.0–10.5)

## 2015-04-06 LAB — PROCALCITONIN: Procalcitonin: 0.22 ng/mL

## 2015-04-06 LAB — VANCOMYCIN, TROUGH: VANCOMYCIN TR: 12 ug/mL (ref 10.0–20.0)

## 2015-04-06 LAB — PH, BODY FLUID: PH, BODY FLUID: 7.7

## 2015-04-06 LAB — LIPASE, BLOOD: Lipase: 52 U/L — ABNORMAL HIGH (ref 11–51)

## 2015-04-06 MED ORDER — METOCLOPRAMIDE HCL 5 MG/ML IJ SOLN
20.0000 mg | Freq: Four times a day (QID) | INTRAVENOUS | Status: DC
  Administered 2015-04-06 – 2015-04-12 (×27): 20 mg via INTRAVENOUS
  Filled 2015-04-06: qty 4
  Filled 2015-04-06: qty 2
  Filled 2015-04-06 (×17): qty 4
  Filled 2015-04-06: qty 2
  Filled 2015-04-06 (×7): qty 4
  Filled 2015-04-06: qty 2
  Filled 2015-04-06 (×3): qty 4

## 2015-04-06 MED ORDER — METRONIDAZOLE 500 MG PO TABS: 1000.0000 mg | ORAL_TABLET | Freq: Three times a day (TID) | ORAL | Status: DC

## 2015-04-06 MED ORDER — BISACODYL 5 MG PO TBEC: 20.0000 mg | DELAYED_RELEASE_TABLET | Freq: Once | ORAL | Status: DC

## 2015-04-06 MED ORDER — SODIUM CHLORIDE 0.9 % IV SOLN
1250.0000 mg | Freq: Three times a day (TID) | INTRAVENOUS | Status: DC
  Administered 2015-04-06 – 2015-04-07 (×2): 1250 mg via INTRAVENOUS
  Filled 2015-04-06 (×4): qty 1250

## 2015-04-06 MED ORDER — NEOMYCIN SULFATE 500 MG PO TABS: 1000.0000 mg | ORAL_TABLET | ORAL | Status: DC

## 2015-04-06 MED ORDER — POLYETHYLENE GLYCOL 3350 17 GM/SCOOP PO POWD
1.0000 | Freq: Once | ORAL | Status: DC
  Filled 2015-04-06: qty 255

## 2015-04-06 MED ORDER — POLYETHYLENE GLYCOL 3350 17 G PO PACK
17.0000 g | PACK | Freq: Once | ORAL | Status: AC
  Administered 2015-04-06: 17 g via ORAL
  Filled 2015-04-06: qty 1

## 2015-04-06 MED ORDER — BISACODYL 5 MG PO TBEC: 40.0000 mg | DELAYED_RELEASE_TABLET | Freq: Once | ORAL | Status: DC

## 2015-04-06 NOTE — Progress Notes (Signed)
Patient ID: Donald Fry, male   DOB: 12-05-1955, 59 y.o.   MRN: [id number]  [facility] PROGRESS NOTE  Donald Fry:[id number] DOB: [birth year] DOA: 03/20/2015 PCP: [name], MD   Brief narrative:    59 year old white male with known metastatic colon cancer, s/p resection back in March, on systemic chemotherapy presented for evaluation of abd pain several weeks in duration, has had recent upper endoscopy which was unremarkable. In [facility] ED, pt was noted to have ascites and has underwent paracentesis 1.7 L fluid was removed and due to concern of developing PNA pt was started on vancomycin and zosyn. CT chest was also notable for moderate left pleural effusion and IR consulted for thoracentesis.   Assessment/Plan:    Principal Problem:   Abdominal pain with ascites - unclear etiology . MRI abd with multiple loculated fluid collections suspicious for infected ascites, developing abscesses  - per surgery team and IR currently no indication for an intervention, continue with broad spectrum ABX - continue with PPI IV BID - s/p paracentesis 1.7 L fluid removed 10/31, abs still somewhat distended     Multifocal PNA, RML and RLL, LLL  - continue vanc and zosyn for now day #4 - also continue with stress dose steroids  - readjust the abx regimen based on clinical progress     Question gastric ulcer - ruled out by endoscopy on 03/23/2015.  - No extravasation seen on double contrast CT yesterday    ? Acute pancreatitis  - elevated lipase with imaging studies notable for it - lipase is trending down  - per surgery consider TNA - will reassess in AM    Hypokalemia - supplemented and WNL this AM     Oral thrush - continue Diflucan day #2    Thrombocytosis - reactive, now resolved     Advanced colon cancer - status post cholecystectomy and right colectomy and biopsy of peritoneal nodules on 07/30/2014. - Appreciate Dr. Antonieta Pert involvement.    Left pleural effusion -  thoracentesis done today, ~ 300 cc fluid removed 11/01, follow up on fluid analysis     Immunocompromised - due to chronic steroid use and moderate to severe protein calorie malnutrition - place on stress dose solumedrol 10 mg PO QD until pt able to take PO     Remote history of bile leak following cholecystectomy - treated with stent which was subsequently removed.    Ileus - monitor for now    Abnormal LFTs - unclear etiology, slowly trending down   DVT prophylaxis - SCD's  Code Status: Full.  Family Communication:  plan of care discussed with the patient Disposition Plan: Home when stable.   IV access:  Peripheral IV  Procedures and diagnostic studies:    Dg Eye Foreign Body 04/05/2015 No evidence of metallic foreign body within the orbits.   Dg Chest 1 View 04/05/2015  No pneumothorax following left thoracentesis. Small bilateral effusions with bibasilar atelectasis.   Ct Chest W ContrastCt Angio Chest Pe W/cm &/or Wo Cm 03/07/2015   Similar areas of scattered airspace disease in the right middle and lower lobe. This could represent atelectasis or pneumonia or pneumonitis. Small but slightly increased right pleural effusion. 2. Moderate left pleural effusion representing a change from the prior study. New left lower lobe consolidation could represent pneumonia/pneumonitis or atelectasis 3. No evidence of pulmonary embolism 4. Again identified is a tiny focus of gas adjacent to the gastric antrum. As previously described, this may represent a small gastric  ulcer. 5. Although there is no other evidence of pneumoperitoneum, there has been a significant increase in the volume of ascites in the abdomen and upper pelvis. The ascites appears somewhat complex as described above suggesting the possibility of peritonitis. 6. Multiple small nonenhancing liver lesions stable. 7. Anterior margin of the pancreas appears somewhat indistinct. This could reflect pancreatitis, but the finding is  equivocal and may be related to the presence of fluid in the lesser sac. 8. Relative to decompressed colon, numerous fluid-filled loops of small bowel are mildly prominent but still within normal limits. This likely reflects mild ileus.   Mr Abdomen W Wo Contrast 04/05/2015  Mild motion degradation. 2. Given this factor, no evidence of hepatic metastasis. 3. Multiple loculated fluid collections, including within the perihepatic space and left abdomen. These are suspicious for infected ascites. Developing abscess or abscesses cannot be excluded. 4. Bilateral pleural effusions with adjacent airspace disease. 5. Subtle pancreatic duct dilatation with an abrupt cut off in the region the pancreatic head. Although no dominant mass is seen in this area, there is hypo enhancement. Given limitations of the current exam, and comorbidities, consider follow-up with pancreatic protocol CT at 6-8 weeks (ideally after the patient is recovered from the acute episode) to exclude a non border deforming adenocarcinoma. 6. Apparent tiny filling defect in the portal vein could be artifactual, given absence of thrombus on the 03/09/2015 CT. Recommend attention on follow-up. 7. Mild prominence of small bowel loops, favoring adynamic ileus. 8. Upper abdominal adenopathy which could be reactive or metastatic.   US Paracentesis 04/04/2015  Successful ultrasound-guided diagnostic and therapeutic paracentesis yielding 1.7 liters of peritoneal fluid.   US Thoracentesis Asp Pleural Space W/img Guide 04/05/2015  Successful ultrasound-guided diagnostic and therapeutic left sided thoracentesis yielding 320 cc of pleural fluid.   Medical Consultants:  [facility] Oncology Surgery   Other Consultants:  None  IAnti-Infectives:   Vanc and Zosyn 10/31 -->  Faye Ramsay, MD  [facility] Pager [phone]  If 7PM-7AM, please contact night-coverage [email or web address] Password [email or web address] 04/06/2015, 5:03 PM   LOS: 3 days   HPI/Subjective: No events  overnight.   Objective: Filed Vitals:   04/05/15 1503 04/05/15 2137 04/06/15 0527 04/06/15 1216  BP: 129/81 141/93 128/90 145/92  Pulse:  108 92 98  Temp:  98 F (36.7 C) 97.6 F (36.4 C) 97.5 F (36.4 C)  TempSrc:  Oral Oral Oral  Resp:  18 18 20   Height:      Weight:   90.1 kg (198 lb 10.2 oz)   SpO2:  97% 97% 98%    Intake/Output Summary (Last 24 hours) at 04/06/15 1703 Last data filed at 04/06/15 1455  Gross per 24 hour  Intake    733 ml  Output   1000 ml  Net   -267 ml    Exam:   General:  Pt is alert, follows commands appropriately, not in acute distress  Cardiovascular: Regular rate and rhythm, no rubs, no gallops  Respiratory: Clear to auscultation bilaterally, crackles at bases   Abdomen: Soft, slightly tender in epigastric area, distended, no guarding  Extremities: pulses DP and PT palpable bilaterally  Data Reviewed: Basic Metabolic Panel:  Recent Labs Lab 03/31/15 0355 03/31/2015 1850 04/04/15 0239 04/05/15 0255 04/06/15 0405  NA 136 136 138 140 137  K 3.8 3.7 3.6 3.2* 3.5  CL 105 103 103 104 101  CO2 26 22 22 25 25   GLUCOSE 150* 105* 112* 110* 123*  BUN 19 17 16 12  8  CREATININE 0.89 0.66 0.70 0.61 0.58*  CALCIUM 8.6* 8.8* 8.6* 8.4* 8.4*   Liver Function Tests:  Recent Labs Lab 03/31/15 0355 03/15/2015 1850 04/04/15 0239 04/06/15 0405  AST 69* 58* 58* 37  ALT 96* 123* 116* 69*  ALKPHOS 209* 321* 325* 283*  BILITOT 0.9 1.2 1.5* 1.2  PROT 7.2 7.2 6.9 6.1*  ALBUMIN 3.1* 3.1* 2.8* 2.4*    Recent Labs Lab 03/31/15 0355 03/14/2015 1850 04/05/15 0255 04/06/15 0405  LIPASE 78* 63* 74* 52*   CBC:  Recent Labs Lab 03/31/15 0355 03/11/2015 1850 04/04/15 0239 04/05/15 0255 04/06/15 0405  WBC 9.7 5.8 6.3 5.7 7.4  NEUTROABS 8.0* 3.7  --   --   --   HGB 13.5 13.4 12.9* 12.0* 12.1*  HCT 42.2 41.8 38.2* 37.0* 36.7*  MCV 103.4* 102.5* 101.3* 102.8* 100.3*  PLT 421* 505* 461* 398 399     Recent Results (from the past 240 hour(s))   C difficile quick scan w PCR reflex     Status: None   Collection Time: 04/04/15 10:26 AM  Result Value Ref Range Status   C Diff antigen NEGATIVE NEGATIVE Final   C Diff toxin NEGATIVE NEGATIVE Final   C Diff interpretation Negative for toxigenic C. difficile  Final  Culture, body fluid-bottle     Status: None (Preliminary result)   Collection Time: 04/04/15 11:13 AM  Result Value Ref Range Status   Specimen Description FLUID ASCITIC  Final   Special Requests NONE  Final   Culture   Final    NO GROWTH 2 DAYS Performed at [facility]    Report Status PENDING  Incomplete  Gram stain     Status: None   Collection Time: 04/04/15 11:13 AM  Result Value Ref Range Status   Specimen Description FLUID ASCITIC  Final   Special Requests NONE  Final   Gram Stain   Final    FEW WBC PRESENT, PREDOMINANTLY MONONUCLEAR NO ORGANISMS SEEN Performed at [facility]    Report Status 04/04/2015 FINAL  Final  Culture, body fluid-bottle     Status: None (Preliminary result)   Collection Time: 04/05/15  3:28 PM  Result Value Ref Range Status   Specimen Description FLUID PLEURAL  Final   Special Requests BOTTLES DRAWN AEROBIC AND ANAEROBIC 10CC  Final   Culture   Final    NO GROWTH < 24 HOURS Performed at [facility]    Report Status PENDING  Incomplete  Gram stain     Status: None   Collection Time: 04/05/15  3:28 PM  Result Value Ref Range Status   Specimen Description FLUID PLEURAL  Final   Special Requests NONE  Final   Gram Stain   Final    CYTOSPIN SMEAR WBC PRESENT,BOTH PMN AND MONONUCLEAR NO ORGANISMS SEEN Performed at [facility]    Report Status 04/05/2015 FINAL  Final     Scheduled Meds: . antiseptic oral rinse  7 mL Mouth Rinse BID  . fluconazole (DIFLUCAN) IV  100 mg Intravenous Q24H  . magic mouthwash  10 mL Oral TID  . methylPREDNISolone (SOLU-MEDROL) injection  10 mg Intravenous Daily  . metoCLOPramide (REGLAN) injection  20 mg  Intravenous 4 times per day  . pantoprazole (PROTONIX) IV  40 mg Intravenous Q12H  . piperacillin-tazobactam (ZOSYN)  IV  3.375 g Intravenous 3 times per day  . sodium chloride  3 mL Intravenous Q12H  . vancomycin  1,250 mg Intravenous Q8H  Continuous Infusions:

## 2015-04-06 NOTE — Progress Notes (Signed)
Subjective: Overall he feels better.  Remains afebrile.  WBC 7400 Tolerating liquids without nausea Less dyspneic following thoracentesis Less abdominal pain but still tender on exam  Gram stain of left pleural fluid is negative.  Cytology pending Culture of peritoneal fluid is no growth at 1 day.  Gram stain negative.  Discussed cytology with Dr. Tresa Moore and she is doing further stains.  I have reviewed the CT abdomen and MRI abdomen with Dr. Kathlene Cote  and Dr. Jobe Igo in radiology.  There are fluid collections, probably liver subcapsular right sub-diaphragmatic, fluid collection around left lobe of liver and perisplenic, and third separate collection in left paracolic gutter which is reduced following tap.  None of these are rim-enhancing.   none have gross characteristics of infection.  Dr., Kathlene Cote does not think there is any obvious radiologic evidence of infection.  Objective: Vital signs in last 24 hours: Temp:  [97.6 F (36.4 C)-98 F (36.7 C)] 97.6 F (36.4 C) (11/02 0527) Pulse Rate:  [92-108] 92 (11/02 0527) Resp:  [18] 18 (11/02 0527) BP: (128-147)/(81-93) 128/90 mmHg (11/02 0527) SpO2:  [97 %] 97 % (11/02 0527) Weight:  [90.1 kg (198 lb 10.2 oz)] 90.1 kg (198 lb 10.2 oz) (11/02 0527) Last BM Date: 04/04/15  Intake/Output from previous day: 11/01 0701 - 11/02 0700 In: 815 [I.V.:10; IV Piggyback:750] Out: 350 [Urine:350] Intake/Output this shift:    General appearance: Clearly looks more comfortable.  Mild distress from abdominal pain Resp: Clear anteriorly.  Decreased breath sounds at bases. GI: Abdomen is distended and quite full hypoactive bowel sounds.  No percussion tenderness.  Uncomfortable to press deeply into abdomen.  Doesn't seem to have peritonitis.  Lab Results:   Recent Labs  04/05/15 0255 04/06/15 0405  WBC 5.7 7.4  HGB 12.0* 12.1*  HCT 37.0* 36.7*  PLT 398 399   BMET  Recent Labs  04/05/15 0255 04/06/15 0405  NA 140 137  K 3.2* 3.5  CL  104 101  CO2 25 25  GLUCOSE 110* 123*  BUN 12 8  CREATININE 0.61 0.58*  CALCIUM 8.4* 8.4*   PT/INR  Recent Labs  04/04/15 0239  LABPROT 15.8*  INR 1.24   ABG No results for input(s): PHART, HCO3 in the last 72 hours.  Invalid input(s): PCO2, PO2  Studies/Results: Dg Eye Foreign Body  04/05/2015  CLINICAL DATA:  Metal working/exposure; clearance prior to MRI EXAM: ORBITS FOR FOREIGN BODY - 2 VIEW COMPARISON:  None. FINDINGS: There is no evidence of metallic foreign body within the orbits. No significant bone abnormality identified. IMPRESSION: No evidence of metallic foreign body within the orbits. Electronically Signed   By: Genia Del M.D.   On: 04/05/2015 13:23   Dg Chest 1 View  04/05/2015  CLINICAL DATA:  Post thoracentesis on the left. EXAM: CHEST 1 VIEW COMPARISON:  03/31/2015 FINDINGS: Right Port-A-Cath remains in place, unchanged. Small bilateral pleural effusions. No pneumothorax following left thoracentesis. Bibasilar opacities, likely atelectasis. Heart upper limits normal in size. IMPRESSION: No pneumothorax following left thoracentesis. Small bilateral effusions with bibasilar atelectasis. Electronically Signed   By: Rolm Baptise M.D.   On: 04/05/2015 15:30   Mr Abdomen W Wo Contrast  04/05/2015  CLINICAL DATA:  history colon cancer. Evaluate for liver metastasis. Paracentesis done yesterday. EXAM: MRI ABDOMEN WITHOUT AND WITH CONTRAST TECHNIQUE: Multiplanar multisequence MR imaging of the abdomen was performed both before and after the administration of intravenous contrast. CONTRAST:  7mL MULTIHANCE GADOBENATE DIMEGLUMINE 529 MG/ML IV SOLN COMPARISON:  CTs  of 04/02/2015 and 03/22/2015. FINDINGS: Mild motion degradation throughout. Lower chest: Small left greater than right pleural effusions. Adjacent airspace disease. Mild cardiomegaly. Hepatobiliary: Mildly heterogeneous hepatic steatosis. Arterial phase hyper enhancement within segments 2 and 3, possibly related to  altered per fusion from surrounding perihepatic fluid. Multiple well-circumscribed T2 hyperintense liver lesions which are most consistent with cysts. Example 12 mm in segment 7 (image 26, series 3). No suspicious intraparenchymal liver lesion identified. Multiple perihepatic fluid collections, including 1 surrounding the hepatic dome. Example at 6.9 x 3.5 cm (image 12, series 3). Cholecystectomy, without biliary ductal dilatation. Pancreas: Demonstrates subtle/borderline duct dilatation within the body and tail. (image 39, series 3). Also (image 68, series 902). This continues to the level of the pancreatic head/ neck region, where an abrupt transition to decompressed pancreatic duct is seen. suspicion of vague hypo enhancement in this area which measures 1.7 cm (image 68, series 901). No convincing evidence of peripancreatic inflammation. Spleen: Normal in size, without focal abnormality. Adrenals/Urinary Tract: Normal adrenal glands. Normal kidneys, without hydronephrosis. Stomach/Bowel: Grossly normal stomach. Small bowel loops which measure up to 2.5 cm. Normal caliber of the imaged colon. Vascular/Lymphatic: Normal caliber of the aorta and branch vessels. An apparent filling defect within the portal vein within the porta hepatis is tiny. (image 56, series 903 and (image 53, series 10. No correlate on the 03/11/2015 CT. Upper normal sized porta hepatis nodes, including at 1.3 cm (image 36, series 3). Retroperitoneal nodes are mildly enlarged, including a retrocaval node at 11 mm (image 40, series 3).) Other: Multiple other loculated abdominal fluid collections. Example within the anterior left side of the abdomen at 8.9 x 2.7 cm (image 47, series 3). This continues for an approximately 17.2 cm craniocaudal span.) Musculoskeletal: No acute osseous abnormality. IMPRESSION: 1. Mild motion degradation. 2. Given this factor, no evidence of hepatic metastasis. 3. Multiple loculated fluid collections, including within  the perihepatic space and left abdomen. These are suspicious for infected ascites. Developing abscess or abscesses cannot be excluded. 4. Bilateral pleural effusions with adjacent airspace disease. 5. Subtle pancreatic duct dilatation with an abrupt cut off in the region the pancreatic head. Although no dominant mass is seen in this area, there is hypo enhancement. Given limitations of the current exam, and comorbidities, consider follow-up with pancreatic protocol CT at 6-8 weeks (ideally after the patient is recovered from the acute episode) to exclude a non border deforming adenocarcinoma. 6. Apparent tiny filling defect in the portal vein could be artifactual, given absence of thrombus on the 03/14/2015 CT. Recommend attention on follow-up. 7. Mild prominence of small bowel loops, favoring adynamic ileus. 8. Upper abdominal adenopathy which could be reactive or metastatic. Recommend attention on follow-up. Electronically Signed   By: Abigail Miyamoto M.D.   On: 04/05/2015 15:21   US Paracentesis  04/04/2015  INDICATION: Colon cancer, abdominal pain, ascites. Request is made for diagnostic and therapeutic paracentesis. EXAM: ULTRASOUND-GUIDED DIAGNOSTIC AND THERAPEUTIC PARACENTESIS COMPARISON:  None. MEDICATIONS: None. COMPLICATIONS: None immediate TECHNIQUE: Informed written consent was obtained from the patient after a discussion of the risks, benefits and alternatives to treatment. A timeout was performed prior to the initiation of the procedure. Initial ultrasound scanning demonstrates a small to moderate amount of ascites within the left lower abdominal quadrant. The left lower abdomen was prepped and draped in the usual sterile fashion. 1% lidocaine was used for local anesthesia. Under direct ultrasound guidance, a 19 gauge, 10-cm, Yueh catheter was introduced. An ultrasound image was saved for  documentation purposed. The paracentesis was performed. The catheter was removed and a dressing was applied. The  patient tolerated the procedure well without immediate post procedural complication. FINDINGS: A total of approximately 1.7 liters of slightly hazy, yellow fluid was removed. Samples were sent to the laboratory as requested by the clinical team. IMPRESSION: Successful ultrasound-guided diagnostic and therapeutic paracentesis yielding 1.7 liters of peritoneal fluid. Read by: Rowe Robert, PA-C Electronically Signed   By: Sandi Mariscal M.D.   On: 04/04/2015 11:31   US Thoracentesis Asp Pleural Space W/img Guide  04/05/2015  INDICATION: Patient with history of colon cancer, ascites, bilateral small pleural effusions, left greater than right. Request is made for diagnostic and therapeutic left thoracentesis. EXAM: ULTRASOUND GUIDED DIAGNOSTIC AND THERAPEUTIC LEFT THORACENTESIS COMPARISON:  None. MEDICATIONS: None COMPLICATIONS: None immediate TECHNIQUE: Informed written consent was obtained from the patient after a discussion of the risks, benefits and alternatives to treatment. A timeout was performed prior to the initiation of the procedure. Initial ultrasound scanning demonstrates a small to moderate left pleural effusion. The lower chest was prepped and draped in the usual sterile fashion. 1% lidocaine was used for local anesthesia. An ultrasound image was saved for documentation purposes. A 6 Fr Safe-T-Centesis catheter was introduced. The thoracentesis was performed. The catheter was removed and a dressing was applied. The patient tolerated the procedure well without immediate post procedural complication. The patient was escorted to have an upright chest radiograph. FINDINGS: A total of approximately 320 cc of turbid, blood-tinged fluid was removed. Requested samples were sent to the laboratory. IMPRESSION: Successful ultrasound-guided diagnostic and therapeutic left sided thoracentesis yielding 320 cc of pleural fluid. Read by: Rowe Robert, PA-C Electronically Signed   By: Lucrezia Europe M.D.   On: 04/05/2015  16:50    Anti-infectives: Anti-infectives    Start     Dose/Rate Route Frequency Ordered Stop   04/06/15 1600  vancomycin (VANCOCIN) 1,250 mg in sodium chloride 0.9 % 250 mL IVPB     1,250 mg 166.7 mL/hr over 90 Minutes Intravenous Every 8 hours 04/06/15 0932     04/05/15 2200  piperacillin-tazobactam (ZOSYN) IVPB 3.375 g     3.375 g 12.5 mL/hr over 240 Minutes Intravenous 3 times per day 04/05/15 1811     04/05/15 1200  fluconazole (DIFLUCAN) IVPB 100 mg     100 mg 50 mL/hr over 60 Minutes Intravenous Every 24 hours 04/05/15 1026     04/04/15 1400  vancomycin (VANCOCIN) IVPB 1000 mg/200 mL premix  Status:  Discontinued     1,000 mg 200 mL/hr over 60 Minutes Intravenous Every 8 hours 04/04/15 0308 04/06/15 0932   04/04/15 1400  piperacillin-tazobactam (ZOSYN) IVPB 3.375 g  Status:  Discontinued     3.375 g 12.5 mL/hr over 240 Minutes Intravenous 3 times per day 04/04/15 0308 04/05/15 1811   04/04/15 0115  vancomycin (VANCOCIN) IVPB 1000 mg/200 mL premix     1,000 mg 200 mL/hr over 60 Minutes Intravenous  Once 04/04/15 0100 04/04/15 0339   04/04/15 0115  piperacillin-tazobactam (ZOSYN) IVPB 3.375 g     3.375 g 100 mL/hr over 30 Minutes Intravenous  Once 04/04/15 0100 04/04/15 0309      Assessment/Plan:  Abdominal pain and ascites. The cause of this is not clear. GI perforations seems a less likely giving character of fluid and the fact that he is not septic. No indication for acute surgical intervention at this point in time Agree with antibiotics and bowel rest Agree with double dose  proton pump inhibitors Await cytology and cultures I have ordered MiraLAX for constipation to see if that helps If he remains symptomatic from this fluid, I would think percutaneous drainage would be better than laparoscopy or laparotomy given the fact that these areas are walled off and not free-flowing ascites.    Question gastric ulcer. This was ruled out by endoscopy on 03/23/2015.  Possibly could have developed an ulcer in the antrum, but seems less likely. No extravasation seen on double contrast CT 10/31.  Advanced colon cancer, status post cholecystectomy and right colectomy and biopsy of peritoneal nodules on 07/30/2014. He had abdominal carcinomatosis and diaphragmatic nodules  at that time and it is possible that this may be progressive, although no bulky disease seen on CT. Appreciate Dr. Antonieta Pert involvement.  Pleural effusion and possible left lower lobe pneumonia. Currently on broad-spectrum antibiotics.  Await pleural fluid culture and cytology.  Immunocompromised due to chronic steroid use and moderate to severe protein calorie malnutrition Recommend TNA  Remote history of bile leak following cholecystectomy, treated with stent. Stent subsequently removed.  Abnormal LFTs. Etiology unclear.    LOS: 3 days    Saaya Procell M 04/06/2015

## 2015-04-06 NOTE — Progress Notes (Signed)
ANTIBIOTIC CONSULT NOTE - Follow-up  Pharmacy Consult for Vancomycin and Zosyn  Indication: pneumonia  No Known Allergies  Patient Measurements: Height: 5\' 5"  (165.1 cm) Weight: 198 lb 10.2 oz (90.1 kg) IBW/kg (Calculated) : 61.5 Adjusted Body Weight:   Vital Signs: Temp: 97.6 F (36.4 C) (11/02 0527) Temp Source: Oral (11/02 0527) BP: 128/90 mmHg (11/02 0527) Pulse Rate: 92 (11/02 0527) Intake/Output from previous day: 11/01 0701 - 11/02 0700 In: 815 [I.V.:10; IV Piggyback:750] Out: 350 [Urine:350] Intake/Output from this shift:    Labs:  Recent Labs  04/04/15 0239 04/05/15 0255 04/06/15 0405  WBC 6.3 5.7 7.4  HGB 12.9* 12.0* 12.1*  PLT 461* 398 399  CREATININE 0.70 0.61 0.58*   Estimated Creatinine Clearance: 100 mL/min (by C-G formula based on Cr of 0.58).  Recent Labs  04/06/15 0809  [facility] 12     Microbiology: Recent Results (from the past 720 hour(s))  C difficile quick scan w PCR reflex     Status: None   Collection Time: 04/04/15 10:26 AM  Result Value Ref Range Status   C Diff antigen NEGATIVE NEGATIVE Final   C Diff toxin NEGATIVE NEGATIVE Final   C Diff interpretation Negative for toxigenic C. difficile  Final  Culture, body fluid-bottle     Status: None (Preliminary result)   Collection Time: 04/04/15 11:13 AM  Result Value Ref Range Status   Specimen Description FLUID ASCITIC  Final   Special Requests NONE  Final   Culture   Final    NO GROWTH 1 DAY Performed at [facility]    Report Status PENDING  Incomplete  Gram stain     Status: None   Collection Time: 04/04/15 11:13 AM  Result Value Ref Range Status   Specimen Description FLUID ASCITIC  Final   Special Requests NONE  Final   Gram Stain   Final    FEW WBC PRESENT, PREDOMINANTLY MONONUCLEAR NO ORGANISMS SEEN Performed at [facility]    Report Status 04/04/2015 FINAL  Final  Gram stain     Status: None   Collection Time: 04/05/15  3:28 PM  Result Value  Ref Range Status   Specimen Description FLUID PLEURAL  Final   Special Requests NONE  Final   Gram Stain   Final    CYTOSPIN SMEAR WBC PRESENT,BOTH PMN AND MONONUCLEAR NO ORGANISMS SEEN Performed at [facility]    Report Status 04/05/2015 FINAL  Final    Medications:  Anti-infectives    Start     Dose/Rate Route Frequency Ordered Stop   04/05/15 2200  piperacillin-tazobactam (ZOSYN) IVPB 3.375 g     3.375 g 12.5 mL/hr over 240 Minutes Intravenous 3 times per day 04/05/15 1811     04/05/15 1200  fluconazole (DIFLUCAN) IVPB 100 mg     100 mg 50 mL/hr over 60 Minutes Intravenous Every 24 hours 04/05/15 1026     04/04/15 1400  vancomycin (VANCOCIN) IVPB 1000 mg/200 mL premix     1,000 mg 200 mL/hr over 60 Minutes Intravenous Every 8 hours 04/04/15 0308     04/04/15 1400  piperacillin-tazobactam (ZOSYN) IVPB 3.375 g  Status:  Discontinued     3.375 g 12.5 mL/hr over 240 Minutes Intravenous 3 times per day 04/04/15 0308 04/05/15 1811   04/04/15 0115  vancomycin (VANCOCIN) IVPB 1000 mg/200 mL premix     1,000 mg 200 mL/hr over 60 Minutes Intravenous  Once 04/04/15 0100 04/04/15 0339   04/04/15 0115  piperacillin-tazobactam (  ZOSYN) IVPB 3.375 g     3.375 g 100 mL/hr over 30 Minutes Intravenous  Once 04/04/15 0100 04/04/15 0309     Assessment: 59 yo male h/o stage 4 colon cancer s/p resection and chemo ended in September comes in with 3 weeks of progressive worsening abdominal pain and "bloating".  Pharmacy asked to dose Vancomycin and Zosyn for PNA.  04/04/2015 >> vanc >> 04/04/2015 >> zosyn >>  10/31 ascitic fluid (few WBC, no organism on GS): NGTD 10/31 C. Diff: neg/neg 11/1 pleural fluid:   Dose changes/drug level info:   11/2 0730 VT = 12 mcg/ml on 1gm q8h (prior to 6th dose)  Goal of Therapy:  Vancomycin trough level 15-20 mcg/ml  Zosyn based on renal function Appropriate antibiotic dosing for renal function; eradication of infection   Plan:  Day #3  antibiotics  Zosyn 3.375g IV Q8H infused over 4hrs.   Change vancomycin to 1250mg  IV q8h based on vancomycin trough this morning. New trough to be ~11mcg/ml  Based on age, concerned for accumulation so will need to recheck trough at new steady state on new regimen  Await culture results  Doreene Eland, PharmD, BCPS.   Pager: [phone] 04/06/2015 9:24 AM

## 2015-04-06 NOTE — Progress Notes (Signed)
Mr. Lamp might be feeling a little better. He had the thoracentesis yesterday. 320 mL of fluid was removed.  We still are awaiting the cytology on the ascites. The cultures so far negative. He tried to have some fluid taken off his abdomen yesterday but there was not much present.  He still is not eating much.  The MRI of his abdomen was fairly unremarkable. He was noted to have some fluid collections. His liver looked okay. Had a couple enlarged lymph nodes that are nonspecific. There is no obvious carcinomatosis that was appreciated.  His abdomen is still somewhat distended. I really cannot hear much in way of bowel sounds.  I suppose that he might have some ileus if he had pneumonia.  There is no obvious obstruction. It may not be a bad idea to try some Reglan on him.  His labs look a little bit better. His liver function tests are improving. His albumin is still quite low. I will check a prealbumin on him.  He is getting out of bed from what he says.  His heart assay how much he is having in the way of bowel movements.  On his physical exam, his vital signs are all stable. He is afebrile. Blood pressure 128/90. Oral exam shows some dry oral mucosa. I really don't see any thrush. His lungs are clear bilaterally. He has no wheezes. Cardiac exam regular rate and rhythm with no murmurs rubs or bruits. Abdomen is slightly distended. I still do not hear much in the way bowel sounds. There is no guarding or rebound tenderness. He has no obvious fluid wave. There is no obvious hepatomegaly. Extremities shows no clubbing, cyanosis or edema.  It is still unclear as to what is going on. I think if the cytology on his abdominal fluid is positive, then we might be able to "take the leap of faith" that we are dealing with malignancy and that he has to some degree peritoneal carcinomatosis. If so, this would cause an intestinal pseudoobstruction which would explain the lack of bowel sounds.  If the  cytology on the ascites is negative, I really think that he should have some type of laparoscopic procedure to see what might be going on and what might be causing this abdominal distention and ascites. I realize this would be very aggressive but he has been doing so well. His performance status have been excellent. I think it would be reasonable to put him through an invasive procedure to find out what might be going on.  I appreciate all the great care that he is getting.  Harriette Ohara 33:6

## 2015-04-06 NOTE — Progress Notes (Signed)
Initial Nutrition Assessment  DOCUMENTATION CODES:   Obesity unspecified  INTERVENTION:  - TPN per pharmacy, should this be decided on definitely - RD will continue to monitor for needs  NUTRITION DIAGNOSIS:   Increased nutrient needs related to catabolic illness, cancer and cancer related treatments as evidenced by estimated needs.  GOAL:   Patient will meet greater than or equal to 90% of their needs  MONITOR:   Diet advancement, Weight trends, Labs, Skin, I & O's  REASON FOR ASSESSMENT:   Other (Comment) (discussion with pharmacist 11/1)  ASSESSMENT:   59 yo male h/o stage 4 colon cancer s/p resection and chemo ended in September comes in with 3 weeks of progressive worsening abdominal pain and "bloating". Pt says he has been in the ED several times, was given something for constipation. However the pain and swelling just continued to worsen. He denies fevers. Has been nauseas and sometimes vomits. He says the pain has always been generalized, was never localized in the epigastrum. Nothing has relieved the pain despite pain meds he takes at home. No chills. The last several days his abdomen has gotten really big and he has been having some associated sob, no cough. No urinary symptoms. Pt transferred from [facility] for abnormal CT scan, and surgical evaluation with likely superimposed pna.  Pt seen after discussion with pharmacist yesterday revealing possible plan for TPN. Pt was out of room to thoracentesis yesterday at time of RD visit. BMI indicates obesity.  Spoke with pt and wife, who is at bedside. For the 2 weeks PTA pt was only able to take a few bites or sips of items at a time before feeling full, bloated. He had a good appetite and felt hungry but once he started eating he was unable to take more than these few bites. He does sometimes have acid reflux flares and this had caused him, a few times in the past 2 weeks, to have a little bit of regurgitation. Wife states  that this was a very small amount and that pt never had a formal episode of emesis.  Pt states that he had a few sips of lemonade earlier which caused him to belch. He states this actually felt good and relieved some abdominal pressure. He has been having some abdominal pain today.  Pt had thoracentesis with 320 mL removed yesterday (11/1) and paracentesis 10/30 which removed 1.7 L. Per notes, surgery considering TPN for pt; will continue to monitor for this and the rest of plan of care.  No muscle or fat wasting was noted at this time. Per wife and pt, his weight has remained fairly stable; confirmed by chart review which shows 3 lb weight gain in the past 2 months. Fluid weight may be masking lean body weight loss. Unable to confirm malnutrition at this time.  Pt is unable to meet needs at this time. Medications reviewed. Labs reviewed; creatinine low, Ca: 8.4 mg/dL.   Diet Order:  Diet NPO time specified Except for: Ice Chips  Skin:  Reviewed, no issues  Last BM:  10/31  Height:   Ht Readings from Last 1 Encounters:  04/04/15 5\' 5"  (1.651 m)    Weight:   Wt Readings from Last 1 Encounters:  04/06/15 198 lb 10.2 oz (90.1 kg)    Ideal Body Weight:  61.82 kg (kg)  BMI:  Body mass index is 33.05 kg/(m^2).  Estimated Nutritional Needs:   Kcal:  2300-2525  Protein:  110-125 grams  Fluid:  1.7-2 L/day  EDUCATION NEEDS:   No education needs identified at this time     Donald Fry, [place], [facility] Inpatient Clinical Dietitian Pager # [phone] After hours/weekend pager # [phone]

## 2015-04-07 ENCOUNTER — Inpatient Hospital Stay (HOSPITAL_COMMUNITY): Payer: Medicaid Other

## 2015-04-07 DIAGNOSIS — R180 Malignant ascites: Secondary | ICD-10-CM | POA: Diagnosis present

## 2015-04-07 LAB — CBC
HCT: 35.9 % — ABNORMAL LOW (ref 39.0–52.0)
HEMOGLOBIN: 11.5 g/dL — AB (ref 13.0–17.0)
MCH: 32.5 pg (ref 26.0–34.0)
MCHC: 32 g/dL (ref 30.0–36.0)
MCV: 101.4 fL — AB (ref 78.0–100.0)
Platelets: 434 10*3/uL — ABNORMAL HIGH (ref 150–400)
RBC: 3.54 MIL/uL — AB (ref 4.22–5.81)
RDW: 14.5 % (ref 11.5–15.5)
WBC: 7.7 10*3/uL (ref 4.0–10.5)

## 2015-04-07 LAB — COMPREHENSIVE METABOLIC PANEL
ALK PHOS: 299 U/L — AB (ref 38–126)
ALT: 69 U/L — AB (ref 17–63)
AST: 50 U/L — ABNORMAL HIGH (ref 15–41)
Albumin: 2.6 g/dL — ABNORMAL LOW (ref 3.5–5.0)
Anion gap: 12 (ref 5–15)
BILIRUBIN TOTAL: 1 mg/dL (ref 0.3–1.2)
BUN: 10 mg/dL (ref 6–20)
CALCIUM: 8.5 mg/dL — AB (ref 8.9–10.3)
CO2: 24 mmol/L (ref 22–32)
CREATININE: 0.78 mg/dL (ref 0.61–1.24)
Chloride: 102 mmol/L (ref 101–111)
Glucose, Bld: 113 mg/dL — ABNORMAL HIGH (ref 65–99)
Potassium: 3.1 mmol/L — ABNORMAL LOW (ref 3.5–5.1)
Sodium: 138 mmol/L (ref 135–145)
Total Protein: 6.3 g/dL — ABNORMAL LOW (ref 6.5–8.1)

## 2015-04-07 LAB — MAGNESIUM: MAGNESIUM: 2.1 mg/dL (ref 1.7–2.4)

## 2015-04-07 LAB — LIPASE, BLOOD: LIPASE: 72 U/L — AB (ref 11–51)

## 2015-04-07 LAB — PHOSPHORUS: Phosphorus: 3.1 mg/dL (ref 2.5–4.6)

## 2015-04-07 LAB — GLUCOSE, CAPILLARY: Glucose-Capillary: 120 mg/dL — ABNORMAL HIGH (ref 65–99)

## 2015-04-07 MED ORDER — POTASSIUM CHLORIDE 10 MEQ/50ML IV SOLN
10.0000 meq | INTRAVENOUS | Status: AC
  Administered 2015-04-07 (×5): 10 meq via INTRAVENOUS
  Filled 2015-04-07 (×6): qty 50

## 2015-04-07 MED ORDER — LACTULOSE 10 GM/15ML PO SOLN
10.0000 g | Freq: Three times a day (TID) | ORAL | Status: DC
  Filled 2015-04-07 (×5): qty 15

## 2015-04-07 MED ORDER — TRACE MINERALS CR-CU-MN-SE-ZN 10-1000-500-60 MCG/ML IV SOLN
INTRAVENOUS | Status: AC
  Administered 2015-04-07: 18:00:00 via INTRAVENOUS
  Filled 2015-04-07: qty 960

## 2015-04-07 MED ORDER — FAT EMULSION 20 % IV EMUL
240.0000 mL | INTRAVENOUS | Status: AC
  Administered 2015-04-07: 240 mL via INTRAVENOUS
  Filled 2015-04-07: qty 250

## 2015-04-07 MED ORDER — INSULIN ASPART 100 UNIT/ML ~~LOC~~ SOLN
0.0000 [IU] | Freq: Four times a day (QID) | SUBCUTANEOUS | Status: DC
  Administered 2015-04-08 – 2015-04-09 (×5): 2 [IU] via SUBCUTANEOUS
  Administered 2015-04-09 (×2): 1 [IU] via SUBCUTANEOUS
  Administered 2015-04-09: 2 [IU] via SUBCUTANEOUS
  Administered 2015-04-09: 1 [IU] via SUBCUTANEOUS
  Administered 2015-04-10: 2 [IU] via SUBCUTANEOUS

## 2015-04-07 NOTE — Progress Notes (Signed)
Mr. Akel still is not improving much.  We started Reglan on him yesterday. He still has not had much in way of bowel movements.  I'm not sure as to why these cytology results are not back yet. I would have to think that if they were positive for malignancy, they would have been back already.  His abdomen is more distended. I don't know if this is from fluid or this is from a pseudoobstruction that he might be developing.  I think it might be worthwhile trying to get an upper GI with small bowel follow-through to see what this shows.  He also is on morphine every couple hours. This definitely will slowdown abdominal transit. It would be nice to try one something different to help with pain.  He is out of bed a little bit.  I'm not sure he needs IV antibiotics a longer. Clinically there is no obvious pneumonia. All fluid cultures have been negative to date.  He's had no bleeding.  He's had no cough. There has been no shortness of breath.  He has had no headache.  Labs are pending today.  I appreciate surgery's input. I understand their hesitance of doing any type of operative procedure.  It is still not clear as to why he has these pockets of fluid in his abdomen.  On his physical exam, he is afebrile. His blood pressure is 150/95. His abdomen is distended. Abdomen is tight. Bowel sounds are minimal. He has no guarding or rebound tenderness. There is no obvious fluid wave. He has no obvious hepatosplenomegaly. Lungs are clear. Cardiac exam regular rate and rhythm with no murmurs rubs or bruits. Extremities shows no clubbing, cyanosis or edema  I'm just frustrated as to his lack of progress. He is not eating. He may need to have some form of parenteral nutrition given.  Again I think it might be helpful to see if a upper GI with small bowel follow-through might clarify the situation.  Ultimately, I still worry about him having malignancy as the etiology for all of his abdominal problems.  So far, no studies have been convincing for malignancy. This might be where surgery could help by doing some type of laparoscopic procedure to look in the abdominal cavity to see what might be going on.  I will try to speak with pathology today and see what the delay is on the fluid evaluation.  I do appreciate everybody's help.  Maybe, the Reglan will start working a little bit today.  Donald Fry

## 2015-04-07 NOTE — Care Management Note (Signed)
Case Management Note  Patient Details  Name: Donald Fry MRN: [id number] Date of Birth: 1955/09/01  Subjective/Objective:  Ileus/Adeno Ca,fluid collection. TPN,Drain. Noted NGT to be placed.                 Action/Plan:d/c plan home.   Expected Discharge Date:                  Expected Discharge Plan:  Home/Self Care  In-House Referral:     Discharge planning Services  CM Consult  Post Acute Care Choice:    Choice offered to:     DME Arranged:    DME Agency:     HH Arranged:    HH Agency:     Status of Service:  In process, will continue to follow  Medicare Important Message Given:    Date Medicare IM Given:    Medicare IM give by:    Date Additional Medicare IM Given:    Additional Medicare Important Message give by:     If discussed at [place] of Stay Meetings, dates discussed:    Additional Comments:  Dessa Phi, RN 04/07/2015, 3:30 PM

## 2015-04-07 NOTE — Progress Notes (Addendum)
PARENTERAL NUTRITION CONSULT NOTE - INITIAL  Pharmacy Consult for TPN Indication: Prolonged Ileus  No Known Allergies  Patient Measurements: Height: _0  (165.1 cm) Weight: 202 lb 9.6 oz (91.9 kg) IBW/kg (Calculated) : 61.5  Vital Signs: Temp: 98.2 F (36.8 C) (11/03 0523) Temp Source: Oral (11/03 0523) BP: 150/95 mmHg (11/03 0523) Pulse Rate: 96 (11/03 0523) Intake/Output from previous day: 11/02 0701 - 11/03 0700 In: 1180 [I.V.:10; IV Piggyback:1170] Out: 850 [Urine:850] Intake/Output from this shift: Total I/O In: -  Out: 300 [Urine:300]  Labs:  Recent Labs  04/05/15 0255 04/06/15 0405 04/07/15 0335  WBC 5.7 7.4 7.7  HGB 12.0* 12.1* 11.5*  HCT 37.0* 36.7* 35.9*  PLT 398 399 434*     Recent Labs  04/05/15 0255 04/06/15 0405 04/07/15 0735  NA 140 137 138  K 3.2* 3.5 3.1*  CL 104 101 102  CO2 _1 GLUCOSE 110* 123* 113*  BUN _2 CREATININE 0.61 0.58* 0.78  CALCIUM 8.4* 8.4* 8.5*  PROT  --  6.1* 6.3*  ALBUMIN  --  2.4* 2.6*  AST  --  37 50*  ALT  --  69* 69*  ALKPHOS  --  283* 299*  BILITOT  --  1.2 1.0   Estimated Creatinine Clearance: 101.1 mL/min (by C-G formula based on Cr of 0.78).   No results for input(s): GLUCAP in the last 72 hours.  Medical History: Past Medical History  Diagnosis Date  . Hypertension   . High cholesterol   . Anxiety   . Transfusion history     [facility] last of March 29-2016  . Colon cancer ([place]) 07/30/2014  . Colon cancer metastasized to multiple sites ([place]) 08/20/2014    Medications:  Infusions:    Insulin Requirements in the past 24 hours: none  Current Nutrition: NPO  IVF: none  Central access: Implanted Port (08/16/14) TPN start date: 11/3  ASSESSMENT                                                                                                          HPI: 26 yoM presented to [facility] ED on 10/30 and was admitted to [facility] on 10/31 with 3 weeks of progressive worsening abdominal pain and "bloating".  PMH significant for stage 4 colon cancer with extensive peritoneal disease s/p resection and chemo (last in September) with a plan to transition to oral maintenance chemotherapy, and remote hx of bile leak following cholecystectomy. No indication for surgery at this time. RD notes poor oral intake for ~3 weeks and has been unable to tolerate PO since admission.  Pharmacy is consulted to start TPN.  He will be high risk for refeeding with very limited PO intake over the last several weeks.  Significant events:   Today:   Glucose - WNL  Electrolytes - K low; Na, Mag and Phos WNL  Renal - SCr 0.78  LFTs - Alk phos, Lipase, AST, ALT all elevated and increasing. Tbili WNL.  TGs - pending  Prealbumin - pending  NUTRITIONAL GOALS  RD recs (11/2): 110-125 g protein/day, 2300-2525 Kcal/day  Clinimix 5/20 at a goal rate of 83 ml/hr + 20% fat emulsion at 10 ml/hr to provide: 100 g/day protein, 2233 Kcal/day. - Meets 91% of minimum protein needs and 97% of minimum Kcal needs.  This rate will maximize the use of a 2L bag and minimize waste.  If further protein supplementation is needed, may increase rate to 90-100 ml/hr.  PLAN                                                                                                                         Now:  KCl 35mq IV x6 runs  At 1800 today:  Start Clinimix E 5/20 at 40 ml/hr.  20% fat emulsion at 10 ml/hr.  Plan to advance as tolerated to the goal rate.  TPN to contain standard multivitamins and trace elements.  Add CBGs and sensitive SSI q6h.  TPN lab panels on Mondays & Thursdays.  F/u daily - CMET/Mag/Phos ordered for Friday, BMET ordered for Sat/Sun.  CGretta ArabPharmD, BCPS Pager [phone]/08/2014 10:18 AM

## 2015-04-07 NOTE — Progress Notes (Signed)
NUTRITION NOTE  Pt seen for full assessment yesterday (11/2). Will see for full follow-up tomorrow (11/4). Per pharmacy in rounds this AM, TPN to start tonight.  Monitor magnesium, potassium, and phosphorus daily for at least 3 days, MD to replete as needed, as pt is at risk for refeeding syndrome given prolonged period of inadequate PO intakes.  Labs reviewed; K: 3.1 mmol/L, Mg and Phos WDL at this time.    RD will continue to follow per protocol.   Jarome Matin, RD, LDN Inpatient Clinical Dietitian Pager # [phone] After hours/weekend pager # [phone]

## 2015-04-07 NOTE — Progress Notes (Signed)
Patient ID: Donald Fry, male   DOB: March 02, 1956, 59 y.o.   MRN: [id number]     [facility]      [address] [place], [place] [address]    Phone: [phone] FAX: [phone]     Subjective: Pt very weak.  Up in bathroom vomiting bile.  Afebrile VSS.  Continued abdominal pain, distention.  Not having BMs or passing flatus.  Although AXR today shows normal.   Objective:  Vital signs:  Filed Vitals:   04/06/15 0527 04/06/15 1216 04/06/15 2023 04/07/15 0523  BP: 128/90 145/92 150/95 150/95  Pulse: 92 98 109 96  Temp: 97.6 F (36.4 C) 97.5 F (36.4 C) 98.4 F (36.9 C) 98.2 F (36.8 C)  TempSrc: Oral Oral Oral Oral  Resp: 18 20 20 18   Height:      Weight: 90.1 kg (198 lb 10.2 oz)   91.9 kg (202 lb 9.6 oz)  SpO2: 97% 98% 98% 98%    Last BM Date: 04/04/15  Intake/Output   Yesterday:  11/02 0701 - 11/03 0700 In: 1180 [I.V.:10; IV Piggyback:1170] Out: 850 [Urine:850] This shift: I/O last 3 completed shifts: In: 1480 [I.V.:10; IV Piggyback:1470] Out: 1200 [Urine:1200] Total I/O In: -  Out: 300 [Urine:300]     Physical Exam: General: Pt awake/alert/oriented x4 in mild acute distress Abdomen: +BS, abdomen is distended, diffuse tenderness.     Problem List:   Principal Problem:   Abdominal pain Active Problems:   Essential hypertension   Hypokalemia   Cancer of transverse colon with hemorrhage s/p colectomy 07/30/2014   Bile leak, postoperative   Colon cancer metastasized to multiple sites [facility])   Ascites   Pneumonia involving left lung   Thrombocytosis (HCC)   Pleural effusion   Pancreatitis, acute   Oral thrush   S/P thoracentesis   Abdominal distension    Results:   Labs: Results for orders placed or performed during the hospital encounter of 03/19/2015 (from the past 48 hour(s))  PH, Body Fluid     Status: None   Collection Time: 04/05/15  3:20 PM  Result Value Ref Range   pH, Body Fluid 7.7 Not  Estab.    Comment: (NOTE) This test was developed and its performance characteristics determined by [facility]. It has not been cleared or approved by the Food and Drug Administration. Performed At: [facility] [place], [place] [id number] Lindon Romp MD [id number]    Source of Sample PLEURAL   Lactate dehydrogenase (CSF, pleural or peritoneal fluid)     Status: Abnormal   Collection Time: 04/05/15  3:27 PM  Result Value Ref Range   LD, Fluid 166 (H) 3 - 23 U/L    Comment: (NOTE) Results should be evaluated in conjunction with serum values Performed at [facility]    Fluid Type-FLDH PLEURAL     Comment: CORRECTED ON 11/01 AT 1557: PREVIOUSLY REPORTED AS Pleural, L  Protein, pleural or peritoneal fluid     Status: None   Collection Time: 04/05/15  3:27 PM  Result Value Ref Range   Total protein, fluid 3.2 g/dL    Comment: (NOTE) No normal range established for this test Results should be evaluated in conjunction with serum values Performed at [facility]    Fluid Type-FTP PLEURAL     Comment: CORRECTED ON 11/01 AT 1558: PREVIOUSLY REPORTED AS Pleural, L  Body fluid cell count with differential     Status: Abnormal  Collection Time: 04/05/15  3:27 PM  Result Value Ref Range   Fluid Type-FCT PLEURAL     Comment: CORRECTED ON 11/01 AT 1557: PREVIOUSLY REPORTED AS Pleural, L   Color, Fluid RED (A) YELLOW   Appearance, Fluid HAZY (A) CLEAR   WBC, Fluid 877 0 - 1000 cu mm   Neutrophil Count, Fluid 3 0 - 25 %   Lymphs, Fluid 52 %   Monocyte-Macrophage-Serous Fluid 42 (L) 50 - 90 %   Eos, Fluid 3 %   Other Cells, Fluid OTHER CELLS IDENTIFIED AS MESOTHELIAL CELLS %    Comment: CORRELATE WITH CYTOLOGY.  Glucose, pleural or peritoneal fluid     Status: None   Collection Time: 04/05/15  3:28 PM  Result Value Ref Range   Glucose, Fluid 106 mg/dL    Comment: (NOTE) No normal range established for this test Results should be evaluated in  conjunction with serum values Performed at [place]: CORRECTED ON 11/01 AT 1559: PREVIOUSLY REPORTED AS Pleural, L  Culture, body fluid-bottle     Status: None (Preliminary result)   Collection Time: 04/05/15  3:28 PM  Result Value Ref Range   Specimen Description FLUID PLEURAL    Special Requests BOTTLES DRAWN AEROBIC AND ANAEROBIC 10CC    Culture      NO GROWTH < 24 HOURS Performed at [facility]    Report Status PENDING   Gram stain     Status: None   Collection Time: 04/05/15  3:28 PM  Result Value Ref Range   Specimen Description FLUID PLEURAL    Special Requests NONE    Gram Stain      CYTOSPIN SMEAR WBC PRESENT,BOTH PMN AND MONONUCLEAR NO ORGANISMS SEEN Performed at [facility]    Report Status 04/05/2015 FINAL   Procalcitonin     Status: None   Collection Time: 04/06/15  4:05 AM  Result Value Ref Range   Procalcitonin 0.22 ng/mL    Comment:        Interpretation: PCT (Procalcitonin) <= 0.5 ng/mL: Systemic infection (sepsis) is not likely. Local bacterial infection is possible. (NOTE)         ICU PCT Algorithm               Non ICU PCT Algorithm    ----------------------------     ------------------------------         PCT < 0.25 ng/mL                 PCT < 0.1 ng/mL     Stopping of antibiotics            Stopping of antibiotics       strongly encouraged.               strongly encouraged.    ----------------------------     ------------------------------       PCT level decrease by               PCT < 0.25 ng/mL       >= 80% from peak PCT       OR PCT 0.25 - 0.5 ng/mL          Stopping of antibiotics  encouraged.     Stopping of antibiotics           encouraged.    ----------------------------     ------------------------------       PCT level decrease by              PCT >= 0.25 ng/mL       < 80% from peak PCT        AND PCT >= 0.5 ng/mL             Continuin g antibiotics                                              encouraged.       Continuing antibiotics            encouraged.    ----------------------------     ------------------------------     PCT level increase compared          PCT > 0.5 ng/mL         with peak PCT AND          PCT >= 0.5 ng/mL             Escalation of antibiotics                                          strongly encouraged.      Escalation of antibiotics        strongly encouraged.   Comprehensive metabolic panel     Status: Abnormal   Collection Time: 04/06/15  4:05 AM  Result Value Ref Range   Sodium 137 135 - 145 mmol/L   Potassium 3.5 3.5 - 5.1 mmol/L   Chloride 101 101 - 111 mmol/L   CO2 25 22 - 32 mmol/L   Glucose, Bld 123 (H) 65 - 99 mg/dL   BUN 8 6 - 20 mg/dL   Creatinine, Ser 0.58 (L) 0.61 - 1.24 mg/dL   Calcium 8.4 (L) 8.9 - 10.3 mg/dL   Total Protein 6.1 (L) 6.5 - 8.1 g/dL   Albumin 2.4 (L) 3.5 - 5.0 g/dL   AST 37 15 - 41 U/L   ALT 69 (H) 17 - 63 U/L   Alkaline Phosphatase 283 (H) 38 - 126 U/L   Total Bilirubin 1.2 0.3 - 1.2 mg/dL   GFR calc non Af Amer >60 >60 mL/min   GFR calc Af Amer >60 >60 mL/min    Comment: (NOTE) The eGFR has been calculated using the CKD EPI equation. This calculation has not been validated in all clinical situations. eGFR's persistently <60 mL/min signify possible Chronic Kidney Disease.    Anion gap 11 5 - 15  Lipase, blood     Status: Abnormal   Collection Time: 04/06/15  4:05 AM  Result Value Ref Range   Lipase 52 (H) 11 - 51 U/L    Comment: Please note change in reference range.  CBC     Status: Abnormal   Collection Time: 04/06/15  4:05 AM  Result Value Ref Range   WBC 7.4 4.0 - 10.5 K/uL   RBC 3.66 (L) 4.22 - 5.81 MIL/uL   Hemoglobin 12.1 (L) 13.0 - 17.0 g/dL   HCT 36.7 (L) 39.0 - 52.0 %   MCV 100.3 (  H) 78.0 - 100.0 fL   MCH 33.1 26.0 - 34.0 pg   MCHC 33.0 30.0 - 36.0 g/dL   RDW 14.4 11.5 - 15.5 %   Platelets 399 150 - 400 K/uL   Vancomycin, trough     Status: None   Collection Time: 04/06/15  8:09 AM  Result Value Ref Range   Vancomycin Tr 12 10.0 - 20.0 ug/mL  CBC     Status: Abnormal   Collection Time: 04/07/15  3:35 AM  Result Value Ref Range   WBC 7.7 4.0 - 10.5 K/uL   RBC 3.54 (L) 4.22 - 5.81 MIL/uL   Hemoglobin 11.5 (L) 13.0 - 17.0 g/dL   HCT 35.9 (L) 39.0 - 52.0 %   MCV 101.4 (H) 78.0 - 100.0 fL   MCH 32.5 26.0 - 34.0 pg   MCHC 32.0 30.0 - 36.0 g/dL   RDW 14.5 11.5 - 15.5 %   Platelets 434 (H) 150 - 400 K/uL  Comprehensive metabolic panel     Status: Abnormal   Collection Time: 04/07/15  7:35 AM  Result Value Ref Range   Sodium 138 135 - 145 mmol/L   Potassium 3.1 (L) 3.5 - 5.1 mmol/L   Chloride 102 101 - 111 mmol/L   CO2 24 22 - 32 mmol/L   Glucose, Bld 113 (H) 65 - 99 mg/dL   BUN 10 6 - 20 mg/dL   Creatinine, Ser 0.78 0.61 - 1.24 mg/dL   Calcium 8.5 (L) 8.9 - 10.3 mg/dL   Total Protein 6.3 (L) 6.5 - 8.1 g/dL   Albumin 2.6 (L) 3.5 - 5.0 g/dL   AST 50 (H) 15 - 41 U/L   ALT 69 (H) 17 - 63 U/L   Alkaline Phosphatase 299 (H) 38 - 126 U/L   Total Bilirubin 1.0 0.3 - 1.2 mg/dL   GFR calc non Af Amer >60 >60 mL/min   GFR calc Af Amer >60 >60 mL/min    Comment: (NOTE) The eGFR has been calculated using the CKD EPI equation. This calculation has not been validated in all clinical situations. eGFR's persistently <60 mL/min signify possible Chronic Kidney Disease.    Anion gap 12 5 - 15  Lipase, blood     Status: Abnormal   Collection Time: 04/07/15  7:35 AM  Result Value Ref Range   Lipase 72 (H) 11 - 51 U/L    Comment: Please note change in reference range.    Imaging / Studies: Dg Eye Foreign Body  04/05/2015  CLINICAL DATA:  Metal working/exposure; clearance prior to MRI EXAM: ORBITS FOR FOREIGN BODY - 2 VIEW COMPARISON:  None. FINDINGS: There is no evidence of metallic foreign body within the orbits. No significant bone abnormality identified. IMPRESSION: No evidence of metallic foreign  body within the orbits. Electronically Signed   By: Genia Del M.D.   On: 04/05/2015 13:23   Dg Chest 1 View  04/05/2015  CLINICAL DATA:  Post thoracentesis on the left. EXAM: CHEST 1 VIEW COMPARISON:  03/31/2015 FINDINGS: Right Port-A-Cath remains in place, unchanged. Small bilateral pleural effusions. No pneumothorax following left thoracentesis. Bibasilar opacities, likely atelectasis. Heart upper limits normal in size. IMPRESSION: No pneumothorax following left thoracentesis. Small bilateral effusions with bibasilar atelectasis. Electronically Signed   By: Rolm Baptise M.D.   On: 04/05/2015 15:30   Mr Abdomen W Wo Contrast  04/05/2015  CLINICAL DATA:  history colon cancer. Evaluate for liver metastasis. Paracentesis done yesterday. EXAM: MRI ABDOMEN WITHOUT AND WITH CONTRAST TECHNIQUE:  Multiplanar multisequence MR imaging of the abdomen was performed both before and after the administration of intravenous contrast. CONTRAST:  45m MULTIHANCE GADOBENATE DIMEGLUMINE 529 MG/ML IV SOLN COMPARISON:  CTs of 03/30/2015 and 03/22/2015. FINDINGS: Mild motion degradation throughout. Lower chest: Small left greater than right pleural effusions. Adjacent airspace disease. Mild cardiomegaly. Hepatobiliary: Mildly heterogeneous hepatic steatosis. Arterial phase hyper enhancement within segments 2 and 3, possibly related to altered per fusion from surrounding perihepatic fluid. Multiple well-circumscribed T2 hyperintense liver lesions which are most consistent with cysts. Example 12 mm in segment 7 (image 26, series 3). No suspicious intraparenchymal liver lesion identified. Multiple perihepatic fluid collections, including 1 surrounding the hepatic dome. Example at 6.9 x 3.5 cm (image 12, series 3). Cholecystectomy, without biliary ductal dilatation. Pancreas: Demonstrates subtle/borderline duct dilatation within the body and tail. (image 39, series 3). Also (image 68, series 902). This continues to the level of the  pancreatic head/ neck region, where an abrupt transition to decompressed pancreatic duct is seen. suspicion of vague hypo enhancement in this area which measures 1.7 cm (image 68, series 901). No convincing evidence of peripancreatic inflammation. Spleen: Normal in size, without focal abnormality. Adrenals/Urinary Tract: Normal adrenal glands. Normal kidneys, without hydronephrosis. Stomach/Bowel: Grossly normal stomach. Small bowel loops which measure up to 2.5 cm. Normal caliber of the imaged colon. Vascular/Lymphatic: Normal caliber of the aorta and branch vessels. An apparent filling defect within the portal vein within the porta hepatis is tiny. (image 56, series 903 and (image 53, series 10. No correlate on the 04/02/2015 CT. Upper normal sized porta hepatis nodes, including at 1.3 cm (image 36, series 3). Retroperitoneal nodes are mildly enlarged, including a retrocaval node at 11 mm (image 40, series 3).) Other: Multiple other loculated abdominal fluid collections. Example within the anterior left side of the abdomen at 8.9 x 2.7 cm (image 47, series 3). This continues for an approximately 17.2 cm craniocaudal span.) Musculoskeletal: No acute osseous abnormality. IMPRESSION: 1. Mild motion degradation. 2. Given this factor, no evidence of hepatic metastasis. 3. Multiple loculated fluid collections, including within the perihepatic space and left abdomen. These are suspicious for infected ascites. Developing abscess or abscesses cannot be excluded. 4. Bilateral pleural effusions with adjacent airspace disease. 5. Subtle pancreatic duct dilatation with an abrupt cut off in the region the pancreatic head. Although no dominant mass is seen in this area, there is hypo enhancement. Given limitations of the current exam, and comorbidities, consider follow-up with pancreatic protocol CT at 6-8 weeks (ideally after the patient is recovered from the acute episode) to exclude a non border deforming adenocarcinoma. 6.  Apparent tiny filling defect in the portal vein could be artifactual, given absence of thrombus on the 03/22/2015 CT. Recommend attention on follow-up. 7. Mild prominence of small bowel loops, favoring adynamic ileus. 8. Upper abdominal adenopathy which could be reactive or metastatic. Recommend attention on follow-up. Electronically Signed   By: KAbigail MiyamotoM.D.   On: 04/05/2015 15:21   Dg Abd 2 Views  04/07/2015  CLINICAL DATA:  Constipation.  Abdominal pain. EXAM: ABDOMEN - 2 VIEW COMPARISON:  MRI 04/05/2015. Ultrasound 04/04/2015. CT 03/29/2015, 03/22/2015. Abdominal series 10 12/22/2014 . FINDINGS: Surgical clips right upper quadrant. Soft tissue structures unremarkable . No bowel distention noted. Decubitus views reveal no evidence of free air a along the upper lateral margin of the abdomen. Lucencies projected over the right lung base/ right upper abdomen are most likely within the lung and related to adjacent atelectasis. Continued follow-up  abdominal series suggested. If an upright view can be obtained this would be useful. No large stool burden. Degenerative changes lumbar spine. Small left pleural effusion . IMPRESSION: 1. No evidence of bowel distention. No definitive evidence of a free intraperitoneal air. Small lucencies noted projected over the right lung base/upper most portion of the right abdomen are most likely in the lung and related to adjacent atelectasis. Continued follow-up abdominal series suggested. 2.  Small left pleural effusion. Electronically Signed   By: Marcello Moores  Register   On: 04/07/2015 09:07   US Thoracentesis Asp Pleural Space W/img Guide  04/05/2015  INDICATION: Patient with history of colon cancer, ascites, bilateral small pleural effusions, left greater than right. Request is made for diagnostic and therapeutic left thoracentesis. EXAM: ULTRASOUND GUIDED DIAGNOSTIC AND THERAPEUTIC LEFT THORACENTESIS COMPARISON:  None. MEDICATIONS: None COMPLICATIONS: None immediate  TECHNIQUE: Informed written consent was obtained from the patient after a discussion of the risks, benefits and alternatives to treatment. A timeout was performed prior to the initiation of the procedure. Initial ultrasound scanning demonstrates a small to moderate left pleural effusion. The lower chest was prepped and draped in the usual sterile fashion. 1% lidocaine was used for local anesthesia. An ultrasound image was saved for documentation purposes. A 6 Fr Safe-T-Centesis catheter was introduced. The thoracentesis was performed. The catheter was removed and a dressing was applied. The patient tolerated the procedure well without immediate post procedural complication. The patient was escorted to have an upright chest radiograph. FINDINGS: A total of approximately 320 cc of turbid, blood-tinged fluid was removed. Requested samples were sent to the laboratory. IMPRESSION: Successful ultrasound-guided diagnostic and therapeutic left sided thoracentesis yielding 320 cc of pleural fluid. Read by: Rowe Robert, PA-C Electronically Signed   By: Lucrezia Europe M.D.   On: 04/05/2015 16:50    Medications / Allergies:  Scheduled Meds: . antiseptic oral rinse  7 mL Mouth Rinse BID  . lactulose  10 g Oral TID  . magic mouthwash  10 mL Oral TID  . methylPREDNISolone (SOLU-MEDROL) injection  10 mg Intravenous Daily  . metoCLOPramide (REGLAN) injection  20 mg Intravenous 4 times per day  . pantoprazole (PROTONIX) IV  40 mg Intravenous Q12H  . sodium chloride  3 mL Intravenous Q12H   Continuous Infusions:  PRN Meds:.diphenhydrAMINE, LORazepam, morphine injection, ondansetron **OR** ondansetron (ZOFRAN) IV, sodium chloride  Antibiotics: Anti-infectives    Start     Dose/Rate Route Frequency Ordered Stop   04/07/15 1400  neomycin (MYCIFRADIN) tablet 1,000 mg  Status:  Discontinued     1,000 mg Oral 3 times per day on Thu 04/06/15 1353 04/06/15 1507   04/07/15 1400  metroNIDAZOLE (FLAGYL) tablet 1,000 mg  Status:   Discontinued     1,000 mg Oral 3 times daily 04/06/15 1353 04/06/15 1507   04/06/15 1600  vancomycin (VANCOCIN) 1,250 mg in sodium chloride 0.9 % 250 mL IVPB  Status:  Discontinued     1,250 mg 166.7 mL/hr over 90 Minutes Intravenous Every 8 hours 04/06/15 0932 04/07/15 0657   04/05/15 2200  piperacillin-tazobactam (ZOSYN) IVPB 3.375 g  Status:  Discontinued     3.375 g 12.5 mL/hr over 240 Minutes Intravenous 3 times per day 04/05/15 1811 04/07/15 0657   04/05/15 1200  fluconazole (DIFLUCAN) IVPB 100 mg  Status:  Discontinued     100 mg 50 mL/hr over 60 Minutes Intravenous Every 24 hours 04/05/15 1026 04/07/15 0657   04/04/15 1400  vancomycin (VANCOCIN) IVPB 1000 mg/200 mL premix  Status:  Discontinued     1,000 mg 200 mL/hr over 60 Minutes Intravenous Every 8 hours 04/04/15 0308 04/06/15 0932   04/04/15 1400  piperacillin-tazobactam (ZOSYN) IVPB 3.375 g  Status:  Discontinued     3.375 g 12.5 mL/hr over 240 Minutes Intravenous 3 times per day 04/04/15 0308 04/05/15 1811   04/04/15 0115  vancomycin (VANCOCIN) IVPB 1000 mg/200 mL premix     1,000 mg 200 mL/hr over 60 Minutes Intravenous  Once 04/04/15 0100 04/04/15 0339   04/04/15 0115  piperacillin-tazobactam (ZOSYN) IVPB 3.375 g     3.375 g 100 mL/hr over 30 Minutes Intravenous  Once 04/04/15 0100 04/04/15 0309       Assessment/Plan: Abdominal pain and ascites. The cause of this is not clear. GI perforations seems a less likely giving character of fluid and the fact that he is not septic. -No indication for acute surgical intervention at this point in time. These areas are walled off and not free flowing therefore a laparoscopy or laparotomy would not be beneficial.  We will ask IR to evaluate drain placement to intra-abdominal fluid collections. place an NGT to [facility] for continued nausea and vomiting Await cytology and cultures---Dr. Tresa Moore to finalize reviewing the peritoneal cytology, but peritoneal fluid had malignant cells.  She  has placed a call to Dr. Marin Olp.  Furthermore, stating that pleural fluid had more malignant cells.  I will update Dr. Doyle Askew.  Question gastric ulcer. This was ruled out by endoscopy on 03/23/2015. Possibly could have developed an ulcer in the antrum, but seems less likely. No extravasation seen on double contrast CT 10/31.  Advanced colon cancer, status post cholecystectomy and right colectomy and biopsy of peritoneal nodules on 07/30/2014. He had abdominal carcinomatosis and diaphragmatic nodules at that time and it is possible that this may be progressive, although no bulky disease seen on CT. Appreciate Dr. Antonieta Pert involvement.  Pleural effusion and possible left lower lobe pneumonia. Currently on broad-spectrum antibiotics. pleural fluid culture negative to date.  Awaiting cytology.   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  Discussed with Dr. Doyle Askew who will order TPN.   Remote history of bile leak following cholecystectomy, treated with stent. Stent subsequently removed.  Abnormal LFTs. Etiology unclear.  Erby Pian, [facility] Pager [phone])   04/07/2015 9:57 AM

## 2015-04-07 NOTE — Progress Notes (Signed)
Patient ID: Donald Fry, male   DOB: 11/09/1955, 59 y.o.   MRN: [id number] Request received for additional paracentesis/possible abd fluid drainage on pt. Last paracentesis done 10/31 yielded 1.7 liters and limited US abd during most recent thoracentesis on 11/1 revealed only small septated pocket of ascites  in LLQ. Prelim cytology malignant from both specimens- final path pend. Images were reviewed by Dr. Vernard Gambles and he feels there is no role for drainage cath placement into small collections noted, especially if malignant which may necessitate prolonged need for drain . Would also hold off on paracentesis until substantial amount of free fluid noted to warrant a therapeutic tap.

## 2015-04-07 NOTE — Progress Notes (Signed)
Patient ID: Donald Fry, male   DOB: [birth year], 59 y.o.   MRN: [id number]  [facility] PROGRESS NOTE  IZAYA NETHERTON HYI:[id number] DOB: 05-07-1956 DOA: 03/16/2015 PCP: [name], MD   Brief narrative:    59 year old white male with known metastatic colon cancer, s/p resection back in March, on systemic chemotherapy presented for evaluation of abd pain several weeks in duration, has had recent upper endoscopy which was unremarkable. In [facility] ED, pt was noted to have ascites and has underwent paracentesis 1.7 L fluid was removed and due to concern of developing PNA pt was started on vancomycin and zosyn. CT chest was also notable for moderate left pleural effusion and IR consulted for thoracentesis.   Assessment/Plan:    Principal Problem:   Abdominal pain with ascites - unclear etiology . MRI abd with multiple loculated fluid collections suspicious for infected ascites, developing abscesses  - per surgery team and IR currently no indication for an intervention, ABX discontinued as no further convincing evidence of an infectious etiology  - continue with PPI IV BID - s/p paracentesis 1.7 L fluid removed 10/31, abs still somewhat distended  - d/w IR, ? Plan for placing drain - plan on TPN    Multifocal PNA, RML and RLL, LLL  - continue vanc and zosyn for now day #5/5, no further signs of an infectious etiology, ABX stopped  - also continue with stress dose steroids     Question gastric ulcer - ruled out by endoscopy on 03/23/2015.  - No extravasation seen on double contrast CT yesterday    ? Acute pancreatitis  - elevated lipase with imaging studies notable for it - per oncology, not consistent with pancreatitis  - lipase is still elevated     Hypokalemia - supplemented    Oral thrush - continue Diflucan day #3    Thrombocytosis - reactive, will monitor     Advanced colon cancer - status post cholecystectomy and right colectomy and biopsy of peritoneal nodules  on 07/30/2014. - ascitic fluid analysis cytology still pending  - Appreciate Dr. Antonieta Pert involvement.    Left pleural effusion - thoracentesis done today, ~ 300 cc fluid removed 11/01, fluid analysis still pending     Immunocompromised - due to chronic steroid use and moderate to severe protein calorie malnutrition - place on stress dose solumedrol 10 mg PO QD until pt able to take PO     Remote history of bile leak following cholecystectomy - treated with stent which was subsequently removed.    Ileus - monitor for now - abx xray this am with no signs of obstruction     Abnormal LFTs - unclear etiology, slowly trending down   DVT prophylaxis - SCD's  Code Status: Full.  Family Communication:  plan of care discussed with the patient Disposition Plan: Home when stable.   IV access:  Peripheral IV  Procedures and diagnostic studies:    Dg Eye Foreign Body 04/05/2015 No evidence of metallic foreign body within the orbits.   Dg Chest 1 View 04/05/2015  No pneumothorax following left thoracentesis. Small bilateral effusions with bibasilar atelectasis.   Ct Chest W ContrastCt Angio Chest Pe W/cm &/or Wo Cm 03/12/2015   Similar areas of scattered airspace disease in the right middle and lower lobe. This could represent atelectasis or pneumonia or pneumonitis. Small but slightly increased right pleural effusion. 2. Moderate left pleural effusion representing a change from the prior study. New left lower lobe consolidation could represent pneumonia/pneumonitis or  atelectasis 3. No evidence of pulmonary embolism 4. Again identified is a tiny focus of gas adjacent to the gastric antrum. As previously described, this may represent a small gastric ulcer. 5. Although there is no other evidence of pneumoperitoneum, there has been a significant increase in the volume of ascites in the abdomen and upper pelvis. The ascites appears somewhat complex as described above suggesting the possibility of  peritonitis. 6. Multiple small nonenhancing liver lesions stable. 7. Anterior margin of the pancreas appears somewhat indistinct. This could reflect pancreatitis, but the finding is equivocal and may be related to the presence of fluid in the lesser sac. 8. Relative to decompressed colon, numerous fluid-filled loops of small bowel are mildly prominent but still within normal limits. This likely reflects mild ileus.   Mr Abdomen W Wo Contrast 04/05/2015  Mild motion degradation. 2. Given this factor, no evidence of hepatic metastasis. 3. Multiple loculated fluid collections, including within the perihepatic space and left abdomen. These are suspicious for infected ascites. Developing abscess or abscesses cannot be excluded. 4. Bilateral pleural effusions with adjacent airspace disease. 5. Subtle pancreatic duct dilatation with an abrupt cut off in the region the pancreatic head. Although no dominant mass is seen in this area, there is hypo enhancement. Given limitations of the current exam, and comorbidities, consider follow-up with pancreatic protocol CT at 6-8 weeks (ideally after the patient is recovered from the acute episode) to exclude a non border deforming adenocarcinoma. 6. Apparent tiny filling defect in the portal vein could be artifactual, given absence of thrombus on the 03/06/2015 CT. Recommend attention on follow-up. 7. Mild prominence of small bowel loops, favoring adynamic ileus. 8. Upper abdominal adenopathy which could be reactive or metastatic.   US Paracentesis 04/04/2015  Successful ultrasound-guided diagnostic and therapeutic paracentesis yielding 1.7 liters of peritoneal fluid.   US Thoracentesis Asp Pleural Space W/img Guide 04/05/2015  Successful ultrasound-guided diagnostic and therapeutic left sided thoracentesis yielding 320 cc of pleural fluid.   Medical Consultants:  IR Oncology Surgery   Other Consultants:  None  IAnti-Infectives:   Vanc and Zosyn 10/30 -->  11/03  Faye Ramsay, MD  [facility] Pager [phone]  If 7PM-7AM, please contact night-coverage [email or web address] Password [facility] 04/07/2015, 6:48 AM   LOS: 4 days   HPI/Subjective: No events overnight.   Objective: Filed Vitals:   04/06/15 0527 04/06/15 1216 04/06/15 2023 04/07/15 0523  BP: 128/90 145/92 150/95 150/95  Pulse: 92 98 109 96  Temp: 97.6 F (36.4 C) 97.5 F (36.4 C) 98.4 F (36.9 C) 98.2 F (36.8 C)  TempSrc: Oral Oral Oral Oral  Resp: 18 20 20 18   Height:      Weight: 90.1 kg (198 lb 10.2 oz)   91.9 kg (202 lb 9.6 oz)  SpO2: 97% 98% 98% 98%    Intake/Output Summary (Last 24 hours) at 04/07/15 5462 Last data filed at 04/07/15 0021  Gross per 24 hour  Intake   1066 ml  Output    850 ml  Net    216 ml    Exam:   General:  Pt is alert, follows commands appropriately, not in acute distress  Cardiovascular: Regular rate and rhythm, no rubs, no gallops  Respiratory: Clear to auscultation bilaterally, diminished breath sounds at bases   Abdomen: Soft, slightly tender in epigastric area, distended, no guarding  Extremities: pulses DP and PT palpable bilaterally  Data Reviewed: Basic Metabolic Panel:  Recent Labs Lab 03/29/2015 1850 04/04/15 0239 04/05/15 0255 04/06/15 0405  NA  136 138 140 137  K 3.7 3.6 3.2* 3.5  CL 103 103 104 101  CO2 22 22 25 25   GLUCOSE 105* 112* 110* 123*  BUN 17 16 12 8   CREATININE 0.66 0.70 0.61 0.58*  CALCIUM 8.8* 8.6* 8.4* 8.4*   Liver Function Tests:  Recent Labs Lab 03/14/2015 1850 04/04/15 0239 04/06/15 0405  AST 58* 58* 37  ALT 123* 116* 69*  ALKPHOS 321* 325* 283*  BILITOT 1.2 1.5* 1.2  PROT 7.2 6.9 6.1*  ALBUMIN 3.1* 2.8* 2.4*    Recent Labs Lab 03/13/2015 1850 04/05/15 0255 04/06/15 0405  LIPASE 63* 74* 52*   CBC:  Recent Labs Lab 03/29/2015 1850 04/04/15 0239 04/05/15 0255 04/06/15 0405 04/07/15 0335  WBC 5.8 6.3 5.7 7.4 7.7  NEUTROABS 3.7  --   --   --   --   HGB 13.4 12.9* 12.0* 12.1*  11.5*  HCT 41.8 38.2* 37.0* 36.7* 35.9*  MCV 102.5* 101.3* 102.8* 100.3* 101.4*  PLT 505* 461* 398 399 434*     Recent Results (from the past 240 hour(s))  C difficile quick scan w PCR reflex     Status: None   Collection Time: 04/04/15 10:26 AM  Result Value Ref Range Status   C Diff antigen NEGATIVE NEGATIVE Final   C Diff toxin NEGATIVE NEGATIVE Final   C Diff interpretation Negative for toxigenic C. difficile  Final  Culture, body fluid-bottle     Status: None (Preliminary result)   Collection Time: 04/04/15 11:13 AM  Result Value Ref Range Status   Specimen Description FLUID ASCITIC  Final   Special Requests NONE  Final   Culture   Final    NO GROWTH 2 DAYS Performed at [facility]    Report Status PENDING  Incomplete  Gram stain     Status: None   Collection Time: 04/04/15 11:13 AM  Result Value Ref Range Status   Specimen Description FLUID ASCITIC  Final   Special Requests NONE  Final   Gram Stain   Final    FEW WBC PRESENT, PREDOMINANTLY MONONUCLEAR NO ORGANISMS SEEN Performed at [facility]    Report Status 04/04/2015 FINAL  Final  Culture, body fluid-bottle     Status: None (Preliminary result)   Collection Time: 04/05/15  3:28 PM  Result Value Ref Range Status   Specimen Description FLUID PLEURAL  Final   Special Requests BOTTLES DRAWN AEROBIC AND ANAEROBIC 10CC  Final   Culture   Final    NO GROWTH < 24 HOURS Performed at [facility]    Report Status PENDING  Incomplete  Gram stain     Status: None   Collection Time: 04/05/15  3:28 PM  Result Value Ref Range Status   Specimen Description FLUID PLEURAL  Final   Special Requests NONE  Final   Gram Stain   Final    CYTOSPIN SMEAR WBC PRESENT,BOTH PMN AND MONONUCLEAR NO ORGANISMS SEEN Performed at [facility]    Report Status 04/05/2015 FINAL  Final     Scheduled Meds: . antiseptic oral rinse  7 mL Mouth Rinse BID  . fluconazole (DIFLUCAN) IV  100 mg Intravenous Q24H   . magic mouthwash  10 mL Oral TID  . methylPREDNISolone (SOLU-MEDROL) injection  10 mg Intravenous Daily  . metoCLOPramide (REGLAN) injection  20 mg Intravenous 4 times per day  . pantoprazole (PROTONIX) IV  40 mg Intravenous Q12H  . piperacillin-tazobactam (ZOSYN)  IV  3.375 g  Intravenous 3 times per day  . sodium chloride  3 mL Intravenous Q12H  . vancomycin  1,250 mg Intravenous Q8H   Continuous Infusions:

## 2015-04-08 DIAGNOSIS — K838 Other specified diseases of biliary tract: Secondary | ICD-10-CM

## 2015-04-08 DIAGNOSIS — R112 Nausea with vomiting, unspecified: Secondary | ICD-10-CM

## 2015-04-08 DIAGNOSIS — Z515 Encounter for palliative care: Secondary | ICD-10-CM | POA: Diagnosis not present

## 2015-04-08 DIAGNOSIS — J910 Malignant pleural effusion: Secondary | ICD-10-CM | POA: Diagnosis present

## 2015-04-08 DIAGNOSIS — R140 Abdominal distension (gaseous): Secondary | ICD-10-CM

## 2015-04-08 DIAGNOSIS — K929 Disease of digestive system, unspecified: Secondary | ICD-10-CM

## 2015-04-08 DIAGNOSIS — R180 Malignant ascites: Secondary | ICD-10-CM

## 2015-04-08 LAB — GLUCOSE, CAPILLARY
GLUCOSE-CAPILLARY: 162 mg/dL — AB (ref 65–99)
GLUCOSE-CAPILLARY: 153 mg/dL — AB (ref 65–99)
Glucose-Capillary: 156 mg/dL — ABNORMAL HIGH (ref 65–99)
Glucose-Capillary: 158 mg/dL — ABNORMAL HIGH (ref 65–99)
Glucose-Capillary: 159 mg/dL — ABNORMAL HIGH (ref 65–99)

## 2015-04-08 LAB — CBC
HCT: 37.1 % — ABNORMAL LOW (ref 39.0–52.0)
Hemoglobin: 11.7 g/dL — ABNORMAL LOW (ref 13.0–17.0)
MCH: 32.2 pg (ref 26.0–34.0)
MCHC: 31.5 g/dL (ref 30.0–36.0)
MCV: 102.2 fL — AB (ref 78.0–100.0)
PLATELETS: 403 10*3/uL — AB (ref 150–400)
RBC: 3.63 MIL/uL — ABNORMAL LOW (ref 4.22–5.81)
RDW: 14.7 % (ref 11.5–15.5)
WBC: 8.5 10*3/uL (ref 4.0–10.5)

## 2015-04-08 LAB — COMPREHENSIVE METABOLIC PANEL
ALT: 61 U/L (ref 17–63)
ANION GAP: 10 (ref 5–15)
AST: 41 U/L (ref 15–41)
Albumin: 2.6 g/dL — ABNORMAL LOW (ref 3.5–5.0)
Alkaline Phosphatase: 285 U/L — ABNORMAL HIGH (ref 38–126)
BILIRUBIN TOTAL: 0.4 mg/dL (ref 0.3–1.2)
BUN: 16 mg/dL (ref 6–20)
CO2: 26 mmol/L (ref 22–32)
Calcium: 9 mg/dL (ref 8.9–10.3)
Chloride: 105 mmol/L (ref 101–111)
Creatinine, Ser: 1.32 mg/dL — ABNORMAL HIGH (ref 0.61–1.24)
GFR calc Af Amer: >60 mL/min (ref >60)
GFR, EST NON AFRICAN AMERICAN: 57 mL/min — AB (ref >60)
Glucose, Bld: 170 mg/dL — ABNORMAL HIGH (ref 65–99)
POTASSIUM: 3.4 mmol/L — AB (ref 3.5–5.1)
Sodium: 141 mmol/L (ref 135–145)
TOTAL PROTEIN: 6.4 g/dL — AB (ref 6.5–8.1)

## 2015-04-08 LAB — DIFFERENTIAL
BASOS PCT: 0 %
Basophils Absolute: 0 10*3/uL (ref 0.0–0.1)
EOS PCT: 1 %
Eosinophils Absolute: 0.1 10*3/uL (ref 0.0–0.7)
Lymphocytes Relative: 7 %
Lymphs Abs: 0.6 10*3/uL — ABNORMAL LOW (ref 0.7–4.0)
MONO ABS: 1.4 10*3/uL — AB (ref 0.1–1.0)
Monocytes Relative: 17 %
NEUTROS ABS: 6.4 10*3/uL (ref 1.7–7.7)
Neutrophils Relative %: 75 %

## 2015-04-08 LAB — PROCALCITONIN: Procalcitonin: 1.26 ng/mL

## 2015-04-08 LAB — MAGNESIUM: MAGNESIUM: 2.3 mg/dL (ref 1.7–2.4)

## 2015-04-08 LAB — PREALBUMIN: PREALBUMIN: 9.6 mg/dL — AB (ref 18–38)

## 2015-04-08 LAB — PHOSPHORUS: Phosphorus: 4.2 mg/dL (ref 2.5–4.6)

## 2015-04-08 LAB — TRIGLYCERIDES: Triglycerides: 210 mg/dL — ABNORMAL HIGH (ref <150)

## 2015-04-08 MED ORDER — MORPHINE SULFATE (PF) 2 MG/ML IV SOLN
2.0000 mg | Freq: Once | INTRAVENOUS | Status: AC
  Administered 2015-04-08: 2 mg via INTRAVENOUS

## 2015-04-08 MED ORDER — BISACODYL 10 MG RE SUPP
10.0000 mg | Freq: Three times a day (TID) | RECTAL | Status: DC
  Administered 2015-04-08 – 2015-04-11 (×9): 10 mg via RECTAL
  Filled 2015-04-08 (×10): qty 1

## 2015-04-08 MED ORDER — FENTANYL 25 MCG/HR TD PT72
25.0000 ug | MEDICATED_PATCH | TRANSDERMAL | Status: DC
  Administered 2015-04-08 – 2015-04-11 (×2): 25 ug via TRANSDERMAL
  Filled 2015-04-08 (×2): qty 1

## 2015-04-08 MED ORDER — POTASSIUM CHLORIDE 10 MEQ/100ML IV SOLN
10.0000 meq | INTRAVENOUS | Status: AC
  Administered 2015-04-08 (×3): 10 meq via INTRAVENOUS
  Filled 2015-04-08 (×4): qty 100

## 2015-04-08 MED ORDER — POTASSIUM CHLORIDE 10 MEQ/50ML IV SOLN
10.0000 meq | INTRAVENOUS | Status: AC
  Filled 2015-04-08 (×4): qty 50

## 2015-04-08 MED ORDER — TRACE MINERALS CR-CU-MN-SE-ZN 10-1000-500-60 MCG/ML IV SOLN
INTRAVENOUS | Status: AC
  Administered 2015-04-08: 18:00:00 via INTRAVENOUS
  Filled 2015-04-08: qty 1440

## 2015-04-08 MED ORDER — LORAZEPAM 2 MG/ML IJ SOLN
1.0000 mg | INTRAMUSCULAR | Status: DC | PRN
Start: 2015-04-08 — End: 2015-04-13
  Administered 2015-04-08 – 2015-04-10 (×10): 2 mg via INTRAVENOUS
  Administered 2015-04-10: 1 mg via INTRAVENOUS
  Administered 2015-04-11 – 2015-04-12 (×3): 2 mg via INTRAVENOUS
  Filled 2015-04-08 (×13): qty 1

## 2015-04-08 MED ORDER — FAT EMULSION 20 % IV EMUL
240.0000 mL | INTRAVENOUS | Status: AC
  Administered 2015-04-08: 240 mL via INTRAVENOUS
  Filled 2015-04-08: qty 250

## 2015-04-08 MED ORDER — PHENOL 1.4 % MT LIQD
1.0000 | OROMUCOSAL | Status: DC | PRN
  Filled 2015-04-08: qty 177

## 2015-04-08 NOTE — Progress Notes (Signed)
Nutrition Follow-up  DOCUMENTATION CODES:   Obesity unspecified  INTERVENTION:  - Continue TPN per pharmacy - Monitor magnesium, potassium, and phosphorus daily for at least 3 days, MD to replete as needed, as pt is at risk for refeeding syndrome given prolonged period of inadequate PO PTA. - Will continue to monitor for GOC - RD will continue to monitor for needs  NUTRITION DIAGNOSIS:   Increased nutrient needs related to catabolic illness, cancer and cancer related treatments as evidenced by estimated needs. -ongoing  GOAL:   Patient will meet greater than or equal to 90% of their needs -unmet at this time  MONITOR:   Weight trends, Labs, Skin, I & O's, Other (Comment) (TPN regimen)  ASSESSMENT:   59 yo male h/o stage 4 colon cancer s/p resection and chemo ended in September comes in with 3 weeks of progressive worsening abdominal pain and "bloating". Pt says he has been in the ED several times, was given something for constipation. However the pain and swelling just continued to worsen. He denies fevers. Has been nauseas and sometimes vomits. He says the pain has always been generalized, was never localized in the epigastrum. Nothing has relieved the pain despite pain meds he takes at home. No chills. The last several days his abdomen has gotten really big and he has been having some associated sob, no cough. No urinary symptoms. Pt transferred [facility] for [facility] CT scan, and surgical evaluation with likely superimposed pna.  11/4 TPN initiated yesterday @ 1800. Pt currently receiving Clinimix E 5/20 @ 40 mL/hr with 20% lipids @ 10 mL/hr. This is providing 48 grams protein (44% minimum estimated protein needs) and 1325 kcal (58% minimum estimated kcal needs).  Per pharmacy note this AM (this information was also discussed between this RD and pharmacist yesterday, 11/3, following rounds):  Clinimix 5/20 at a goal rate of 83 ml/hr + 20% fat emulsion at 10 ml/hr to  provide: 100 g/day protein, 2233 Kcal/day.  - Meets 91% of miniClinimix 5/20 at a goal rate of 83 ml/hr + 20% fat emulsion at 10 ml/hr to provide: 100 g/day protein, 2233 Kcal/day.mum protein needs and 97% of minimum Kcal needs. This rate will maximize the use of a 2L bag and minimize waste. If further protein supplementation is needed, may increase rate to 90-100 ml/hr.  PLAN  Now: KCl 20mEq IV x4 runs At 1800 today:  Advance to Clinimix E 5/20 at 60 ml/hr.  20% fat emulsion at 10 ml/hr.  Plan to advance as tolerated to the goal rate.  TPN to contain standard multivitamins and trace elements.  Continue CBGs and sensitive SSI q6h.  TPN lab panels on Mondays & Thursdays.  F/u daily - BMET ordered for Sat/Sun.   Will monitor for GOC/POC. No surgery planned at this time. Pt states he is having abdominal discomfort; no NGT in place at time of RD visit this AM.  Not currently meeting needs; will meet >90% estimated kcal and protein needs when TPN reaches goal rate. Unable to confirm malnutrition based on multi-point exam but highly likely to be present to some extent. Medications reviewed. Labs reviewed; CBGs: 120-159 mg/dL, K: 3.4 mmol/L, creatinine elevated, GFR: 57.    11/3 - Per pharmacy in rounds this AM, TPN to start tonight. - Monitor magnesium, potassium, and phosphorus daily for at least 3 days, MD to replete as needed, as pt is at risk for refeeding syndrome given prolonged period of inadequate PO intakes.  11/2 - Pt seen  after discussion with pharmacist yesterday revealing possible plan for TPN. - Pt was out of room to thoracentesis yesterday at time of RD visit.  - Spoke with pt and wife, who is at bedside.  - For the 2 weeks PTA pt was only able to take a few bites or sips of items at a time before feeling full, bloated.  - He had a good appetite and  felt hungry but once he started eating he was unable to take more than these few bites.  - He does sometimes have acid reflux flares and this had caused him, a few times in the past 2 weeks, to have a little bit of regurgitation.  - Wife states that this was a very small amount and that pt never had a formal episode of emesis. - Pt states that he had a few sips of lemonade earlier which caused him to belch. He states this actually felt good and relieved some abdominal pressure. He has been having some abdominal pain today.  - Pt had thoracentesis with 320 mL removed yesterday (11/1) and paracentesis 10/30 which removed 1.7 L.  - Per notes, surgery considering TPN for pt; will continue to monitor for this and the rest of plan of care.  *No muscle or fat wasting was noted at this time. Per wife and pt, his weight has remained fairly stable; confirmed by chart review which shows 3 lb weight gain in the past 2 months. Fluid weight may be masking lean body weight loss. Unable to confirm malnutrition at this time.    Diet Order:  Diet NPO time specified Except for: Ice Chips TPN (CLINIMIX-E) Adult .TPN (CLINIMIX-E) Adult  Skin:  Reviewed, no issues  Last BM:  10/31  Height:   Ht Readings from Last 1 Encounters:  04/04/15 5\' 5"  (1.651 m)    Weight:   Wt Readings from Last 1 Encounters:  04/07/15 202 lb 9.6 oz (91.9 kg)    Ideal Body Weight:  61.82 kg (kg)  BMI:  Body mass index is 33.71 kg/(m^2).  Estimated Nutritional Needs:   Kcal:  2300-2525  Protein:  110-125 grams  Fluid:  1.7-2 L/day  EDUCATION NEEDS:   No education needs identified at this time     Jarome Matin, RD, LDN Inpatient Clinical Dietitian Pager # [phone] After hours/weekend pager # [phone]

## 2015-04-08 NOTE — Consult Note (Addendum)
Consultation Note Date: 04/08/2015   Patient Name: Donald Fry  DOB: Nov 28, 1955  MRN: [id number]  Age / Sex: 59 y.o., male  PCP: [name], MD Referring Physician: Theodis Blaze, MD  Reason for Consultation: Establishing goals of care    Clinical Assessment/Narrative:  59 year old white male with known metastatic colon cancer, s/p resection back in March, on systemic chemotherapy presented for evaluation of abd pain several weeks in duration, has had recent upper endoscopy which was unremarkable. In [facility] ED, pt was noted to have ascites and has underwent paracentesis 1.7 L fluid was removed and due to concern of developing PNA pt was started on vancomycin and zosyn. CT chest was also notable for moderate left pleural effusion and IR consulted for thoracentesis.   He underwent thoracenteses and paracenteses. Pathology results state adenocarcinoma. And has been seen and evaluated by oncology, surgery.   Palliative care consult has been placed for pain management.  Patient is resting in bed. He has had his NG tube discontinued. Patient's wife is present at the bedside. Patient's wife states that they're simply overwhelmed with the amount of medical information they have received. She states she cannot believe how rapidly the patient has declined essentially since his initial diagnosis in February 2016 of this colon cancer. She states he underwent surgery and then underwent 12 rounds of chemotherapy. She recalls how well the patient responded to chemotherapy. She states he became sick with the last treatment on 02/22/2015.  Discussed with the patient. He states that he has generalized abdominal discomfort as well as pain in his sides. Additionally there is a significant anxiety component. Patient and the wife states that sometimes he asked for morphine as a for ensuring comfort from his anxiety. Discussed about Dilaudid  PCA. Both patient and wife reluctant to start Dilaudid for pain management as they believe anxiety is a primary component here that is driving his sensation of uncontrolled pain. Chart reviewed patient is on Ativan injection 1-2 milligrams.  Will follow-up on patient's use of Ativan and morphine over the course of 24 hours. Patient verbalizes very meaningfully that he is still thinking about the decision of whether to proceed with Avastin/FOLFOX therapy or simply resort to comfort/palliative based approach to his care. He states he has received extensive chemotherapy. He states that he is questioning himself and is seeking input from his loved ones whether undergoing additional chemotherapy for the benefit of some degree of life prolongation is worth it or not. Endorsed the patient's current level of thinking. Advised the patient and his wife that at this stage there are no right/wrong answers. The answer as to whether more treatments or hospice type care essentially needs to come from the patient himself.  Noted that the patient's last bowel movement was 10-30 1-16. Agree with IV Reglan. 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 Will follow up for results from Dulcolax rectal suppositories. Consider opioid induced component to his constipation/pseudoobstruction.  Palliative will continue to follow along for pain management as well as be available is an extra layer of support for additional goals of care discussions.  Thank you for the consult  Contacts/Participants in Discussion: Patient, his wife Katharine Look Primary Decision Maker: Patient then his wife Katharine Look Relationship to Patient wife HCPOA: no    SUMMARY OF RECOMMENDATIONS: Agree with discontinuation of NG tube as per patient request. No nausea currently.  Continue symptom management of pain, anxiety, generalized discomfort. Discussed with patient  about his use of IV  morphine.  he has used 18 mg of IV morphine in the past 24 hours. Recommended IV Dilaudid PCA. Patient and wife both endorse that anxiety is the primary complaint. They wish to hold off on using PCA for now. Patient has been started on transdermal fentanyl on 04-08-15. Will titrate upwards in 48 hours, based on IV Morphine use Continue IV Ativan when necessary Palliative will follow-up  Code Status/Advance Care Planning: DNR    Code Status Orders        Start     Ordered   04/08/15 0658  Do not attempt resuscitation (DNR)   Continuous    Question Answer Comment  In the event of cardiac or respiratory ARREST Do not call a "code blue"   In the event of cardiac or respiratory ARREST Do not perform Intubation, CPR, defibrillation or ACLS   In the event of cardiac or respiratory ARREST Use medication by any route, position, wound care, and other measures to relive pain and suffering. May use oxygen, suction and manual treatment of airway obstruction as needed for comfort.      04/08/15 8938      Other Directives:Other  Symptom Management:  As above  Palliative Prophylaxis:   Bowel Regimen agree with suppositories. Patient with more than 18 mg of IV morphine use in the past 24 hours. Will consider for opioid induced component to his constipation status/malignant pseudoobstruction. Will add Relistor in a.m. on 04/09/2015 if no results from suppositories today. Continue to monitor.  Additional Recommendations (Limitations, Scope, Preferences):  Continuing supportive care, active listening for now. Patient's wife states they feel overwhelmed with the extent of medical information they have received in the course of this hospitalization thus far. Offered supportive care.  Psycho-social/Spiritual:  Support System: [place] Desire for further Chaplaincy support:no Additional Recommendations: Caregiving  Support/Resources  Prognosis: < 6 months  Discharge Planning: Home under the care of his  wife possibly to be determined by clinical course   Chief Complaint/ Primary Diagnoses: Present on Admission:  . Essential hypertension . Cancer of transverse colon with hemorrhage s/p colectomy 07/30/2014 . Bile leak, postoperative . Abdominal pain . Ascites . Hypokalemia . Pneumonia involving left lung . Thrombocytosis ([place]) . Pleural effusion . Pancreatitis, acute . Oral thrush . Colon cancer metastasized to multiple sites [facility]) . Malignant ascites  I have reviewed the medical record, interviewed the patient and family, and examined the patient. The following aspects are pertinent.  Past Medical History  Diagnosis Date  . Hypertension   . High cholesterol   . Anxiety   . Transfusion history     [facility] last of March 29-2016  . Colon cancer ([place]) 07/30/2014  . Colon cancer metastasized to multiple sites [facility]) 08/20/2014   Social History   Social History  . Marital Status: Married    Spouse Name: N/A  . Number of Children: N/A  . Years of Education: N/A   Social History Main Topics  . Smoking status: Former Smoker -- 1.00 packs/day for 30 years    Types: Cigarettes    Quit date: 07/28/2014  . Smokeless tobacco: Never Used     Comment: quit 02- 2016  . Alcohol Use: No  . Drug Use: No  . Sexual Activity: No   Other Topics Concern  . None   Social History Narrative   Family History  Problem Relation Age of Onset  . Colon cancer Neg Hx    Scheduled Meds: . antiseptic oral  rinse  7 mL Mouth Rinse BID  . bisacodyl  10 mg Rectal TID  . insulin aspart  0-9 Units Subcutaneous 4 times per day  . magic mouthwash  10 mL Oral TID  . metoCLOPramide (REGLAN) injection  20 mg Intravenous 4 times per day  . pantoprazole (PROTONIX) IV  40 mg Intravenous Q12H  . potassium chloride  10 mEq Intravenous Q1 Hr x 4  . sodium chloride  3 mL Intravenous Q12H   Continuous Infusions: . Marland KitchenTPN (CLINIMIX-E) Adult     And  . fat emulsion    . Marland KitchenTPN (CLINIMIX-E) Adult 40 mL/hr at  04/07/15 1759   And  . fat emulsion 240 mL (04/07/15 1759)   PRN Meds:.diphenhydrAMINE, LORazepam, morphine injection, ondansetron **OR** ondansetron (ZOFRAN) IV, phenol, sodium chloride Medications Prior to Admission:  Prior to Admission medications   Medication Sig Start Date End Date Taking? Authorizing Provider  ALPRAZolam Duanne Moron) 1 MG tablet Take 1 tablet (1 mg total) by mouth 4 (four) times daily as needed for anxiety. 03/21/15  Yes Volanda Napoleon, MD  aspirin 81 MG tablet Take 81 mg by mouth daily.   Yes Historical Provider, MD  atenolol (TENORMIN) 100 MG tablet Take 100 mg by mouth daily.   Yes Historical Provider, MD  lidocaine-prilocaine (EMLA) cream Apply to affected area once 08/24/14  Yes Volanda Napoleon, MD  lovastatin (MEVACOR) 40 MG tablet Take 40 mg by mouth at bedtime.   Yes Historical Provider, MD  mirtazapine (REMERON) 15 MG tablet Take 0.5 tablets (7.5 mg total) by mouth 2 (two) times daily. 12/21/14  Yes Volanda Napoleon, MD  Multiple Vitamin (MULTIVITAMIN) tablet Take 1 tablet by mouth daily.   Yes Historical Provider, MD  ondansetron (ZOFRAN) 8 MG tablet Take 1 tablet (8 mg total) by mouth 2 (two) times daily. Start the day after chemo for 3 days, then as needed for n&v. 12/29/14  Yes Volanda Napoleon, MD  oxyCODONE-acetaminophen (ROXICET) 5-325 MG tablet Take 1-2 tablets by mouth every 6 (six) hours as needed for severe pain (for pain). 03/31/15  Yes [name], MD  pantoprazole (PROTONIX) 40 MG tablet Take 1 tablet (40 mg total) by mouth 2 (two) times daily. 03/22/15  Yes [name], NP  polyethylene glycol [employer] / GLYCOLAX) packet Take 1 packet with 8 ounces of water daily to help prevent constipation while taking narcotic pain medication. 03/31/15  Yes [name], MD  predniSONE (DELTASONE) 10 MG tablet TAKE ONE TABLET BY MOUTH ONE TIME DAILY WITH BREAKFAST 12/08/14  Yes [name], NP  prochlorperazine (COMPAZINE) 10 MG tablet Take 1 tablet (10 mg  total) by mouth every 6 (six) hours as needed (Nausea or vomiting). 08/24/14  Yes Volanda Napoleon, MD  senna (SENOKOT) 8.6 MG TABS tablet Take 1 tablet (8.6 mg total) by mouth daily as needed for mild constipation. 08/16/14  Yes Venetia Maxon Rama, MD  Skin Protectants, Misc. (EUCERIN) cream Apply 1 application topically daily. Applied to hands and feet   Yes Historical Provider, MD  sucralfate (CARAFATE) 1 GM/10ML suspension Take 10 mLs (1 g total) by mouth 4 (four) times daily -  with meals and at bedtime. 03/28/15  Yes [name], NP  temazepam (RESTORIL) 15 MG capsule TAKE 1 CAPSULE BY MOUTH NIGHTLY AT BEDTIME AS NEEDED FOR SLEEP 02/15/15  Yes Volanda Napoleon, MD   No Known Allergies CBC:    Component Value Date/Time   WBC 8.5 04/08/2015 0600  WBC 12.0* 03/28/2015 0951   HGB 11.7* 04/08/2015 0600   HGB 12.9* 03/28/2015 0951   HCT 37.1* 04/08/2015 0600   HCT 40.2 03/28/2015 0951   PLT 403* 04/08/2015 0600   PLT 335 03/28/2015 0951   MCV 102.2* 04/08/2015 0600   MCV 105* 03/28/2015 0951   NEUTROABS 6.4 04/08/2015 0600   NEUTROABS 10.1* 03/28/2015 0951   LYMPHSABS 0.6* 04/08/2015 0600   LYMPHSABS 0.6* 03/28/2015 0951   MONOABS 1.4* 04/08/2015 0600   EOSABS 0.1 04/08/2015 0600   EOSABS 0.3 03/28/2015 0951   BASOSABS 0.0 04/08/2015 0600   BASOSABS 0.1 03/28/2015 0951   Comprehensive Metabolic Panel:    Component Value Date/Time   NA 141 04/08/2015 0600   NA 141 03/28/2015 0954   NA 142 02/22/2015 0805   K 3.4* 04/08/2015 0600   K 4.0 03/28/2015 0954   K 3.9 02/22/2015 0805   CL 105 04/08/2015 0600   CL 104 02/22/2015 0805   CO2 26 04/08/2015 0600   CO2 27 03/28/2015 0954   CO2 29 02/22/2015 0805   BUN 16 04/08/2015 0600   BUN 13.6 03/28/2015 0954   BUN 15 02/22/2015 0805   CREATININE 1.32* 04/08/2015 0600   CREATININE 0.8 03/28/2015 0954   CREATININE 0.9 02/22/2015 0805   GLUCOSE 170* 04/08/2015 0600   GLUCOSE 141* 03/28/2015 0954   GLUCOSE 122* 02/22/2015 0805     CALCIUM 9.0 04/08/2015 0600   CALCIUM 9.7 03/28/2015 0954   CALCIUM 8.9 02/22/2015 0805   AST 41 04/08/2015 0600   AST 45* 03/28/2015 0954   AST 37 02/22/2015 0805   ALT 61 04/08/2015 0600   ALT 70* 03/28/2015 0954   ALT 68* 02/22/2015 0805   ALKPHOS 285* 04/08/2015 0600   ALKPHOS 168* 03/28/2015 0954   ALKPHOS 73 02/22/2015 0805   BILITOT 0.4 04/08/2015 0600   BILITOT 0.55 03/28/2015 0954   BILITOT 0.60 02/22/2015 0805   PROT 6.4* 04/08/2015 0600   PROT 7.2 03/28/2015 0954   PROT 6.5 02/22/2015 0805   ALBUMIN 2.6* 04/08/2015 0600   ALBUMIN 3.0* 03/28/2015 0954   ALBUMIN 3.3 02/22/2015 0805    Review of Systems Positive for fatigue, positive for abdominal pain Physical Exam Weak older than stated age appearing gentleman resting in bed Diminished breath sounds Abdomen firm and distended generalized absent bowel sounds S1-S2 No edema Awake alert, oriented, flat affect Vital Signs: BP 146/89 mmHg  Pulse 102  Temp(Src) 98.4 F (36.9 C) (Oral)  Resp 16  Ht 5\' 5"  (1.651 m)  Wt 91.9 kg (202 lb 9.6 oz)  BMI 33.71 kg/m2  SpO2 95% SpO2: Last BM Date: 04/04/15  O2 Device:SpO2: 95 % O2 Flow Rate: .O2 Flow Rate (L/min): 3 L/min Intake/output summary:  Intake/Output Summary (Last 24 hours) at 04/08/15 1324 Last data filed at 04/08/15 0700  Gross per 24 hour  Intake 912.84 ml  Output    850 ml  Net  62.84 ml   LBM:  BMP Latest Ref Rng 04/08/2015 04/07/2015 04/06/2015  Glucose 65 - 99 mg/dL 170(H) 113(H) 123(H)  BUN 6 - 20 mg/dL 16 10 8   Creatinine 0.61 - 1.24 mg/dL 1.32(H) 0.78 0.58(L)  Sodium 135 - 145 mmol/L 141 138 137  Potassium 3.5 - 5.1 mmol/L 3.4(L) 3.1(L) 3.5  Chloride 101 - 111 mmol/L 105 102 101  CO2 22 - 32 mmol/L 26 24 25   Calcium 8.9 - 10.3 mg/dL 9.0 8.5(L) 8.4(L)   Palliative performance scale 30% Baseline Weight: Weight: 88.905 kg (196  lb) Most recent weight: Weight: 91.9 kg (202 lb 9.6 oz)      Palliative Assessment/Data:  Flowsheet Rows         Most Recent Value   Intake Tab    Referral Department  Hospitalist   Unit at Time of Referral  Cardiac/Telemetry Unit   Palliative Care Primary Diagnosis  Cancer   Date Notified  04/08/15   Palliative Care Type  New Palliative care   Reason for referral  Pain   Date of Admission  03/23/2015   # of days IP prior to Palliative referral  5   Clinical Assessment    Psychosocial & Spiritual Assessment    Palliative Care Outcomes       Additional Data Reviewed: Recent Labs     04/07/15  0335  04/07/15  0735  04/08/15  0600  WBC  7.7   --   8.5  HGB  11.5*   --   11.7*  PLT  434*   --   403*  NA   --   138  141  BUN   --   10  16  CREATININE   --   0.78  1.32*    Time In: 1200 Time Out: 1300 Time Total: 60 Greater than 50%  of this time was spent counseling and coordinating care related to the above assessment and plan.  Signed by: Loistine Chance, MD [phone] Loistine Chance, MD  04/08/2015, 1:24 PM  Please contact Palliative Medicine Team phone at [phone] for questions and concerns.

## 2015-04-08 NOTE — Progress Notes (Signed)
Donald Fry is clearly having difficulties now. The fluid from the ascites and thoracentesis are positive for malignancy.  He had abdominal x-ray yesterday. This showed no distention. There is no free air.  I really don't think he needs to have an upper GI with small bowel follow-through. I don't think he needs to have a paracentesis at this point in time.  He had a NG tube placed yesterday. This does not surprise me.  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 This is incredibly difficult to overcome.  I spent about 45 minutes or so with the, his wife and brother. I explained to them what was going on.  I think the only option that we have here is to try chemotherapy. It has probably been 7 weeks since he has had chemotherapy. He had responded so well that we had tried to give him a "vacation".  We first saw him back in March, he was in very tough shape. However, he improved dramatically and quickly with treatment. As such, I think you be worthwhile to try treatment again. This time, I will add Avastin to FOLFOXIRI.  I talked him about this. He is not sure he would like to take treatment.  He clearly needs to have a PICC line. He has been started on TNA. I this is very reasonable. He has an IV in his left hand which is not going to be adequate.  He now is off IV antibiotics.  He is still on IV Reglan. I will try some suppositories on him.  His labs today show a white cell count of 8.5. Hemoglobin 11.7. Platelet count 403K. His albumin is 2.6. His creatinine is 1.32.  I spoke to him about CODE STATUS. I talked him about life support issues. He does not want to live on machines. He does not want aggressive measures to be taken for life support. His family is with him and a respect his wishes. As such, he is a DO NOT  RESUSCITATE.  On his physical exam, his vital signs are all stable. His blood pressure is 159/99. Pulse is 103.  I don't think needs to be on a cardiac monitor. It would be nice to try to get down to 3 W.  On his exam, his abdomen is still somewhat distended. His bowel sounds are absent. Cardiac exam regular rate and rhythm. Lungs are clear. Extremities shows no clubbing, cyanosis or edema.  I really feel bad for Donald Fry. He has done so well. He really had a good quality of life while taking treatment. Again, I think it would be worthwhile to try treatment again. Again I would add Avastin.  We'll see how he does over the weekend.  We had a very good prayer session. His brother is an Education administrator and he appreciated the prayers.  I am grateful for the excellent care that he is getting from everybody up on [address]  Rodman Key 21:22

## 2015-04-08 NOTE — Progress Notes (Signed)
PARENTERAL NUTRITION CONSULT NOTE - INITIAL  Pharmacy Consult for TPN Indication: Prolonged Ileus  No Known Allergies  Patient Measurements: Height: 5' 5"  (165.1 cm) Weight: 202 lb 9.6 oz (91.9 kg) IBW/kg (Calculated) : 61.5  Vital Signs: Temp: 98.6 F (37 C) (11/03 2143) Temp Source: Oral (11/03 2143) BP: 159/99 mmHg (11/03 2143) Pulse Rate: 103 (11/03 2143) Intake/Output from previous day: 11/03 0701 - 11/04 0700 In: 1066.8 [IV Piggyback:466; TPN:600.8] Out: 1150 [Urine:300; Emesis/NG output:850] Intake/Output from this shift:    Labs:  Recent Labs  04/06/15 0405 04/07/15 0335 04/08/15 0600  WBC 7.4 7.7 8.5  HGB 12.1* 11.5* 11.7*  HCT 36.7* 35.9* 37.1*  PLT 399 434* 403*     Recent Labs  04/06/15 0405 04/07/15 0735 04/08/15 0600  NA 137 138 141  K 3.5 3.1* 3.4*  CL 101 102 105  CO2 25 24 26   GLUCOSE 123* 113* 170*  BUN 8 10 16   CREATININE 0.58* 0.78 1.32*  CALCIUM 8.4* 8.5* 9.0  MG  --  2.1 2.3  PHOS  --  3.1 4.2  PROT 6.1* 6.3* 6.4*  ALBUMIN 2.4* 2.6* 2.6*  AST 37 50* 41  ALT 69* 69* 61  ALKPHOS 283* 299* 285*  BILITOT 1.2 1.0 0.4   Estimated Creatinine Clearance: 61.3 mL/min (by C-G formula based on Cr of 1.32).    Recent Labs  04/07/15 1821 04/08/15 0036 04/08/15 0609  GLUCAP 120* 158* 159*    Medical History: Past Medical History  Diagnosis Date  . Hypertension   . High cholesterol   . Anxiety   . Transfusion history     [facility] last of March 29-2016  . Colon cancer ([place]) 07/30/2014  . Colon cancer metastasized to multiple sites ([place]) 08/20/2014    Medications:  Infusions:  . Marland KitchenTPN (CLINIMIX-E) Adult 40 mL/hr at 04/07/15 1759   And  . fat emulsion 240 mL (04/07/15 1759)    Insulin Requirements in the past 24 hours: 4 units Novolog (sensitive SSI)  Current Nutrition: NPO  IVF: none  Central access: Implanted Port (08/16/14) TPN start date: 11/3  ASSESSMENT                                                                                                           HPI: 61 yoM presented to [facility] ED on 10/30 and was admitted to [facility] on 10/31 with 3 weeks of progressive worsening abdominal pain and "bloating". PMH significant for stage 4 colon cancer with extensive peritoneal disease s/p resection and chemo (last in September) with a plan to transition to oral maintenance chemotherapy, and remote hx of bile leak following cholecystectomy. No indication for surgery at this time. RD notes poor oral intake for ~3 weeks and has been unable to tolerate PO since admission.  Pharmacy is consulted to start TPN.  He will be high risk for refeeding with very limited PO intake over the last several weeks.  Significant events:   Today:   Glucose - slightly elevated after starting TPN, remains < 160, monitor SSI usage  Electrolytes - K low but improved; Na, Mag, Phos, and CorrCa 10.12 WNL.  Watch Phos increase.  Renal - SCr significantly increased (0.78 > 1.32)  LFTs - Alk phos, Lipase, AST, ALT all elevated. Tbili WNL.  TGs - pending  Prealbumin - pending  NUTRITIONAL GOALS                                                                                             RD recs (11/2): 110-125 g protein/day, 2300-2525 Kcal/day  Clinimix 5/20 at a goal rate of 83 ml/hr + 20% fat emulsion at 10 ml/hr to provide: 100 g/day protein, 2233 Kcal/day. - Meets 91% of minimum protein needs and 97% of minimum Kcal needs.  This rate will maximize the use of a 2L bag and minimize waste.  If further protein supplementation is needed, may increase rate to 90-100 ml/hr.  PLAN                                                                                                                         Now:  KCl 1mq IV x4 runs  At 1800 today:  Advance to Clinimix E 5/20 at 60 ml/hr.  20% fat emulsion at 10 ml/hr.  Plan to advance as tolerated to the goal rate.  TPN to contain standard multivitamins and trace elements.  Continue CBGs and  sensitive SSI q6h.  TPN lab panels on Mondays & Thursdays.  F/u daily - BMET ordered for Sat/Sun.  CGretta ArabPharmD, BCPS Pager [phone]/09/2014 7:22 AM

## 2015-04-08 NOTE — Progress Notes (Signed)
Patient ID: Donald Fry, male   DOB: May 04, 1956, 59 y.o.   MRN: [id number]     [facility]      [address] [place], [place] [address]    Phone: [phone] FAX: [phone]     Subjective: 828m bilious output.  No further n/v, although he does not care for the NGT.  No flatus or BM.  Objective:  Vital signs:  Filed Vitals:   04/06/15 2023 04/07/15 0523 04/07/15 1327 04/07/15 2143  BP: 150/95 150/95 134/84 159/99  Pulse: 109 96 116 103  Temp: 98.4 F (36.9 C) 98.2 F (36.8 C)  98.6 F (37 C)  TempSrc: Oral Oral Oral Oral  Resp: 20 18 18 18   Height:      Weight:  91.9 kg (202 lb 9.6 oz)    SpO2: 98% 98% 94% 91%    Last BM Date: 04/04/15  Intake/Output   Yesterday:  11/03 0701 - 11/04 0700 In: 1066.8 [IV Piggyback:466; TPN:600.8] Out: 1150 [Urine:300; Emesis/NG output:850] This shift:    I/O last 3 completed shifts: In: 1534.8 [I.V.:10; IV Piggyback:924] Out: 1250 [Urine:400; Emesis/NG output:850]   Physical Exam: General: Pt awake/alert/oriented x4 in mild acute distress Abdomen: +BS, abdomen is distended, diffuse tenderness. NGT in place, flushed to check placement.       Problem List:   Principal Problem:   Malignant ascites Active Problems:   Essential hypertension   Hypokalemia   Cancer of transverse colon with hemorrhage s/p colectomy 07/30/2014   Bile leak, postoperative   Colon cancer metastasized to multiple sites ([facility]   Ascites   Abdominal pain   Pneumonia involving left lung   Thrombocytosis (HCC)   Pleural effusion   Pancreatitis, acute   Oral thrush   S/P thoracentesis   Abdominal distension    Results:   Labs: Results for orders placed or performed during the hospital encounter of 03/15/2015 (from the past 48 hour(s))  CBC     Status: Abnormal   Collection Time: 04/07/15  3:35 AM  Result Value Ref Range   WBC 7.7 4.0 - 10.5 K/uL   RBC 3.54 (L) 4.22 - 5.81 MIL/uL   Hemoglobin 11.5 (L) 13.0 - 17.0 g/dL   HCT 35.9 (L) 39.0 - 52.0 %   MCV 101.4 (H) 78.0 - 100.0 fL   MCH 32.5 26.0 - 34.0 pg   MCHC 32.0 30.0 - 36.0 g/dL   RDW 14.5 11.5 - 15.5 %   Platelets 434 (H) 150 - 400 K/uL  Comprehensive metabolic panel     Status: Abnormal   Collection Time: 04/07/15  7:35 AM  Result Value Ref Range   Sodium 138 135 - 145 mmol/L   Potassium 3.1 (L) 3.5 - 5.1 mmol/L   Chloride 102 101 - 111 mmol/L   CO2 24 22 - 32 mmol/L   Glucose, Bld 113 (H) 65 - 99 mg/dL   BUN 10 6 - 20 mg/dL   Creatinine, Ser 0.78 0.61 - 1.24 mg/dL   Calcium 8.5 (L) 8.9 - 10.3 mg/dL   Total Protein 6.3 (L) 6.5 - 8.1 g/dL   Albumin 2.6 (L) 3.5 - 5.0 g/dL   AST 50 (H) 15 - 41 U/L   ALT 69 (H) 17 - 63 U/L   Alkaline Phosphatase 299 (H) 38 - 126 U/L   Total Bilirubin 1.0 0.3 - 1.2 mg/dL   GFR calc non Af Amer >60 >60 mL/min   GFR calc Af Amer >60 >  60 mL/min    Comment: (NOTE) The eGFR has been calculated using the CKD EPI equation. This calculation has not been validated in all clinical situations. eGFR's persistently <60 mL/min signify possible Chronic Kidney Disease.    Anion gap 12 5 - 15  Lipase, blood     Status: Abnormal   Collection Time: 04/07/15  7:35 AM  Result Value Ref Range   Lipase 72 (H) 11 - 51 U/L    Comment: Please note change in reference range.  Magnesium     Status: None   Collection Time: 04/07/15  7:35 AM  Result Value Ref Range   Magnesium 2.1 1.7 - 2.4 mg/dL  Phosphorus     Status: None   Collection Time: 04/07/15  7:35 AM  Result Value Ref Range   Phosphorus 3.1 2.5 - 4.6 mg/dL  Glucose, capillary     Status: Abnormal   Collection Time: 04/07/15  6:21 PM  Result Value Ref Range   Glucose-Capillary 120 (H) 65 - 99 mg/dL  Glucose, capillary     Status: Abnormal   Collection Time: 04/08/15 12:36 AM  Result Value Ref Range   Glucose-Capillary 158 (H) 65 - 99 mg/dL  Procalcitonin     Status: None   Collection Time: 04/08/15  6:00 AM  Result Value Ref  Range   Procalcitonin 1.26 ng/mL    Comment:        Interpretation: PCT > 0.5 ng/mL and <= 2 ng/mL: Systemic infection (sepsis) is possible, but other conditions are known to elevate PCT as well. (NOTE)         ICU PCT Algorithm               Non ICU PCT Algorithm    ----------------------------     ------------------------------         PCT < 0.25 ng/mL                 PCT < 0.1 ng/mL     Stopping of antibiotics            Stopping of antibiotics       strongly encouraged.               strongly encouraged.    ----------------------------     ------------------------------       PCT level decrease by               PCT < 0.25 ng/mL       >= 80% from peak PCT       OR PCT 0.25 - 0.5 ng/mL          Stopping of antibiotics                                             encouraged.     Stopping of antibiotics           encouraged.    ----------------------------     ------------------------------       PCT level decrease by              PCT >= 0.25 ng/mL       < 80% from peak PCT        AND PCT >= 0.5 ng/mL             Continuing antibiotics  encouraged.       Continuing antibiotics            encouraged.    ----------------------------     ------------------------------     PCT level increase compared          PCT > 0.5 ng/mL         with peak PCT AND          PCT >= 0.5 ng/mL             Escalation of antibiotics                                          strongly encouraged.      Escalation of antibiotics        strongly encouraged.   Comprehensive metabolic panel     Status: Abnormal   Collection Time: 04/08/15  6:00 AM  Result Value Ref Range   Sodium 141 135 - 145 mmol/L   Potassium 3.4 (L) 3.5 - 5.1 mmol/L   Chloride 105 101 - 111 mmol/L   CO2 26 22 - 32 mmol/L   Glucose, Bld 170 (H) 65 - 99 mg/dL   BUN 16 6 - 20 mg/dL   Creatinine, Ser 1.32 (H) 0.61 - 1.24 mg/dL   Calcium 9.0 8.9 - 10.3 mg/dL   Total Protein 6.4 (L) 6.5 - 8.1  g/dL   Albumin 2.6 (L) 3.5 - 5.0 g/dL   AST 41 15 - 41 U/L   ALT 61 17 - 63 U/L   Alkaline Phosphatase 285 (H) 38 - 126 U/L   Total Bilirubin 0.4 0.3 - 1.2 mg/dL   GFR calc non Af Amer 57 (L) >60 mL/min   GFR calc Af Amer >60 >60 mL/min    Comment: (NOTE) The eGFR has been calculated using the CKD EPI equation. This calculation has not been validated in all clinical situations. eGFR's persistently <60 mL/min signify possible Chronic Kidney Disease.    Anion gap 10 5 - 15  Prealbumin     Status: Abnormal   Collection Time: 04/08/15  6:00 AM  Result Value Ref Range   Prealbumin 9.6 (L) 18 - 38 mg/dL    Comment: Performed at [facility]  Magnesium     Status: None   Collection Time: 04/08/15  6:00 AM  Result Value Ref Range   Magnesium 2.3 1.7 - 2.4 mg/dL  Phosphorus     Status: None   Collection Time: 04/08/15  6:00 AM  Result Value Ref Range   Phosphorus 4.2 2.5 - 4.6 mg/dL  Triglycerides     Status: Abnormal   Collection Time: 04/08/15  6:00 AM  Result Value Ref Range   Triglycerides 210 (H) <150 mg/dL    Comment: Performed at [facility]  CBC     Status: Abnormal   Collection Time: 04/08/15  6:00 AM  Result Value Ref Range   WBC 8.5 4.0 - 10.5 K/uL   RBC 3.63 (L) 4.22 - 5.81 MIL/uL   Hemoglobin 11.7 (L) 13.0 - 17.0 g/dL   HCT 37.1 (L) 39.0 - 52.0 %   MCV 102.2 (H) 78.0 - 100.0 fL   MCH 32.2 26.0 - 34.0 pg   MCHC 31.5 30.0 - 36.0 g/dL   RDW 14.7 11.5 - 15.5 %   Platelets 403 (H) 150 - 400 K/uL  Differential  Status: Abnormal   Collection Time: 04/08/15  6:00 AM  Result Value Ref Range   Neutrophils Relative % 75 %   Neutro Abs 6.4 1.7 - 7.7 K/uL   Lymphocytes Relative 7 %   Lymphs Abs 0.6 (L) 0.7 - 4.0 K/uL   Monocytes Relative 17 %   Monocytes Absolute 1.4 (H) 0.1 - 1.0 K/uL   Eosinophils Relative 1 %   Eosinophils Absolute 0.1 0.0 - 0.7 K/uL   Basophils Relative 0 %   Basophils Absolute 0.0 0.0 - 0.1 K/uL  Glucose, capillary     Status:  Abnormal   Collection Time: 04/08/15  6:09 AM  Result Value Ref Range   Glucose-Capillary 159 (H) 65 - 99 mg/dL    Imaging / Studies: Dg Abd 2 Views  04/07/2015  CLINICAL DATA:  Constipation.  Abdominal pain. EXAM: ABDOMEN - 2 VIEW COMPARISON:  MRI 04/05/2015. Ultrasound 04/04/2015. CT 03/13/2015, 03/22/2015. Abdominal series 10 12/22/2014 . FINDINGS: Surgical clips right upper quadrant. Soft tissue structures unremarkable . No bowel distention noted. Decubitus views reveal no evidence of free air a along the upper lateral margin of the abdomen. Lucencies projected over the right lung base/ right upper abdomen are most likely within the lung and related to adjacent atelectasis. Continued follow-up abdominal series suggested. If an upright view can be obtained this would be useful. No large stool burden. Degenerative changes lumbar spine. Small left pleural effusion . IMPRESSION: 1. No evidence of bowel distention. No definitive evidence of a free intraperitoneal air. Small lucencies noted projected over the right lung base/upper most portion of the right abdomen are most likely in the lung and related to adjacent atelectasis. Continued follow-up abdominal series suggested. 2.  Small left pleural effusion. Electronically Signed   By: Marcello Moores  Register   On: 04/07/2015 09:07    Medications / Allergies:  Scheduled Meds: . antiseptic oral rinse  7 mL Mouth Rinse BID  . bisacodyl  10 mg Rectal TID  . insulin aspart  0-9 Units Subcutaneous 4 times per day  . magic mouthwash  10 mL Oral TID  . metoCLOPramide (REGLAN) injection  20 mg Intravenous 4 times per day  . pantoprazole (PROTONIX) IV  40 mg Intravenous Q12H  . potassium chloride  10 mEq Intravenous Q1 Hr x 4  . sodium chloride  3 mL Intravenous Q12H   Continuous Infusions: . Marland KitchenTPN (CLINIMIX-E) Adult     And  . fat emulsion    . Marland KitchenTPN (CLINIMIX-E) Adult 40 mL/hr at 04/07/15 1759   And  . fat emulsion 240 mL (04/07/15 1759)   PRN  Meds:.diphenhydrAMINE, LORazepam, morphine injection, ondansetron **OR** ondansetron (ZOFRAN) IV, sodium chloride  Antibiotics: Anti-infectives    Start     Dose/Rate Route Frequency Ordered Stop   04/07/15 1400  neomycin (MYCIFRADIN) tablet 1,000 mg  Status:  Discontinued     1,000 mg Oral 3 times per day on Thu 04/06/15 1353 04/06/15 1507   04/07/15 1400  metroNIDAZOLE (FLAGYL) tablet 1,000 mg  Status:  Discontinued     1,000 mg Oral 3 times daily 04/06/15 1353 04/06/15 1507   04/06/15 1600  vancomycin (VANCOCIN) 1,250 mg in sodium chloride 0.9 % 250 mL IVPB  Status:  Discontinued     1,250 mg 166.7 mL/hr over 90 Minutes Intravenous Every 8 hours 04/06/15 0932 04/07/15 0657   04/05/15 2200  piperacillin-tazobactam (ZOSYN) IVPB 3.375 g  Status:  Discontinued     3.375 g 12.5 mL/hr over 240 Minutes Intravenous 3  times per day 04/05/15 1811 04/07/15 0657   04/05/15 1200  fluconazole (DIFLUCAN) IVPB 100 mg  Status:  Discontinued     100 mg 50 mL/hr over 60 Minutes Intravenous Every 24 hours 04/05/15 1026 04/07/15 0657   04/04/15 1400  vancomycin (VANCOCIN) IVPB 1000 mg/200 mL premix  Status:  Discontinued     1,000 mg 200 mL/hr over 60 Minutes Intravenous Every 8 hours 04/04/15 0308 04/06/15 0932   04/04/15 1400  piperacillin-tazobactam (ZOSYN) IVPB 3.375 g  Status:  Discontinued     3.375 g 12.5 mL/hr over 240 Minutes Intravenous 3 times per day 04/04/15 0308 04/05/15 1811   04/04/15 0115  vancomycin (VANCOCIN) IVPB 1000 mg/200 mL premix     1,000 mg 200 mL/hr over 60 Minutes Intravenous  Once 04/04/15 0100 04/04/15 0339   04/04/15 0115  piperacillin-tazobactam (ZOSYN) IVPB 3.375 g     3.375 g 100 mL/hr over 30 Minutes Intravenous  Once 04/04/15 0100 04/04/15 0309       Assessment/Plan: Abdominal pain and ascites Stage IV colon cancer -thoracentesis and paracentesis cytology reveals malignant cells.   -Dr. Marin Olp and the patient are discussing chemotherapy.  He has subsequently  been made a DNR.  -there is no role for abdominal surgery at this time. -would recommend continuing with NGT for now.  He is complaining of a sore throat, Ill add chloraseptic spray to see if it will help.  Question gastric ulcer. This was ruled out by endoscopy on 03/23/2015. Possibly could have developed an ulcer in the antrum, but seems less likely. No extravasation seen on double contrast CT 10/31.  Advanced colon cancer, status post cholecystectomy and right colectomy and biopsy of peritoneal nodules on 07/30/2014. He has abdominal carcinomatosis, although no bulky disease seen on CT. Appreciate Dr. Antonieta Pert involvement.  Pleural effusion and possible left lower lobe pneumonia. antibiotics have been stopped.   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 Stent subsequently removed.  Abnormal LFTs. Etiology unclear.   Erby Pian, [facility] Pager [phone](7A-4:30P)   04/08/2015 8:14 AM

## 2015-04-08 NOTE — Progress Notes (Signed)
Patient ID: Donald Fry, male   DOB: Dec 06, 1955, 59 y.o.   MRN: [id number]  [facility] PROGRESS NOTE  Donald Fry LPF:[id number] DOB: 01/24/1956 DOA: 03/26/2015 PCP: [name], MD   Brief narrative:    59 year old white male with known metastatic colon cancer, s/p resection back in March, on systemic chemotherapy presented for evaluation of abd pain several weeks in duration, has had recent upper endoscopy which was unremarkable. In [facility] ED, pt was noted to have ascites and has underwent paracentesis 1.7 L fluid was removed and due to concern of developing PNA pt was started on vancomycin and zosyn. CT chest was also notable for moderate left pleural effusion and IR consulted for thoracentesis.   Assessment/Plan:    Principal Problem:   Abdominal pain with ascites - secondary to metastatic colon cancer and ileus, abd distension  . MRI abd with multiple loculated fluid collections suspicious for infected ascites, developing abscesses  - per surgery team and IR currently no indication for an intervention, ABX discontinued 11/03 as no further convincing evidence of an infectious etiology - continue with PPI IV BID - s/p paracentesis 1.7 L fluid removed 10/31, fluid with malignant cells - pt was placed on TPN 11/03 and has had NGT placed as well but he is uncomfortable and has requested NGT to be pulled out 11/04 (will respect his wishes)  - PCT consulted as well for assistance     Multifocal PNA, RML and RLL, LLL  - continued vanc and zosyn for now day #5/5, no further signs of an infectious etiology, ABX stopped 11/03 - also continue with stress dose steroids  - pt is on Prednisone at home but currently unable to take anything PO     Question gastric ulcer - ruled out by endoscopy on 03/23/2015.  - No extravasation seen on double contrast CT yesterday    ? Acute pancreatitis  - elevated lipase with imaging studies notable for it - per oncology, not consistent with  pancreatitis     Hypokalemia - supplemented    Oral thrush - continue Diflucan day #4/7    Acute kidney injury - likely pre renal in etiology - difficult to give IVF as bad distended, pt currently on TPN - will ask for bladder scan to rule out obstruction  - BMP In AM    Thrombocytosis - reactive, will monitor     Advanced colon cancer - status post cholecystectomy and right colectomy and biopsy of peritoneal nodules on 07/30/2014. - ascitic fluid analysis and pleural fluid both with malignant cells - Appreciate Dr. Antonieta Pert involvement. - oncologist d/w family consideration of chemo while inpatient, pt to decide - PCT also consulted per family request     Left pleural effusion - thoracentesis done 11/01 , ~ 300 cc fluid removed 11/01, fluid analysis with malignant cells - respiratory status is stable this AM     Immunocompromised - due to chronic steroid use and moderate to severe protein calorie malnutrition - placed on stress dose solumedrol 10 mg PO QD until pt able to take PO     Remote history of bile leak following cholecystectomy - treated with stent which was subsequently removed.    Ileus - monitor for now - abx xray this am with no signs of obstruction  - NGT placed 11/03 but pt has asked to have it removed 11/04, will respect wishes     Abnormal LFTs - unclear etiology, slowly trending down     Protein calorie malnutrition -  severe, in the setting of acute on chronic and progressive cancer - on TPN  DVT prophylaxis - SCD's  Code Status: SNR Family Communication:  plan of care discussed with the patient and multiple family members at bedside Disposition Plan: to be determined   IV access:  Peripheral IV  Procedures and diagnostic studies:    Dg Eye Foreign Body 04/05/2015 No evidence of metallic foreign body within the orbits.   Dg Chest 1 View 04/05/2015  No pneumothorax following left thoracentesis. Small bilateral effusions with bibasilar  atelectasis.   Ct Chest W ContrastCt Angio Chest Pe W/cm &/or Wo Cm 03/08/2015   Similar areas of scattered airspace disease in the right middle and lower lobe. This could represent atelectasis or pneumonia or pneumonitis. Small but slightly increased right pleural effusion. 2. Moderate left pleural effusion representing a change from the prior study. New left lower lobe consolidation could represent pneumonia/pneumonitis or atelectasis 3. No evidence of pulmonary embolism 4. Again identified is a tiny focus of gas adjacent to the gastric antrum. As previously described, this may represent a small gastric ulcer. 5. Although there is no other evidence of pneumoperitoneum, there has been a significant increase in the volume of ascites in the abdomen and upper pelvis. The ascites appears somewhat complex as described above suggesting the possibility of peritonitis. 6. Multiple small nonenhancing liver lesions stable. 7. Anterior margin of the pancreas appears somewhat indistinct. This could reflect pancreatitis, but the finding is equivocal and may be related to the presence of fluid in the lesser sac. 8. Relative to decompressed colon, numerous fluid-filled loops of small bowel are mildly prominent but still within normal limits. This likely reflects mild ileus.   Mr Abdomen W Wo Contrast 04/05/2015  Mild motion degradation. 2. Given this factor, no evidence of hepatic metastasis. 3. Multiple loculated fluid collections, including within the perihepatic space and left abdomen. These are suspicious for infected ascites. Developing abscess or abscesses cannot be excluded. 4. Bilateral pleural effusions with adjacent airspace disease. 5. Subtle pancreatic duct dilatation with an abrupt cut off in the region the pancreatic head. Although no dominant mass is seen in this area, there is hypo enhancement. Given limitations of the current exam, and comorbidities, consider follow-up with pancreatic protocol CT at 6-8  weeks (ideally after the patient is recovered from the acute episode) to exclude a non border deforming adenocarcinoma. 6. Apparent tiny filling defect in the portal vein could be artifactual, given absence of thrombus on the 03/29/2015 CT. Recommend attention on follow-up. 7. Mild prominence of small bowel loops, favoring adynamic ileus. 8. Upper abdominal adenopathy which could be reactive or metastatic.   US Paracentesis 04/04/2015  Successful ultrasound-guided diagnostic and therapeutic paracentesis yielding 1.7 liters of peritoneal fluid.   US Thoracentesis Asp Pleural Space W/img Guide 04/05/2015  Successful ultrasound-guided diagnostic and therapeutic left sided thoracentesis yielding 320 cc of pleural fluid.   Medical Consultants:  IR Oncology Surgery  PCT  Other Consultants:  None  IAnti-Infectives:   Vanc and Zosyn 10/30 --> 11/03  Faye Ramsay, MD  [facility] Pager [phone]  If 7PM-7AM, please contact night-coverage [email or web address] Password [email or web address] 04/08/2015, 10:55 AM   LOS: 5 days   HPI/Subjective: No events overnight.   Objective: Filed Vitals:   04/06/15 2023 04/07/15 0523 04/07/15 1327 04/07/15 2143  BP: 150/95 150/95 134/84 159/99  Pulse: 109 96 116 103  Temp: 98.4 F (36.9 C) 98.2 F (36.8 C)  98.6 F (37 C)  TempSrc: Oral Oral Oral Oral  Resp: 20 18 18 18   Height:      Weight:  91.9 kg (202 lb 9.6 oz)    SpO2: 98% 98% 94% 91%    Intake/Output Summary (Last 24 hours) at 04/08/15 1055 Last data filed at 04/08/15 0700  Gross per 24 hour  Intake 1066.84 ml  Output    850 ml  Net 216.84 ml    Exam:   General:  Pt is alert, follows commands appropriately, not in acute distress  Cardiovascular: Regular rhythm, tachycardic, no rubs, no gallops  Respiratory: Clear to auscultation bilaterally, diminished breath sounds at bases   Abdomen: Soft, slightly tender in epigastric area, distended, no guarding  Extremities: pulses DP and PT palpable  bilaterally  Data Reviewed: Basic Metabolic Panel:  Recent Labs Lab 04/04/15 0239 04/05/15 0255 04/06/15 0405 04/07/15 0735 04/08/15 0600  NA 138 140 137 138 141  K 3.6 3.2* 3.5 3.1* 3.4*  CL 103 104 101 102 105  CO2 22 25 25 24 26   GLUCOSE 112* 110* 123* 113* 170*  BUN 16 12 8 10 16   CREATININE 0.70 0.61 0.58* 0.78 1.32*  CALCIUM 8.6* 8.4* 8.4* 8.5* 9.0  MG  --   --   --  2.1 2.3  PHOS  --   --   --  3.1 4.2   Liver Function Tests:  Recent Labs Lab 04/02/2015 1850 04/04/15 0239 04/06/15 0405 04/07/15 0735 04/08/15 0600  AST 58* 58* 37 50* 41  ALT 123* 116* 69* 69* 61  ALKPHOS 321* 325* 283* 299* 285*  BILITOT 1.2 1.5* 1.2 1.0 0.4  PROT 7.2 6.9 6.1* 6.3* 6.4*  ALBUMIN 3.1* 2.8* 2.4* 2.6* 2.6*    Recent Labs Lab 03/22/2015 1850 04/05/15 0255 04/06/15 0405 04/07/15 0735  LIPASE 63* 74* 52* 72*   CBC:  Recent Labs Lab 03/16/2015 1850 04/04/15 0239 04/05/15 0255 04/06/15 0405 04/07/15 0335 04/08/15 0600  WBC 5.8 6.3 5.7 7.4 7.7 8.5  NEUTROABS 3.7  --   --   --   --  6.4  HGB 13.4 12.9* 12.0* 12.1* 11.5* 11.7*  HCT 41.8 38.2* 37.0* 36.7* 35.9* 37.1*  MCV 102.5* 101.3* 102.8* 100.3* 101.4* 102.2*  PLT 505* 461* 398 399 434* 403*     Recent Results (from the past 240 hour(s))  C difficile quick scan w PCR reflex     Status: None   Collection Time: 04/04/15 10:26 AM  Result Value Ref Range Status   C Diff antigen NEGATIVE NEGATIVE Final   C Diff toxin NEGATIVE NEGATIVE Final   C Diff interpretation Negative for toxigenic C. difficile  Final  Culture, body fluid-bottle     Status: None (Preliminary result)   Collection Time: 04/04/15 11:13 AM  Result Value Ref Range Status   Specimen Description FLUID ASCITIC  Final   Special Requests NONE  Final   Culture   Final    NO GROWTH 3 DAYS Performed at [facility]    Report Status PENDING  Incomplete  Gram stain     Status: None   Collection Time: 04/04/15 11:13 AM  Result Value Ref Range  Status   Specimen Description FLUID ASCITIC  Final   Special Requests NONE  Final   Gram Stain   Final    FEW WBC PRESENT, PREDOMINANTLY MONONUCLEAR NO ORGANISMS SEEN Performed at [facility]    Report Status 04/04/2015 FINAL  Final  Culture, body fluid-bottle     Status: None (Preliminary result)   Collection Time:  04/05/15  3:28 PM  Result Value Ref Range Status   Specimen Description FLUID PLEURAL  Final   Special Requests BOTTLES DRAWN AEROBIC AND ANAEROBIC 10CC  Final   Culture   Final    NO GROWTH 2 DAYS Performed at [facility]    Report Status PENDING  Incomplete  Gram stain     Status: None   Collection Time: 04/05/15  3:28 PM  Result Value Ref Range Status   Specimen Description FLUID PLEURAL  Final   Special Requests NONE  Final   Gram Stain   Final    CYTOSPIN SMEAR WBC PRESENT,BOTH PMN AND MONONUCLEAR NO ORGANISMS SEEN Performed at [facility]    Report Status 04/05/2015 FINAL  Final     Scheduled Meds: . antiseptic oral rinse  7 mL Mouth Rinse BID  . bisacodyl  10 mg Rectal TID  . insulin aspart  0-9 Units Subcutaneous 4 times per day  . magic mouthwash  10 mL Oral TID  . metoCLOPramide (REGLAN) injection  20 mg Intravenous 4 times per day  . pantoprazole (PROTONIX) IV  40 mg Intravenous Q12H  . potassium chloride  10 mEq Intravenous Q1 Hr x 4  . sodium chloride  3 mL Intravenous Q12H   Continuous Infusions: . Marland KitchenTPN (CLINIMIX-E) Adult     And  . fat emulsion    . Marland KitchenTPN (CLINIMIX-E) Adult 40 mL/hr at 04/07/15 1759   And  . fat emulsion 240 mL (04/07/15 1759)

## 2015-04-09 ENCOUNTER — Inpatient Hospital Stay (HOSPITAL_COMMUNITY): Payer: Medicaid Other

## 2015-04-09 ENCOUNTER — Encounter (HOSPITAL_COMMUNITY): Payer: Self-pay | Admitting: Internal Medicine

## 2015-04-09 DIAGNOSIS — D638 Anemia in other chronic diseases classified elsewhere: Secondary | ICD-10-CM

## 2015-04-09 DIAGNOSIS — W19XXXD Unspecified fall, subsequent encounter: Secondary | ICD-10-CM

## 2015-04-09 DIAGNOSIS — F329 Major depressive disorder, single episode, unspecified: Secondary | ICD-10-CM

## 2015-04-09 DIAGNOSIS — F4322 Adjustment disorder with anxiety: Secondary | ICD-10-CM

## 2015-04-09 DIAGNOSIS — N179 Acute kidney failure, unspecified: Secondary | ICD-10-CM | POA: Diagnosis not present

## 2015-04-09 DIAGNOSIS — J910 Malignant pleural effusion: Secondary | ICD-10-CM

## 2015-04-09 DIAGNOSIS — E43 Unspecified severe protein-calorie malnutrition: Secondary | ICD-10-CM | POA: Diagnosis present

## 2015-04-09 DIAGNOSIS — C19 Malignant neoplasm of rectosigmoid junction: Secondary | ICD-10-CM | POA: Diagnosis present

## 2015-04-09 DIAGNOSIS — I10 Essential (primary) hypertension: Secondary | ICD-10-CM | POA: Diagnosis present

## 2015-04-09 DIAGNOSIS — W19XXXA Unspecified fall, initial encounter: Secondary | ICD-10-CM | POA: Diagnosis present

## 2015-04-09 LAB — CBC
HCT: 36.9 % — ABNORMAL LOW (ref 39.0–52.0)
HEMOGLOBIN: 11.6 g/dL — AB (ref 13.0–17.0)
MCH: 32 pg (ref 26.0–34.0)
MCHC: 31.4 g/dL (ref 30.0–36.0)
MCV: 101.9 fL — AB (ref 78.0–100.0)
Platelets: 424 10*3/uL — ABNORMAL HIGH (ref 150–400)
RBC: 3.62 MIL/uL — AB (ref 4.22–5.81)
RDW: 14.8 % (ref 11.5–15.5)
WBC: 7.9 10*3/uL (ref 4.0–10.5)

## 2015-04-09 LAB — BASIC METABOLIC PANEL
Anion gap: 10 (ref 5–15)
BUN: 24 mg/dL — AB (ref 6–20)
CHLORIDE: 106 mmol/L (ref 101–111)
CO2: 25 mmol/L (ref 22–32)
Calcium: 9.1 mg/dL (ref 8.9–10.3)
Creatinine, Ser: 1.52 mg/dL — ABNORMAL HIGH (ref 0.61–1.24)
GFR, EST AFRICAN AMERICAN: 55 mL/min — AB (ref >60)
GFR, EST NON AFRICAN AMERICAN: 48 mL/min — AB (ref >60)
GLUCOSE: 157 mg/dL — AB (ref 65–99)
POTASSIUM: 3.4 mmol/L — AB (ref 3.5–5.1)
SODIUM: 141 mmol/L (ref 135–145)

## 2015-04-09 LAB — MAGNESIUM: Magnesium: 2.6 mg/dL — ABNORMAL HIGH (ref 1.7–2.4)

## 2015-04-09 LAB — CULTURE, BODY FLUID W GRAM STAIN -BOTTLE: Culture: NO GROWTH

## 2015-04-09 LAB — PHOSPHORUS: Phosphorus: 4.7 mg/dL — ABNORMAL HIGH (ref 2.5–4.6)

## 2015-04-09 LAB — GLUCOSE, CAPILLARY
GLUCOSE-CAPILLARY: 126 mg/dL — AB (ref 65–99)
GLUCOSE-CAPILLARY: 147 mg/dL — AB (ref 65–99)
GLUCOSE-CAPILLARY: 148 mg/dL — AB (ref 65–99)
GLUCOSE-CAPILLARY: 167 mg/dL — AB (ref 65–99)

## 2015-04-09 LAB — CULTURE, BODY FLUID-BOTTLE

## 2015-04-09 MED ORDER — LORAZEPAM 2 MG/ML IJ SOLN
2.0000 mg | Freq: Four times a day (QID) | INTRAMUSCULAR | Status: DC
  Administered 2015-04-09 – 2015-04-11 (×9): 2 mg via INTRAVENOUS
  Filled 2015-04-09 (×9): qty 1

## 2015-04-09 MED ORDER — HEPARIN SOD (PORK) LOCK FLUSH 100 UNIT/ML IV SOLN
500.0000 [IU] | INTRAVENOUS | Status: DC
  Filled 2015-04-09: qty 5

## 2015-04-09 MED ORDER — DEXTROSE 10 % IV SOLN
INTRAVENOUS | Status: DC
  Administered 2015-04-09: 06:00:00 via INTRAVENOUS
  Filled 2015-04-09: qty 1000

## 2015-04-09 MED ORDER — POTASSIUM CHLORIDE 10 MEQ/100ML IV SOLN
10.0000 meq | INTRAVENOUS | Status: AC
  Administered 2015-04-09 (×4): 10 meq via INTRAVENOUS
  Filled 2015-04-09 (×3): qty 100

## 2015-04-09 MED ORDER — SODIUM CHLORIDE 0.9 % IJ SOLN
10.0000 mL | INTRAMUSCULAR | Status: DC | PRN
  Administered 2015-04-10: 10 mL
  Filled 2015-04-09: qty 40

## 2015-04-09 MED ORDER — ATENOLOL 100 MG PO TABS
100.0000 mg | ORAL_TABLET | Freq: Every day | ORAL | Status: DC
  Administered 2015-04-09 – 2015-04-11 (×3): 100 mg via ORAL
  Filled 2015-04-09 (×5): qty 1

## 2015-04-09 MED ORDER — M.V.I. ADULT IV INJ
INJECTION | INTRAVENOUS | Status: AC
  Administered 2015-04-09: 19:00:00 via INTRAVENOUS
  Filled 2015-04-09: qty 1440

## 2015-04-09 MED ORDER — HYDROMORPHONE HCL 1 MG/ML IJ SOLN
0.3000 mg | INTRAMUSCULAR | Status: DC | PRN
  Administered 2015-04-09 – 2015-04-10 (×10): 0.3 mg via INTRAVENOUS
  Filled 2015-04-09 (×10): qty 1

## 2015-04-09 MED ORDER — SODIUM CHLORIDE 0.9 % IJ SOLN
10.0000 mL | Freq: Two times a day (BID) | INTRAMUSCULAR | Status: DC
  Administered 2015-04-11: 10 mL

## 2015-04-09 MED ORDER — FAT EMULSION 20 % IV EMUL
240.0000 mL | INTRAVENOUS | Status: AC
  Administered 2015-04-09: 240 mL via INTRAVENOUS
  Filled 2015-04-09: qty 250

## 2015-04-09 MED ORDER — HEPARIN SOD (PORK) LOCK FLUSH 100 UNIT/ML IV SOLN
500.0000 [IU] | INTRAVENOUS | Status: DC | PRN
  Administered 2015-04-09: 500 [IU]
  Filled 2015-04-09 (×2): qty 5

## 2015-04-09 NOTE — Progress Notes (Signed)
Daily Progress Note   Patient Name: Donald Fry       Date: 04/09/2015 DOB: [birth year]  Age: 59 y.o. MRN#: [id number] Attending Physician: Robbie Lis, MD Primary Care Physician: [name], MD Admit Date: 03/22/2015  Reason for Consultation/Follow-up: pain and non pain symptom management and Establishing goals of care   Life limiting illness:  Colon cancer.   Subjective:  The patient is currently asleep. Brother and wife present at the bedside Interval Events/discussion with family: Overnight events noted. The patient did not rest well overnight. Anxiety and abdominal/back discomfort continues. The patient's brother is present at the bedside. He states that he spent the night at the hospital. Medication chart reviewed. The patient has received 26 mg of IV morphine in the past 24 hours. The patient is on transdermal fentanyl this will be up titrated by 04-10-15. Additionally, the patient received 12 mg of IV Ativan over the course of the past 24 hours.  The patient had a fall in the bathroom earlier this morning. He received suppositories on 04-08-15. The family thinks that he possibly had a very very small amount of liquid stool. Otherwise no significant/formed bowel movements. Last bowel movement 10-30 1-16.  Surgical note reviewed. It is noted that the patient possibly has extensive non-bulky disease. No surgical interventions.  Patient continues on nothing by mouth, TPN. Blood work reviewed, basic metabolic profile increasing serum creatinine now 1.52.  Plan: Discussed with patient's brother and wife. Will rotate to IV Dilaudid.  equianalgesic dose calculations done. Continue with IV Ativan. Will give 2 mg IV scheduled Ativan every 6 hours. Patient's family states he used to  take Xanax pretty regularly at home. He wished that the patient can be kept more calm so that he may be kept out of pain for longer intervals.  Attempted goals of care discussions. The patient's wife states that they have been told that there is no urgency/pressure to decide between chemotherapy versus no chemotherapy. She wishes to have further discussions with the patient later today as he is able. I agreed with her and also offered active listening and supportive care.   Patient's brother stated, "we know where this is going". When asked to elaborate he states that he believes that the patient does not have much longer in terms of life expectancy. He  wishes to keep the patient as calm and as out of pain as possible. Offered extensive counseling and support. Remains NPO and on TPN for now.   Length of Stay: 6 days  Current Medications: Scheduled Meds:  . antiseptic oral rinse  7 mL Mouth Rinse BID  . bisacodyl  10 mg Rectal TID  . fentaNYL  25 mcg Transdermal Q72H  . insulin aspart  0-9 Units Subcutaneous 4 times per day  . LORazepam  2 mg Intravenous Q6H  . magic mouthwash  10 mL Oral TID  . metoCLOPramide (REGLAN) injection  20 mg Intravenous 4 times per day  . pantoprazole (PROTONIX) IV  40 mg Intravenous Q12H  . potassium chloride  10 mEq Intravenous Q1 Hr x 4  . sodium chloride  3 mL Intravenous Q12H    Continuous Infusions: . Marland KitchenTPN (CLINIMIX-E) Adult Stopped (04/09/15 0445)   And  . fat emulsion Stopped (04/09/15 0445)  . dextrose 60 mL/hr at 04/09/15 0613  . Marland KitchenTPN (CLINIMIX-E) Adult     And  . fat emulsion      PRN Meds: diphenhydrAMINE, HYDROmorphone (DILAUDID) injection, LORazepam, ondansetron **OR** ondansetron (ZOFRAN) IV, phenol, sodium chloride  Physical Exam: Physical Exam             Weak non verbal this am Does not arouse to gentle physical exam S1-S2 Diminished at bases Abdomen firm distended Extremities trace edema Vital Signs: BP 121/90 mmHg  Pulse 105   Temp(Src) 98.2 F (36.8 C) (Oral)  Resp 20  Ht 5\' 5"  (1.651 m)  Wt 90.2 kg (198 lb 13.7 oz)  BMI 33.09 kg/m2  SpO2 97% SpO2: SpO2: 97 % O2 Device: O2 Device: Nasal Cannula O2 Flow Rate: O2 Flow Rate (L/min): 3 L/min  Intake/output summary:  Intake/Output Summary (Last 24 hours) at 04/09/15 1052 Last data filed at 04/09/15 0700  Gross per 24 hour  Intake 2372.5 ml  Output      1 ml  Net 2371.5 ml   LBM:   Baseline Weight: Weight: 88.905 kg (196 lb) Most recent weight: Weight: 90.2 kg (198 lb 13.7 oz)       Palliative Assessment/Data: Flowsheet Rows        Most Recent Value   Intake Tab    Referral Department  Hospitalist   Unit at Time of Referral  Cardiac/Telemetry Unit   Palliative Care Primary Diagnosis  Cancer   Date Notified  04/08/15   Palliative Care Type  New Palliative care   Reason for referral  Pain   Date of Admission  03/23/2015   # of days IP prior to Palliative referral  5   Clinical Assessment    Psychosocial & Spiritual Assessment    Palliative Care Outcomes       Additional Data Reviewed: Recent Labs     04/08/15  0600  04/09/15  0535  WBC  8.5  7.9  HGB  11.7*  11.6*  PLT  403*  424*  NA  141  141  BUN  16  24*  CREATININE  1.32*  1.52*     Problem List:  Patient Active Problem List   Diagnosis Date Noted  . 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 Encounter for palliative care   . Malignant ascites 04/07/2015  . Abdominal distension   . Pneumonia involving left lung 04/05/2015  . Thrombocytosis ([place]) 04/05/2015  . Pancreatitis, acute 04/05/2015  . Oral thrush 04/05/2015  . S/P thoracentesis   . Abdominal pain 04/04/2015  .  Ascites 03/13/2015  . Colitis 08/29/2014  . Dehydration 08/29/2014  . Colon cancer metastasized to multiple sites ([place]) 08/20/2014  . Depression 08/13/2014  . Poor appetite 08/13/2014  . Fever 08/08/2014  . Bronchospasm 08/02/2014  . Bile leak, postoperative 08/02/2014  . HCAP (healthcare-associated  pneumonia) 08/02/2014  . Acute respiratory failure with hypoxia ([place]) 08/01/2014  . Cancer of transverse colon with hemorrhage s/p colectomy 07/30/2014 07/30/2014  . Lower GI bleed   . Right sided abdominal pain   . GI bleed 07/28/2014  . Essential hypertension 07/28/2014  . Hyperlipidemia 07/28/2014  . Anxiety state 07/28/2014  . Hypokalemia 07/28/2014  . Acute blood loss anemia 07/28/2014  . Tobacco abuse 07/28/2014     Palliative Care Assessment & Plan    1.Code Status:  DNR    Code Status Orders        Start     Ordered   04/08/15 0658  Do not attempt resuscitation (DNR)   Continuous    Question Answer Comment  In the event of cardiac or respiratory ARREST Do not call a "code blue"   In the event of cardiac or respiratory ARREST Do not perform Intubation, CPR, defibrillation or ACLS   In the event of cardiac or respiratory ARREST Use medication by any route, position, wound care, and other measures to relive pain and suffering. May use oxygen, suction and manual treatment of airway obstruction as needed for comfort.      04/08/15 0658       2. Goals of Care:   As outlined above  Limitations on Scope of Treatment: Continue with aggressive symptom management, continue to support the family  Desire for further Chaplaincy support:no  Psycho-social Needs: Caregiving  Support/Resources  3. Symptom Management:      1. As above  4. Palliative Prophylaxis:   Bowel Regimen  5. Prognosis: ?probably few weeks  6. Discharge Planning:  pending further discussions and hospital course   Care plan was discussed with  Patient's wife and brother at the bedside  Thank you for allowing the Palliative Medicine Team to assist in the care of this patient.   Time In: 1000 Time Out: 1035 Total Time 35 Prolonged Time Billed  no        [phone]  Loistine Chance, MD  04/09/2015, 10:52 AM  Please contact Palliative Medicine Team phone at [phone] for questions and  concerns.

## 2015-04-09 NOTE — Progress Notes (Signed)
Around [id number] the patient in room 1413 called the front desk to let the nurse know that he had fallen. Immediately RN, Nurse Tech, Nurse Secretary, and Charge Nurse went into patient's room. Pt had gotten back in bed and peripheral IV and Port had come disconnected. Pt TNA and Lipids were contaminated so they had to be stopped immediately. IV team was paged and new peripheral and port was accessed again. The whole time the patient has been here he's been refusing the bed alarm and having family stay with him 24/7 to help him to and from the bathroom. Pt brother was in the room with him at the time of the fall this morning standing right outside the bathroom door. Pt got help to bathroom and then pt decided to try to get up from commode alone without calling brother back into help because he said he "didn't want to bother him." Pt said upon standing he felt light headed and fell forward and hit his head. NP on call was notified and a head CT was ordered, EKG was done, and post fall flow sheet and note were filled out. Pt vital signs have been within normal range for patient. Pt does get morphine 2mg  every 2 hours and ativan 2mg  every 4 hours. Last time pain medicine and ativan were administered was 0248. Pt said he had no pain and just was irritated and needed more ativan. Patient's range of motion was still in tact. Pt was informed that he was now a high fall risk which requires the bed alarm to be on and that pt will have to be assisted to the bathroom or up anywhere no matter what by a nurse or nurse tech along with family for safety precautions. Pt and family said he didn't want anyone to watch him go to the bathroom but RN explained why this had to happen now and she understood the frustration but we didn't want him to get hurt or fall again. Nurse coming onto days was filled in on his information and vital signs schedule. She will continue to monitor patient for any changes in status and notify MD if there  is.   Carmela Hurt, RN 8:22 AM 04/09/2015

## 2015-04-09 NOTE — Progress Notes (Addendum)
Peripherally Inserted Central Catheter/Midline Placement  The IV Nurse has discussed with the patient and/or persons authorized to consent for the patient, the purpose of this procedure and the potential benefits and risks involved with this procedure.  The benefits include less needle sticks, lab draws from the catheter and patient may be discharged home with the catheter.  Risks include, but not limited to, infection, bleeding, blood clot (thrombus formation), and puncture of an artery; nerve damage and irregular heat beat.  Alternatives to this procedure were also discussed.  PICC/Midline Placement Documentation  PICC / Midline Double Lumen 71/21/97 PICC Left Basilic 42 cm 0 cm (Active)  Indication for Insertion or Continuance of Line Limited venous access - need for IV therapy >5 days (PICC only) 04/09/2015  6:33 PM  Exposed Catheter (cm) 0 cm 04/09/2015  6:33 PM  Site Assessment Clean;Dry;Intact 04/09/2015  6:33 PM  Lumen #1 Status Flushed;Saline locked;Blood return noted 04/09/2015  6:33 PM  Lumen #2 Status Flushed;Saline locked;Blood return noted 04/09/2015  6:33 PM  Dressing Type Transparent 04/09/2015  6:33 PM  Dressing Status Clean;Dry;Intact 04/09/2015  6:33 PM  Dressing Change Due 04/16/15 04/09/2015  6:33 PM       [facility] Payment 04/09/2015, 6:35 PM Consent obtained from wife via telephone.  Pt pleasantly confused secondary to pain medication and Ativan.  Wife also updated after procedure completed.

## 2015-04-09 NOTE — Progress Notes (Signed)
   04/09/15 0445  What Happened  Was fall witnessed? No  Was patient injured? Unsure  Patient found in bathroom;other (Comment) (pt got up alone after using bathroom and fell forward)  Found by No one-pt stated they fell (pt got up on his own and called to say he fell)  Stated prior activity other (comment) (in the bathroom with family right outside the door)  Follow Up  MD notified yes  Time MD notified [phone]  Family notified Yes-comment (family in room with patient)  Time family notified [phone]  Additional tests Yes-comment (head CT without contrast, EKG)  Simple treatment Ice (pt was offerred ice but refused)  Progress note created (see row info) Yes  Adult Fall Risk Assessment  Risk Factor Category (scoring not indicated) Fall has occurred during this admission (document High fall risk)  Age 59  Fall History: Fall within 6 months prior to admission 0  Elimination; Bowel and/or Urine Incontinence 0  Elimination; Bowel and/or Urine Urgency/Frequency 0  Medications: includes PCA/Opiates, Anti-convulsants, Anti-hypertensives, Diuretics, Hypnotics, Laxatives, Sedatives, and Psychotropics 5  Patient Care Equipment 1  Mobility-Assistance 2  Mobility-Gait 2  Mobility-Sensory Deficit 0  Cognition-Awareness 0  Cognition-Impulsiveness 0  Cognition-Limitations 0  Total Score 11  Patient's Fall Risk High Fall Risk (>13 points)  Adult Fall Risk Interventions  Required Bundle Interventions *See Row Information* High fall risk - low, moderate, and high requirements implemented  Additional Interventions Fall risk signage;Individualized elimination schedule;Reorient/diversional activities with confused patients;Room near nurses station;Secure all tubes/drains;Use of appropriate toileting equipment (bedpan, BSC, etc.)  Fall with Injury Screening  Risk For Fall Injury- See Row Information  Nurse judgement  Intervention(s) for 2 or more risk criteria identified Gait Belt;Low Bed  Vitals  Temp  97.8 F (36.6 C)  Temp Source Oral  BP (!) 158/98 mmHg  BP Location Right Arm  BP Method Automatic  Patient Position (if appropriate) Lying  Pulse Rate (!) 116  Pulse Rate Source Monitor  Resp (!) 26  Oxygen Therapy  SpO2 93 %  O2 Device Nasal Cannula  O2 Flow Rate (L/min) 3 L/min  Pain Assessment  Pain Assessment 0-10  Pain Score 0 (pt states he has no pain at this time)  PCA/Epidural/Spinal Assessment  Respiratory Pattern Regular;Unlabored  Neurological  Neuro (WDL) WDL  Neuro Additional Assessments No  Musculoskeletal  Musculoskeletal (WDL) X  Generalized Weakness Yes  Weight Bearing Restrictions No  Integumentary  Integumentary (WDL) X  Skin Color Appropriate for ethnicity  Skin Condition Dry  Skin Integrity Other (Comment) (pt fell and has a scrape on knee )   Carmela Hurt, RN

## 2015-04-09 NOTE — Progress Notes (Addendum)
Patient ID: Donald Fry, male   DOB: 10-07-1955, 59 y.o.   MRN: [id number] [facility] PROGRESS NOTE  Donald Fry QQP:[id number] DOB: 04-08-1956 DOA: 04/02/2015 PCP: [name], MD  Brief narrative:    59 year old white male with known metastatic colon cancer, s/p resection in March, on systemic chemotherapy presented for evaluation of abd pain of several weeks in duration, has had recent upper endoscopy which was unremarkable. In [facility] ED, pt was noted to have ascites and has underwent paracentesis with 1.7 L fluid removed. Due to concern for developing PNA pt was started on vancomycin and zosyn. CT chest was also notable for moderate left pleural effusion for which he underwent thoracentesis.  Barrier to discharge: Plan for chemo early next week.   Assessment/Plan:    Principal Problem:  Abdominal pain with malignant ascites / abdominal distention / abnormal LFT's - Likely due to metastatic colon cancer, ileus - MRI abd demonstrated multiple loculated fluid collections suspicious for infected ascites, developing abscesses  - Pt seen by surgery and IR with recommendations to discontinue abx as no convincing evidence of infection - S/p paracentesis - 1.7 L fluid removed 10/31, fluid with malignant cells - Continue TPN for nutritional support - Palliative care consulted for goals of care   Active Problems:   Fall [place] last night - CT head without acute findings   Multifocal PNA, RML and RLL, LLL  - Vanco and zosyn stopped 11/03 - Steroids stopped per oncology    Questionable gastric ulcer - It was ruled out by endoscopy on 03/23/2015.  - No extravasation seen on double contrast CT yesterday    Oral thrush - Was on diflucan, stopped by oncology    Acute pancreatitis  - Has had elevated lipase but per oncology, does not really look likely pancreatitis    Hypokalemia - Due to GI losses - Supplemented    Acute kidney injury - Likely due to GI losses,  dehydration - Obtain bladder scan - Cr up this am, 1.3 --> 1.5   Metastatic colon cancer with peritoneal carcinomatosis  - Status post cholecystectomy and right colectomy and biopsy of peritoneal nodules on 07/30/2014. - Ascitic fluid analysis and pleural fluid both with malignant cells - Dr. Antonieta Pert plans chemo while inpatient - Was on steroids, stopped by oncology    Left pleural effusion - S/P thoracentesis 11/01 ~ 300 cc fluid removed 11/01, fluid analysis demonstrated malignant cells    Anemia of chronic disease - Due to history of malignancy - Hemoglobin stable    Ileus - NGT placed 11/03 but pt has asked to have it removed 11/04 - Continue to monitor    Protein calorie malnutrition, severe  - In the setting of acute on chronic and progressive cancer - on TPN  DVT Prophylaxis  - SCD's bilaterally    Code Status: DNR/DNI  Family Communication:  plan of care discussed with the patient and his wife and his brother at the bedside  Disposition Plan: likely in next couple of days   IV access:  Peripheral IV  Procedures and diagnostic studies:    Dg Eye Foreign Body 04/05/2015 No evidence of metallic foreign body within the orbits.   Dg Chest 1 View 04/05/2015 No pneumothorax following left thoracentesis. Small bilateral effusions with bibasilar atelectasis.   Ct Chest W ContrastCt Angio Chest Pe W/cm &/or Wo Cm 03/13/2015 Similar areas of scattered airspace disease in the right middle and lower lobe. This could represent atelectasis or pneumonia or pneumonitis.  Small but slightly increased right pleural effusion. 2. Moderate left pleural effusion representing a change from the prior study. New left lower lobe consolidation could represent pneumonia/pneumonitis or atelectasis 3. No evidence of pulmonary embolism 4. Again identified is a tiny focus of gas adjacent to the gastric antrum. As previously described, this may represent a small gastric ulcer. 5. Although there  is no other evidence of pneumoperitoneum, there has been a significant increase in the volume of ascites in the abdomen and upper pelvis. The ascites appears somewhat complex as described above suggesting the possibility of peritonitis. 6. Multiple small nonenhancing liver lesions stable. 7. Anterior margin of the pancreas appears somewhat indistinct. This could reflect pancreatitis, but the finding is equivocal and may be related to the presence of fluid in the lesser sac. 8. Relative to decompressed colon, numerous fluid-filled loops of small bowel are mildly prominent but still within normal limits. This likely reflects mild ileus.   Mr Abdomen W Wo Contrast 04/05/2015 Mild motion degradation. 2. Given this factor, no evidence of hepatic metastasis. 3. Multiple loculated fluid collections, including within the perihepatic space and left abdomen. These are suspicious for infected ascites. Developing abscess or abscesses cannot be excluded. 4. Bilateral pleural effusions with adjacent airspace disease. 5. Subtle pancreatic duct dilatation with an abrupt cut off in the region the pancreatic head. Although no dominant mass is seen in this area, there is hypo enhancement. Given limitations of the current exam, and comorbidities, consider follow-up with pancreatic protocol CT at 6-8 weeks (ideally after the patient is recovered from the acute episode) to exclude a non border deforming adenocarcinoma. 6. Apparent tiny filling defect in the portal vein could be artifactual, given absence of thrombus on the 03/29/2015 CT. Recommend attention on follow-up. 7. Mild prominence of small bowel loops, favoring adynamic ileus. 8. Upper abdominal adenopathy which could be reactive or metastatic.   US Paracentesis 04/04/2015 Successful ultrasound-guided diagnostic and therapeutic paracentesis yielding 1.7 liters of peritoneal fluid.   US Thoracentesis Asp Pleural Space W/img Guide 04/05/2015 Successful ultrasound-guided  diagnostic and therapeutic left sided thoracentesis yielding 320 cc of pleural fluid.    Medical Consultants:  IR Oncology Surgery  PCT  Other Consultants:  PT Nutrition  IAnti-Infectives:   Vanco and zosyn   Leisa Lenz, MD  [facility] Pager [phone]  Time spent in minutes: 25 minutes  If 7PM-7AM, please contact night-coverage [email or web address] Password [email or web address] 04/09/2015, 11:44 AM   LOS: 6 days    HPI/Subjective: No acute overnight events. Patient reports he is tired, has had no sleep last night. He fell last night.   Objective: Filed Vitals:   04/09/15 0712 04/09/15 0817 04/09/15 0915 04/09/15 1009  BP: 145/92 131/98 142/102 121/90  Pulse: 106 107 105 105  Temp: 98.5 F (36.9 C) 98.5 F (36.9 C) 98.4 F (36.9 C) 98.2 F (36.8 C)  TempSrc: Oral Oral Oral Oral  Resp: 20 20 20 20   Height:      Weight:      SpO2: 96% 96% 97% 97%    Intake/Output Summary (Last 24 hours) at 04/09/15 1144 Last data filed at 04/09/15 0700  Gross per 24 hour  Intake 2372.5 ml  Output      1 ml  Net 2371.5 ml    Exam:   General:  Pt is alert, follows commands appropriately, not in acute distress  Cardiovascular: Regular rate and rhythm, S1/S2 (+)  Respiratory: Clear to auscultation bilaterally, no wheezing, no crackles, no rhonchi  Abdomen: distended,  bowel sounds sluggish   Extremities: No edema, pulses DP and PT palpable bilaterally  Neuro: Grossly nonfocal  Data Reviewed: Basic Metabolic Panel:  Recent Labs Lab 04/05/15 0255 04/06/15 0405 04/07/15 0735 04/08/15 0600 04/09/15 0535  NA 140 137 138 141 141  K 3.2* 3.5 3.1* 3.4* 3.4*  CL 104 101 102 105 106  CO2 25 25 24 26 25   GLUCOSE 110* 123* 113* 170* 157*  BUN 12 8 10 16  24*  CREATININE 0.61 0.58* 0.78 1.32* 1.52*  CALCIUM 8.4* 8.4* 8.5* 9.0 9.1  MG  --   --  2.1 2.3 2.6*  PHOS  --   --  3.1 4.2 4.7*   Liver Function Tests:  Recent Labs Lab 03/24/2015 1850 04/04/15 0239 04/06/15 0405  04/07/15 0735 04/08/15 0600  AST 58* 58* 37 50* 41  ALT 123* 116* 69* 69* 61  ALKPHOS 321* 325* 283* 299* 285*  BILITOT 1.2 1.5* 1.2 1.0 0.4  PROT 7.2 6.9 6.1* 6.3* 6.4*  ALBUMIN 3.1* 2.8* 2.4* 2.6* 2.6*    Recent Labs Lab 03/30/2015 1850 04/05/15 0255 04/06/15 0405 04/07/15 0735  LIPASE 63* 74* 52* 72*   No results for input(s): AMMONIA in the last 168 hours. CBC:  Recent Labs Lab 03/07/2015 1850  04/05/15 0255 04/06/15 0405 04/07/15 0335 04/08/15 0600 04/09/15 0535  WBC 5.8  < > 5.7 7.4 7.7 8.5 7.9  NEUTROABS 3.7  --   --   --   --  6.4  --   HGB 13.4  < > 12.0* 12.1* 11.5* 11.7* 11.6*  HCT 41.8  < > 37.0* 36.7* 35.9* 37.1* 36.9*  MCV 102.5*  < > 102.8* 100.3* 101.4* 102.2* 101.9*  PLT 505*  < > 398 399 434* 403* 424*  < > = values in this interval not displayed. Cardiac Enzymes: No results for input(s): CKTOTAL, CKMB, CKMBINDEX, TROPONINI in the last 168 hours. BNP: Invalid input(s): POCBNP CBG:  Recent Labs Lab 04/08/15 0609 04/08/15 1203 04/08/15 1719 04/08/15 2346 04/09/15 0536  GLUCAP 159* 162* 153* 156* 126*    Recent Results (from the past 240 hour(s))  C difficile quick scan w PCR reflex     Status: None   Collection Time: 04/04/15 10:26 AM  Result Value Ref Range Status   C Diff antigen NEGATIVE NEGATIVE Final   C Diff toxin NEGATIVE NEGATIVE Final   C Diff interpretation Negative for toxigenic C. difficile  Final  Culture, body fluid-bottle     Status: None   Collection Time: 04/04/15 11:13 AM  Result Value Ref Range Status   Specimen Description FLUID ASCITIC  Final   Special Requests NONE  Final   Culture   Final    NO GROWTH 5 DAYS Performed at [facility]    Report Status 04/09/2015 FINAL  Final  Gram stain     Status: None   Collection Time: 04/04/15 11:13 AM  Result Value Ref Range Status   Specimen Description FLUID ASCITIC  Final   Special Requests NONE  Final   Gram Stain   Final    FEW WBC PRESENT, PREDOMINANTLY  MONONUCLEAR NO ORGANISMS SEEN Performed at [facility]    Report Status 04/04/2015 FINAL  Final  Culture, body fluid-bottle     Status: None (Preliminary result)   Collection Time: 04/05/15  3:28 PM  Result Value Ref Range Status   Specimen Description FLUID PLEURAL  Final   Special Requests BOTTLES DRAWN AEROBIC AND ANAEROBIC 10CC  Final  Culture   Final    NO GROWTH 4 DAYS Performed at [facility]    Report Status PENDING  Incomplete  Gram stain     Status: None   Collection Time: 04/05/15  3:28 PM  Result Value Ref Range Status   Specimen Description FLUID PLEURAL  Final   Special Requests NONE  Final   Gram Stain   Final    CYTOSPIN SMEAR WBC PRESENT,BOTH PMN AND MONONUCLEAR NO ORGANISMS SEEN Performed at [facility]    Report Status 04/05/2015 FINAL  Final     Scheduled Meds: . antiseptic oral rinse  7 mL Mouth Rinse BID  . bisacodyl  10 mg Rectal TID  . fentaNYL  25 mcg Transdermal Q72H  . insulin aspart  0-9 Units Subcutaneous 4 times per day  . LORazepam  2 mg Intravenous Q6H  . magic mouthwash  10 mL Oral TID  . metoCLOPramide (REGLAN) injection  20 mg Intravenous 4 times per day  . pantoprazole (PROTONIX) IV  40 mg Intravenous Q12H  . potassium chloride  10 mEq Intravenous Q1 Hr x 4   Continuous Infusions: . Marland KitchenTPN (CLINIMIX-E) Adult Stopped (04/09/15 0445)   And  . fat emulsion Stopped (04/09/15 0445)  . dextrose 60 mL/hr at 04/09/15 0613  . Marland KitchenTPN (CLINIMIX-E) Adult     And  . fat emulsion

## 2015-04-09 NOTE — Progress Notes (Signed)
PARENTERAL NUTRITION CONSULT NOTE - INITIAL  Pharmacy Consult for TPN Indication: Prolonged Ileus  No Known Allergies  Patient Measurements: Height: 5' 5"  (165.1 cm) Weight: 198 lb 13.7 oz (90.2 kg) IBW/kg (Calculated) : 61.5  Vital Signs: Temp: 98.5 F (36.9 C) (11/05 0817) Temp Source: Oral (11/05 0817) BP: 131/98 mmHg (11/05 0817) Pulse Rate: 107 (11/05 0817) Intake/Output from previous day: 11/04 0701 - 11/05 0700 In: 2372.5 [I.V.:47; IV Piggyback:316; TPN:2009.5] Out: 1 [Urine:1] Intake/Output from this shift:    Labs:  Recent Labs  04/07/15 0335 04/08/15 0600 04/09/15 0535  WBC 7.7 8.5 7.9  HGB 11.5* 11.7* 11.6*  HCT 35.9* 37.1* 36.9*  PLT 434* 403* 424*     Recent Labs  04/07/15 0735 04/08/15 0600 04/09/15 0535  NA 138 141 141  K 3.1* 3.4* 3.4*  CL 102 105 106  CO2 24 26 25   GLUCOSE 113* 170* 157*  BUN 10 16 24*  CREATININE 0.78 1.32* 1.52*  CALCIUM 8.5* 9.0 9.1  MG 2.1 2.3 2.6*  PHOS 3.1 4.2 4.7*  PROT 6.3* 6.4*  --   ALBUMIN 2.6* 2.6*  --   AST 50* 41  --   ALT 69* 61  --   ALKPHOS 299* 285*  --   BILITOT 1.0 0.4  --   PREALBUMIN  --  9.6*  --   TRIG  --  210*  --    Estimated Creatinine Clearance: 52.7 mL/min (by C-G formula based on Cr of 1.52).    Recent Labs  04/08/15 1719 04/08/15 2346 04/09/15 0536  GLUCAP 153* 156* 126*    Medical History: Past Medical History  Diagnosis Date  . Hypertension   . High cholesterol   . Anxiety   . Transfusion history     [facility] last of March 29-2016  . Colon cancer ([place]) 07/30/2014  . Colon cancer metastasized to multiple sites ([place]) 08/20/2014    Medications:  Infusions:  . Marland KitchenTPN (CLINIMIX-E) Adult Stopped (04/09/15 0445)   And  . fat emulsion Stopped (04/09/15 0445)  . dextrose 60 mL/hr at 04/09/15 6440    Insulin Requirements in the past 24 hours: 7 units Novolog (sensitive SSI)  Current Nutrition: NPO  IVF: none  Central access: Implanted Port (08/16/14) TPN start date:  11/3  ASSESSMENT                                                                                                          HPI: 39 yoM [facility] ED on 10/30 and was admitted to [facility] on 10/31 with 3 weeks of progressive worsening abdominal pain and "bloating". PMH significant for stage 4 colon cancer with extensive peritoneal disease s/p resection and chemo (last in September) with a plan to transition to oral maintenance chemotherapy, and remote hx of bile leak following cholecystectomy. No indication for surgery at this time. RD notes poor oral intake for ~3 weeks and has been unable to tolerate PO since admission.  Pharmacy is consulted to start TPN.  He will be high risk for refeeding with very  limited PO intake over the last several weeks.  Significant events:  11/5: 0500-patient fell, TPN was disconnected, running d10w till new bag arrives tonight  Today:   Glucose - slightly elevated after starting TPN, remains < 160, monitor SSI usage  Electrolytes - Mag and Phos slightly elevated, K low ; Na, and CorrCa 10.12 WNL.   Renal - SCr continues to increase (0.78 > 1.52)  LFTs - Alk phos, Lipase, AST, ALT all elevated. Tbili WNL.  TGs - 210  Prealbumin - 9.6  NUTRITIONAL GOALS                                                                                             RD recs (11/2): 110-125 g protein/day, 2300-2525 Kcal/day  Clinimix 5/20 at a goal rate of 83 ml/hr + 20% fat emulsion at 10 ml/hr to provide: 100 g/day protein, 2233 Kcal/day. - Meets 91% of minimum protein needs and 97% of minimum Kcal needs.  This rate will maximize the use of a 2L bag and minimize waste.  If further protein supplementation is needed, may increase rate to 90-100 ml/hr.  PLAN                                                                                                                         Now:  KCl 41mq IV x4 runs  At 1800 today:  Resume Clinimix E 5/20 at 60 ml/hr.  20% fat emulsion at  10 ml/hr.  Plan to advance as tolerated to the goal rate.  TPN to contain standard multivitamins and trace elements.  Continue CBGs and sensitive SSI q6h.  TPN lab panels on Mondays & Thursdays.  F/u daily - BMET tomorrow  EDolly RiasRPh 04/09/2015, 8:42 AM Pager [phone]

## 2015-04-10 ENCOUNTER — Encounter (HOSPITAL_COMMUNITY): Payer: Self-pay | Admitting: Internal Medicine

## 2015-04-10 DIAGNOSIS — W19XXXS Unspecified fall, sequela: Secondary | ICD-10-CM

## 2015-04-10 DIAGNOSIS — C785 Secondary malignant neoplasm of large intestine and rectum: Secondary | ICD-10-CM

## 2015-04-10 DIAGNOSIS — R627 Adult failure to thrive: Secondary | ICD-10-CM | POA: Diagnosis present

## 2015-04-10 DIAGNOSIS — E43 Unspecified severe protein-calorie malnutrition: Secondary | ICD-10-CM

## 2015-04-10 LAB — CULTURE, BODY FLUID W GRAM STAIN -BOTTLE: Culture: NO GROWTH

## 2015-04-10 LAB — BASIC METABOLIC PANEL
Anion gap: 9 (ref 5–15)
BUN: 24 mg/dL — AB (ref 6–20)
CALCIUM: 8.5 mg/dL — AB (ref 8.9–10.3)
CO2: 26 mmol/L (ref 22–32)
Chloride: 105 mmol/L (ref 101–111)
Creatinine, Ser: 1.54 mg/dL — ABNORMAL HIGH (ref 0.61–1.24)
GFR calc Af Amer: 54 mL/min — ABNORMAL LOW (ref >60)
GFR calc non Af Amer: 47 mL/min — ABNORMAL LOW (ref >60)
GLUCOSE: 189 mg/dL — AB (ref 65–99)
POTASSIUM: 3.7 mmol/L (ref 3.5–5.1)
Sodium: 140 mmol/L (ref 135–145)

## 2015-04-10 LAB — CBC
HEMATOCRIT: 35.4 % — AB (ref 39.0–52.0)
Hemoglobin: 11 g/dL — ABNORMAL LOW (ref 13.0–17.0)
MCH: 32.2 pg (ref 26.0–34.0)
MCHC: 31.1 g/dL (ref 30.0–36.0)
MCV: 103.5 fL — AB (ref 78.0–100.0)
Platelets: 414 10*3/uL — ABNORMAL HIGH (ref 150–400)
RBC: 3.42 MIL/uL — AB (ref 4.22–5.81)
RDW: 15.3 % (ref 11.5–15.5)
WBC: 9.7 10*3/uL (ref 4.0–10.5)

## 2015-04-10 LAB — GLUCOSE, CAPILLARY
GLUCOSE-CAPILLARY: 197 mg/dL — AB (ref 65–99)
GLUCOSE-CAPILLARY: 171 mg/dL — AB (ref 65–99)
Glucose-Capillary: 172 mg/dL — ABNORMAL HIGH (ref 65–99)
Glucose-Capillary: 145 mg/dL — ABNORMAL HIGH (ref 65–99)

## 2015-04-10 LAB — CULTURE, BODY FLUID-BOTTLE

## 2015-04-10 LAB — MAGNESIUM: Magnesium: 2.4 mg/dL (ref 1.7–2.4)

## 2015-04-10 LAB — PHOSPHORUS: Phosphorus: 3.9 mg/dL (ref 2.5–4.6)

## 2015-04-10 MED ORDER — INSULIN ASPART 100 UNIT/ML ~~LOC~~ SOLN
0.0000 [IU] | Freq: Four times a day (QID) | SUBCUTANEOUS | Status: DC
  Administered 2015-04-10: 3 [IU] via SUBCUTANEOUS
  Administered 2015-04-10: 2 [IU] via SUBCUTANEOUS
  Administered 2015-04-11: 5 [IU] via SUBCUTANEOUS
  Administered 2015-04-11: 3 [IU] via SUBCUTANEOUS

## 2015-04-10 MED ORDER — FAT EMULSION 20 % IV EMUL
240.0000 mL | INTRAVENOUS | Status: AC
  Administered 2015-04-10: 240 mL via INTRAVENOUS
  Filled 2015-04-10: qty 240

## 2015-04-10 MED ORDER — METHOCARBAMOL 1000 MG/10ML IJ SOLN
500.0000 mg | Freq: Once | INTRAVENOUS | Status: AC
  Administered 2015-04-10: 500 mg via INTRAVENOUS
  Filled 2015-04-10: qty 5

## 2015-04-10 MED ORDER — TRACE MINERALS CR-CU-MN-SE-ZN 10-1000-500-60 MCG/ML IV SOLN
INTRAVENOUS | Status: AC
Start: 2015-04-10 — End: 2015-04-11
  Administered 2015-04-10: 18:00:00 via INTRAVENOUS
  Filled 2015-04-10: qty 1992

## 2015-04-10 MED ORDER — FENTANYL CITRATE (PF) 100 MCG/2ML IJ SOLN
50.0000 ug | INTRAMUSCULAR | Status: DC | PRN
  Administered 2015-04-10 – 2015-04-12 (×6): 50 ug via INTRAVENOUS
  Filled 2015-04-10 (×7): qty 2

## 2015-04-10 MED ORDER — FENTANYL CITRATE (PF) 100 MCG/2ML IJ SOLN
50.0000 ug | INTRAMUSCULAR | Status: DC | PRN
  Administered 2015-04-10 (×2): 50 ug via INTRAVENOUS
  Filled 2015-04-10 (×2): qty 2

## 2015-04-10 NOTE — Progress Notes (Signed)
Patient ID: Donald Fry, male   DOB: March 06, 1956, 59 y.o.   MRN: [id number] [facility] PROGRESS NOTE  Donald Fry TDV:[id number] DOB: August 12, 1955 DOA: 03/26/2015 PCP: [name], MD  Brief narrative:    59 year old white male with known metastatic colon cancer, s/p resection in March, on systemic chemotherapy presented for evaluation of ongoing abdominal pain of several weeks in duration, has had recent upper endoscopy which was unremarkable. In [facility] ED, pt was noted to have ascites and has underwent paracentesis with 1.7 L fluid removed, based on fluid analysis malignant cells seen. Due to concern for developing PNA pt was started on vancomycin and zosyn. CT chest was also notable for moderate left pleural effusion for which he underwent thoracentesis, again fluid analysis with malignant cells present.   Barrier to discharge: Plan for chemo early next week however in my discussion with his family (brothers, sisters in law, wife) pt does not seem to be ready for chemo. He says he is tired. He is sleeping at this time so not participating in conversation at the bedside. I have encouraged the family to talk with him about his wishes and most importantly not to pressure him into making certain decisions. Pt apparently told them he does not feel he wants to go forward with chemo and just wants to go home.  Assessment/Plan:    Principal Problem:  Abdominal pain with malignant ascites / Colon cancer metastasized to multiple sites [facility]) / abdominal distention / abnormal LFT's / small gastric ulcer /  Adynamic ileus / Encounter for palliative care - Abdominal pain in pt with metastatic colorectal ca with mets to multiple sites in addition to malignant ascites - he has had CT abdomen on admission which demonstrated questionable small gastric ulcer. Also seen was increase in ascites somewhat complex suggesting the possibility of peritonitis. Please note he was on broad spectrum abx (see below).  All abx stopped at this point. There was also possible mild ileus seen on CT scan. He has had NGT from 11/3 through 11/4. Pt asked to have NGT out. - Pt underwent paracentesis 10/31 with 1.7 L fluid removed, malignant cells seen on fluid analysis.  - MRI abdomen was done 04/05/2015 multiple loculated fluid collections, including within the perihepatic space and left abdomen suspicious for infected ascites, developing abscess or abscesses that cannot be excluded, upper abdominal adenopathy which could be reactive or metastatic; adynamic ileus  - Dr. Marin Olp of oncology plans chemo next week but pt seems to be unprepared for chemo. He is tired and feels he only wants to go home  - PCT on board and continues to address goals of care  - Continue TPN for nutritional support   Active Problems:   Fall - Fell 11/5 night - CT head showed no acute findings   Multifocal PNA, RML and RLL, LLL  - Vanco and zosyn stopped 11/03 - Steroids stopped per oncology     Oral thrush - Was on diflucan, stopped by oncology    Acute pancreatitis  - Has had elevated lipase but per oncology, does not really look likely pancreatitis    Hypokalemia - Secondary to GI losses  - Supplemented    Acute kidney injury - Secondary to GI losses, dehydration - Has residual volume on bladder scan so foley inserted  - Cr stable at 1.5   Left pleural effusion, malignant  - S/P thoracentesis 11/01 ~ 300 cc fluid removed 11/01, fluid analysis demonstrated malignant cells    Anemia of  chronic disease - Secondary to malignancy - Hemoglobin stable    Protein calorie malnutrition, severe / Failure to thrive in adult  - In the setting of acute on chronic and progressive cancer - on TPN  DVT Prophylaxis  - SCD's bilaterally in hospital   Code Status: DNR/DNI  Family Communication:  plan of care discussed with the patient and his wife and his brother at the bedside  Disposition Plan: depending on if pt wants chemo  he may need to stay in hospital for next few days.  IV access:  Peripheral IV  Procedures and diagnostic studies:     Dg Abd 2 Views 04/07/2015  1. No evidence of bowel distention. No definitive evidence of a free intraperitoneal air. Small lucencies noted projected over the right lung base/upper most portion of the right abdomen are most likely in the lung and related to adjacent atelectasis. Continued follow-up abdominal series suggested. 2.  Small left pleural effusion.   Ct Head Wo Contrast 04/09/2015  1. Mild atrophy without acute intracranial process. 2. Trace air-fluid level within the left sphenoid sinus, nonspecific though could be seen in the setting of acute sinusitis.  Dg Eye Foreign Body 04/05/2015 No evidence of metallic foreign body within the orbits.   Dg Chest 1 View 04/05/2015 No pneumothorax following left thoracentesis. Small bilateral effusions with bibasilar atelectasis.  US Thoracentesis Asp Pleural Space W/img Guide 04/05/2015 Successful ultrasound-guided diagnostic and therapeutic left sided thoracentesis yielding 320 cc of pleural fluid.   Mr Abdomen W Wo Contrast 04/05/2015 Mild motion degradation. 2. Given this factor, no evidence of hepatic metastasis. 3. Multiple loculated fluid collections, including within the perihepatic space and left abdomen. These are suspicious for infected ascites. Developing abscess or abscesses cannot be excluded. 4. Bilateral pleural effusions with adjacent airspace disease. 5. Subtle pancreatic duct dilatation with an abrupt cut off in the region the pancreatic head. Although no dominant mass is seen in this area, there is hypo enhancement. Given limitations of the current exam, and comorbidities, consider follow-up with pancreatic protocol CT at 6-8 weeks (ideally after the patient is recovered from the acute episode) to exclude a non border deforming adenocarcinoma. 6. Apparent tiny filling defect in the portal vein could be artifactual,  given absence of thrombus on the 03/09/2015 CT. Recommend attention on follow-up. 7. Mild prominence of small bowel loops, favoring adynamic ileus. 8. Upper abdominal adenopathy which could be reactive or metastatic.   US Paracentesis 04/04/2015 Successful ultrasound-guided diagnostic and therapeutic paracentesis yielding 1.7 liters of peritoneal fluid.   Ct Chest W ContrastCt Angio Chest Pe W/cm &/or Wo Cm 03/21/2015 Similar areas of scattered airspace disease in the right middle and lower lobe. This could represent atelectasis or pneumonia or pneumonitis. Small but slightly increased right pleural effusion. 2. Moderate left pleural effusion representing a change from the prior study. New left lower lobe consolidation could represent pneumonia/pneumonitis or atelectasis 3. No evidence of pulmonary embolism 4. Again identified is a tiny focus of gas adjacent to the gastric antrum. As previously described, this may represent a small gastric ulcer. 5. Although there is no other evidence of pneumoperitoneum, there has been a significant increase in the volume of ascites in the abdomen and upper pelvis. The ascites appears somewhat complex as described above suggesting the possibility of peritonitis. 6. Multiple small nonenhancing liver lesions stable. 7. Anterior margin of the pancreas appears somewhat indistinct. This could reflect pancreatitis, but the finding is equivocal and may be related to the presence of  fluid in the lesser sac. 8. Relative to decompressed colon, numerous fluid-filled loops of small bowel are mildly prominent but still within normal limits. This likely reflects mild ileus.   Ct Abdomen Pelvis W Contrast 03/23/2015  1. Similar areas of scattered airspace disease in the right middle and lower lobe. This could represent atelectasis or pneumonia or pneumonitis. Small but slightly increased right pleural effusion. 2. Moderate left pleural effusion representing a change from the prior study.  New left lower lobe consolidation could represent pneumonia/pneumonitis or atelectasis 3. No evidence of pulmonary embolism 4. Again identified is a tiny focus of gas adjacent to the gastric antrum. As previously described, this may represent a small gastric ulcer. 5. Although there is no other evidence of pneumoperitoneum, there has been a significant increase in the volume of ascites in the abdomen and upper pelvis. The ascites appears somewhat complex as described above suggesting the possibility of peritonitis. 6. Multiple small nonenhancing liver lesions stable. 7. Anterior margin of the pancreas appears somewhat indistinct. This could reflect pancreatitis, but the finding is equivocal and may be related to the presence of fluid in the lesser sac. 8. Relative to decompressed colon, numerous fluid-filled loops of small bowel are mildly prominent but still within normal limits. This likely reflects mild ileus. Critical Value/emergent results were called by telephone at the time of interpretation on 03/09/2015 at 10:29 pm to Dr. Merrily Pew , who verbally acknowledged these results. Electronically Signed   By: Skipper Cliche M.D.   On: 03/14/2015 22:29   Medical Consultants:  IR Oncology Surgery  PCT  Other Consultants:  PT Nutrition  IAnti-Infectives:   Vanco and zosyn   Leisa Lenz, MD  [facility] Pager [phone]  Time spent in minutes: 25 minutes  If 7PM-7AM, please contact night-coverage [email or web address] Password [email or web address] 04/10/2015, 11:21 AM   LOS: 7 days    HPI/Subjective: No acute overnight events. Patient reports he is tired, has had no sleep last night. He fell last night.   Objective: Filed Vitals:   04/09/15 2302 04/10/15 0100 04/10/15 0226 04/10/15 0506  BP: 150/98 163/97  131/88  Pulse: 126 111  97  Temp: 98.2 F (36.8 C)  98 F (36.7 C) 97.8 F (36.6 C)  TempSrc: Oral  Oral Axillary  Resp: 20 20  16   Height:      Weight:    91.4 kg (201 lb 8 oz)  SpO2:  98% 94%  95%    Intake/Output Summary (Last 24 hours) at 04/10/15 1121 Last data filed at 04/10/15 0947  Gross per 24 hour  Intake 1105.67 ml  Output    875 ml  Net 230.67 ml    Exam:   General:  Pt is alert, follows commands appropriately, not in acute distress  Cardiovascular: Regular rate and rhythm, S1/S2 (+)  Respiratory: Clear to auscultation bilaterally, no wheezing, no crackles, no rhonchi  Abdomen: distended, bowel sounds sluggish   Extremities: No edema, pulses DP and PT palpable bilaterally  Neuro: Grossly nonfocal  Data Reviewed: Basic Metabolic Panel:  Recent Labs Lab 04/06/15 0405 04/07/15 0735 04/08/15 0600 04/09/15 0535 04/10/15 0410  NA 137 138 141 141 140  K 3.5 3.1* 3.4* 3.4* 3.7  CL 101 102 105 106 105  CO2 25 24 26 25 26   GLUCOSE 123* 113* 170* 157* 189*  BUN 8 10 16  24* 24*  CREATININE 0.58* 0.78 1.32* 1.52* 1.54*  CALCIUM 8.4* 8.5* 9.0 9.1 8.5*  MG  --  2.1 2.3 2.6*  2.4  PHOS  --  3.1 4.2 4.7* 3.9   Liver Function Tests:  Recent Labs Lab 03/31/2015 1850 04/04/15 0239 04/06/15 0405 04/07/15 0735 04/08/15 0600  AST 58* 58* 37 50* 41  ALT 123* 116* 69* 69* 61  ALKPHOS 321* 325* 283* 299* 285*  BILITOT 1.2 1.5* 1.2 1.0 0.4  PROT 7.2 6.9 6.1* 6.3* 6.4*  ALBUMIN 3.1* 2.8* 2.4* 2.6* 2.6*    Recent Labs Lab 03/31/2015 1850 04/05/15 0255 04/06/15 0405 04/07/15 0735  LIPASE 63* 74* 52* 72*   No results for input(s): AMMONIA in the last 168 hours. CBC:  Recent Labs Lab 03/15/2015 1850  04/06/15 0405 04/07/15 0335 04/08/15 0600 04/09/15 0535 04/10/15 0410  WBC 5.8  < > 7.4 7.7 8.5 7.9 9.7  NEUTROABS 3.7  --   --   --  6.4  --   --   HGB 13.4  < > 12.1* 11.5* 11.7* 11.6* 11.0*  HCT 41.8  < > 36.7* 35.9* 37.1* 36.9* 35.4*  MCV 102.5*  < > 100.3* 101.4* 102.2* 101.9* 103.5*  PLT 505*  < > 399 434* 403* 424* 414*  < > = values in this interval not displayed. Cardiac Enzymes: No results for input(s): CKTOTAL, CKMB, CKMBINDEX,  TROPONINI in the last 168 hours. BNP: Invalid input(s): POCBNP CBG:  Recent Labs Lab 04/09/15 0536 04/09/15 1200 04/09/15 1705 04/09/15 2347 04/10/15 0510  GLUCAP 126* 147* 148* 167* 197*    Recent Results (from the past 240 hour(s))  C difficile quick scan w PCR reflex     Status: None   Collection Time: 04/04/15 10:26 AM  Result Value Ref Range Status   C Diff antigen NEGATIVE NEGATIVE Final   C Diff toxin NEGATIVE NEGATIVE Final   C Diff interpretation Negative for toxigenic C. difficile  Final  Culture, body fluid-bottle     Status: None   Collection Time: 04/04/15 11:13 AM  Result Value Ref Range Status   Specimen Description FLUID ASCITIC  Final   Special Requests NONE  Final   Culture   Final    NO GROWTH 5 DAYS Performed at [facility]    Report Status 04/09/2015 FINAL  Final  Gram stain     Status: None   Collection Time: 04/04/15 11:13 AM  Result Value Ref Range Status   Specimen Description FLUID ASCITIC  Final   Special Requests NONE  Final   Gram Stain   Final    FEW WBC PRESENT, PREDOMINANTLY MONONUCLEAR NO ORGANISMS SEEN Performed at [facility]    Report Status 04/04/2015 FINAL  Final  Culture, body fluid-bottle     Status: None   Collection Time: 04/05/15  3:28 PM  Result Value Ref Range Status   Specimen Description FLUID PLEURAL  Final   Special Requests BOTTLES DRAWN AEROBIC AND ANAEROBIC 10CC  Final   Culture   Final    NO GROWTH 5 DAYS Performed at [facility]    Report Status 04/10/2015 FINAL  Final  Gram stain     Status: None   Collection Time: 04/05/15  3:28 PM  Result Value Ref Range Status   Specimen Description FLUID PLEURAL  Final   Special Requests NONE  Final   Gram Stain   Final    CYTOSPIN SMEAR WBC PRESENT,BOTH PMN AND MONONUCLEAR NO ORGANISMS SEEN Performed at [facility]    Report Status 04/05/2015 FINAL  Final     Scheduled Meds: . antiseptic oral rinse  7 mL Mouth Rinse BID  .  atenolol  100 mg Oral Daily  . bisacodyl  10 mg Rectal TID  . fentaNYL  25 mcg Transdermal Q72H  . heparin lock flush  500 Units Intracatheter Q30 days  . insulin aspart  0-15 Units Subcutaneous 4 times per day  . LORazepam  2 mg Intravenous Q6H  . magic mouthwash  10 mL Oral TID  . metoCLOPramide (REGLAN) injection  20 mg Intravenous 4 times per day  . pantoprazole (PROTONIX) IV  40 mg Intravenous Q12H  . sodium chloride  10-40 mL Intracatheter Q12H   Continuous Infusions: . Marland KitchenTPN (CLINIMIX-E) Adult 60 mL/hr at 04/09/15 1838   And  . fat emulsion 240 mL (04/09/15 1838)  . Marland KitchenTPN (CLINIMIX-E) Adult     And  . fat emulsion

## 2015-04-10 NOTE — Progress Notes (Signed)
Pt with c/o of pain to lower abdomen but holds penis and jerks at the same time. MD made aware. Order given for robaxin IV. Vwilliams, rn.

## 2015-04-10 NOTE — Progress Notes (Signed)
PARENTERAL NUTRITION CONSULT NOTE -   Pharmacy Consult for TPN Indication: Prolonged Ileus  No Known Allergies  Patient Measurements: Height: _0  (165.1 cm) Weight: 201 lb 8 oz (91.4 kg) IBW/kg (Calculated) : 61.5  Vital Signs: Temp: 97.8 F (36.6 C) (11/06 0506) Temp Source: Axillary (11/06 0506) BP: 131/88 mmHg (11/06 0506) Pulse Rate: 97 (11/06 0506) Intake/Output from previous day: 11/05 0701 - 11/06 0700 In: 1105.7 [P.O.:60; I.V.:660; IV Piggyback:150; TPN:235.7] Out: 875 [Urine:875] Intake/Output from this shift: Total I/O In: -  Out: 50 [Urine:50]  Labs:  Recent Labs  04/08/15 0600 04/09/15 0535 04/10/15 0410  WBC 8.5 7.9 9.7  HGB 11.7* 11.6* 11.0*  HCT 37.1* 36.9* 35.4*  PLT 403* 424* 414*     Recent Labs  04/08/15 0600 04/09/15 0535 04/10/15 0410  NA 141 141 140  K 3.4* 3.4* 3.7  CL 105 106 105  CO2 _1 GLUCOSE 170* 157* 189*  BUN 16 24* 24*  CREATININE 1.32* 1.52* 1.54*  CALCIUM 9.0 9.1 8.5*  MG 2.3 2.6* 2.4  PHOS 4.2 4.7* 3.9  PROT 6.4*  --   --   ALBUMIN 2.6*  --   --   AST 41  --   --   ALT 61  --   --   ALKPHOS 285*  --   --   BILITOT 0.4  --   --   PREALBUMIN 9.6*  --   --   TRIG 210*  --   --    Estimated Creatinine Clearance: 52.4 mL/min (by C-G formula based on Cr of 1.54).    Recent Labs  04/09/15 1705 04/09/15 2347 04/10/15 0510  GLUCAP 148* 167* 197*    Medical History: Past Medical History  Diagnosis Date  . Colon cancer metastasized to multiple sites ([place]) 08/20/2014    Medications:  Infusions:  . Marland KitchenTPN (CLINIMIX-E) Adult 60 mL/hr at 04/09/15 6122   And  . fat emulsion 240 mL (04/09/15 1838)    Insulin Requirements in the past 24 hours: 7 units Novolog (sensitive SSI)  Current Nutrition: NPO  IVF: none  Central access: Implanted Port (08/16/14) TPN start date: 11/3  ASSESSMENT                                                                                                          HPI: 20 yoM  presented to [facility] ED on 10/30 and was admitted to [facility] on 10/31 with 3 weeks of progressive worsening abdominal pain and "bloating". PMH significant for stage 4 colon cancer with extensive peritoneal disease s/p resection and chemo (last in September) with a plan to transition to oral maintenance chemotherapy, and remote hx of bile leak following cholecystectomy. No indication for surgery at this time. RD notes poor oral intake for ~3 weeks and has been unable to tolerate PO since admission.  Pharmacy is consulted to start TPN.  He will be high risk for refeeding with very limited PO intake over the last several weeks.  Significant events:  11/5: 0500-patient fell, TPN was disconnected,  running d10w till new bag arrives tonight  Today:   Glucose - elevated after starting TPN, remains > 160, monitor SSI usage  Electrolytes - WNL,  CoCa  WNL.   Renal - SCr continues to increase (0.78 > 1.54)  LFTs - Alk phos, Lipase, AST, ALT all elevated. Tbili WNL.  TGs - 210  Prealbumin - 9.6  NUTRITIONAL GOALS                                                                                             RD recs (11/2): 110-125 g protein/day, 2300-2525 Kcal/day  Clinimix 5/20 at a goal rate of 83 ml/hr + 20% fat emulsion at 10 ml/hr to provide: 100 g/day protein, 2233 Kcal/day. - Meets 91% of minimum protein needs and 97% of minimum Kcal needs.  This rate will maximize the use of a 2L bag and minimize waste.  If further protein supplementation is needed, may increase rate to 90-100 ml/hr.  PLAN                                                                                                                          At 1800 today:  Increase Clinimix E 5/20 to goal of 83 ml/hr.  20% fat emulsion at 10 ml/hr.  TPN to contain standard multivitamins and trace elements.  Change SSI to moderate q6h  TPN lab panels on Mondays & Thursdays.  F/u daily   Dolly Rias RPh 04/10/2015, 9:18 AM Pager  [phone]

## 2015-04-10 NOTE — Progress Notes (Signed)
Pt's wife at bedside and voiced concerns about pt being confused and thinks it's related to the IV dilaudid that pt takes. MD made aware. New order given for fentanyl. Vwilliams,rn.

## 2015-04-11 ENCOUNTER — Telehealth: Payer: Self-pay

## 2015-04-11 ENCOUNTER — Inpatient Hospital Stay (HOSPITAL_COMMUNITY): Payer: Medicaid Other

## 2015-04-11 DIAGNOSIS — F4323 Adjustment disorder with mixed anxiety and depressed mood: Secondary | ICD-10-CM | POA: Insufficient documentation

## 2015-04-11 DIAGNOSIS — R627 Adult failure to thrive: Secondary | ICD-10-CM

## 2015-04-11 LAB — DIFFERENTIAL
BASOS ABS: 0.1 10*3/uL (ref 0.0–0.1)
BASOS PCT: 0 %
EOS ABS: 0.2 10*3/uL (ref 0.0–0.7)
EOS PCT: 1 %
LYMPHS PCT: 11 %
Lymphs Abs: 1.5 10*3/uL (ref 0.7–4.0)
Monocytes Absolute: 1.2 10*3/uL — ABNORMAL HIGH (ref 0.1–1.0)
Monocytes Relative: 9 %
NEUTROS ABS: 10.5 10*3/uL — AB (ref 1.7–7.7)
Neutrophils Relative %: 79 %

## 2015-04-11 LAB — CBC
HEMATOCRIT: 34.4 % — AB (ref 39.0–52.0)
HEMOGLOBIN: 10.8 g/dL — AB (ref 13.0–17.0)
MCH: 32 pg (ref 26.0–34.0)
MCHC: 31.4 g/dL (ref 30.0–36.0)
MCV: 102.1 fL — ABNORMAL HIGH (ref 78.0–100.0)
Platelets: 404 10*3/uL — ABNORMAL HIGH (ref 150–400)
RBC: 3.37 MIL/uL — ABNORMAL LOW (ref 4.22–5.81)
RDW: 15.5 % (ref 11.5–15.5)
WBC: 13.4 10*3/uL — AB (ref 4.0–10.5)

## 2015-04-11 LAB — COMPREHENSIVE METABOLIC PANEL
ALBUMIN: 2.2 g/dL — AB (ref 3.5–5.0)
ALT: 80 U/L — AB (ref 17–63)
ANION GAP: 9 (ref 5–15)
AST: 67 U/L — ABNORMAL HIGH (ref 15–41)
Alkaline Phosphatase: 267 U/L — ABNORMAL HIGH (ref 38–126)
BUN: 34 mg/dL — ABNORMAL HIGH (ref 6–20)
CHLORIDE: 106 mmol/L (ref 101–111)
CO2: 24 mmol/L (ref 22–32)
Calcium: 8.5 mg/dL — ABNORMAL LOW (ref 8.9–10.3)
Creatinine, Ser: 1.39 mg/dL — ABNORMAL HIGH (ref 0.61–1.24)
GFR calc non Af Amer: 53 mL/min — ABNORMAL LOW (ref >60)
Glucose, Bld: 200 mg/dL — ABNORMAL HIGH (ref 65–99)
Potassium: 3.9 mmol/L (ref 3.5–5.1)
SODIUM: 139 mmol/L (ref 135–145)
Total Bilirubin: 0.9 mg/dL (ref 0.3–1.2)
Total Protein: 5.9 g/dL — ABNORMAL LOW (ref 6.5–8.1)

## 2015-04-11 LAB — GLUCOSE, CAPILLARY
GLUCOSE-CAPILLARY: 245 mg/dL — AB (ref 65–99)
GLUCOSE-CAPILLARY: 258 mg/dL — AB (ref 65–99)
GLUCOSE-CAPILLARY: 217 mg/dL — AB (ref 65–99)
GLUCOSE-CAPILLARY: 198 mg/dL — AB (ref 65–99)
Glucose-Capillary: 195 mg/dL — ABNORMAL HIGH (ref 65–99)
Glucose-Capillary: 208 mg/dL — ABNORMAL HIGH (ref 65–99)
Glucose-Capillary: 225 mg/dL — ABNORMAL HIGH (ref 65–99)
Glucose-Capillary: 230 mg/dL — ABNORMAL HIGH (ref 65–99)

## 2015-04-11 LAB — PHOSPHORUS: PHOSPHORUS: 3.9 mg/dL (ref 2.5–4.6)

## 2015-04-11 LAB — PREALBUMIN: PREALBUMIN: 6.1 mg/dL — AB (ref 18–38)

## 2015-04-11 LAB — TRIGLYCERIDES: Triglycerides: 171 mg/dL — ABNORMAL HIGH (ref <150)

## 2015-04-11 LAB — MAGNESIUM: Magnesium: 2.6 mg/dL — ABNORMAL HIGH (ref 1.7–2.4)

## 2015-04-11 MED ORDER — MIRTAZAPINE 7.5 MG PO TABS
7.5000 mg | ORAL_TABLET | Freq: Two times a day (BID) | ORAL | Status: DC
  Administered 2015-04-11: 7.5 mg via ORAL
  Filled 2015-04-11 (×5): qty 1

## 2015-04-11 MED ORDER — HALOPERIDOL LACTATE 5 MG/ML IJ SOLN
5.0000 mg | Freq: Once | INTRAMUSCULAR | Status: AC
  Administered 2015-04-11: 5 mg via INTRAVENOUS
  Filled 2015-04-11: qty 1

## 2015-04-11 MED ORDER — LORAZEPAM 2 MG/ML IJ SOLN
4.0000 mg | INTRAMUSCULAR | Status: DC
  Administered 2015-04-11 – 2015-04-12 (×6): 4 mg via INTRAVENOUS
  Filled 2015-04-11 (×6): qty 2

## 2015-04-11 MED ORDER — INSULIN ASPART 100 UNIT/ML ~~LOC~~ SOLN
0.0000 [IU] | SUBCUTANEOUS | Status: DC
  Administered 2015-04-11: 3 [IU] via SUBCUTANEOUS
  Administered 2015-04-11: 5 [IU] via SUBCUTANEOUS
  Administered 2015-04-11: 8 [IU] via SUBCUTANEOUS
  Administered 2015-04-12 (×3): 5 [IU] via SUBCUTANEOUS

## 2015-04-11 MED ORDER — HALOPERIDOL LACTATE 5 MG/ML IJ SOLN
5.0000 mg | Freq: Four times a day (QID) | INTRAMUSCULAR | Status: DC | PRN
  Administered 2015-04-11: 5 mg via INTRAVENOUS
  Filled 2015-04-11: qty 1

## 2015-04-11 MED ORDER — TRACE MINERALS CR-CU-MN-SE-ZN 10-1000-500-60 MCG/ML IV SOLN
INTRAVENOUS | Status: DC
  Administered 2015-04-11: 17:00:00 via INTRAVENOUS
  Filled 2015-04-11: qty 1992

## 2015-04-11 MED ORDER — FAT EMULSION 20 % IV EMUL
240.0000 mL | INTRAVENOUS | Status: DC
  Administered 2015-04-11 – 2015-04-12 (×2): 240 mL via INTRAVENOUS
  Filled 2015-04-11 (×3): qty 250

## 2015-04-11 MED ORDER — ALBUTEROL SULFATE (2.5 MG/3ML) 0.083% IN NEBU
2.5000 mg | INHALATION_SOLUTION | RESPIRATORY_TRACT | Status: DC | PRN
  Administered 2015-04-11 – 2015-04-12 (×3): 2.5 mg via RESPIRATORY_TRACT
  Filled 2015-04-11 (×3): qty 3

## 2015-04-11 MED ORDER — TEMAZEPAM 15 MG PO CAPS: 15.0000 mg | ORAL_CAPSULE | Freq: Every day | ORAL | Status: DC

## 2015-04-11 MED ORDER — HALOPERIDOL LACTATE 5 MG/ML IJ SOLN
5.0000 mg | Freq: Two times a day (BID) | INTRAMUSCULAR | Status: DC
  Administered 2015-04-11 – 2015-04-12 (×2): 5 mg via INTRAVENOUS
  Filled 2015-04-11 (×6): qty 1

## 2015-04-11 NOTE — Progress Notes (Signed)
Donald Fry had a tough weekend. It looks like he is having some more vomiting. He's bringing up bilious type of material that is thick. I suspect that this probably is just undigested material because he cannot move secretions through his intestines. He had an episode of vomiting this morning. He does not want to have an NG tube. I suspect that the NG tube will help him.  He also is quite agitated. He is on Ativan.  I did start him on a Duragesic patch on Friday. Pain does not seem to be as much of a problem.  As far as any chemotherapy goes, I just am concerned that he is not going to be able to tolerate it. I realize this might be the only way to help him. However, I also have to be concerned about toxicity.  His prealbumin is less than 10.. This is a very ominous prognostic factor.  His labs today look about the same. His albumin is 2.2. His creatinine is 1.4.  He now has a Foley catheter in.  His calcium is a little on the higher side. When his calcium is corrected for his albumin, his corrected calcium is 10.3.  On his physical exam, his blood pressure is 141/89. Temperature 90.8 degrees. His abdomen is still distended. He has minimal bowel sounds. Cardiac exam is regular rate and rhythm. Extremities shows no edema. Neurological exam is nonfocal.  Over the past 3 days, Donald Fry has declined my opinion significantly.  I just would be incredibly shocked if he were to make any progress in order that he would be able to tolerate treatment.  He really needs to have another NG tube placed but again, he really does not wish to have this.  The agitation could certainly be from his malignancy. Possibly some Haldol might help with this.  I really think that the next 2-3 days will tell us which way he is headed. I really think that if he declines further, his prognosis will easily be less than 3-4 weeks.  At some point, the role of TNA will have to be discussed with the family. I might sure this  really is going to provide any benefit for him in the future.  I spent about 40 minutes with he and his wife this morning. She is doing her best to try to help him. I appreciate all the great care that the staff up on [address] is giving him.   Pete E.  2 Timothy 4:6-8.

## 2015-04-11 NOTE — Progress Notes (Signed)
PARENTERAL NUTRITION CONSULT NOTE -   Pharmacy Consult for TPN Indication: Prolonged Ileus  No Known Allergies  Patient Measurements: Height: 5' 5" (165.1 cm) Weight: 201 lb 8 oz (91.4 kg) IBW/kg (Calculated) : 61.5  Vital Signs: Temp: 97.9 F (36.6 C) (11/07 0400) Temp Source: Oral (11/07 0400) BP: 141/89 mmHg (11/07 0400) Pulse Rate: 94 (11/07 0400) Intake/Output from previous day: 11/06 0701 - 11/07 0700 In: 108 [IV Piggyback:108] Out: 1175 [Urine:1175] Intake/Output from this shift:    Labs:  Recent Labs  04/09/15 0535 04/10/15 0410 04/11/15 0430  WBC 7.9 9.7 13.4*  HGB 11.6* 11.0* 10.8*  HCT 36.9* 35.4* 34.4*  PLT 424* 414* 404*     Recent Labs  04/09/15 0535 04/10/15 0410 04/11/15 0430  NA 141 140 139  K 3.4* 3.7 3.9  CL 106 105 106  CO2 25 26 24  GLUCOSE 157* 189* 200*  BUN 24* 24* 34*  CREATININE 1.52* 1.54* 1.39*  CALCIUM 9.1 8.5* 8.5*  MG 2.6* 2.4 2.6*  PHOS 4.7* 3.9 3.9  PROT  --   --  5.9*  ALBUMIN  --   --  2.2*  AST  --   --  67*  ALT  --   --  80*  ALKPHOS  --   --  267*  BILITOT  --   --  0.9  PREALBUMIN  --   --  6.1*  TRIG  --   --  171*   Estimated Creatinine Clearance: 58 mL/min (by C-G formula based on Cr of 1.39).    Recent Labs  04/11/15 0411 04/11/15 0622 04/11/15 0745  GLUCAP 195* 230* 245*    Medical History: History reviewed. No pertinent past medical history.  Medications:  Infusions:  . .TPN (CLINIMIX-E) Adult 83 mL/hr at 04/10/15 1748   And  . fat emulsion 240 mL (04/10/15 1748)    Insulin Requirements in the past 24 hours: 15 units Novolog (moderate SSI)  Current Nutrition: NPO  IVF: none  Central access: Implanted Port (08/16/14) TPN start date: 11/3  ASSESSMENT                                                                                                          HPI: 59 yoM presented to [facility] ED on 10/30 and was admitted to [facility] on 10/31 with 3 weeks of progressive worsening abdominal pain and  "bloating". PMH significant for stage 4 colon cancer with extensive peritoneal disease s/p resection and chemo (last in September) with a plan to transition to oral maintenance chemotherapy, and remote hx of bile leak following cholecystectomy. No indication for surgery at this time. RD notes poor oral intake for ~3 weeks and has been unable to tolerate PO since admission.  Pharmacy is consulted to start TPN.  He will be high risk for refeeding with very limited PO intake over the last several weeks.  Significant events:  11/4: had NGT from 11/3 through 11/4. Pt asked to have NGT out. 11/5: 0500-patient fell, TPN was disconnected, running d10w till new bag arrived at   1800  Today:   Glucose - elevated and worsened after increasing TPN to goal rate yesterday. Will need to add some insulin to TPN.  If cannot control CBGs with insulin in bag and SSI, will need to reduce dextrose content in TPN (switch Clinimix formulation from 5/20 to 5/15).  Electrolytes - K+/Phos WNL, Mag 2.6, CoCa 9.94.   Renal - SCr elevated but trended down today, BUN increasing, UOP 0.5 mL/kg/hr  LFTs - Alk phos, Lipase, AST, ALT all elevated. Tbili WNL.  TGs - 210 (11/4), 171 (11/7)  Prealbumin - 9.6 (11/4), 6.1 (11/7)  Pt continues to vomit, does not want NG tube  NUTRITIONAL GOALS                                                                                             RD recs (11/4): 110-125 g protein/day, 2300-2525 Kcal/day  Clinimix 5/20 at a goal rate of 83 ml/hr + 20% fat emulsion at 10 ml/hr to provide: 100 g/day protein, 2233 Kcal/day. - Meets 91% of minimum protein needs and 97% of minimum Kcal needs.  This rate will maximize the use of a 2L bag and minimize waste.  If further protein supplementation is needed, may increase rate to 90-100 ml/hr.  PLAN                                                                                                                          At 1800 today:  Continue  Clinimix E 5/20 at 83 ml/hr.   Add 10 units of regular insulin to TPN.  20% fat emulsion at 10 ml/hr.  TPN to contain standard multivitamins and trace elements.  Change SSI moderate scale to q4h  TPN lab panels on Mondays & Thursdays.  F/u daily. BMET, Mag, Phos in AM.   , PharmD, BCPS Pager: [phone] 04/11/2015 10:35 AM     

## 2015-04-11 NOTE — Progress Notes (Addendum)
Patient ID: Donald Fry, male   DOB: 09-22-1955, 59 y.o.   MRN: [id number] [facility] PROGRESS NOTE  BRANDN MCGATH GHW:[id number] DOB: 01/25/1956 DOA: 03/09/2015 PCP: [name], MD  Brief narrative:    59 year old white male with known metastatic colon cancer, s/p resection in March, on systemic chemotherapy presented for evaluation of ongoing abdominal pain of several weeks in duration, has had recent upper endoscopy which was unremarkable. In [facility] ED, pt was noted to have ascites and has underwent paracentesis with 1.7 L fluid removed, based on fluid analysis malignant cells seen. Due to concern for developing PNA pt was started on vancomycin and zosyn. CT chest was also notable for moderate left pleural effusion for which he underwent thoracentesis, again fluid analysis with malignant cells present.   Barrier to discharge: for now, patient does not want chemotherapy. He may require NG tube placement for ongoing nausea and vomiting but at this time he appears to be too anxious to have NG tube placement.  Assessment/Plan:    Principal Problem:  Abdominal pain with malignant ascites / Colon cancer metastasized to multiple sites [facility]) / abdominal distention / abnormal LFT's / small gastric ulcer /  Adynamic ileus / Encounter for palliative care - Patient's abdominal pain is likely due to metastatic colorectal ca with mets to multiple sites in addition to malignant ascites - CT abdomen on admission demonstrated questionable small gastric ulcer. Also seen was increase in ascites somewhat complex suggesting the possibility of peritonitis. Please note he was on broad spectrum abx (see below). All abx stopped. There was also possible mild ileus seen on CT scan. He has had NGT from 11/3 through 11/4. Pt asked to have NGT out. - Pt underwent paracentesis 10/31 with 1.7 L fluid removed, malignant cells seen on fluid analysis.  - MRI abdomen was done 04/05/2015 multiple loculated fluid  collections, including within the perihepatic space and left abdomen suspicious for infected ascites, developing abscess or abscesses that cannot be excluded, upper abdominal adenopathy which could be reactive or metastatic; adynamic ileus. - Patient has intermittent vomiting, bile-like substance. He may need NG tube placement but he is too anxious to have this done at this point. We'll try to control his anxiety prior to NG tube placement. - Dr. Marin Olp of oncology agrees on holding off the chemo tx at this time. - PCT continues to address goals of care  - Continue TPN for nutritional support   Active Problems:   Fall - Fell 11/5 night - CT head - no acute findings   Multifocal PNA, RML and RLL, LLL  - Vanco and zosyn stopped 11/03 - Steroids stopped per oncology     Oral thrush - Pt was on diflucan, stopped by oncology    Acute pancreatitis  - Pt had elevated lipase but per oncology, does not really look likely pancreatitis    Hypokalemia - Secondary to GI losses  - Supplemented and now WNL   Acute kidney injury - Secondary to GI losses, dehydration - Has had residual volume on bladder scan so foley inserted 11/7 - Cr improving, 1.5 --> 1.39   Left pleural effusion, malignant  - S/P thoracentesis 11/01 ~ 300 cc fluid removed 11/01, fluid analysis demonstrated malignant cells    Anemia of chronic disease - Secondary to history of malignancy - Hemoglobin stable at 10.8   Protein calorie malnutrition, severe / Failure to thrive in adult  - In the setting of acute on chronic and progressive cancer - on  TPN for nutritional support which may not really benefit him in long run per oncology. Will need to address this further with his family.   DVT Prophylaxis  - SCD's bilaterally  Code Status: DNR/DNI  Family Communication:  plan of care discussed with the patient and his wife and his brother at the bedside  Disposition Plan: may require NG tube placement cause of  intermittent vomiting.  IV access:  Peripheral IV  Procedures and diagnostic studies:     Dg Abd 2 Views 04/07/2015  1. No evidence of bowel distention. No definitive evidence of a free intraperitoneal air. Small lucencies noted projected over the right lung base/upper most portion of the right abdomen are most likely in the lung and related to adjacent atelectasis. Continued follow-up abdominal series suggested. 2.  Small left pleural effusion.   Ct Head Wo Contrast 04/09/2015  1. Mild atrophy without acute intracranial process. 2. Trace air-fluid level within the left sphenoid sinus, nonspecific though could be seen in the setting of acute sinusitis.  Dg Eye Foreign Body 04/05/2015 No evidence of metallic foreign body within the orbits.   Dg Chest 1 View 04/05/2015 No pneumothorax following left thoracentesis. Small bilateral effusions with bibasilar atelectasis.  US Thoracentesis Asp Pleural Space W/img Guide 04/05/2015 Successful ultrasound-guided diagnostic and therapeutic left sided thoracentesis yielding 320 cc of pleural fluid.   Mr Abdomen W Wo Contrast 04/05/2015 Mild motion degradation. 2. Given this factor, no evidence of hepatic metastasis. 3. Multiple loculated fluid collections, including within the perihepatic space and left abdomen. These are suspicious for infected ascites. Developing abscess or abscesses cannot be excluded. 4. Bilateral pleural effusions with adjacent airspace disease. 5. Subtle pancreatic duct dilatation with an abrupt cut off in the region the pancreatic head. Although no dominant mass is seen in this area, there is hypo enhancement. Given limitations of the current exam, and comorbidities, consider follow-up with pancreatic protocol CT at 6-8 weeks (ideally after the patient is recovered from the acute episode) to exclude a non border deforming adenocarcinoma. 6. Apparent tiny filling defect in the portal vein could be artifactual, given absence of thrombus on the  03/22/2015 CT. Recommend attention on follow-up. 7. Mild prominence of small bowel loops, favoring adynamic ileus. 8. Upper abdominal adenopathy which could be reactive or metastatic.   US Paracentesis 04/04/2015 Successful ultrasound-guided diagnostic and therapeutic paracentesis yielding 1.7 liters of peritoneal fluid.   Ct Chest W ContrastCt Angio Chest Pe W/cm &/or Wo Cm 03/17/2015 Similar areas of scattered airspace disease in the right middle and lower lobe. This could represent atelectasis or pneumonia or pneumonitis. Small but slightly increased right pleural effusion. 2. Moderate left pleural effusion representing a change from the prior study. New left lower lobe consolidation could represent pneumonia/pneumonitis or atelectasis 3. No evidence of pulmonary embolism 4. Again identified is a tiny focus of gas adjacent to the gastric antrum. As previously described, this may represent a small gastric ulcer. 5. Although there is no other evidence of pneumoperitoneum, there has been a significant increase in the volume of ascites in the abdomen and upper pelvis. The ascites appears somewhat complex as described above suggesting the possibility of peritonitis. 6. Multiple small nonenhancing liver lesions stable. 7. Anterior margin of the pancreas appears somewhat indistinct. This could reflect pancreatitis, but the finding is equivocal and may be related to the presence of fluid in the lesser sac. 8. Relative to decompressed colon, numerous fluid-filled loops of small bowel are mildly prominent but still within normal  limits. This likely reflects mild ileus.   Ct Abdomen Pelvis W Contrast 03/09/2015  1. Similar areas of scattered airspace disease in the right middle and lower lobe. This could represent atelectasis or pneumonia or pneumonitis. Small but slightly increased right pleural effusion. 2. Moderate left pleural effusion representing a change from the prior study. New left lower lobe  consolidation could represent pneumonia/pneumonitis or atelectasis 3. No evidence of pulmonary embolism 4. Again identified is a tiny focus of gas adjacent to the gastric antrum. As previously described, this may represent a small gastric ulcer. 5. Although there is no other evidence of pneumoperitoneum, there has been a significant increase in the volume of ascites in the abdomen and upper pelvis. The ascites appears somewhat complex as described above suggesting the possibility of peritonitis. 6. Multiple small nonenhancing liver lesions stable. 7. Anterior margin of the pancreas appears somewhat indistinct. This could reflect pancreatitis, but the finding is equivocal and may be related to the presence of fluid in the lesser sac. 8. Relative to decompressed colon, numerous fluid-filled loops of small bowel are mildly prominent but still within normal limits. This likely reflects mild ileus. Critical Value/emergent results were called by telephone at the time of interpretation on 03/23/2015 at 10:29 pm to Dr. Merrily Pew , who verbally acknowledged these results. Electronically Signed   By: Skipper Cliche M.D.   On: 03/28/2015 22:29   Medical Consultants:  IR Oncology Surgery  PCT  Other Consultants:  PT Nutrition  IAnti-Infectives:   Vanco and zosyn   Leisa Lenz, MD  [facility] Pager [phone]  Time spent in minutes: 25 minutes  If 7PM-7AM, please contact night-coverage [email or web address] Password [email or web address] 04/11/2015, 10:48 AM   LOS: 8 days    HPI/Subjective: No acute overnight events. no reports of pain. He does seem to be anxious per patient's wife reported.   Objective: Filed Vitals:   04/10/15 1415 04/10/15 1959 04/11/15 0053 04/11/15 0400  BP: 124/83 138/84 148/88 141/89  Pulse: 94 97 95 94  Temp: 98.3 F (36.8 C) 98.4 F (36.9 C) 98 F (36.7 C) 97.9 F (36.6 C)  TempSrc: Oral Oral Oral Oral  Resp: 20 20 22 24   Height:      Weight:      SpO2: 97% 94% 97% 96%     Intake/Output Summary (Last 24 hours) at 04/11/15 1048 Last data filed at 04/11/15 0900  Gross per 24 hour  Intake    108 ml  Output   1075 ml  Net   -967 ml    Exam:   General:  Pt is more alert, no distress  Cardiovascular: rate controlled, appreciate S1-S2  Respiratory: no wheezing, no rhonchi  Abdomen: abdomen is distended but not tender, bowel sounds appreciated  Extremities: No lower extremity swelling, pulses palpable  Neuro: No focal deficits  Data Reviewed: Basic Metabolic Panel:  Recent Labs Lab 04/07/15 0735 04/08/15 0600 04/09/15 0535 04/10/15 0410 04/11/15 0430  NA 138 141 141 140 139  K 3.1* 3.4* 3.4* 3.7 3.9  CL 102 105 106 105 106  CO2 24 26 25 26 24   GLUCOSE 113* 170* 157* 189* 200*  BUN 10 16 24* 24* 34*  CREATININE 0.78 1.32* 1.52* 1.54* 1.39*  CALCIUM 8.5* 9.0 9.1 8.5* 8.5*  MG 2.1 2.3 2.6* 2.4 2.6*  PHOS 3.1 4.2 4.7* 3.9 3.9   Liver Function Tests:  Recent Labs Lab 04/06/15 0405 04/07/15 0735 04/08/15 0600 04/11/15 0430  AST 37 50* 41 67*  ALT  69* 69* 61 80*  ALKPHOS 283* 299* 285* 267*  BILITOT 1.2 1.0 0.4 0.9  PROT 6.1* 6.3* 6.4* 5.9*  ALBUMIN 2.4* 2.6* 2.6* 2.2*    Recent Labs Lab 04/05/15 0255 04/06/15 0405 04/07/15 0735  LIPASE 74* 52* 72*   No results for input(s): AMMONIA in the last 168 hours. CBC:  Recent Labs Lab 04/07/15 0335 04/08/15 0600 04/09/15 0535 04/10/15 0410 04/11/15 0430  WBC 7.7 8.5 7.9 9.7 13.4*  NEUTROABS  --  6.4  --   --  10.5*  HGB 11.5* 11.7* 11.6* 11.0* 10.8*  HCT 35.9* 37.1* 36.9* 35.4* 34.4*  MCV 101.4* 102.2* 101.9* 103.5* 102.1*  PLT 434* 403* 424* 414* 404*   Cardiac Enzymes: No results for input(s): CKTOTAL, CKMB, CKMBINDEX, TROPONINI in the last 168 hours. BNP: Invalid input(s): POCBNP CBG:  Recent Labs Lab 04/10/15 1950 04/11/15 0051 04/11/15 0411 04/11/15 0622 04/11/15 0745  GLUCAP 171* 225* 195* 230* 245*    Recent Results (from the past 240 hour(s))   C difficile quick scan w PCR reflex     Status: None   Collection Time: 04/04/15 10:26 AM  Result Value Ref Range Status   C Diff antigen NEGATIVE NEGATIVE Final   C Diff toxin NEGATIVE NEGATIVE Final   C Diff interpretation Negative for toxigenic C. difficile  Final  Culture, body fluid-bottle     Status: None   Collection Time: 04/04/15 11:13 AM  Result Value Ref Range Status   Specimen Description FLUID ASCITIC  Final   Special Requests NONE  Final   Culture   Final    NO GROWTH 5 DAYS Performed at [facility]    Report Status 04/09/2015 FINAL  Final  Gram stain     Status: None   Collection Time: 04/04/15 11:13 AM  Result Value Ref Range Status   Specimen Description FLUID ASCITIC  Final   Special Requests NONE  Final   Gram Stain   Final    FEW WBC PRESENT, PREDOMINANTLY MONONUCLEAR NO ORGANISMS SEEN Performed at [facility]    Report Status 04/04/2015 FINAL  Final  Culture, body fluid-bottle     Status: None   Collection Time: 04/05/15  3:28 PM  Result Value Ref Range Status   Specimen Description FLUID PLEURAL  Final   Special Requests BOTTLES DRAWN AEROBIC AND ANAEROBIC 10CC  Final   Culture   Final    NO GROWTH 5 DAYS Performed at [facility]    Report Status 04/10/2015 FINAL  Final  Gram stain     Status: None   Collection Time: 04/05/15  3:28 PM  Result Value Ref Range Status   Specimen Description FLUID PLEURAL  Final   Special Requests NONE  Final   Gram Stain   Final    CYTOSPIN SMEAR WBC PRESENT,BOTH PMN AND MONONUCLEAR NO ORGANISMS SEEN Performed at [facility]    Report Status 04/05/2015 FINAL  Final     Scheduled Meds: . antiseptic oral rinse  7 mL Mouth Rinse BID  . atenolol  100 mg Oral Daily  . bisacodyl  10 mg Rectal TID  . fentaNYL  25 mcg Transdermal Q72H  . haloperidol lactate  5 mg Intravenous Q12H  . heparin lock flush  500 Units Intracatheter Q30 days  . insulin aspart  0-15 Units Subcutaneous 6  times per day  . LORazepam  2 mg Intravenous Q6H  . magic mouthwash  10 mL Oral TID  . metoCLOPramide (REGLAN)  injection  20 mg Intravenous 4 times per day  . mirtazapine  7.5 mg Oral BID  . pantoprazole (PROTONIX) IV  40 mg Intravenous Q12H  . temazepam  15 mg Oral QHS   Continuous Infusions: . Marland KitchenTPN (CLINIMIX-E) Adult 83 mL/hr at 04/10/15 1748   And  . fat emulsion 240 mL (04/10/15 1748)

## 2015-04-11 NOTE — Progress Notes (Signed)
Inpatient Diabetes Program Recommendations  AACE/ADA: New Consensus Statement on Inpatient Glycemic Control (2015)  Target Ranges:  Prepandial:   less than 140 mg/dL      Peak postprandial:   less than 180 mg/dL (1-2 hours)      Critically ill patients:  140 - 180 mg/dL    Results for REMI, LOPATA (MRN [id number]) as of 04/11/2015 09:40  Ref. Range 04/11/2015 00:51 04/11/2015 04:11 04/11/2015 06:22 04/11/2015 07:45  Glucose-Capillary Latest Ref Range: 65-99 mg/dL 225 (H) 195 (H) 230 (H) 245 (H)    Current Insulin Orders: Novolog Moderate SSI (0-15 units) Q6 hours (4 times per day)     MD/Pharmacy- Note patient currently receiving TPN @ 83cc/hour.  Novolog Moderate SSI only ordered Q6 hours (4 times per day).  Please change Novolog SSI coverage to Q4 hours.     --Will follow patient during hospitalization--  Wyn Quaker RN, MSN, CDE Diabetes Coordinator Inpatient Glycemic Control Team Team Pager: [phone] (8a-5p)

## 2015-04-11 NOTE — Progress Notes (Signed)
Daily Progress Note   Patient Name: Donald Fry       Date: 04/11/2015 DOB: Mar 18, 1956  Age: 59 y.o. MRN#: [id number] Attending Physician: Robbie Lis, MD Primary Care Physician: [name], MD Admit Date: 03/16/2015  Reason for Consultation/Follow-up: pain and non pain symptom management and Establishing goals of care   Life limiting illness:  Colon cancer.   Subjective:  The patient is agitated, resting in recliner. Has just received IV Haldol   wife present at the bedside Interval Events/discussion with family: Interval events noted. Dr Antonieta Pert note reviewed, input appreciated Discussed with wife at bedside, she states she has health issues of her own, has caregiver burden/burnout. discussed about self care with her. She is appreciate of the patient's brothers and sisters helping out Agree with IV Haldol for agitation, may also help with nausea.   Patient continues on nothing by mouth, TPN. Agree that will need to address TPN within the next 24-48 hours with wife.  On fentanyl IV PRN for pain, on Fentanyl patch    Length of Stay: 8 days  Current Medications: Scheduled Meds:  . antiseptic oral rinse  7 mL Mouth Rinse BID  . atenolol  100 mg Oral Daily  . bisacodyl  10 mg Rectal TID  . fentaNYL  25 mcg Transdermal Q72H  . haloperidol lactate  5 mg Intravenous Q12H  . heparin lock flush  500 Units Intracatheter Q30 days  . insulin aspart  0-15 Units Subcutaneous 6 times per day  . LORazepam  2 mg Intravenous Q6H  . magic mouthwash  10 mL Oral TID  . metoCLOPramide (REGLAN) injection  20 mg Intravenous 4 times per day  . mirtazapine  7.5 mg Oral BID  . pantoprazole (PROTONIX) IV  40 mg Intravenous Q12H  . temazepam  15 mg Oral QHS    Continuous Infusions: . Marland KitchenTPN  (CLINIMIX-E) Adult 83 mL/hr at 04/10/15 1748   And  . fat emulsion 240 mL (04/10/15 1748)  . Marland KitchenTPN (CLINIMIX-E) Adult     And  . fat emulsion      PRN Meds: diphenhydrAMINE, fentaNYL (SUBLIMAZE) injection, heparin lock flush **AND** heparin lock flush, LORazepam, ondansetron **OR** ondansetron (ZOFRAN) IV, phenol, sodium chloride  Physical Exam: Physical Exam  Weak confused  S1-S2 Diminished at bases Abdomen firm distended Extremities trace edema Vital Signs: BP 141/89 mmHg  Pulse 94  Temp(Src) 97.9 F (36.6 C) (Oral)  Resp 24  Ht 5\' 5"  (1.651 m)  Wt 91.4 kg (201 lb 8 oz)  BMI 33.53 kg/m2  SpO2 96% SpO2: SpO2: 96 % O2 Device: O2 Device: Nasal Cannula O2 Flow Rate: O2 Flow Rate (L/min): 3 L/min  Intake/output summary:   Intake/Output Summary (Last 24 hours) at 04/11/15 1123 Last data filed at 04/11/15 0900  Gross per 24 hour  Intake    108 ml  Output   1075 ml  Net   -967 ml   LBM:   Baseline Weight: Weight: 88.905 kg (196 lb) Most recent weight: Weight: 91.4 kg (201 lb 8 oz)       Palliative Assessment/Data: Flowsheet Rows        Most Recent Value   Intake Tab    Referral Department  Hospitalist   Unit at Time of Referral  Cardiac/Telemetry Unit   Palliative Care Primary Diagnosis  Cancer   Date Notified  04/08/15   Palliative Care Type  New Palliative care   Reason for referral  Pain   Date of Admission  03/06/2015   # of days IP prior to Palliative referral  5   Clinical Assessment    Psychosocial & Spiritual Assessment    Palliative Care Outcomes       Additional Data Reviewed: Recent Labs     04/10/15  0410  04/11/15  0430  WBC  9.7  13.4*  HGB  11.0*  10.8*  PLT  414*  404*  NA  140  139  BUN  24*  34*  CREATININE  1.54*  1.39*     Problem List:  Patient Active Problem List   Diagnosis Date Noted  . Failure to thrive in adult 04/10/2015  . Acute renal failure ([place]) 04/09/2015  . Anemia of chronic disease 04/09/2015  .  Metastatic colorectal cancer ([place]) 04/09/2015  . Protein-calorie malnutrition, severe ([place]) 04/09/2015  . Benign essential HTN 04/09/2015  . Fall   . 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 Encounter for palliative care   . Malignant ascites 04/07/2015  . Abdominal distension   . Pancreatitis, acute 04/05/2015  . Oral thrush 04/05/2015  . S/P thoracentesis   . Abdominal pain 04/04/2015  . Colon cancer metastasized to multiple sites ([place]) 08/20/2014  . Hypokalemia 07/28/2014     Palliative Care Assessment & Plan    1.Code Status:  DNR    Code Status Orders        Start     Ordered   04/08/15 0658  Do not attempt resuscitation (DNR)   Continuous    Question Answer Comment  In the event of cardiac or respiratory ARREST Do not call a "code blue"   In the event of cardiac or respiratory ARREST Do not perform Intubation, CPR, defibrillation or ACLS   In the event of cardiac or respiratory ARREST Use medication by any route, position, wound care, and other measures to relive pain and suffering. May use oxygen, suction and manual treatment of airway obstruction as needed for comfort.      04/08/15 0658       2. Goals of Care:   As outlined above  Limitations on Scope of Treatment: Continue with aggressive symptom management, continue to support the family  Desire for further Chaplaincy support:no  Psycho-social Needs: Caregiving  Support/Resources  3.  Symptom Management:      1. As above  4. Palliative Prophylaxis:   Bowel Regimen  5. Prognosis: ?probably few weeks  6. Discharge Planning:  pending further discussions and hospital course   Care plan was discussed with  Patient's wife  at the bedside  Thank you for allowing the Palliative Medicine Team to assist in the care of this patient.   Time In: 1000 Time Out: 1025 Total Time 25 Prolonged Time Billed  no        [phone]  Loistine Chance, MD  04/11/2015, 11:23 AM  Please contact Palliative  Medicine Team phone at [phone] for questions and concerns.

## 2015-04-11 NOTE — Telephone Encounter (Signed)
Received VM from [facility] at [place] [facility] stating that based on pt's hospital note, pt's upcoming appt with them is going to be cancelled. Dr Marin Olp aware. dph

## 2015-04-12 DIAGNOSIS — R188 Other ascites: Secondary | ICD-10-CM | POA: Insufficient documentation

## 2015-04-12 DIAGNOSIS — C184 Malignant neoplasm of transverse colon: Secondary | ICD-10-CM | POA: Insufficient documentation

## 2015-04-12 LAB — GLUCOSE, CAPILLARY
GLUCOSE-CAPILLARY: 206 mg/dL — AB (ref 65–99)
GLUCOSE-CAPILLARY: 98 mg/dL (ref 65–99)
Glucose-Capillary: 248 mg/dL — ABNORMAL HIGH (ref 65–99)
Glucose-Capillary: 168 mg/dL — ABNORMAL HIGH (ref 65–99)

## 2015-04-12 LAB — BASIC METABOLIC PANEL
ANION GAP: 10 (ref 5–15)
BUN: 39 mg/dL — AB (ref 6–20)
CHLORIDE: 104 mmol/L (ref 101–111)
CO2: 27 mmol/L (ref 22–32)
Calcium: 8.6 mg/dL — ABNORMAL LOW (ref 8.9–10.3)
Creatinine, Ser: 1.44 mg/dL — ABNORMAL HIGH (ref 0.61–1.24)
GFR calc Af Amer: 59 mL/min — ABNORMAL LOW (ref >60)
GFR, EST NON AFRICAN AMERICAN: 51 mL/min — AB (ref >60)
Glucose, Bld: 193 mg/dL — ABNORMAL HIGH (ref 65–99)
POTASSIUM: 3.9 mmol/L (ref 3.5–5.1)
Sodium: 141 mmol/L (ref 135–145)

## 2015-04-12 LAB — PHOSPHORUS: Phosphorus: 4.6 mg/dL (ref 2.5–4.6)

## 2015-04-12 LAB — MAGNESIUM: MAGNESIUM: 2.5 mg/dL — AB (ref 1.7–2.4)

## 2015-04-12 MED ORDER — FUROSEMIDE 10 MG/ML IJ SOLN
40.0000 mg | Freq: Once | INTRAMUSCULAR | Status: AC
  Administered 2015-04-12: 40 mg via INTRAVENOUS
  Filled 2015-04-12: qty 4

## 2015-04-12 MED ORDER — IPRATROPIUM-ALBUTEROL 0.5-2.5 (3) MG/3ML IN SOLN: 3.0000 mL | RESPIRATORY_TRACT | Status: DC | PRN

## 2015-04-12 MED ORDER — TRACE MINERALS CR-CU-MN-SE-ZN 10-1000-500-60 MCG/ML IV SOLN
INTRAVENOUS | Status: DC
  Filled 2015-04-12: qty 1992

## 2015-04-12 MED ORDER — FAT EMULSION 20 % IV EMUL
240.0000 mL | INTRAVENOUS | Status: DC
  Filled 2015-04-12: qty 250

## 2015-04-12 MED ORDER — IPRATROPIUM-ALBUTEROL 0.5-2.5 (3) MG/3ML IN SOLN
3.0000 mL | RESPIRATORY_TRACT | Status: DC
  Administered 2015-04-12 (×3): 3 mL via RESPIRATORY_TRACT
  Filled 2015-04-12 (×3): qty 3

## 2015-04-12 MED ORDER — MORPHINE SULFATE (PF) 2 MG/ML IV SOLN
2.0000 mg | INTRAVENOUS | Status: DC | PRN
  Administered 2015-04-12 (×3): 2 mg via INTRAVENOUS
  Filled 2015-04-12 (×4): qty 1

## 2015-04-12 MED ORDER — FENTANYL 50 MCG/HR TD PT72: 50.0000 ug | MEDICATED_PATCH | TRANSDERMAL | Status: DC

## 2015-04-12 MED ORDER — ALBUTEROL SULFATE (2.5 MG/3ML) 0.083% IN NEBU
2.5000 mg | INHALATION_SOLUTION | RESPIRATORY_TRACT | Status: AC
  Administered 2015-04-12: 2.5 mg via RESPIRATORY_TRACT
  Filled 2015-04-12: qty 3

## 2015-04-12 MED ORDER — FUROSEMIDE 10 MG/ML IJ SOLN
20.0000 mg | Freq: Once | INTRAMUSCULAR | Status: AC
Start: 2015-04-12 — End: 2015-04-12
  Administered 2015-04-12: 20 mg via INTRAVENOUS
  Filled 2015-04-12: qty 2

## 2015-04-13 NOTE — Progress Notes (Addendum)
Rec'd call shortly after pt passed. Pt's sister was lying on pt and crying; brother Elta [place]) was bedside in chair, holding pt hand, also crying. Pt's wife, daughter and other family members were bedside consoling one another. Overwhelmed with grief, pt's sister Inez Catalina) had to be physically picked up and carried out by family. CH told by several family members that pt's sister history of anxiety. Pt told me [place] she had taken 2 xanax at 6 and 9. Pt was cleaned by staff and family returned to room. Pt's wife spent time alone w/husband of 21 years. Inez Catalina lay on pt, however her daughter, who had arrived, was helpful in helping her mother manage the grief. Chaplain offered compassionate grief support to family members.  Following appropriate words of condolences and prayer, [place] remained w/family until they left unit. Chaplain Marlise Eves Holder   05-04-2015 2300  Clinical Encounter Type  Visited With Family

## 2015-04-18 ENCOUNTER — Ambulatory Visit: Payer: Medicaid Other | Admitting: Family

## 2015-04-18 ENCOUNTER — Other Ambulatory Visit: Payer: Medicaid Other

## 2015-04-21 ENCOUNTER — Ambulatory Visit: Payer: Medicaid Other | Admitting: Gastroenterology

## 2015-05-05 NOTE — Progress Notes (Addendum)
Patient ID: Donald Fry, male   DOB: 26-Nov-1955, 59 y.o.   MRN: [id number] [facility] PROGRESS NOTE  Donald Fry ZCH:[id number] DOB: 08-08-1955 DOA: 04/04/2015 PCP: [name], MD  Brief narrative:    59 year old white male with known metastatic colon cancer, s/p resection in March, on systemic chemotherapy presented for evaluation of ongoing abdominal pain of several weeks in duration, has had recent upper endoscopy which was unremarkable. In [facility] ED, pt was noted to have ascites and has underwent paracentesis with 1.7 L fluid removed, based on fluid analysis malignant cells seen. Due to concern for developing PNA pt was started on vancomycin and zosyn. CT chest was also notable for moderate left pleural effusion for which he underwent thoracentesis, again fluid analysis with malignant cells present.   Barrier to discharge: for now, patient does not want chemotherapy. Continue to monitor clinically.   Assessment/Plan:    Principal Problem:  Abdominal pain with malignant ascites / Colon cancer metastasized to multiple sites [facility]) / abdominal distention / abnormal LFT's / small gastric ulcer /  Adynamic ileus / Encounter for palliative care - Secondary to metastatic colorectal ca with mets to multiple sites in addition to malignant ascites - CT abdomen on admission demonstrated questionable small gastric ulcer. Also seen was increase in ascites somewhat complex suggesting the possibility of peritonitis. Please note he was on broad spectrum abx (see below). All abx stopped. There was also possible mild ileus seen on CT scan. He has had NGT from 11/3 through 11/4. Pt asked to have NGT out. - Pt underwent paracentesis 10/31 with 1.7 L fluid removed, malignant cells seen on fluid analysis.  - MRI abdomen was done 04/05/2015 multiple loculated fluid collections, including within the perihepatic space and left abdomen suspicious for infected ascites, developing abscess or abscesses that  cannot be excluded, upper abdominal adenopathy which could be reactive or metastatic; adynamic ileus. - Dr. Marin Olp of oncology agrees on holding off the chemo tx at this time. - Pt may require NGT placement, he declines it at this time. We will monitor clinical course in hospital. He is not stable at this time for discharge unless family and pt ready to pursue comfort care.   Active Problems:   Fall [place] 11/5 night - CT head demonstrated no acute findings   Multifocal PNA, RML and RLL, LLL  - Vanco and zosyn stopped 11/03 - Steroids stopped per oncology     Oral thrush - Was on diflucan, stopped by oncology    Acute pancreatitis  - Had elevated lipase but per oncology, does not really look likely pancreatitis    Hypokalemia - Secondary to GI losses  - Being supplemented    Acute kidney injury - Secondary to GI losses, dehydration - Foley inserted 11/7 since residual urine in bladder seen on bladder scan 11/7. - Cr improved, 1.5 --> 1.39   Left pleural effusion, malignant  - S/P thoracentesis 11/01 ~ 300 cc fluid removed 11/01, fluid analysis demonstrated malignant cells    Anemia of chronic disease - Secondary to malignancy - Hemoglobin stable at 10.8   Protein calorie malnutrition, severe / Failure to thrive in adult  - In the setting of acute on chronic and progressive cancer - Continue TPN  DVT Prophylaxis  - SCD's bilaterally  Code Status: DNR/DNI  Family Communication:  plan of care discussed with the patient and his wife and his brother at the bedside  Disposition Plan: to be determined, clinically unstable. Still vomiting but  refuses N tube. PCT assisting with goals of care.  IV access:  Peripheral IV  Procedures and diagnostic studies:     Dg Abd 2 Views 04/07/2015  1. No evidence of bowel distention. No definitive evidence of a free intraperitoneal air. Small lucencies noted projected over the right lung base/upper most portion of the right abdomen  are most likely in the lung and related to adjacent atelectasis. Continued follow-up abdominal series suggested. 2.  Small left pleural effusion.   Ct Head Wo Contrast 04/09/2015  1. Mild atrophy without acute intracranial process. 2. Trace air-fluid level within the left sphenoid sinus, nonspecific though could be seen in the setting of acute sinusitis.  Dg Eye Foreign Body 04/05/2015 No evidence of metallic foreign body within the orbits.   Dg Chest 1 View 04/05/2015 No pneumothorax following left thoracentesis. Small bilateral effusions with bibasilar atelectasis.  US Thoracentesis Asp Pleural Space W/img Guide 04/05/2015 Successful ultrasound-guided diagnostic and therapeutic left sided thoracentesis yielding 320 cc of pleural fluid.   Mr Abdomen W Wo Contrast 04/05/2015 Mild motion degradation. 2. Given this factor, no evidence of hepatic metastasis. 3. Multiple loculated fluid collections, including within the perihepatic space and left abdomen. These are suspicious for infected ascites. Developing abscess or abscesses cannot be excluded. 4. Bilateral pleural effusions with adjacent airspace disease. 5. Subtle pancreatic duct dilatation with an abrupt cut off in the region the pancreatic head. Although no dominant mass is seen in this area, there is hypo enhancement. Given limitations of the current exam, and comorbidities, consider follow-up with pancreatic protocol CT at 6-8 weeks (ideally after the patient is recovered from the acute episode) to exclude a non border deforming adenocarcinoma. 6. Apparent tiny filling defect in the portal vein could be artifactual, given absence of thrombus on the 04/04/2015 CT. Recommend attention on follow-up. 7. Mild prominence of small bowel loops, favoring adynamic ileus. 8. Upper abdominal adenopathy which could be reactive or metastatic.   US Paracentesis 04/04/2015 Successful ultrasound-guided diagnostic and therapeutic paracentesis yielding 1.7 liters of  peritoneal fluid.   Ct Chest W ContrastCt Angio Chest Pe W/cm &/or Wo Cm 03/28/2015 Similar areas of scattered airspace disease in the right middle and lower lobe. This could represent atelectasis or pneumonia or pneumonitis. Small but slightly increased right pleural effusion. 2. Moderate left pleural effusion representing a change from the prior study. New left lower lobe consolidation could represent pneumonia/pneumonitis or atelectasis 3. No evidence of pulmonary embolism 4. Again identified is a tiny focus of gas adjacent to the gastric antrum. As previously described, this may represent a small gastric ulcer. 5. Although there is no other evidence of pneumoperitoneum, there has been a significant increase in the volume of ascites in the abdomen and upper pelvis. The ascites appears somewhat complex as described above suggesting the possibility of peritonitis. 6. Multiple small nonenhancing liver lesions stable. 7. Anterior margin of the pancreas appears somewhat indistinct. This could reflect pancreatitis, but the finding is equivocal and may be related to the presence of fluid in the lesser sac. 8. Relative to decompressed colon, numerous fluid-filled loops of small bowel are mildly prominent but still within normal limits. This likely reflects mild ileus.   Ct Abdomen Pelvis W Contrast 04/01/2015  1. Similar areas of scattered airspace disease in the right middle and lower lobe. This could represent atelectasis or pneumonia or pneumonitis. Small but slightly increased right pleural effusion. 2. Moderate left pleural effusion representing a change from the prior study. New left lower  lobe consolidation could represent pneumonia/pneumonitis or atelectasis 3. No evidence of pulmonary embolism 4. Again identified is a tiny focus of gas adjacent to the gastric antrum. As previously described, this may represent a small gastric ulcer. 5. Although there is no other evidence of pneumoperitoneum, there has been  a significant increase in the volume of ascites in the abdomen and upper pelvis. The ascites appears somewhat complex as described above suggesting the possibility of peritonitis. 6. Multiple small nonenhancing liver lesions stable. 7. Anterior margin of the pancreas appears somewhat indistinct. This could reflect pancreatitis, but the finding is equivocal and may be related to the presence of fluid in the lesser sac. 8. Relative to decompressed colon, numerous fluid-filled loops of small bowel are mildly prominent but still within normal limits. This likely reflects mild ileus. Critical Value/emergent results were called by telephone at the time of interpretation on 03/17/2015 at 10:29 pm to Dr. Merrily Pew , who verbally acknowledged these results. Electronically Signed   By: Skipper Cliche M.D.   On: 03/14/2015 22:29   Medical Consultants:  IR Oncology Surgery  PCT  Other Consultants:  PT Nutrition  IAnti-Infectives:   Vanco and zosyn   Leisa Lenz, MD  [facility] Pager [phone]  Time spent in minutes: 25 minutes  If 7PM-7AM, please contact night-coverage [email or web address] Password [email or web address] 04-28-2015, 6:10 AM   LOS: 9 days    HPI/Subjective: No acute overnight events. Confusion intermittently.   Objective: Filed Vitals:   04/11/15 2027 04/11/15 2208 28-Apr-2015 0033 28-Apr-2015 0500  BP: 117/81   117/78  Pulse: 94   87  Temp: 100 F (37.8 C)   97.4 F (36.3 C)  TempSrc: Oral   Oral  Resp: 24   20  Height:      Weight:    95 kg (209 lb 7 oz)  SpO2: 93% 95% 95% 96%    Intake/Output Summary (Last 24 hours) at 04/28/15 0610 Last data filed at 2015/04/28 0440  Gross per 24 hour  Intake 3797.6 ml  Output   2075 ml  Net 1722.6 ml    Exam:   General:  Pt is not in distress  Cardiovascular: (+) S1, S2, rate controlled   Respiratory: bilateral air entry, no wheezing   Abdomen: (+) BS, non tender, distended   Extremities: No edema, appreciate bilateral pulses    Neuro: Non focal   Data Reviewed: Basic Metabolic Panel:  Recent Labs Lab 04/07/15 0735 04/08/15 0600 04/09/15 0535 04/10/15 0410 04/11/15 0430  NA 138 141 141 140 139  K 3.1* 3.4* 3.4* 3.7 3.9  CL 102 105 106 105 106  CO2 24 26 25 26 24   GLUCOSE 113* 170* 157* 189* 200*  BUN 10 16 24* 24* 34*  CREATININE 0.78 1.32* 1.52* 1.54* 1.39*  CALCIUM 8.5* 9.0 9.1 8.5* 8.5*  MG 2.1 2.3 2.6* 2.4 2.6*  PHOS 3.1 4.2 4.7* 3.9 3.9   Liver Function Tests:  Recent Labs Lab 04/06/15 0405 04/07/15 0735 04/08/15 0600 04/11/15 0430  AST 37 50* 41 67*  ALT 69* 69* 61 80*  ALKPHOS 283* 299* 285* 267*  BILITOT 1.2 1.0 0.4 0.9  PROT 6.1* 6.3* 6.4* 5.9*  ALBUMIN 2.4* 2.6* 2.6* 2.2*    Recent Labs Lab 04/06/15 0405 04/07/15 0735  LIPASE 52* 72*   No results for input(s): AMMONIA in the last 168 hours. CBC:  Recent Labs Lab 04/07/15 0335 04/08/15 0600 04/09/15 0535 04/10/15 0410 04/11/15 0430  WBC 7.7 8.5 7.9 9.7 13.4*  NEUTROABS  --  6.4  --   --  10.5*  HGB 11.5* 11.7* 11.6* 11.0* 10.8*  HCT 35.9* 37.1* 36.9* 35.4* 34.4*  MCV 101.4* 102.2* 101.9* 103.5* 102.1*  PLT 434* 403* 424* 414* 404*   Cardiac Enzymes: No results for input(s): CKTOTAL, CKMB, CKMBINDEX, TROPONINI in the last 168 hours. BNP: Invalid input(s): POCBNP CBG:  Recent Labs Lab 04/11/15 1135 04/11/15 1620 04/11/15 2022 04/11/15 2348 04/17/2015 0502  GLUCAP 258* 217* 198* 208* 206*    Recent Results (from the past 240 hour(s))  C difficile quick scan w PCR reflex     Status: None   Collection Time: 04/04/15 10:26 AM  Result Value Ref Range Status   C Diff antigen NEGATIVE NEGATIVE Final   C Diff toxin NEGATIVE NEGATIVE Final   C Diff interpretation Negative for toxigenic C. difficile  Final  Culture, body fluid-bottle     Status: None   Collection Time: 04/04/15 11:13 AM  Result Value Ref Range Status   Specimen Description FLUID ASCITIC  Final   Special Requests NONE  Final   Culture    Final    NO GROWTH 5 DAYS Performed at [facility]    Report Status 04/09/2015 FINAL  Final  Gram stain     Status: None   Collection Time: 04/04/15 11:13 AM  Result Value Ref Range Status   Specimen Description FLUID ASCITIC  Final   Special Requests NONE  Final   Gram Stain   Final    FEW WBC PRESENT, PREDOMINANTLY MONONUCLEAR NO ORGANISMS SEEN Performed at [facility]    Report Status 04/04/2015 FINAL  Final  Culture, body fluid-bottle     Status: None   Collection Time: 04/05/15  3:28 PM  Result Value Ref Range Status   Specimen Description FLUID PLEURAL  Final   Special Requests BOTTLES DRAWN AEROBIC AND ANAEROBIC 10CC  Final   Culture   Final    NO GROWTH 5 DAYS Performed at [facility]    Report Status 04/10/2015 FINAL  Final  Gram stain     Status: None   Collection Time: 04/05/15  3:28 PM  Result Value Ref Range Status   Specimen Description FLUID PLEURAL  Final   Special Requests NONE  Final   Gram Stain   Final    CYTOSPIN SMEAR WBC PRESENT,BOTH PMN AND MONONUCLEAR NO ORGANISMS SEEN Performed at [facility]    Report Status 04/05/2015 FINAL  Final     Scheduled Meds: . antiseptic oral rinse  7 mL Mouth Rinse BID  . atenolol  100 mg Oral Daily  . bisacodyl  10 mg Rectal TID  . fentaNYL  25 mcg Transdermal Q72H  . haloperidol lactate  5 mg Intravenous Q12H  . heparin lock flush  500 Units Intracatheter Q30 days  . insulin aspart  0-15 Units Subcutaneous 6 times per day  . LORazepam  4 mg Intravenous Q4H  . magic mouthwash  10 mL Oral TID  . metoCLOPramide (REGLAN) injection  20 mg Intravenous 4 times per day  . mirtazapine  7.5 mg Oral BID  . pantoprazole (PROTONIX) IV  40 mg Intravenous Q12H  . temazepam  15 mg Oral QHS   Continuous Infusions: . Marland KitchenTPN (CLINIMIX-E) Adult 83 mL/hr at 04/11/15 1711   And  . fat emulsion 240 mL (04/17/2015 0050)

## 2015-05-05 NOTE — Discharge Summary (Signed)
Death Summary  Donald Fry [id number] DOB: February 08, 1956 DOA: Apr 20, 2015  PCP: [name], MD PCP/Office notified  Admit date: 20-Apr-2015 Date of Death: 04/29/2015 at 20:24 pm  - please note i was not present at the time of pt expiration.   Final Diagnoses:  Principal Problem:   Abdominal pain Active Problems:   Colon cancer metastasized to multiple sites [facility])   Abdominal distension   Malignant ascites   Metastatic colorectal cancer ([place])   Hypokalemia   Pancreatitis, acute   Oral thrush   S/P thoracentesis   Encounter for palliative care   Malignant pleural effusion   Acute renal failure (HCC)   Anemia of chronic disease   Fall   Protein-calorie malnutrition, severe (HCC)   Benign essential HTN   Failure to thrive in adult   Adjustment disorder with mixed anxiety and depressed mood   Ascites   Cancer of transverse colon (HCC)   History of present illness:  59 year old white male with known metastatic colon cancer, s/p resection in March, on systemic chemotherapy presented for evaluation of ongoing abdominal pain of several weeks in duration, has had recent upper endoscopy which was unremarkable. In [facility] ED, pt was noted to have ascites and has underwent paracentesis with 1.7 L fluid removed, based on fluid analysis malignant cells seen. Due to concern for developing PNA pt was started on vancomycin and zosyn. CT chest was also notable for moderate left pleural effusion for which he underwent thoracentesis, again fluid analysis with malignant cells present.   Hospital Course:   Assessment/Plan:    Principal Problem:  Abdominal pain with malignant ascites / Colon cancer metastasized to multiple sites [facility]) / abdominal distention / abnormal LFT's / small gastric ulcer / Adynamic ileus / Encounter for palliative care - Secondary to metastatic colorectal ca with mets to multiple sites in addition to malignant ascites - CT abdomen on admission demonstrated  questionable small gastric ulcer. Also seen was increase in ascites somewhat complex suggesting the possibility of peritonitis. Please note he was on broad spectrum abx (see below). All abx stopped. There was also possible mild ileus seen on CT scan. He has had NGT from 11/3 through 11/4. Pt asked to have NGT out. - Pt underwent paracentesis 10/31 with 1.7 L fluid removed, malignant cells seen on fluid analysis.  - MRI abdomen was done 04/05/2015 multiple loculated fluid collections, including within the perihepatic space and left abdomen suspicious for infected ascites, developing abscess or abscesses that cannot be excluded, upper abdominal adenopathy which could be reactive or metastatic; adynamic ileus. - Dr. Marin Olp of oncology recommended palliative care, comfort care   Active Problems:  Fall [place] 11/5 night - CT head demonstrated no acute findings   Multifocal PNA, RML and RLL, LLL  - Vanco and zosyn stopped 11/03 - Steroids stopped per oncology    Oral thrush - Was on diflucan, stopped by oncology    Acute pancreatitis  - Had elevated lipase but per oncology, does not really look likely pancreatitis    Hypokalemia - Secondary to GI losses  - Supplemented    Acute kidney injury - Secondary to GI losses, dehydration - Foley inserted 11/7 since residual urine in bladder seen on bladder scan 11/7. - Cr improved, 1.5 --> 1.39   Left pleural effusion, malignant  - S/P thoracentesis 11/01 ~ 300 cc fluid removed 11/01, fluid analysis demonstrated malignant cells   Anemia of chronic disease - Secondary to malignancy - Hemoglobin stable    Protein  calorie malnutrition, severe / Failure to thrive in adult  - In the setting of acute on chronic and progressive cancer - TPN stopped due to fluid overload   DVT Prophylaxis  - SCD's bilaterally  Code Status: DNR/DNI    IV access:  Peripheral IV  Procedures and diagnostic studies:    Dg Abd 2 Views  04/07/2015 1. No evidence of bowel distention. No definitive evidence of a free intraperitoneal air. Small lucencies noted projected over the right lung base/upper most portion of the right abdomen are most likely in the lung and related to adjacent atelectasis. Continued follow-up abdominal series suggested. 2. Small left pleural effusion.   Ct Head Wo Contrast 04/09/2015 1. Mild atrophy without acute intracranial process. 2. Trace air-fluid level within the left sphenoid sinus, nonspecific though could be seen in the setting of acute sinusitis.  Dg Eye Foreign Body 04/05/2015 No evidence of metallic foreign body within the orbits.   Dg Chest 1 View 04/05/2015 No pneumothorax following left thoracentesis. Small bilateral effusions with bibasilar atelectasis.  US Thoracentesis Asp Pleural Space W/img Guide 04/05/2015 Successful ultrasound-guided diagnostic and therapeutic left sided thoracentesis yielding 320 cc of pleural fluid.   Mr Abdomen W Wo Contrast 04/05/2015 Mild motion degradation. 2. Given this factor, no evidence of hepatic metastasis. 3. Multiple loculated fluid collections, including within the perihepatic space and left abdomen. These are suspicious for infected ascites. Developing abscess or abscesses cannot be excluded. 4. Bilateral pleural effusions with adjacent airspace disease. 5. Subtle pancreatic duct dilatation with an abrupt cut off in the region the pancreatic head. Although no dominant mass is seen in this area, there is hypo enhancement. Given limitations of the current exam, and comorbidities, consider follow-up with pancreatic protocol CT at 6-8 weeks (ideally after the patient is recovered from the acute episode) to exclude a non border deforming adenocarcinoma. 6. Apparent tiny filling defect in the portal vein could be artifactual, given absence of thrombus on the 03/08/2015 CT. Recommend attention on follow-up. 7. Mild prominence of small bowel loops, favoring adynamic  ileus. 8. Upper abdominal adenopathy which could be reactive or metastatic.   US Paracentesis 04/04/2015 Successful ultrasound-guided diagnostic and therapeutic paracentesis yielding 1.7 liters of peritoneal fluid.   Ct Chest W ContrastCt Angio Chest Pe W/cm &/or Wo Cm 03/24/2015 Similar areas of scattered airspace disease in the right middle and lower lobe. This could represent atelectasis or pneumonia or pneumonitis. Small but slightly increased right pleural effusion. 2. Moderate left pleural effusion representing a change from the prior study. New left lower lobe consolidation could represent pneumonia/pneumonitis or atelectasis 3. No evidence of pulmonary embolism 4. Again identified is a tiny focus of gas adjacent to the gastric antrum. As previously described, this may represent a small gastric ulcer. 5. Although there is no other evidence of pneumoperitoneum, there has been a significant increase in the volume of ascites in the abdomen and upper pelvis. The ascites appears somewhat complex as described above suggesting the possibility of peritonitis. 6. Multiple small nonenhancing liver lesions stable. 7. Anterior margin of the pancreas appears somewhat indistinct. This could reflect pancreatitis, but the finding is equivocal and may be related to the presence of fluid in the lesser sac. 8. Relative to decompressed colon, numerous fluid-filled loops of small bowel are mildly prominent but still within normal limits. This likely reflects mild ileus.   Ct Abdomen Pelvis W Contrast 03/15/2015 1. Similar areas of scattered airspace disease in the right middle and lower lobe. This could  represent atelectasis or pneumonia or pneumonitis. Small but slightly increased right pleural effusion. 2. Moderate left pleural effusion representing a change from the prior study. New left lower lobe consolidation could represent pneumonia/pneumonitis or atelectasis 3. No evidence of pulmonary embolism 4. Again  identified is a tiny focus of gas adjacent to the gastric antrum. As previously described, this may represent a small gastric ulcer. 5. Although there is no other evidence of pneumoperitoneum, there has been a significant increase in the volume of ascites in the abdomen and upper pelvis. The ascites appears somewhat complex as described above suggesting the possibility of peritonitis. 6. Multiple small nonenhancing liver lesions stable. 7. Anterior margin of the pancreas appears somewhat indistinct. This could reflect pancreatitis, but the finding is equivocal and may be related to the presence of fluid in the lesser sac. 8. Relative to decompressed colon, numerous fluid-filled loops of small bowel are mildly prominent but still within normal limits. This likely reflects mild ileus. Critical Value/emergent results were called by telephone at the time of interpretation on 04/02/2015 at 10:29 pm to Dr. Merrily Pew , who verbally acknowledged these results. Electronically Signed By: Skipper Cliche M.D. On: 03/24/2015 22:29   Medical Consultants:  IR Oncology Surgery  PCT  Other Consultants:  PT Nutrition  IAnti-Infectives:   Vanco and zosyn    Signed:  Akshith Moncus  [facility] 04/28/2015, 6:28 PM

## 2015-05-05 NOTE — Progress Notes (Signed)
Nutrition Brief Note  Chart reviewed. Pt now transitioning to comfort care.  No further nutrition interventions warranted at this time.  Please re-consult as needed.   TPN has been ordered to be stopped as of this AM.   Jarome Matin, RD, LDN Inpatient Clinical Dietitian Pager # [phone] After hours/weekend pager # [phone]

## 2015-05-05 NOTE — Care Management Note (Signed)
Case Management Note  Patient Details  Name: Donald Fry MRN: [id number] Date of Birth: [birth year]  Subjective/Objective:  Noted palliative med team note-actively dying,comfort care,anticipate hospital death.                  Action/Plan:   Expected Discharge Date:                  Expected Discharge Plan:  Home/Self Care  In-House Referral:     Discharge planning Services  CM Consult  Post Acute Care Choice:    Choice offered to:     DME Arranged:    DME Agency:     HH Arranged:    HH Agency:     Status of Service:  In process, will continue to follow  Medicare Important Message Given:    Date Medicare IM Given:    Medicare IM give by:    Date Additional Medicare IM Given:    Additional Medicare Important Message give by:     If discussed at [place] of Stay Meetings, dates discussed:    Additional Comments:  Dessa Phi, RN Apr 21, 2015, 10:28 AM

## 2015-05-05 NOTE — Progress Notes (Signed)
PT Cancellation Note  Patient Details Name: Donald Fry MRN: [id number] DOB: 04-25-1956   Cancelled Treatment:    Reason Eval/Treat Not Completed: Medical issues which prohibited therapy. Per palliative care note, pt does not appear appropriate for PT at this time. Will sign off. Please reorder if needs/status changes.   Weston Anna, MPT Pager: [phone]

## 2015-05-05 NOTE — Progress Notes (Signed)
PARENTERAL NUTRITION CONSULT NOTE -   Pharmacy Consult for TPN Indication: Prolonged Ileus  No Known Allergies  Patient Measurements: Height: 5' 5"  (165.1 cm) Weight: 209 lb 7 oz (95 kg) IBW/kg (Calculated) : 61.5  Vital Signs: Temp: 97.4 F (36.3 C) (11/08 0500) Temp Source: Oral (11/08 0500) BP: 117/78 mmHg (11/08 0500) Pulse Rate: 87 (11/08 0500) Intake/Output from previous day: 11/07 0701 - 11/08 0700 In: 1131.2 [IV Piggyback:108; TPN:1023.2] Out: 1750 [Urine:1750] Intake/Output from this shift:    Labs:  Recent Labs  04/10/15 0410 04/11/15 0430  WBC 9.7 13.4*  HGB 11.0* 10.8*  HCT 35.4* 34.4*  PLT 414* 404*     Recent Labs  04/10/15 0410 04/11/15 0430 04-21-15 0501  NA 140 139 141  K 3.7 3.9 3.9  CL 105 106 104  CO2 26 24 27   GLUCOSE 189* 200* 193*  BUN 24* 34* 39*  CREATININE 1.54* 1.39* 1.44*  CALCIUM 8.5* 8.5* 8.6*  MG 2.4 2.6* 2.5*  PHOS 3.9 3.9 4.6  PROT  --  5.9*  --   ALBUMIN  --  2.2*  --   AST  --  67*  --   ALT  --  80*  --   ALKPHOS  --  267*  --   BILITOT  --  0.9  --   PREALBUMIN  --  6.1*  --   TRIG  --  171*  --    Estimated Creatinine Clearance: 57.1 mL/min (by C-G formula based on Cr of 1.44).    Recent Labs  04/11/15 2022 04/11/15 2348 04-21-2015 0502  GLUCAP 198* 208* 206*    Medical History: History reviewed. No pertinent past medical history.  Medications:  Infusions:  . Marland KitchenTPN (CLINIMIX-E) Adult 83 mL/hr at 04/11/15 1711   And  . fat emulsion 240 mL (2015/04/21 0050)    Insulin Requirements in the past 24 hours: 29 units Novolog (moderate SSI) + 10 units of regular insulin in TPN  Current Nutrition: NPO  IVF: none  Central access: Implanted Port (08/16/14) TPN start date: 11/3  ASSESSMENT                                                                                                          HPI: 50 yoM [facility] ED on [facility]/30 and was admitted to [facility] on 10/31 with 3 weeks of progressive worsening  abdominal pain and "bloating". PMH significant for stage 4 colon cancer with extensive peritoneal disease s/p resection and chemo (last in September) with a plan to transition to oral maintenance chemotherapy, and remote hx of bile leak following cholecystectomy. No indication for surgery at this time. RD notes poor oral intake for ~3 weeks and has been unable to tolerate PO since admission.  Pharmacy is consulted to start TPN.  He will be high risk for refeeding with very limited PO intake over the last several weeks.  Significant events:  11/4: had NGT from 11/3 through 11/4. Pt asked to have NGT out. 11/5: 0500-patient fell, TPN was disconnected, running d10w till new  bag arrived at 1800  Today:   Glucose - elevated and worsened after increasing TPN to goal rate. Added insulin to TPN yesterday and adjusted SSI but still not controlled. Will reduce dextrose content in TPN (switch Clinimix formulation from 5/20 to 5/15).  Electrolytes - K+/Phos WNL, Mag 2.6, CoCa 9.94.   Renal - SCr, BUN trending up, UOP 0.8 mL/kg/hr  LFTs - Alk phos, Lipase, AST, ALT all elevated. Tbili WNL.  TGs - 210 (11/4), 171 (11/7)  Prealbumin - 9.6 (11/4), 6.1 (11/7)  Pt continues to vomit, does not want NG tube  NUTRITIONAL GOALS                                                                                             RD recs (11/4): 110-125 g protein/day, 2300-2525 Kcal/day  Clinimix 5/15 at a goal rate of 100 ml/hr + 20% fat emulsion at 10 ml/hr to provide: 120 g/day protein, 2184 Kcal/day.  PLAN                                                                                                                          At 1800 today:  Change Clinimix formulation to E 5/15 at 83 ml/hr.  Will get CBGs under control and plan to advance to new goal tomorrow if able.  Add 20 units of regular insulin to TPN.  20% fat emulsion at 10 ml/hr.  TPN to contain standard multivitamins and trace  elements.  Change SSI moderate scale to q4h  TPN lab panels on Mondays & Thursdays.  F/u daily. BMET, Mag, Phos in AM.  Hershal Coria, PharmD, BCPS Pager: [phone] 05-04-15 7:30 AM

## 2015-05-05 NOTE — Progress Notes (Signed)
Mr. Ines certainly is not improving. He is still having some vomiting. His abdomen is quite distended.  I really think that a NG tube would help. I really as this might not be all that conducive for his quality of life but I think temporarily the NG tube might help with getting rid of the abdominal secretions that he just cannot get rid of.  He does have a Actuary. Apparently the sitter said that he had a couple bowel movements last night.  When I listened to his abdomen this morning, his bowel sounds were absent. Again he is quite distended.  His creatinine is going up a little bit. I suppose this is no surprise.  He does have wheezing. He said probably has reflux into his lungs area did he had a chest x-ray done yesterday. It showed that he had moderate pleural effusion on the left and a small 1 on the right. This probably is reflective of his underlying malignancy. He has some atelectasis. There is no obvious pneumonia.  He is on Haldol and this is helping with his agitation.  I spoke to his wife. I told her that there is just no indication that chemotherapy at this point is going to help him. I think chemotherapy would only cause more difficulties and this would not be favorable for him.  She understands this.  I think given his rate of decline, I'll be surprised if he made it more than 1-2 weeks.  I'm not sure if the TNA really is helping at this point. However, I don't think the family would be willing to stop it.  I thought about the possibility of a morphine drip for him. I think if he becomes more agitated, this might be a reasonable option.  Unfortunately, I think the best thing for right now is to try to get NG tube in him to try to drain his intestines. I suspect that he has quite a bit of secretions that he just cannot move through.  I'm not sure much she really understands when I tried talk with him this morning.  His decline continues and I would just be surprised if he made  any kind of significant recovery.  His wife has her own health issues. I told her to not wear herself out. He is getting outstanding care by all the staff up on 4 E. I know she wants to be with him all the time but she really is not physically able to do this.  I spent a good 30 minutes with him this morning.  Pete E.  2 timothy 2:22

## 2015-05-05 NOTE — Progress Notes (Signed)
Daily Progress Note   Patient Name: Donald Fry       Date: 24-Apr-2015 DOB: May 12, 1956  Age: 59 y.o. MRN#: [id number] Attending Physician: Robbie Lis, MD Primary Care Physician: [name], MD Admit Date: 03/16/2015  Reason for Consultation/Follow-up: pain and non pain symptom management and Establishing goals of care   Life limiting illness:  Colon cancer.   Subjective:  The patient is agitated, now essentially less responsive, does not open eyes, does not follow commands. Ongoing vomiting. Wife at bedside   Interval Events/discussion with family: Interval events noted. Dr Antonieta Pert note reviewed, input appreciated, agree with his assessment fully.   Discussed with wife at bedside that the patient is now beginning to display signs and symptoms of having entered the dying process, with mottling coolness bilateral LE, less responsiveness, more agitation.  Discussed with her our concern that the patient might not survive this hospitalization. She is significantly stressed out, she does not ask much questions. Offered supportive care, active listening.   Discussed with patient about the patient's TPN. The patient is not progressing, in fact, now with possible terminal agitation, entering into the dying process, possible prognosis hours to days. Discussed that at this point in time, there is no role for artificial nutrition such as TPN. Wife is agreeable to D/C TPN.   Requested bedside RN to provide the wife/ family with comfort care cart.   Continue to support the patient and his wife. I worry that she may have complicated grief after the patient passes away. Offered extensive support to her, encouraged her to share her thoughts and feelings with me, but she appears to be over  whelmed with the ongoing rapid decline the patient has faced through out the course of this hospitalization.     Length of Stay: 9 days  Current Medications: Scheduled Meds:  . antiseptic oral rinse  7 mL Mouth Rinse BID  . atenolol  100 mg Oral Daily  . bisacodyl  10 mg Rectal TID  . [START ON 04/14/2015] fentaNYL  50 mcg Transdermal Q72H  . haloperidol lactate  5 mg Intravenous Q12H  . heparin lock flush  500 Units Intracatheter Q30 days  . insulin aspart  0-15 Units Subcutaneous 6 times per day  . LORazepam  4 mg Intravenous Q4H  . magic mouthwash  10 mL Oral TID  . metoCLOPramide (REGLAN) injection  20 mg Intravenous 4 times per day  . mirtazapine  7.5 mg Oral BID  . pantoprazole (PROTONIX) IV  40 mg Intravenous Q12H  . temazepam  15 mg Oral QHS    Continuous Infusions:    PRN Meds: albuterol, diphenhydrAMINE, fentaNYL (SUBLIMAZE) injection, heparin lock flush **AND** heparin lock flush, LORazepam, ondansetron **OR** ondansetron (ZOFRAN) IV, phenol, sodium chloride  Physical Exam: Physical Exam             Weak confused agitated restless  S1-S2 Diminished at bases Abdomen firm distended Extremities coolness mottling bilateral LE Vital Signs: BP 117/78 mmHg  Pulse 87  Temp(Src) 97.4 F (36.3 C) (Oral)  Resp 20  Ht 5\' 5"  (1.651 m)  Wt 95 kg (209 lb 7 oz)  BMI 34.85 kg/m2  SpO2 96% SpO2: SpO2: 96 % O2 Device: O2 Device: Nasal Cannula O2 Flow Rate: O2 Flow Rate (L/min): 4 L/min  Intake/output summary:   Intake/Output Summary (Last 24 hours) at 04/22/2015 0836 Last data filed at 04-22-15 0440  Gross per 24 hour  Intake 1131.17 ml  Output   1750 ml  Net -618.83 ml   LBM:   Baseline Weight: Weight: 88.905 kg (196 lb) Most recent weight: Weight: 95 kg (209 lb 7 oz)       Palliative Assessment/Data: Flowsheet Rows        Most Recent Value   Intake Tab    Referral Department  Hospitalist   Unit at Time of Referral  Cardiac/Telemetry Unit   Palliative Care  Primary Diagnosis  Cancer   Date Notified  04/08/15   Palliative Care Type  New Palliative care   Reason for referral  Pain   Date of Admission  04/01/2015   # of days IP prior to Palliative referral  5   Clinical Assessment    Psychosocial & Spiritual Assessment    Palliative Care Outcomes      PPS 10% Additional Data Reviewed: Recent Labs     04/10/15  0410  04/11/15  0430  2015/04/22  0501  WBC  9.7  13.4*   --   HGB  11.0*  10.8*   --   PLT  414*  404*   --   NA  140  139  141  BUN  24*  34*  39*  CREATININE  1.54*  1.39*  1.44*     Problem List:  Patient Active Problem List   Diagnosis Date Noted  . Adjustment disorder with mixed anxiety and depressed mood   . Failure to thrive in adult 04/10/2015  . Acute renal failure ([place]) 04/09/2015  . Anemia of chronic disease 04/09/2015  . Metastatic colorectal cancer ([place]) 04/09/2015  . Protein-calorie malnutrition, severe ([place]) 04/09/2015  . Benign essential HTN 04/09/2015  . Fall   . 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 Encounter for palliative care   . Malignant ascites 04/07/2015  . Abdominal distension   . Pancreatitis, acute 04/05/2015  . Oral thrush 04/05/2015  . S/P thoracentesis   . Abdominal pain 04/04/2015  . Colon cancer metastasized to multiple sites ([place]) 08/20/2014  . Hypokalemia 07/28/2014     Palliative Care Assessment & Plan    1.Code Status:  DNR    Code Status Orders        Start     Ordered   04/08/15 0658  Do not attempt resuscitation (DNR)   Continuous    Question Answer Comment  In the event of cardiac or respiratory ARREST Do not call a "code blue"   In the event of cardiac or respiratory ARREST Do not perform Intubation, CPR, defibrillation or ACLS   In the event of cardiac or respiratory ARREST Use medication by any route, position, wound care, and other measures to relive pain and suffering. May use oxygen, suction and manual treatment of airway obstruction as needed for  comfort.      04/08/15 0658       2. Goals of Care:   DNR DNI comfort care, patient actively dying.   Limitations on Scope of Treatment: Continue with aggressive symptom management, continue to support the family  Desire for further Chaplaincy support:no, wife states she will contact her niece who is a Company secretary.   Psycho-social Needs: Caregiving  Support/Resources  3. Symptom Management:      1. Fentanyl patch, Fentanyl IV PRN, also on Scheduled Ativan and PRN Haldol and PRN Ativan.   4. Palliative Prophylaxis:   Bowel Regimen  5. Prognosis: hours to days  6. Discharge Planning:  Anticipated hospital death based on patient's ongoing decline. Discussed with wife.    Care plan was discussed with  Patient's wife  at the bedside  Thank you for allowing the Palliative Medicine Team to assist in the care of this patient.   Time In: 0800 Time Out: 0835 Total Time 35 Prolonged Time Billed  no        [phone]  Loistine Chance, MD  April 13, 2015, 8:36 AM  Please contact Palliative Medicine Team phone at [phone] for questions and concerns.

## 2015-05-05 NOTE — Progress Notes (Signed)
Change in clinical status Pt more confused, breathing more heavily at 9:12 am. Added duoneb scheduled and as needed Give dose of lasix 40 mg IV Give morphine to help with work of breathing Stop TPN  Leisa Lenz [facility] [phone]

## 2015-05-05 NOTE — Progress Notes (Signed)
Pt expired at 2024 hrs. Pt was a DNR on comfort care and death was expected. Death certificate completed and given to unit secretary. Condolences offered to family who are on the unit. Clance Boll, NP [facility]

## 2015-05-05 DEATH — deceased

## 2015-05-31 ENCOUNTER — Other Ambulatory Visit: Payer: Self-pay | Admitting: Nurse Practitioner

## 2016-09-16 IMAGING — DX DG CHEST 2V
2 series · 2 of 2 positions shown · non-contrast
Comparison: None.

CLINICAL DATA: Acute shortness of breath with right chest and rib
pain for 1 week. lifting injury.

EXAM:
CHEST  2 VIEW

[chest pa]
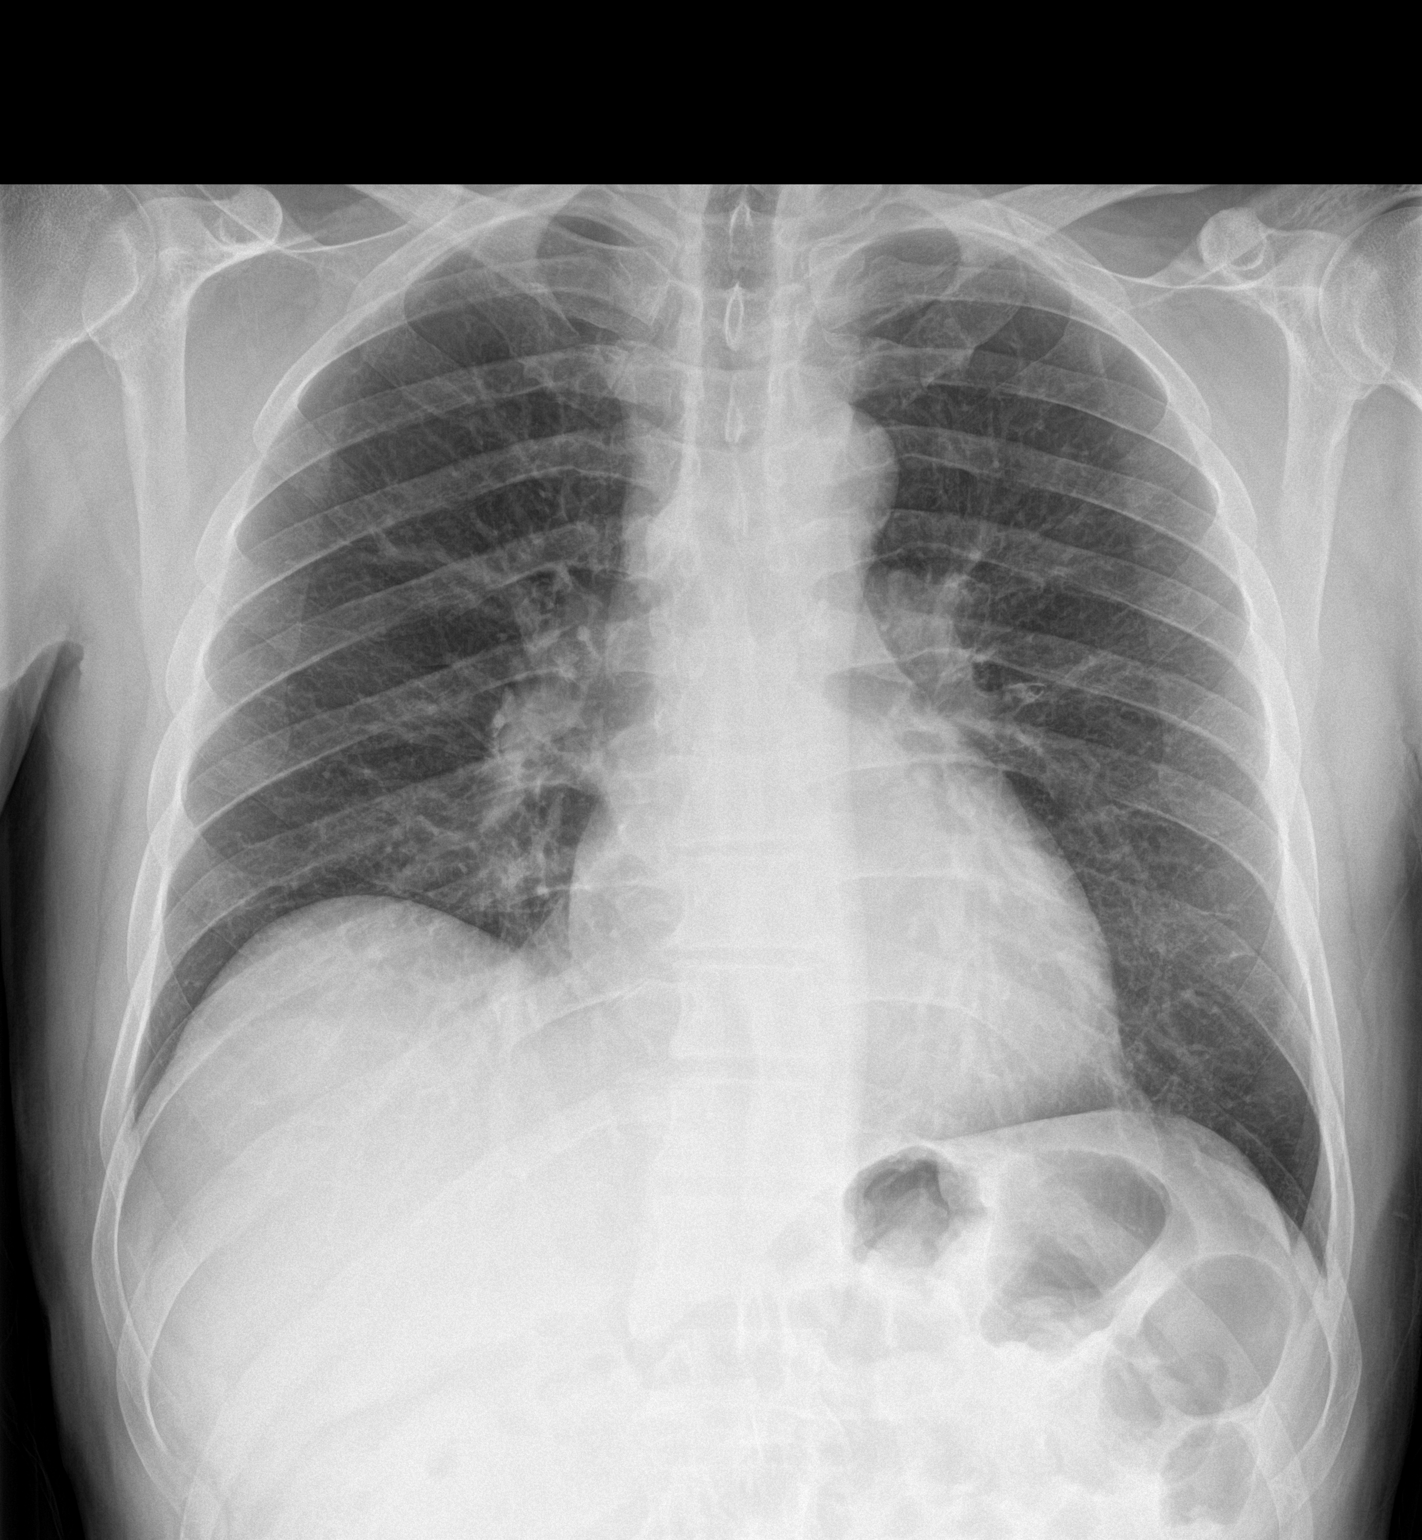

[chest lat]
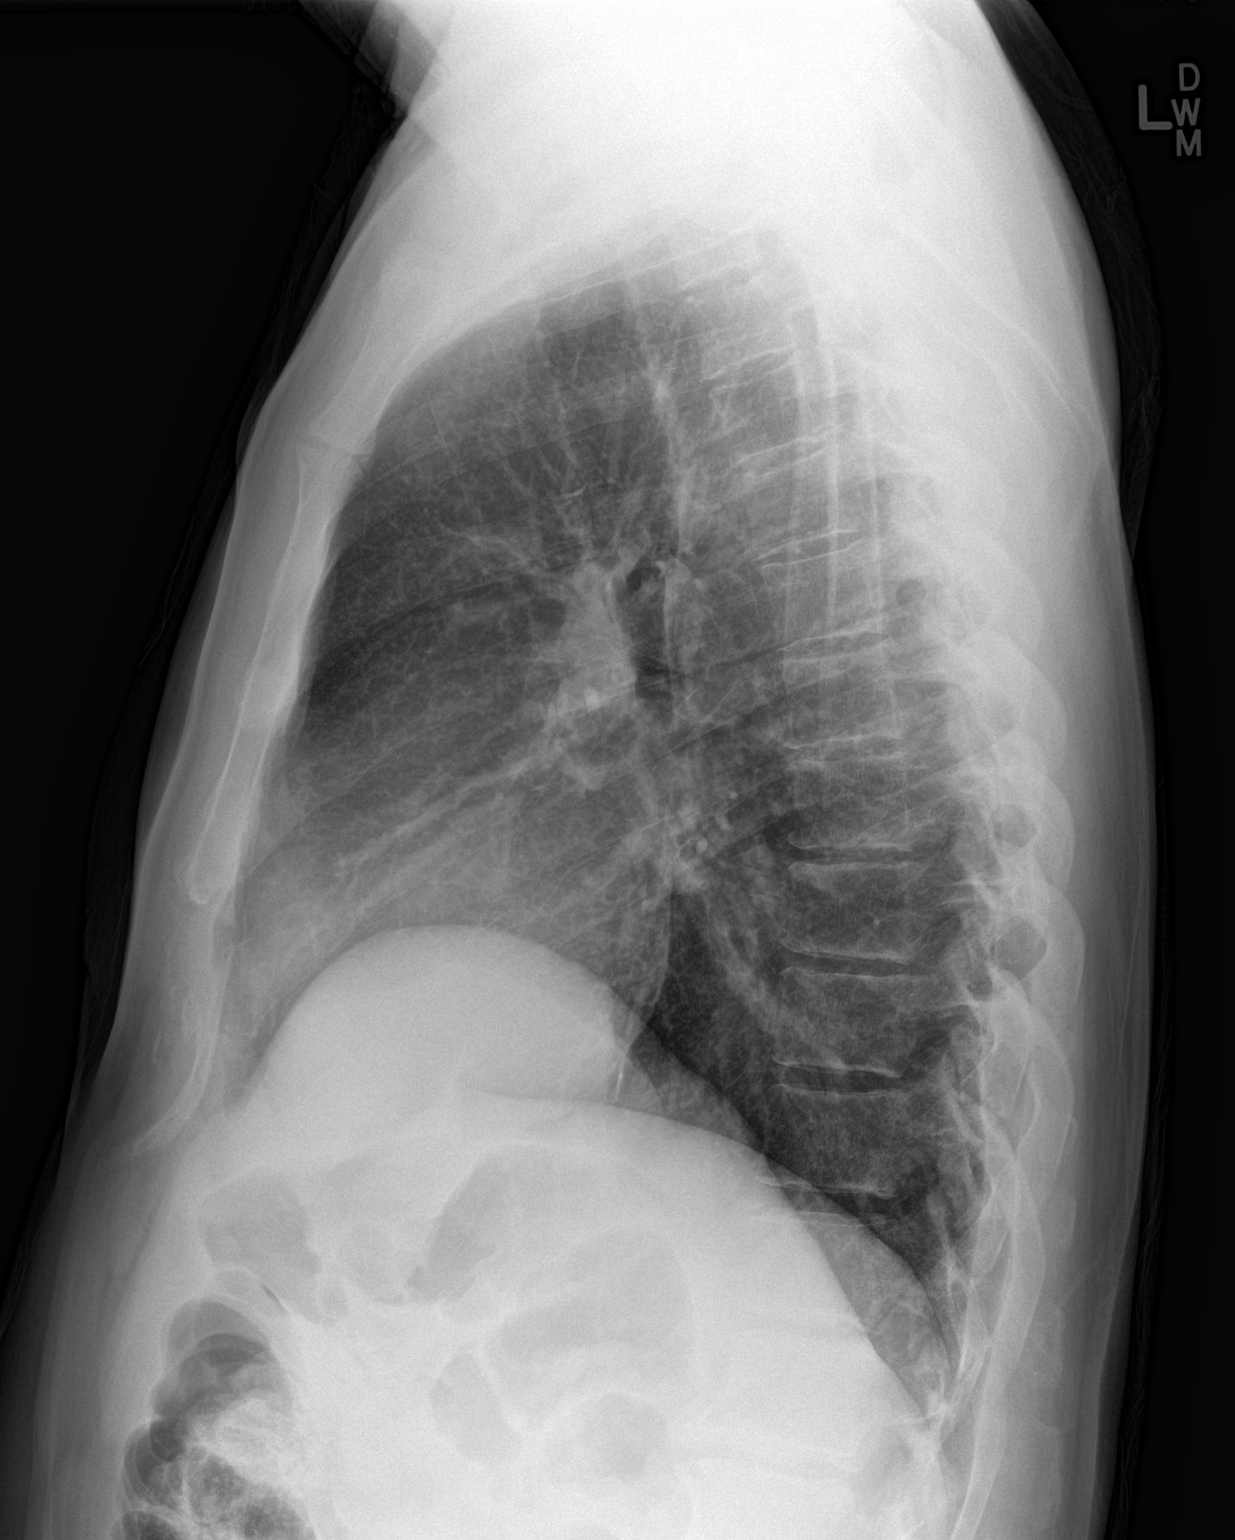

[2 of 2 positions shown; findings below may reference images not displayed]

FINDINGS: Anterior eventration of the right hemidiaphragm evident. Normal
heart size and vascularity. Minor central bronchitic change without
focal pneumonia, collapse or consolidation. No edema, effusion or
pneumothorax. Trachea midline. No acute osseous finding.
IMPRESSION: Mild central bronchitic change.

Anterior eventration of the right hemidiaphragm

No superimposed acute process.

## 2016-09-16 IMAGING — US US ABDOMEN COMPLETE
1 series · 13 of 25 positions shown · non-contrast
Comparison: None.

CLINICAL DATA: Acute right upper quadrant pain for 1 week.

EXAM:
ULTRASOUND ABDOMEN COMPLETE

[Series 1: us abdomen complete · 0.27mm/px · 13 of 114 slices shown]
[im 1/114]
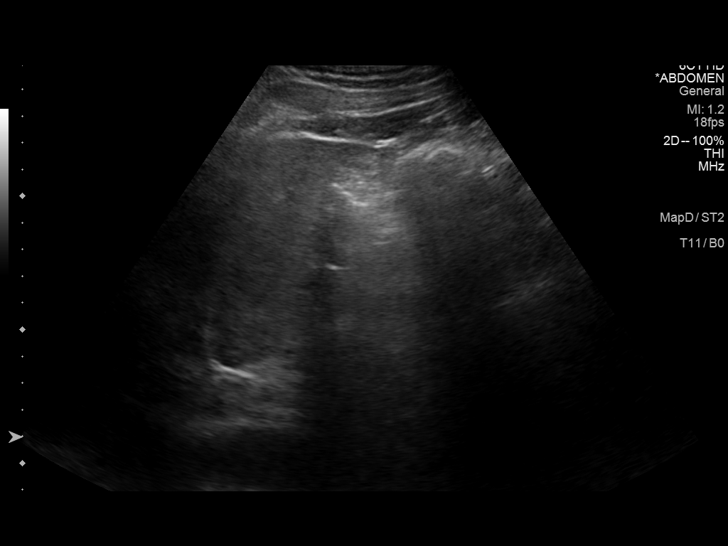
[im 10/114]
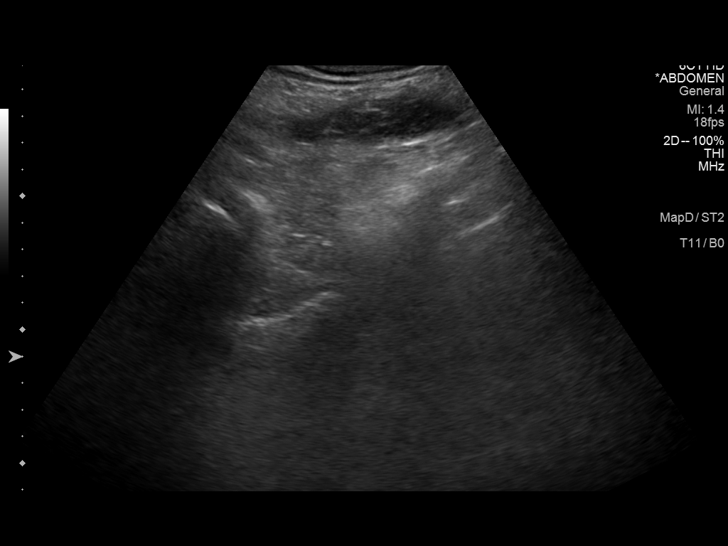
[im 19/114]
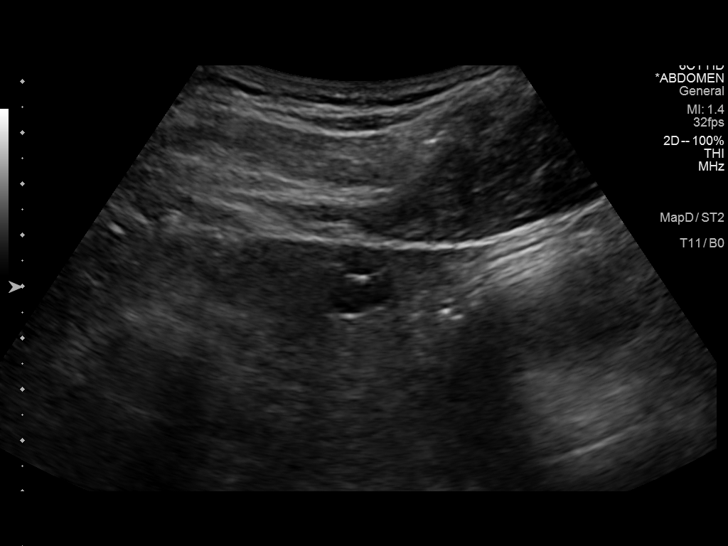
[im 29/114]
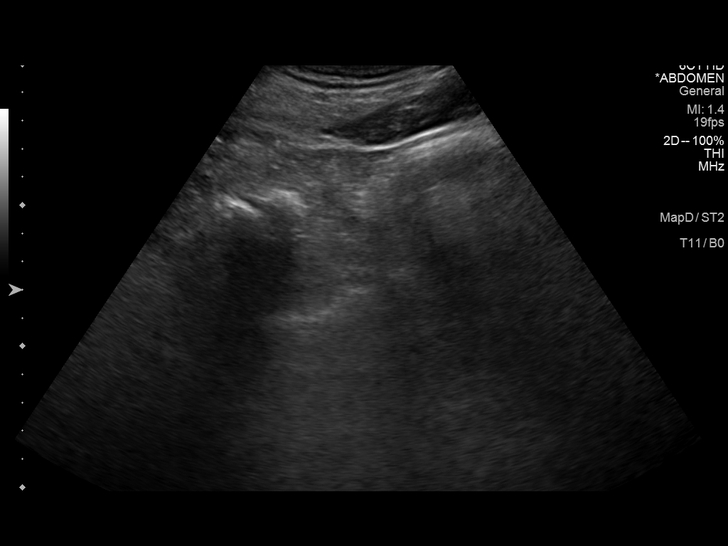
[im 38/114]
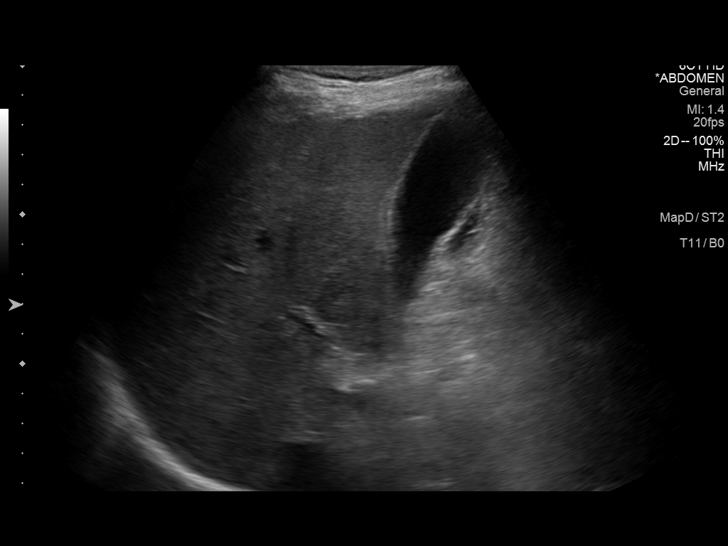
[im 48/114]
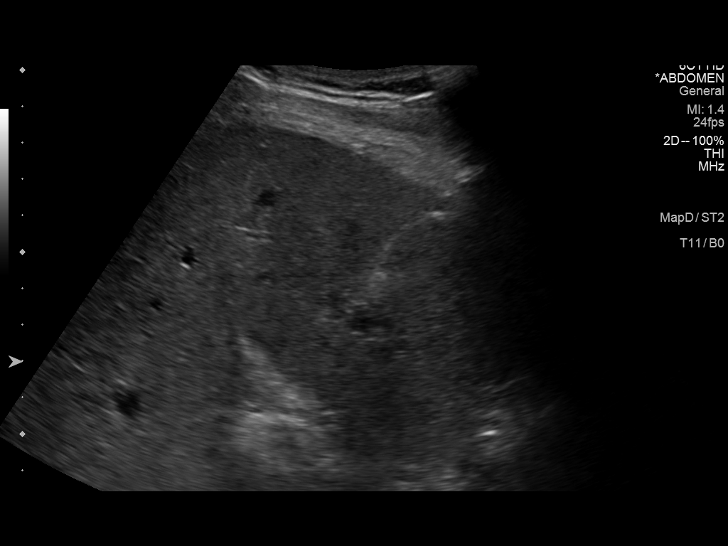
[im 57/114]
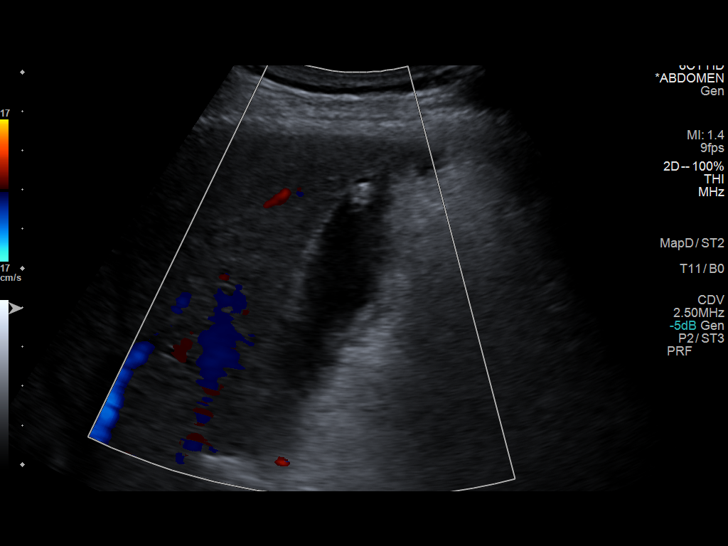
[im 66/114]
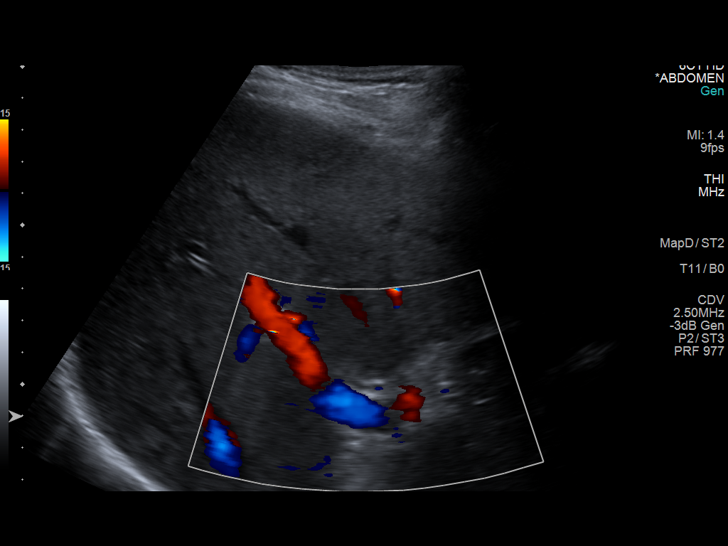
[im 76/114]
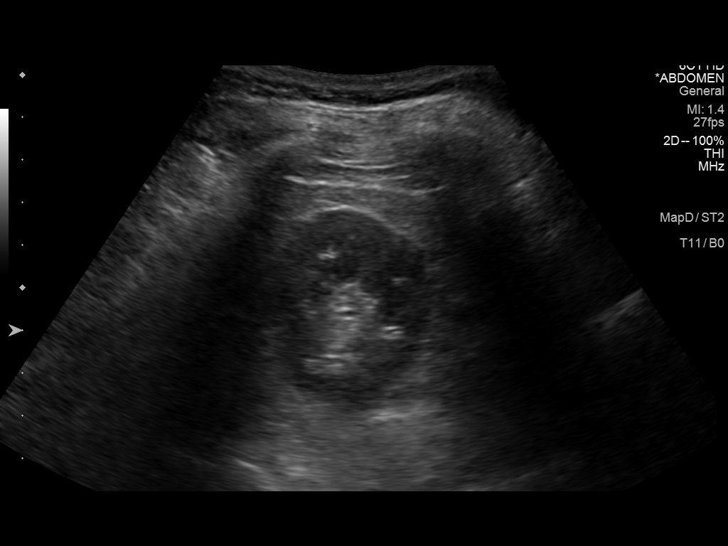
[im 85/114]
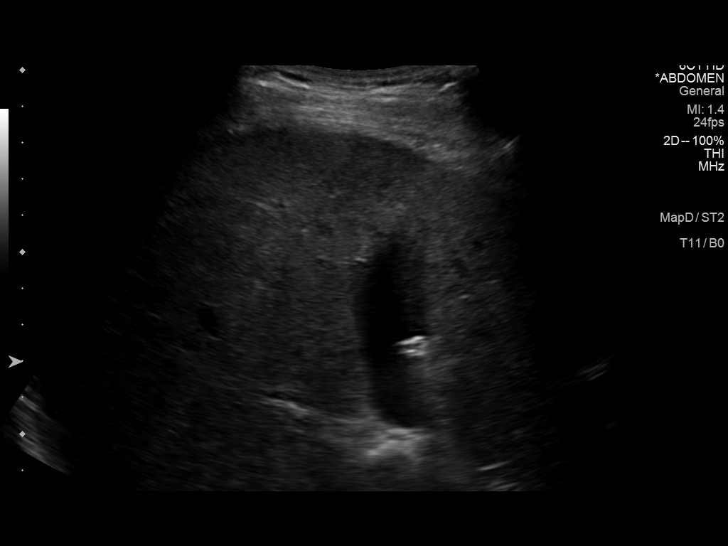
[im 95/114]
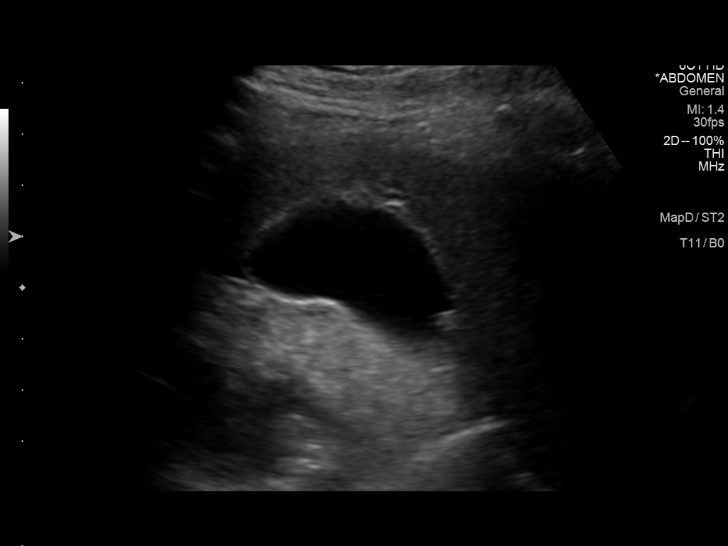
[im 104/114]
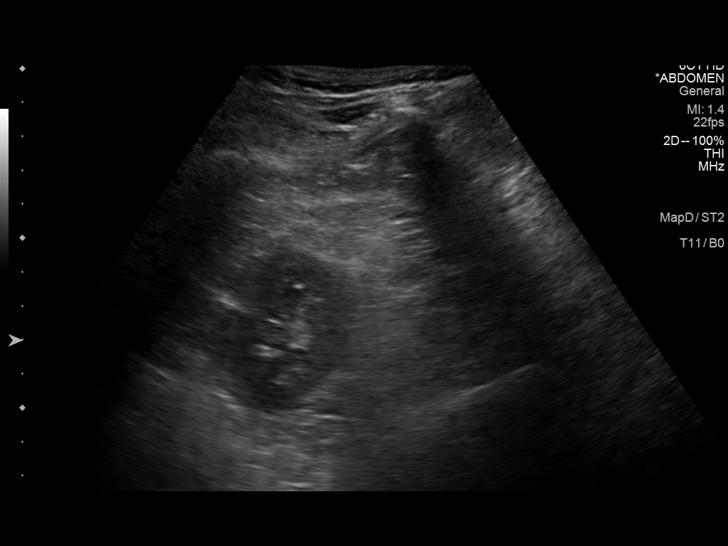
[im 114/114]
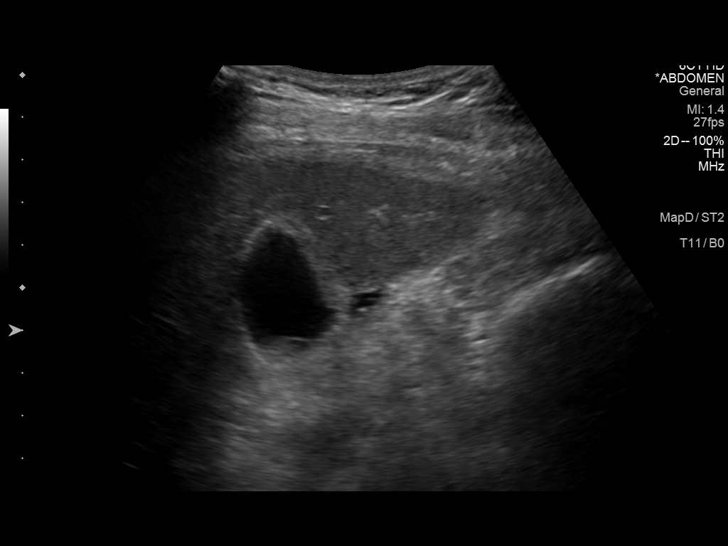

[13 of 25 positions shown; findings below may reference images not displayed]

FINDINGS: Gallbladder: Intraluminal echogenic mobile abnormalities within the
gallbladder without shadowing measure 1 cm in greatest diameter.
These likely represent non shadowing stones. Normal wall thickness
measuring 2.1 mm. No significant Murphy's sign. Small amount of free
fluid along the right liver and gallbladder in the right upper
quadrant remains nonspecific.

Common bile duct: Diameter: 2.8 mm

Liver: There are scattered small hypo echoic probable cysts
throughout the liver. Left hepatic lobe anterior lateral hypoechoic
minimally complex cyst measures 1.4 x 1.1 x 1.1 cm. Patent portal
vein with normal hepatopetal flow. No biliary dilatation.

Right hepatic lobe hypoechoic cyst along the lateral inferior margin
measures 12 x 13 x 14 mm.

IVC: Not visualized, obscured by bowel gas

Pancreas: Not visualized, obscured by bowel gas

Spleen: Limited visualization. 5.7 cm length. No definite
abnormality.

Right Kidney: Length: 10.9 cm. Echogenicity within normal limits. No
mass or hydronephrosis visualized.

Left Kidney: Length: 11.9 cm. Echogenicity within normal limits. No
mass or hydronephrosis visualized.

Abdominal aorta: No aneurysm visualized.

Other findings: None.
IMPRESSION: Probable non shadowing gallstones. No definite wall thickening or
significant Murphy's sign.

Small amount of free fluid along the right liver margin and
gallbladder remains nonspecific.

Obscured IVC, pancreas, and spleen by bowel gas.

Scattered hepatic cysts

## 2016-09-23 IMAGING — DX DG CHEST 1V PORT
1 series · 1 of 1 positions shown · non-contrast
Comparison: July 30, 2014.

CLINICAL DATA: Postoperative bile leak.  Hypertension.

EXAM:
PORTABLE CHEST - 1 VIEW

[chest ap]
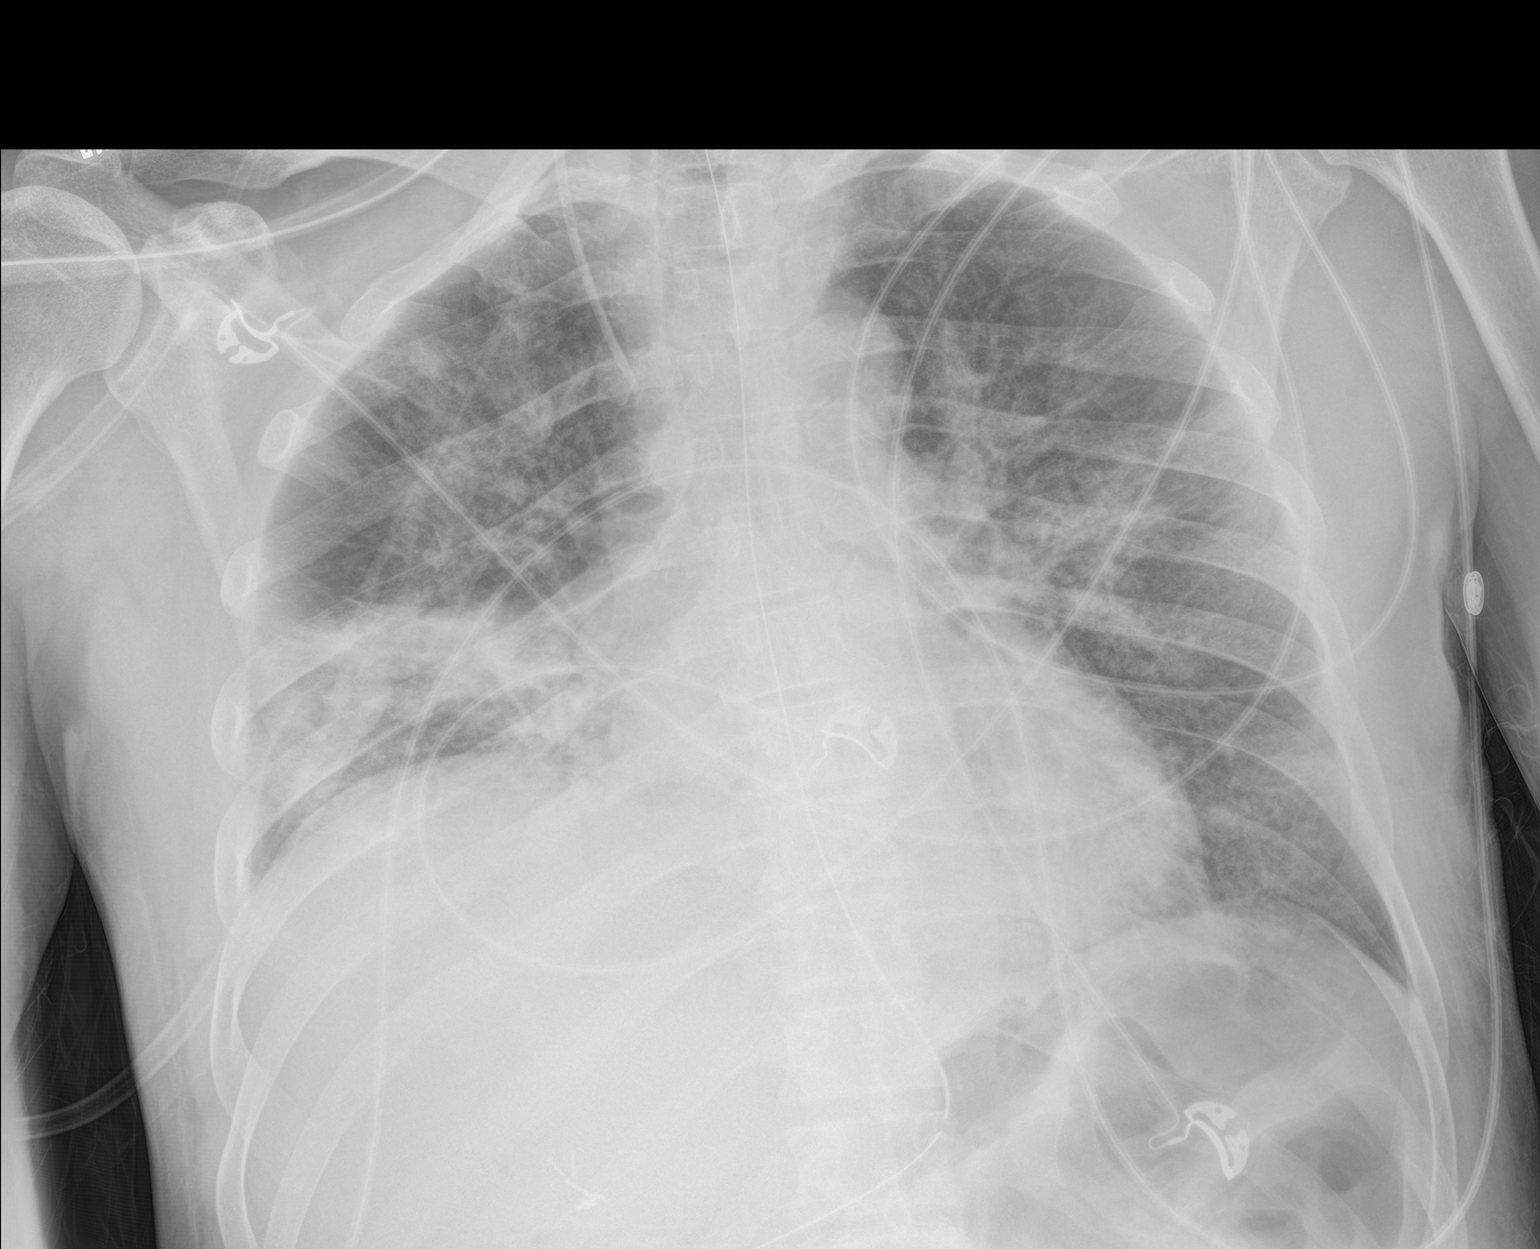

[1 of 1 positions shown; findings below may reference images not displayed]

FINDINGS: Stable cardiomediastinal silhouette. Nasogastric tube is seen
entering stomach. Right internal jugular catheter is stable in
position with tip overlying expected position of the SVC. No
pneumothorax is noted. Increased left perihilar opacity is noted
concerning for pneumonia. Significantly increased opacity is noted
in the right middle lobe concerning for pneumonia. Right upper lobe
opacity is also noted concerning for pneumonia. No significant
pleural effusion is noted. Bony thorax is intact.
IMPRESSION: Increased bilateral upper lobe and right middle lobe opacities are
noted most consistent with pneumonia or possibly edema. Followup
radiographs are recommended.

## 2016-11-06 IMAGING — RF DG ERCP WO/W SPHINCTEROTOMY
1 series · 13 of 13 positions shown · non-contrast
Comparison: Prior ERCP on 08/06/2014

CLINICAL DATA: History of biliary stent placement for bile leak
after cholecystectomy.

EXAM:
ERCP
TECHNIQUE: Multiple spot images obtained with the fluoroscopic device and
submitted for interpretation post-procedure.
FLUOROSCOPY TIME:  Fluoroscopy Time:  1 minutes and 18 seconds.

[Series 1: run · 13 of 13 slices shown]
[im 1/13]
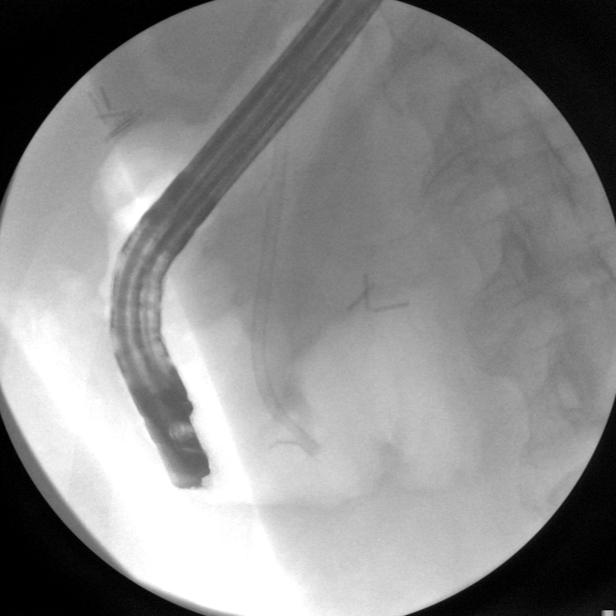
[im 2/13]
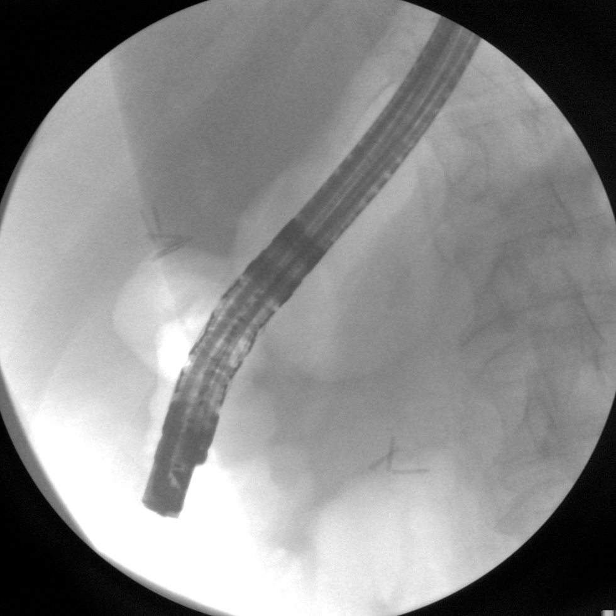
[im 3/13]
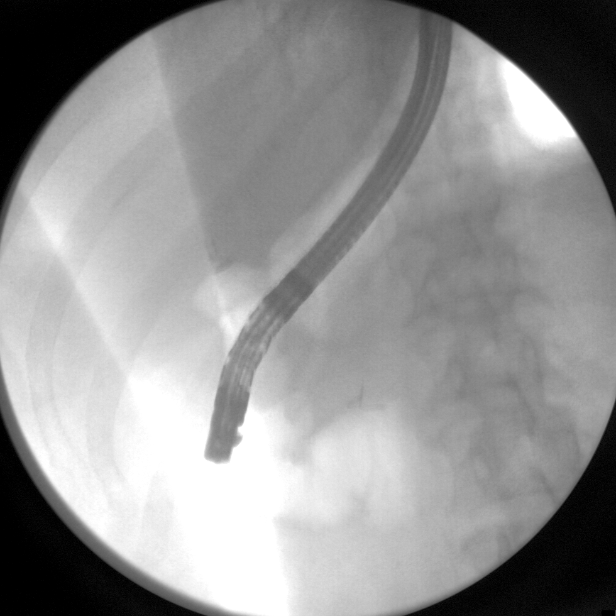
[im 4/13]
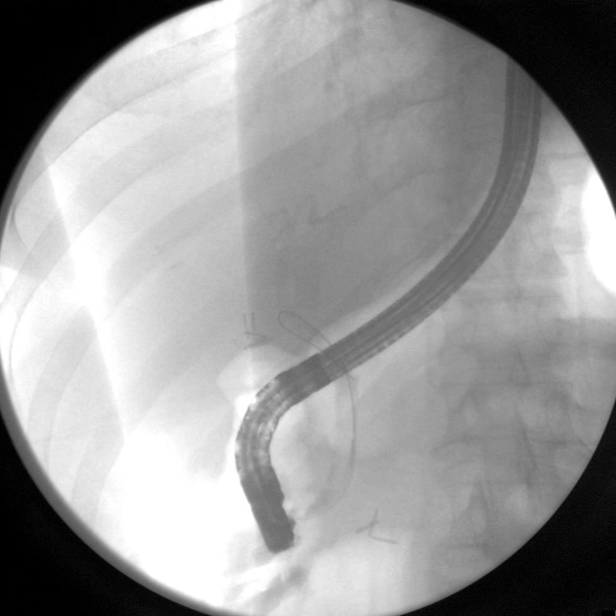
[im 5/13]
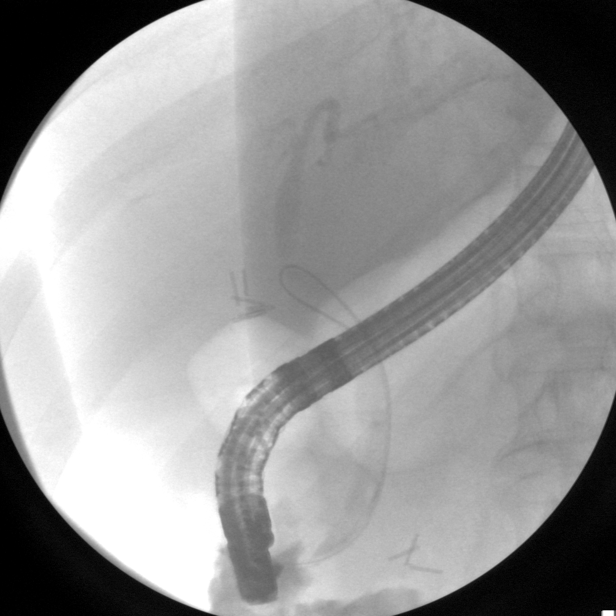
[im 6/13]
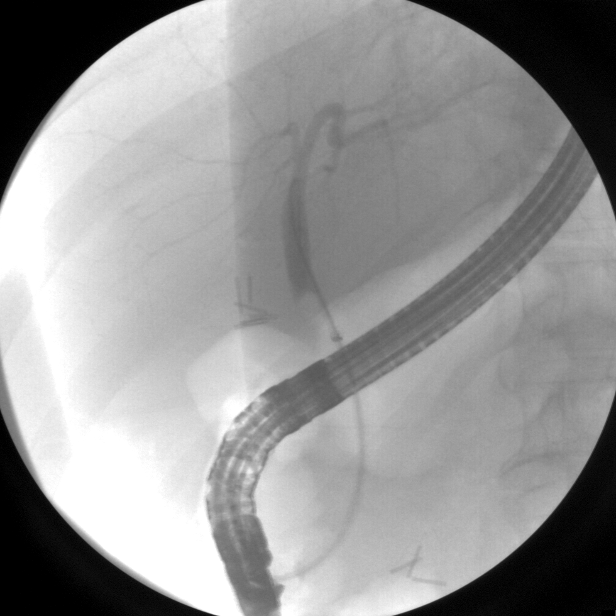
[im 7/13]
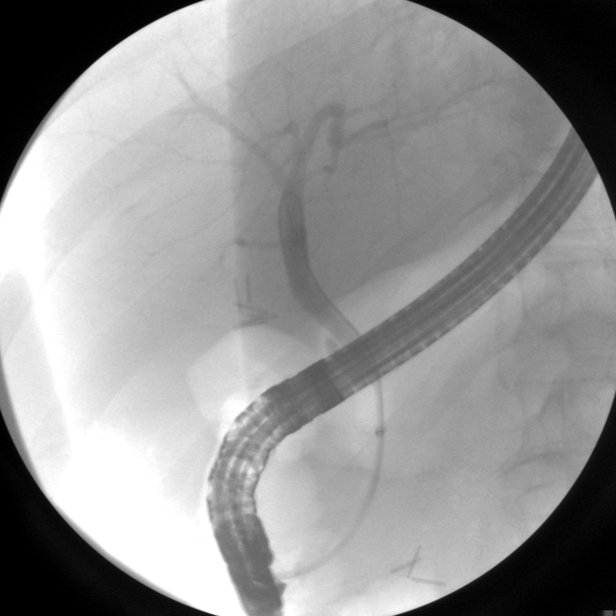
[im 8/13]
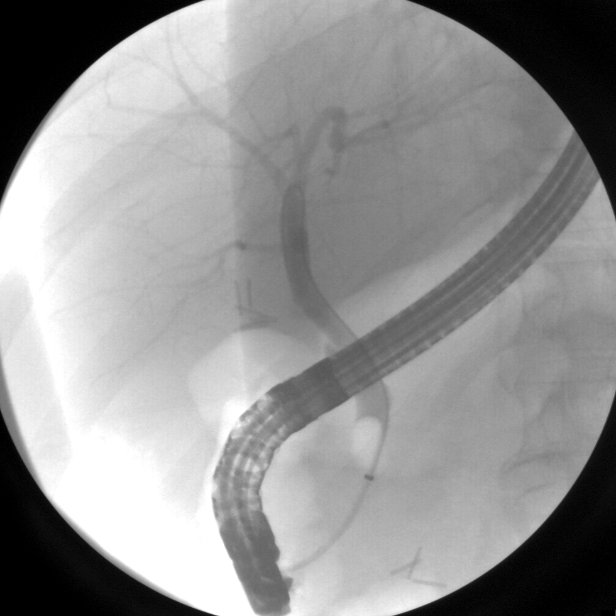
[im 9/13]
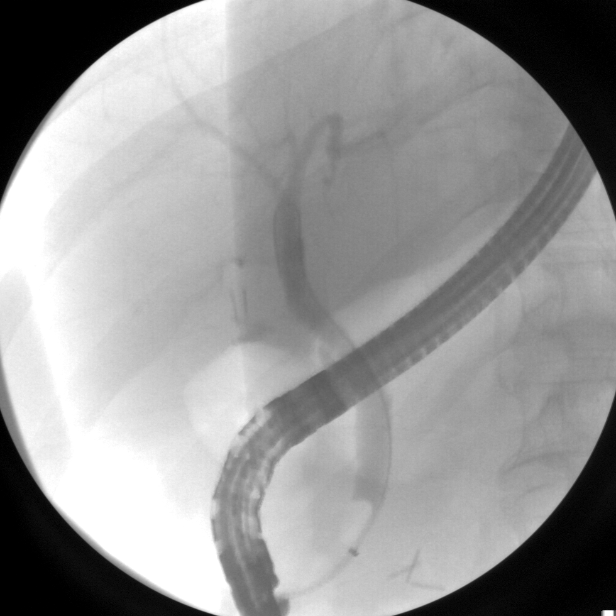
[im 10/13]
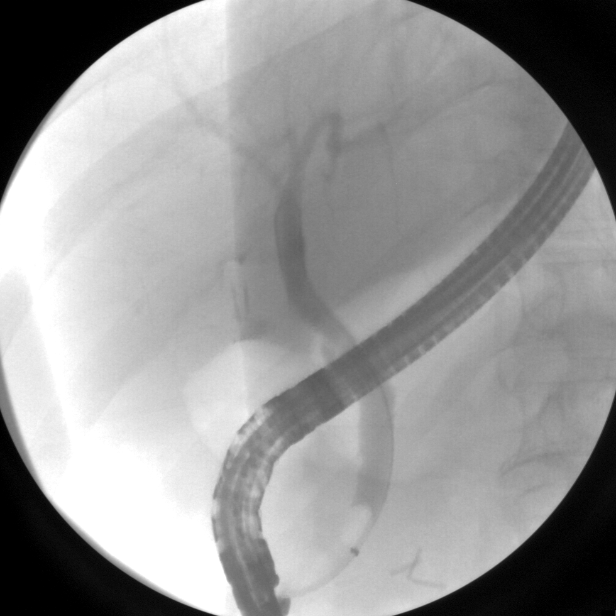
[im 11/13]
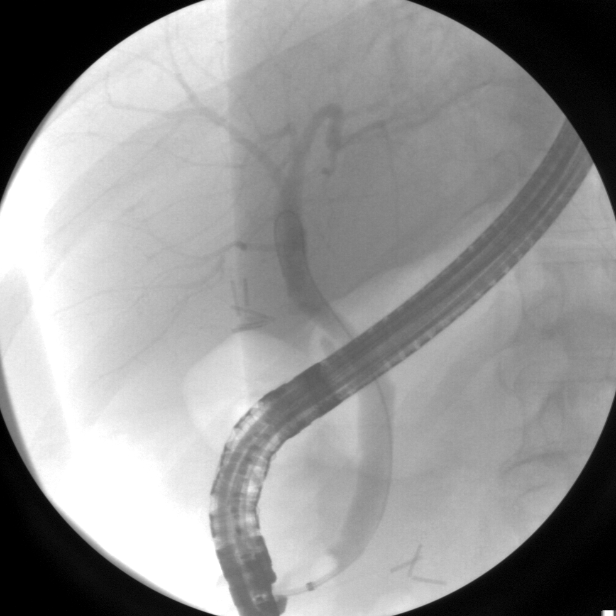
[im 12/13]
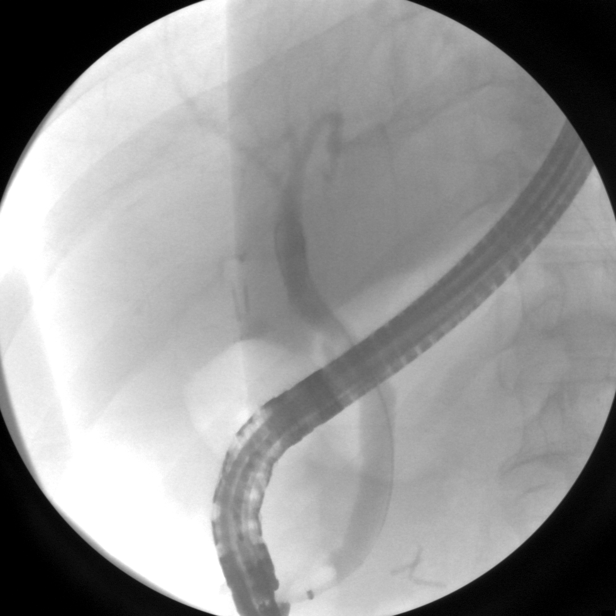
[im 13/13]
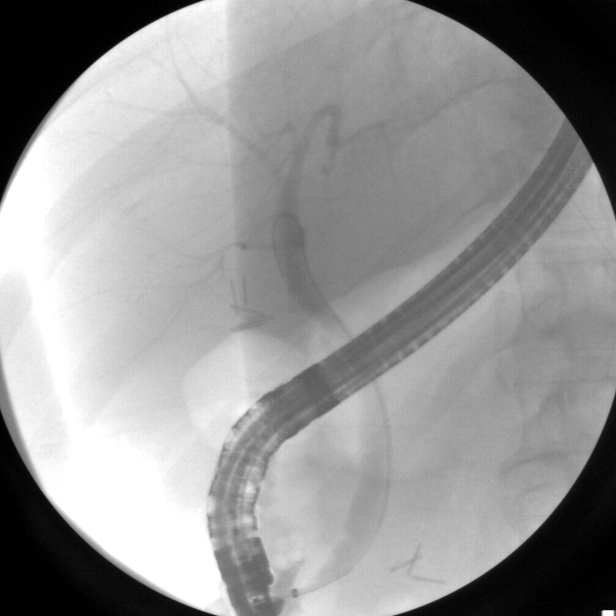

[13 of 13 positions shown; findings below may reference images not displayed]

FINDINGS: Imaging obtained with a C-arm demonstrates removal of the endoscopic
biliary stent. Contrast injection after cannulation of the ampulla
demonstrates a normal appearance to the biliary tree without
evidence of bile leak or filling defect.
IMPRESSION: Removal of biliary stent.  No evidence of bile leak.

These images were submitted for radiologic interpretation only.
Please see the procedural report for the amount of contrast and the
fluoroscopy time utilized.
